# Patient Record
Sex: Female | Born: 1950 | Race: White | Hispanic: No | Marital: Married | Smoking: Never smoker
Health system: Southern US, Community
[De-identification: ages and names within clinical notes are randomized; demographics above are authoritative.]

## PROBLEM LIST (undated history)

## (undated) DIAGNOSIS — K2270 Barrett's esophagus without dysplasia: Secondary | ICD-10-CM

## (undated) DIAGNOSIS — N200 Calculus of kidney: Secondary | ICD-10-CM

## (undated) DIAGNOSIS — F341 Dysthymic disorder: Secondary | ICD-10-CM

## (undated) DIAGNOSIS — K219 Gastro-esophageal reflux disease without esophagitis: Secondary | ICD-10-CM

## (undated) DIAGNOSIS — J8410 Pulmonary fibrosis, unspecified: Secondary | ICD-10-CM

## (undated) DIAGNOSIS — J45909 Unspecified asthma, uncomplicated: Secondary | ICD-10-CM

## (undated) DIAGNOSIS — E7800 Pure hypercholesterolemia, unspecified: Secondary | ICD-10-CM

## (undated) DIAGNOSIS — M810 Age-related osteoporosis without current pathological fracture: Secondary | ICD-10-CM

## (undated) DIAGNOSIS — I10 Essential (primary) hypertension: Secondary | ICD-10-CM

## (undated) DIAGNOSIS — K589 Irritable bowel syndrome without diarrhea: Secondary | ICD-10-CM

## (undated) DIAGNOSIS — R0600 Dyspnea, unspecified: Secondary | ICD-10-CM

## (undated) DIAGNOSIS — R0789 Other chest pain: Secondary | ICD-10-CM

## (undated) DIAGNOSIS — Z9889 Other specified postprocedural states: Secondary | ICD-10-CM

## (undated) DIAGNOSIS — F419 Anxiety disorder, unspecified: Secondary | ICD-10-CM

## (undated) DIAGNOSIS — M3500 Sicca syndrome, unspecified: Secondary | ICD-10-CM

## (undated) DIAGNOSIS — Z8719 Personal history of other diseases of the digestive system: Secondary | ICD-10-CM

## (undated) DIAGNOSIS — M8580 Other specified disorders of bone density and structure, unspecified site: Secondary | ICD-10-CM

## (undated) HISTORY — DX: Anxiety disorder, unspecified: F41.9

## (undated) HISTORY — DX: Other specified postprocedural states: Z98.890

## (undated) HISTORY — PX: CATARACT EXTRACTION: SUR2

## (undated) HISTORY — DX: Pulmonary fibrosis, unspecified: J84.10

## (undated) HISTORY — DX: Dysthymic disorder: F34.1

## (undated) HISTORY — DX: Dyspnea, unspecified: R06.00

## (undated) HISTORY — DX: Gastro-esophageal reflux disease without esophagitis: K21.9

## (undated) HISTORY — PX: FACIAL COSMETIC SURGERY: SHX629

## (undated) HISTORY — DX: Age-related osteoporosis without current pathological fracture: M81.0

## (undated) HISTORY — DX: Personal history of other diseases of the digestive system: Z87.19

## (undated) HISTORY — DX: Essential (primary) hypertension: I10

## (undated) HISTORY — DX: Barrett's esophagus without dysplasia: K22.70

## (undated) HISTORY — DX: Unspecified asthma, uncomplicated: J45.909

## (undated) HISTORY — DX: Sjogren syndrome, unspecified: M35.00

## (undated) HISTORY — DX: Pure hypercholesterolemia, unspecified: E78.00

## (undated) HISTORY — DX: Calculus of kidney: N20.0

## (undated) HISTORY — DX: Other chest pain: R07.89

## (undated) HISTORY — DX: Other specified disorders of bone density and structure, unspecified site: M85.80

## (undated) HISTORY — DX: Irritable bowel syndrome, unspecified: K58.9

---

## 1984-04-20 HISTORY — PX: TUBAL LIGATION: SHX77

## 1993-04-20 HISTORY — PX: OTHER SURGICAL HISTORY: SHX169

## 1999-01-15 ENCOUNTER — Other Ambulatory Visit: Admission: RE | Admit: 1999-01-15 | Discharge: 1999-01-15 | Payer: Self-pay | Admitting: Obstetrics and Gynecology

## 1999-01-15 ENCOUNTER — Encounter (INDEPENDENT_AMBULATORY_CARE_PROVIDER_SITE_OTHER): Payer: Self-pay

## 1999-08-29 ENCOUNTER — Other Ambulatory Visit: Admission: RE | Admit: 1999-08-29 | Discharge: 1999-08-29 | Payer: Self-pay | Admitting: Obstetrics and Gynecology

## 2000-01-16 ENCOUNTER — Other Ambulatory Visit: Admission: RE | Admit: 2000-01-16 | Discharge: 2000-01-16 | Payer: Self-pay | Admitting: Obstetrics and Gynecology

## 2000-01-16 ENCOUNTER — Encounter (INDEPENDENT_AMBULATORY_CARE_PROVIDER_SITE_OTHER): Payer: Self-pay | Admitting: Specialist

## 2000-08-31 ENCOUNTER — Other Ambulatory Visit: Admission: RE | Admit: 2000-08-31 | Discharge: 2000-08-31 | Payer: Self-pay | Admitting: Obstetrics and Gynecology

## 2001-04-29 ENCOUNTER — Encounter: Payer: Self-pay | Admitting: Internal Medicine

## 2001-09-06 ENCOUNTER — Other Ambulatory Visit: Admission: RE | Admit: 2001-09-06 | Discharge: 2001-09-06 | Payer: Self-pay | Admitting: Obstetrics and Gynecology

## 2001-12-15 ENCOUNTER — Observation Stay (HOSPITAL_COMMUNITY): Admission: EM | Admit: 2001-12-15 | Discharge: 2001-12-16 | Payer: Self-pay | Admitting: Emergency Medicine

## 2001-12-15 ENCOUNTER — Encounter: Payer: Self-pay | Admitting: Internal Medicine

## 2003-04-30 ENCOUNTER — Encounter: Payer: Self-pay | Admitting: Internal Medicine

## 2003-04-30 ENCOUNTER — Encounter (INDEPENDENT_AMBULATORY_CARE_PROVIDER_SITE_OTHER): Payer: Self-pay | Admitting: Specialist

## 2003-04-30 ENCOUNTER — Ambulatory Visit (HOSPITAL_COMMUNITY): Admission: RE | Admit: 2003-04-30 | Discharge: 2003-04-30 | Payer: Self-pay | Admitting: Internal Medicine

## 2005-03-17 ENCOUNTER — Ambulatory Visit: Payer: Self-pay | Admitting: Pulmonary Disease

## 2005-08-19 ENCOUNTER — Encounter: Payer: Self-pay | Admitting: Internal Medicine

## 2006-04-08 ENCOUNTER — Ambulatory Visit: Payer: Self-pay | Admitting: Pulmonary Disease

## 2006-11-16 ENCOUNTER — Ambulatory Visit: Payer: Self-pay | Admitting: Pulmonary Disease

## 2006-11-16 LAB — CONVERTED CEMR LAB
ALT: 18 units/L (ref 0–35)
AST: 28 units/L (ref 0–37)
Albumin: 4 g/dL (ref 3.5–5.2)
Alkaline Phosphatase: 38 units/L — ABNORMAL LOW (ref 39–117)
BUN: 14 mg/dL (ref 6–23)
Basophils Absolute: 0 10*3/uL (ref 0.0–0.1)
Basophils Relative: 0.5 % (ref 0.0–1.0)
Bilirubin, Direct: 0.1 mg/dL (ref 0.0–0.3)
CO2: 28 meq/L (ref 19–32)
Calcium: 9.3 mg/dL (ref 8.4–10.5)
Chloride: 104 meq/L (ref 96–112)
Cholesterol: 192 mg/dL (ref 0–200)
Creatinine, Ser: 0.9 mg/dL (ref 0.4–1.2)
Eosinophils Absolute: 0.1 10*3/uL (ref 0.0–0.6)
Eosinophils Relative: 1.4 % (ref 0.0–5.0)
GFR calc Af Amer: 83 mL/min
GFR calc non Af Amer: 69 mL/min
Glucose, Bld: 92 mg/dL (ref 70–99)
HCT: 37.8 % (ref 36.0–46.0)
HDL: 44.9 mg/dL (ref >39.0)
Hemoglobin: 13.2 g/dL (ref 12.0–15.0)
Iron: 104 ug/dL (ref 42–145)
LDL Cholesterol: 131 mg/dL — ABNORMAL HIGH (ref 0–99)
Lymphocytes Relative: 43.8 % (ref 12.0–46.0)
MCHC: 34.9 g/dL (ref 30.0–36.0)
MCV: 90.6 fL (ref 78.0–100.0)
Monocytes Absolute: 0.4 10*3/uL (ref 0.2–0.7)
Monocytes Relative: 9.2 % (ref 3.0–11.0)
Neutro Abs: 1.9 10*3/uL (ref 1.4–7.7)
Neutrophils Relative %: 45.1 % (ref 43.0–77.0)
Platelets: 234 10*3/uL (ref 150–400)
Potassium: 3.9 meq/L (ref 3.5–5.1)
RBC: 4.17 M/uL (ref 3.87–5.11)
RDW: 12.3 % (ref 11.5–14.6)
Saturation Ratios: 24.8 % (ref 20.0–50.0)
Sodium: 140 meq/L (ref 135–145)
TSH: 1.46 microintl units/mL (ref 0.35–5.50)
Total Bilirubin: 0.9 mg/dL (ref 0.3–1.2)
Total CHOL/HDL Ratio: 4.3
Total Protein: 7 g/dL (ref 6.0–8.3)
Transferrin: 299.6 mg/dL (ref >=212.0)
Triglycerides: 83 mg/dL (ref 0–149)
VLDL: 17 mg/dL (ref 0–40)
WBC: 4.3 10*3/uL — ABNORMAL LOW (ref 4.5–10.5)

## 2007-03-14 ENCOUNTER — Telehealth (INDEPENDENT_AMBULATORY_CARE_PROVIDER_SITE_OTHER): Payer: Self-pay | Admitting: *Deleted

## 2007-04-08 DIAGNOSIS — K219 Gastro-esophageal reflux disease without esophagitis: Secondary | ICD-10-CM | POA: Insufficient documentation

## 2007-04-08 DIAGNOSIS — K589 Irritable bowel syndrome without diarrhea: Secondary | ICD-10-CM | POA: Insufficient documentation

## 2007-04-08 DIAGNOSIS — E7800 Pure hypercholesterolemia, unspecified: Secondary | ICD-10-CM | POA: Insufficient documentation

## 2007-04-11 ENCOUNTER — Ambulatory Visit: Payer: Self-pay | Admitting: Pulmonary Disease

## 2007-04-11 DIAGNOSIS — J309 Allergic rhinitis, unspecified: Secondary | ICD-10-CM | POA: Insufficient documentation

## 2007-04-11 DIAGNOSIS — R0602 Shortness of breath: Secondary | ICD-10-CM | POA: Insufficient documentation

## 2007-04-11 DIAGNOSIS — F429 Obsessive-compulsive disorder, unspecified: Secondary | ICD-10-CM | POA: Insufficient documentation

## 2007-04-11 LAB — CONVERTED CEMR LAB: Vit D, 1,25-Dihydroxy: 42 (ref 30–89)

## 2007-04-30 DIAGNOSIS — N200 Calculus of kidney: Secondary | ICD-10-CM | POA: Insufficient documentation

## 2007-04-30 LAB — CONVERTED CEMR LAB
ALT: 15 units/L (ref 0–35)
AST: 22 units/L (ref 0–37)
Albumin: 4 g/dL (ref 3.5–5.2)
Alkaline Phosphatase: 37 units/L — ABNORMAL LOW (ref 39–117)
BUN: 16 mg/dL (ref 6–23)
Basophils Absolute: 0 10*3/uL (ref 0.0–0.1)
Basophils Relative: 0.2 % (ref 0.0–1.0)
Bilirubin, Direct: 0.1 mg/dL (ref 0.0–0.3)
CO2: 31 meq/L (ref 19–32)
Calcium: 9.6 mg/dL (ref 8.4–10.5)
Chloride: 101 meq/L (ref 96–112)
Cholesterol: 176 mg/dL (ref 0–200)
Creatinine, Ser: 0.8 mg/dL (ref 0.4–1.2)
Eosinophils Absolute: 0.1 10*3/uL (ref 0.0–0.6)
Eosinophils Relative: 1.2 % (ref 0.0–5.0)
GFR calc Af Amer: 95 mL/min
GFR calc non Af Amer: 79 mL/min
Glucose, Bld: 106 mg/dL — ABNORMAL HIGH (ref 70–99)
HCT: 37.4 % (ref 36.0–46.0)
HDL: 56.6 mg/dL (ref >39.0)
Hemoglobin: 13 g/dL (ref 12.0–15.0)
LDL Cholesterol: 100 mg/dL — ABNORMAL HIGH (ref 0–99)
Lymphocytes Relative: 46.7 % — ABNORMAL HIGH (ref 12.0–46.0)
MCHC: 34.7 g/dL (ref 30.0–36.0)
MCV: 90.9 fL (ref 78.0–100.0)
Monocytes Absolute: 0.4 10*3/uL (ref 0.2–0.7)
Monocytes Relative: 8.5 % (ref 3.0–11.0)
Neutro Abs: 1.8 10*3/uL (ref 1.4–7.7)
Neutrophils Relative %: 43.4 % (ref 43.0–77.0)
Platelets: 254 10*3/uL (ref 150–400)
Potassium: 4 meq/L (ref 3.5–5.1)
RBC: 4.11 M/uL (ref 3.87–5.11)
RDW: 12.5 % (ref 11.5–14.6)
Sodium: 139 meq/L (ref 135–145)
TSH: 1.25 microintl units/mL (ref 0.35–5.50)
Total Bilirubin: 0.6 mg/dL (ref 0.3–1.2)
Total CHOL/HDL Ratio: 3.1
Total Protein: 7.5 g/dL (ref 6.0–8.3)
Triglycerides: 95 mg/dL (ref 0–149)
VLDL: 19 mg/dL (ref 0–40)
WBC: 4.2 10*3/uL — ABNORMAL LOW (ref 4.5–10.5)

## 2007-09-05 ENCOUNTER — Telehealth (INDEPENDENT_AMBULATORY_CARE_PROVIDER_SITE_OTHER): Payer: Self-pay | Admitting: *Deleted

## 2008-02-13 ENCOUNTER — Telehealth: Payer: Self-pay | Admitting: Internal Medicine

## 2008-02-14 ENCOUNTER — Ambulatory Visit: Payer: Self-pay | Admitting: Internal Medicine

## 2008-02-14 DIAGNOSIS — K5909 Other constipation: Secondary | ICD-10-CM | POA: Insufficient documentation

## 2008-02-14 DIAGNOSIS — K644 Residual hemorrhoidal skin tags: Secondary | ICD-10-CM | POA: Insufficient documentation

## 2008-02-14 DIAGNOSIS — R110 Nausea: Secondary | ICD-10-CM | POA: Insufficient documentation

## 2008-02-14 DIAGNOSIS — R1011 Right upper quadrant pain: Secondary | ICD-10-CM | POA: Insufficient documentation

## 2008-02-14 DIAGNOSIS — K209 Esophagitis, unspecified without bleeding: Secondary | ICD-10-CM | POA: Insufficient documentation

## 2008-02-14 LAB — CONVERTED CEMR LAB
ALT: 16 units/L (ref 0–35)
AST: 26 units/L (ref 0–37)
Albumin: 3.9 g/dL (ref 3.5–5.2)
Alkaline Phosphatase: 45 units/L (ref 39–117)
BUN: 20 mg/dL (ref 6–23)
Basophils Absolute: 0 10*3/uL (ref 0.0–0.1)
Basophils Relative: 0.2 % (ref 0.0–3.0)
CO2: 29 meq/L (ref 19–32)
Calcium: 8.8 mg/dL (ref 8.4–10.5)
Chloride: 106 meq/L (ref 96–112)
Creatinine, Ser: 0.8 mg/dL (ref 0.4–1.2)
Eosinophils Absolute: 0.1 10*3/uL (ref 0.0–0.7)
Eosinophils Relative: 2.5 % (ref 0.0–5.0)
GFR calc Af Amer: 95 mL/min
GFR calc non Af Amer: 79 mL/min
Glucose, Bld: 99 mg/dL (ref 70–99)
HCT: 38.1 % (ref 36.0–46.0)
Hemoglobin: 12.9 g/dL (ref 12.0–15.0)
Lymphocytes Relative: 41.8 % (ref 12.0–46.0)
MCHC: 34 g/dL (ref 30.0–36.0)
MCV: 94 fL (ref 78.0–100.0)
Monocytes Absolute: 0.4 10*3/uL (ref 0.1–1.0)
Monocytes Relative: 10.5 % (ref 3.0–12.0)
Neutro Abs: 1.7 10*3/uL (ref 1.4–7.7)
Neutrophils Relative %: 45 % (ref 43.0–77.0)
Platelets: 258 10*3/uL (ref 150–400)
Potassium: 4 meq/L (ref 3.5–5.1)
RBC: 4.06 M/uL (ref 3.87–5.11)
RDW: 11.9 % (ref 11.5–14.6)
Sodium: 140 meq/L (ref 135–145)
Total Bilirubin: 0.6 mg/dL (ref 0.3–1.2)
Total Protein: 7.2 g/dL (ref 6.0–8.3)
WBC: 3.8 10*3/uL — ABNORMAL LOW (ref 4.5–10.5)

## 2008-02-15 ENCOUNTER — Telehealth (INDEPENDENT_AMBULATORY_CARE_PROVIDER_SITE_OTHER): Payer: Self-pay | Admitting: *Deleted

## 2008-02-15 ENCOUNTER — Ambulatory Visit (HOSPITAL_COMMUNITY): Admission: RE | Admit: 2008-02-15 | Discharge: 2008-02-15 | Payer: Self-pay | Admitting: Internal Medicine

## 2008-02-16 DIAGNOSIS — R109 Unspecified abdominal pain: Secondary | ICD-10-CM | POA: Insufficient documentation

## 2008-02-17 ENCOUNTER — Ambulatory Visit: Payer: Self-pay | Admitting: Internal Medicine

## 2008-02-21 DIAGNOSIS — Z8719 Personal history of other diseases of the digestive system: Secondary | ICD-10-CM | POA: Insufficient documentation

## 2008-02-27 ENCOUNTER — Ambulatory Visit: Payer: Self-pay | Admitting: Internal Medicine

## 2008-02-28 ENCOUNTER — Encounter: Payer: Self-pay | Admitting: Internal Medicine

## 2008-02-28 ENCOUNTER — Ambulatory Visit: Payer: Self-pay | Admitting: Internal Medicine

## 2008-03-01 ENCOUNTER — Encounter: Payer: Self-pay | Admitting: Internal Medicine

## 2008-04-11 ENCOUNTER — Ambulatory Visit: Payer: Self-pay | Admitting: Internal Medicine

## 2008-04-11 LAB — CONVERTED CEMR LAB
Albumin: 4.2 g/dL (ref 3.5–5.2)
BUN: 20 mg/dL (ref 6–23)
Basophils Absolute: 0 10*3/uL (ref 0.0–0.1)
Basophils Relative: 0 % (ref 0.0–3.0)
CO2: 31 meq/L (ref 19–32)
Calcium: 9.4 mg/dL (ref 8.4–10.5)
Chloride: 105 meq/L (ref 96–112)
Creatinine, Ser: 0.7 mg/dL (ref 0.4–1.2)
Eosinophils Absolute: 0 10*3/uL (ref 0.0–0.7)
Eosinophils Relative: 0.8 % (ref 0.0–5.0)
GFR calc Af Amer: 111 mL/min
GFR calc non Af Amer: 91 mL/min
Glucose, Bld: 93 mg/dL (ref 70–99)
HCT: 36.7 % (ref 36.0–46.0)
Hemoglobin: 12.5 g/dL (ref 12.0–15.0)
Lymphocytes Relative: 49.5 % — ABNORMAL HIGH (ref 12.0–46.0)
MCHC: 34.2 g/dL (ref 30.0–36.0)
MCV: 92.5 fL (ref 78.0–100.0)
Monocytes Absolute: 0.4 10*3/uL (ref 0.1–1.0)
Monocytes Relative: 9.6 % (ref 3.0–12.0)
Neutro Abs: 1.7 10*3/uL (ref 1.4–7.7)
Neutrophils Relative %: 40.1 % — ABNORMAL LOW (ref 43.0–77.0)
Phosphorus: 3.7 mg/dL (ref 2.3–4.6)
Platelets: 205 10*3/uL (ref 150–400)
Potassium: 4.2 meq/L (ref 3.5–5.1)
RBC: 3.97 M/uL (ref 3.87–5.11)
RDW: 12.5 % (ref 11.5–14.6)
Sodium: 140 meq/L (ref 135–145)
WBC: 4.3 10*3/uL — ABNORMAL LOW (ref 4.5–10.5)

## 2008-04-16 ENCOUNTER — Ambulatory Visit: Payer: Self-pay | Admitting: Cardiology

## 2008-06-04 ENCOUNTER — Ambulatory Visit: Payer: Self-pay | Admitting: Internal Medicine

## 2008-06-07 ENCOUNTER — Ambulatory Visit: Payer: Self-pay | Admitting: Internal Medicine

## 2008-06-07 ENCOUNTER — Encounter: Payer: Self-pay | Admitting: Internal Medicine

## 2008-06-08 ENCOUNTER — Encounter: Payer: Self-pay | Admitting: Internal Medicine

## 2008-06-08 ENCOUNTER — Telehealth: Payer: Self-pay | Admitting: Internal Medicine

## 2008-08-10 ENCOUNTER — Ambulatory Visit: Payer: Self-pay | Admitting: Cardiology

## 2008-08-10 ENCOUNTER — Inpatient Hospital Stay (HOSPITAL_COMMUNITY): Admission: EM | Admit: 2008-08-10 | Discharge: 2008-08-12 | Payer: Self-pay | Admitting: Emergency Medicine

## 2008-08-13 ENCOUNTER — Telehealth: Payer: Self-pay | Admitting: Cardiology

## 2008-08-13 ENCOUNTER — Telehealth (INDEPENDENT_AMBULATORY_CARE_PROVIDER_SITE_OTHER): Payer: Self-pay | Admitting: *Deleted

## 2008-08-13 ENCOUNTER — Ambulatory Visit: Payer: Self-pay | Admitting: Pulmonary Disease

## 2008-08-14 ENCOUNTER — Ambulatory Visit: Payer: Self-pay

## 2008-08-14 ENCOUNTER — Encounter: Payer: Self-pay | Admitting: Cardiology

## 2008-08-14 DIAGNOSIS — R42 Dizziness and giddiness: Secondary | ICD-10-CM | POA: Insufficient documentation

## 2008-08-20 ENCOUNTER — Telehealth (INDEPENDENT_AMBULATORY_CARE_PROVIDER_SITE_OTHER): Payer: Self-pay | Admitting: *Deleted

## 2008-08-29 ENCOUNTER — Encounter: Payer: Self-pay | Admitting: Pulmonary Disease

## 2008-09-07 ENCOUNTER — Encounter: Payer: Self-pay | Admitting: Cardiology

## 2008-09-14 ENCOUNTER — Telehealth: Payer: Self-pay | Admitting: Internal Medicine

## 2008-09-26 ENCOUNTER — Encounter (INDEPENDENT_AMBULATORY_CARE_PROVIDER_SITE_OTHER): Payer: Self-pay | Admitting: *Deleted

## 2008-10-08 ENCOUNTER — Ambulatory Visit: Payer: Self-pay | Admitting: Internal Medicine

## 2008-10-08 DIAGNOSIS — K2270 Barrett's esophagus without dysplasia: Secondary | ICD-10-CM | POA: Insufficient documentation

## 2008-10-11 ENCOUNTER — Ambulatory Visit (HOSPITAL_COMMUNITY): Admission: RE | Admit: 2008-10-11 | Discharge: 2008-10-11 | Payer: Self-pay | Admitting: Internal Medicine

## 2008-10-16 ENCOUNTER — Encounter: Payer: Self-pay | Admitting: Pulmonary Disease

## 2009-02-04 ENCOUNTER — Telehealth (INDEPENDENT_AMBULATORY_CARE_PROVIDER_SITE_OTHER): Payer: Self-pay | Admitting: *Deleted

## 2009-03-11 ENCOUNTER — Encounter: Payer: Self-pay | Admitting: Pulmonary Disease

## 2009-08-07 ENCOUNTER — Telehealth: Payer: Self-pay | Admitting: Internal Medicine

## 2009-08-07 ENCOUNTER — Ambulatory Visit: Payer: Self-pay | Admitting: Gastroenterology

## 2009-08-07 DIAGNOSIS — K5289 Other specified noninfective gastroenteritis and colitis: Secondary | ICD-10-CM | POA: Insufficient documentation

## 2009-08-07 DIAGNOSIS — K529 Noninfective gastroenteritis and colitis, unspecified: Secondary | ICD-10-CM | POA: Insufficient documentation

## 2009-08-07 DIAGNOSIS — K625 Hemorrhage of anus and rectum: Secondary | ICD-10-CM | POA: Insufficient documentation

## 2009-08-07 DIAGNOSIS — K648 Other hemorrhoids: Secondary | ICD-10-CM | POA: Insufficient documentation

## 2009-08-07 LAB — CONVERTED CEMR LAB
ALT: 24 units/L (ref 0–35)
AST: 30 units/L (ref 0–37)
Albumin: 4.1 g/dL (ref 3.5–5.2)
Alkaline Phosphatase: 47 units/L (ref 39–117)
BUN: 14 mg/dL (ref 6–23)
Basophils Absolute: 0 10*3/uL (ref 0.0–0.1)
Basophils Relative: 0.1 % (ref 0.0–3.0)
CO2: 30 meq/L (ref 19–32)
CRP, High Sensitivity: 2.9 (ref 0.00–5.00)
Calcium: 9.2 mg/dL (ref 8.4–10.5)
Chloride: 104 meq/L (ref 96–112)
Creatinine, Ser: 0.8 mg/dL (ref 0.4–1.2)
Eosinophils Absolute: 0.1 10*3/uL (ref 0.0–0.7)
Eosinophils Relative: 1 % (ref 0.0–5.0)
GFR calc non Af Amer: 77.93 mL/min (ref >60)
Glucose, Bld: 83 mg/dL (ref 70–99)
HCT: 35.9 % — ABNORMAL LOW (ref 36.0–46.0)
Hemoglobin: 12.4 g/dL (ref 12.0–15.0)
Lymphocytes Relative: 44.1 % (ref 12.0–46.0)
Lymphs Abs: 2.4 10*3/uL (ref 0.7–4.0)
MCHC: 34.4 g/dL (ref 30.0–36.0)
MCV: 91 fL (ref 78.0–100.0)
Monocytes Absolute: 0.5 10*3/uL (ref 0.1–1.0)
Monocytes Relative: 9.1 % (ref 3.0–12.0)
Neutro Abs: 2.5 10*3/uL (ref 1.4–7.7)
Neutrophils Relative %: 45.7 % (ref 43.0–77.0)
Platelets: 249 10*3/uL (ref 150.0–400.0)
Potassium: 3.8 meq/L (ref 3.5–5.1)
RBC: 3.94 M/uL (ref 3.87–5.11)
RDW: 13.1 % (ref 11.5–14.6)
Sed Rate: 24 mm/hr — ABNORMAL HIGH (ref 0–22)
Sodium: 142 meq/L (ref 135–145)
Total Bilirubin: 0.4 mg/dL (ref 0.3–1.2)
Total Protein: 7.1 g/dL (ref 6.0–8.3)
WBC: 5.4 10*3/uL (ref 4.5–10.5)

## 2009-09-10 ENCOUNTER — Telehealth (INDEPENDENT_AMBULATORY_CARE_PROVIDER_SITE_OTHER): Payer: Self-pay | Admitting: *Deleted

## 2009-12-19 ENCOUNTER — Ambulatory Visit: Payer: Self-pay | Admitting: Pulmonary Disease

## 2009-12-22 DIAGNOSIS — R0789 Other chest pain: Secondary | ICD-10-CM | POA: Insufficient documentation

## 2009-12-26 LAB — CONVERTED CEMR LAB
ALT: 21 units/L (ref 0–35)
AST: 25 units/L (ref 0–37)
Albumin: 4.2 g/dL (ref 3.5–5.2)
Alkaline Phosphatase: 48 units/L (ref 39–117)
BUN: 16 mg/dL (ref 6–23)
Basophils Absolute: 0 10*3/uL (ref 0.0–0.1)
Basophils Relative: 0.2 % (ref 0.0–3.0)
Bilirubin Urine: NEGATIVE
Bilirubin, Direct: 0.1 mg/dL (ref 0.0–0.3)
CO2: 28 meq/L (ref 19–32)
Calcium: 9.4 mg/dL (ref 8.4–10.5)
Chloride: 104 meq/L (ref 96–112)
Cholesterol: 201 mg/dL — ABNORMAL HIGH (ref 0–200)
Creatinine, Ser: 0.8 mg/dL (ref 0.4–1.2)
Direct LDL: 140.2 mg/dL
Eosinophils Absolute: 0.1 10*3/uL (ref 0.0–0.7)
Eosinophils Relative: 1.6 % (ref 0.0–5.0)
GFR calc non Af Amer: 82.58 mL/min (ref >60)
Glucose, Bld: 88 mg/dL (ref 70–99)
HCT: 37 % (ref 36.0–46.0)
HDL: 49.3 mg/dL (ref >39.00)
Hemoglobin: 12.8 g/dL (ref 12.0–15.0)
Ketones, ur: NEGATIVE mg/dL
Leukocytes, UA: NEGATIVE
Lymphocytes Relative: 48.2 % — ABNORMAL HIGH (ref 12.0–46.0)
Lymphs Abs: 1.8 10*3/uL (ref 0.7–4.0)
MCHC: 34.7 g/dL (ref 30.0–36.0)
MCV: 92.3 fL (ref 78.0–100.0)
Monocytes Absolute: 0.3 10*3/uL (ref 0.1–1.0)
Monocytes Relative: 9.3 % (ref 3.0–12.0)
Neutro Abs: 1.5 10*3/uL (ref 1.4–7.7)
Neutrophils Relative %: 40.7 % — ABNORMAL LOW (ref 43.0–77.0)
Nitrite: NEGATIVE
Platelets: 219 10*3/uL (ref 150.0–400.0)
Potassium: 3.9 meq/L (ref 3.5–5.1)
RBC: 4 M/uL (ref 3.87–5.11)
RDW: 13.5 % (ref 11.5–14.6)
Sodium: 140 meq/L (ref 135–145)
Specific Gravity, Urine: 1.02 (ref 1.000–1.030)
TSH: 1.72 microintl units/mL (ref 0.35–5.50)
Total Bilirubin: 0.7 mg/dL (ref 0.3–1.2)
Total CHOL/HDL Ratio: 4
Total Protein, Urine: NEGATIVE mg/dL
Total Protein: 7.1 g/dL (ref 6.0–8.3)
Triglycerides: 107 mg/dL (ref 0.0–149.0)
Urine Glucose: NEGATIVE mg/dL
Urobilinogen, UA: 0.2 (ref 0.0–1.0)
VLDL: 21.4 mg/dL (ref 0.0–40.0)
WBC: 3.7 10*3/uL — ABNORMAL LOW (ref 4.5–10.5)
pH: 6 (ref 5.0–8.0)

## 2009-12-27 ENCOUNTER — Telehealth (INDEPENDENT_AMBULATORY_CARE_PROVIDER_SITE_OTHER): Payer: Self-pay | Admitting: *Deleted

## 2010-01-07 ENCOUNTER — Telehealth: Payer: Self-pay | Admitting: Internal Medicine

## 2010-02-03 ENCOUNTER — Encounter: Payer: Self-pay | Admitting: Pulmonary Disease

## 2010-02-10 ENCOUNTER — Encounter (INDEPENDENT_AMBULATORY_CARE_PROVIDER_SITE_OTHER): Payer: Self-pay

## 2010-02-12 ENCOUNTER — Ambulatory Visit: Payer: Self-pay | Admitting: Internal Medicine

## 2010-02-19 ENCOUNTER — Ambulatory Visit: Payer: Self-pay | Admitting: Internal Medicine

## 2010-02-20 ENCOUNTER — Encounter: Payer: Self-pay | Admitting: Internal Medicine

## 2010-04-09 ENCOUNTER — Telehealth: Payer: Self-pay | Admitting: Pulmonary Disease

## 2010-04-16 ENCOUNTER — Encounter: Payer: Self-pay | Admitting: Pulmonary Disease

## 2010-05-20 NOTE — Assessment & Plan Note (Signed)
Summary: Cardiology Nuclear Testing  Nuclear Med Background Indications for Stress Test: Evaluation for Ischemia, Post Hospital  Indications Comments: 08/10/08 chest pain/pressure;MI r/o  History: GXT  History Comments: '02 GXT:(-)  Symptoms: Chest Pressure, Chest Pressure with Exertion, Diaphoresis, Dizziness, Fatigue, Fatigue with Exertion, Light-Headedness, Nausea, Palpitations, Rapid HR  Symptoms Comments: anxiety   Nuclear Pre-Procedure Cardiac Risk Factors: Family History - CAD, Lipids Caffeine/Decaff Intake: none NPO After: 8:30 PM Lungs: clear IV 0.9% NS with Angio Cath: 22g     IV Site: (R) hand IV Started by: Irean Hong RN Chest Size (in) 36     Cup Size B     Height (in): 64 Weight (lb): 125 BMI: 21.53  Nuclear Med Study 1 or 2 day study:  1 day     Stress Test Type:  Adenosine Reading MD:  Charlton Haws, MD     Referring MD:  Olga Millers, MD Resting Radionuclide:  Technetium 4m Tetrofosmin     Resting Radionuclide Dose:  11.0 mCi  Stress Radionuclide:  Technetium 77m Tetrofosmin     Stress Radionuclide Dose:  33.0 mCi   Stress Protocol      Max HR:  89 bpm Max Systolic BP: 112 mm HgRate Pressure Product:  9968 Dose of Adenosine:  31.8 mg    Stress Test Technologist:  Rea [employer] CMA-N     Nuclear Technologist:  Domenic Polite CNMT  Rest Procedure  Myocardial perfusion imaging was performed at rest 45 minutes following the intraveneous administration of Myoview Technetium 72m Tetrofosmin.  Stress Procedure  The patient received IV adenosine at 140 mcg/kg/min for 4 minutes. There was a brief episode of 2nd degree AVB with infusion. She did c/o chest tightness with infusion.  Myoview was injected at the 2 minute mark and quantitative spect images were obtained after a 45 minute delay.  QPS Raw Data Images:  Normal; no motion artifact; normal heart/lung ratio. Stress Images:  NI: Uniform and normal uptake of tracer in all myocardial segments. Rest  Images:  Normal homogeneous uptake in all areas of the myocardium. Subtraction (SDS):  Normal Transient Ischemic Dilatation:  .95  (Normal <1.22)  Lung/Heart Ratio:  .25  (Normal <0.45)  Quantitative Gated Spect Images QGS EDV:  45 ml QGS ESV:  17 ml QGS EF:  64 % QGS cine images:  Normal  Findings Normal nuclear study      Overall Impression  Exercise Capacity: Adenosine study with no exercise. BP Response: Normal blood pressure response. Clinical Symptoms: Throat tightness ECG Impression: No significant ST segment change suggestive of ischemia. Overall Impression: Normal stress nuclear study. Overall Impression Comments: Normal

## 2010-05-20 NOTE — Miscellaneous (Signed)
Summary: New RX  Clinical Lists Changes  Medications: Added new medication of AMITIZA 8 MCG  CAPS (LUBIPROSTONE) 1 two times a day/take with food and water - Signed Rx of AMITIZA 8 MCG  CAPS (LUBIPROSTONE) 1 two times a day/take with food and water;  #60 x 1;  Signed;  Entered by: Burman Foster RN;  Authorized by: Hart Carwin MD;  Method used: Electronically to [facility] [address] [place]. [phone]*, [address], [place], [place], [place]  [address], Ph: [phone] or [phone], Fax: [phone]    Prescriptions: AMITIZA 8 MCG  CAPS (LUBIPROSTONE) 1 two times a day/take with food and water  #60 x 1   Entered by:   Burman Foster RN   Authorized by:   Hart Carwin MD   Signed by:   Burman Foster RN on 06/07/2008   Method used:   Electronically to        [facility] [address] [phone]* (retail)       [address]       [place], [place]  [address]       Ph: [phone] or [phone]       Fax: [phone]   RxID:   [id number]

## 2010-05-20 NOTE — Miscellaneous (Signed)
Summary: [facility]  PREVISIT/PREP  Clinical Lists Changes  Medications: Added new medication of DULCOLAX 5 MG  TBEC (BISACODYL) Day before procedure take 2 at 3pm and 2 at 8pm. - Signed Added new medication of METOCLOPRAMIDE HCL 10 MG  TABS (METOCLOPRAMIDE HCL) As per prep instructions. - Signed Added new medication of MIRALAX   POWD (POLYETHYLENE GLYCOL 3350) As per prep  instructions. - Signed Rx of DULCOLAX 5 MG  TBEC (BISACODYL) Day before procedure take 2 at 3pm and 2 at 8pm.;  #4 x 0;  Signed;  Entered by: Jennye Boroughs RN;  Authorized by: Hart Carwin MD;  Method used: Electronically to [facility] [address] [place]. [phone]*, [address], [place], [place], [place]  [address], Ph: [phone] or [phone], Fax: [phone] Rx of METOCLOPRAMIDE HCL 10 MG  TABS (METOCLOPRAMIDE HCL) As per prep instructions.;  #2 x 0;  Signed;  Entered by: Jennye Boroughs RN;  Authorized by: Hart Carwin MD;  Method used: Electronically to [facility] [address] [place]. [phone]*, [address] [place], [place], [place]  [address], Ph: [phone] or [phone], Fax: [phone] Rx of MIRALAX   POWD (POLYETHYLENE GLYCOL 3350) As per prep  instructions.;  #255gm x 0;  Signed;  Entered by: Jennye Boroughs RN;  Authorized by: Hart Carwin MD;  Method used: Electronically to [facility] [address] [place]. [phone]*, [address] [place], [place], [place]  [address], Ph: [phone] or [phone], Fax: [phone]    Prescriptions: MIRALAX   POWD (POLYETHYLENE GLYCOL 3350) As per prep  instructions.  #255gm x 0   Entered by:   Jennye Boroughs RN   Authorized by:   Hart Carwin MD   Signed by:   Jennye Boroughs RN on 06/04/2008   Method used:   Electronically to        [facility]. [address] [phone]* (retail)       [address]       [place], [place]  [address]       Ph: [phone] or [phone]       Fax: [phone]   RxID:   [id number] METOCLOPRAMIDE HCL 10 MG   TABS (METOCLOPRAMIDE HCL) As per prep instructions.  #2 x 0   Entered by:   Jennye Boroughs RN   Authorized by:   Hart Carwin MD   Signed by:   Jennye Boroughs RN on 06/04/2008   Method used:   Electronically to        [facility]. [address] [phone]* (retail)       [address]       [place], [place]  [address]       Ph: [phone] or [phone]       Fax: [phone]   RxID:   [id number] DULCOLAX 5 MG  TBEC (BISACODYL) Day before procedure take 2 at 3pm and 2 at 8pm.  #4 x 0   Entered by:   Jennye Boroughs RN   Authorized by:   Hart Carwin MD   Signed by:   Jennye Boroughs RN on 06/04/2008   Method used:   Electronically to        [facility] [address] [phone]* (retail)       [address]       [place], [place]  [address]  Ph: [phone] or [phone]       Fax: [phone]   RxID:   [id number]

## 2010-05-20 NOTE — Procedures (Signed)
Summary: [facility] FLEX SIG 04/30/2003   Flexible Sigmoidoscopy  Procedure date:  04/30/2003  Comments:      Location: [facility].   Patient Name: Sheri Andrews, Sheri Andrews. MRN: [id number] Procedure Procedures: Flexible Proctosigmoidoscopy CPT: 45330.    with biopsy. CPT: 45331.  Personnel: Endoscopist: Vear Staton L. Juanda Chance, MD.  Exam Location: Exam performed in Endoscopy Suite.  Patient Consent: Procedure, Alternatives, Risks and Benefits discussed, consent obtained,  Indications Symptoms: Hematochezia.  Surveillance of: 2003.  Increased Risk Screening: ischemic colitis 2003.  History  Current Medications: Patient is not currently taking Coumadin.  Pre-Exam Physical: Performed Apr 30, 2003. Cardio-pulmonary exam  WNL. Rectal exam, HEENT exam , Abdominal exam, Extremity exam, Neurological exam, Mental status exam WNL.  Exam Exam: Extent visualized: Splenic Flexure. Extent of exam: 70 cm. Colon retroflexion performed. Images taken. ASA Classification: I. Tolerance: good.  Monitoring: Pulse and BP monitoring, Oximetry used. Supplemental O2 given.  Colon Prep Used Fleets enema for colon prep. Prep: good.  Fluoroscopy: Fluoroscopy was not used.  Sedation Meds: Fentanyl 60 given IV. Versed 7 mg. given IV.  Findings NORMAL EXAM: Descending Colon. Comments: no diverticulosis.  MISCELLANEOUS COLITIS: Sigmoid Colon to Rectum. Erosions absent, granular absent, bleeding absent, Haustral folds diminished. Friability: mild. Activity level mild, Biopsy/Misc Colitis taken. ICD9: Colitis, Unspecified: 558.9. Comments: nonspecific reticulated pattern, mild melanosis coli.   Assessment Abnormal examination, see findings above.  Diagnoses: 558.9: Colitis, Unspecified.   Comments: mild mucosal changes from rectum to 20cm, s/p biopsies Events  Unplanned Intervention: No intervention was required.  Unplanned Events: There were no complications. Plans Medication Plan: Await  pathology. Colace: 100 QD, starting Apr 30, 2003   Comments: increase fluid intake, if Sx's recur, consider CT angio Disposition: After procedure patient sent to recovery.   cc: Dr Tracey Harries  This report was created from the original endoscopy report, which was reviewed and signed by the above listed endoscopist.

## 2010-05-20 NOTE — Assessment & Plan Note (Signed)
Summary: 3 DAYS OF BLOODY DIARRHEA, PAIN, NAUSEA   (DR.BRODIE PT.)   D...   History of Present Illness Visit Type: Follow-up Visit Primary GI MD: Lina Sar MD Primary Provider: Alroy Dust, MD Requesting Provider: n/a Chief Complaint: Bloody diarrhea, lower abd pain with cramping, bloating, and nausea with vomiting  History of Present Illness:   Patient is followed by Dr. Juanda Chance for history of ischemic colitis and Barrett's. Sunday night developed belching, nausea, vomiting, rumbling stomach, diarrhea, and mid abdominal pain. The following day she had 3-4 episodes of hematochezia. Blood loss not as much as when she had ischemic colitis. Takes Prempro. No recent decongestants or other known vasoconstrictors. No recent dehydration. Complains of exhaustion since episode but otherwise her symptoms have resolved.    Patient concerned about "floating" stools. She is concerned about celiac disease.    GI Review of Systems    Reports abdominal pain, bloating, nausea, and  vomiting.     Location of  Abdominal pain: lower abdomen.    Denies acid reflux, belching, chest pain, dysphagia with liquids, dysphagia with solids, heartburn, loss of appetite, vomiting blood, weight loss, and  weight gain.      Reports diarrhea.     Denies anal fissure, black tarry stools, change in bowel habit, constipation, diverticulosis, fecal incontinence, heme positive stool, hemorrhoids, irritable bowel syndrome, jaundice, light color stool, liver problems, rectal bleeding, and  rectal pain.    Current Medications (verified): 1)  Adult Aspirin Ec Low Strength 81 Mg  Tbec (Aspirin) .... Take 1 Tablet By Mouth Once A Day 2)  Simvastatin 20 Mg  Tabs (Simvastatin) .Marland Kitchen.. 1 Tab By Mouth At Bedtime 3)  Calcium 600 1500 Mg Tabs (Calcium Carbonate) .Marland Kitchen.. 1 Tablet By Mouth Once Daily 4)  Cvs Magnesium 250 Mg Tabs (Magnesium) .Marland Kitchen.. 1 Tablet By Mouth Once Daily 5)  Clonazepam Odt 0.25 Mg Tbdp (Clonazepam) .Marland Kitchen.. 1 Tablet By Mouth As  Needed 6)  Protonix 40 Mg Tbec (Pantoprazole Sodium) .... Take 1 Tablet By Mouth Two Times A Day 7)  Zyrtec Allergy 10 Mg Tabs (Cetirizine Hcl) .... One Tablet By Mouth Once Daily 8)  Celexa 40 Mg Tabs (Citalopram Hydrobromide) .... One Tablet By Mouth Once Daily 9)  Anusol-Hc 25 Mg Supp (Hydrocortisone Acetate) .... Insert 1 Suppository Into Rectum Two Times A Day X 7 Days 10)  Prempro 0.625-2.5 Mg Tabs (Conj Estrog-Medroxyprogest Ace) .... Take 1 Tab Daily (.025 Mg)  Allergies (verified): 1)  ! Wellbutrin (Bupropion Hcl) 2)  Erythromycin Ethylsuccinate 3)  Doxycycline Hyclate (Doxycycline Hyclate) 4)  * Nulev (Hyoscyamine Sulfate)  Past History:  Past Medical History: PHYSICAL EXAMINATION (ICD-V70.0) - she is up-to-date w/ GYN= DrHenley... ALLERGIC RHINITIS (ICD-477.9) - prev allergy eval by DrESL... she's been on allergy shots for 26 yrs + venom shots etc... Hx of DYSPNEA (ICD-786.05) - she had a neg cardiac eval by DrBBrodie in 2002. HYPERCHOLESTEROLEMIA (ICD-272.0) - on SIMVASTATIN 20 w/ last FLP 7/08 showing TChol192, TG 83, HDL 45, LDL 131... ACID REFLUX DISEASE (ICD-530.81) IRRITABLE BOWEL SYNDROME (ICD-564.1) - last colonoscopy 1/05 by DrDBrodie showed ?mild colitis, no polyps, no divertics. 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 of medullary sponge kidney... 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 Tubal  Ligation 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  Family History: Reviewed history from 09/05/2008 and no changes required. Father (Dr. Kenn File) died age 45 with heart disease & had prev CABG Mother died age 4 with heart disease & had CABG, DM, renal  failure 4 Sibs- 3 Bro & 1 Sis... all a/w No FH of Colon Cancer: Family History of Coronary Artery Disease:   Social History: Reviewed history from 10/08/2008 and no changes required. Part Time Work Non-smoker Social Etoh Alcohol Use - yes - 6 drinks per week Daily Caffeine Use: Ice tea Patient does not get regular exercise.   Review of Systems       The patient complains of back pain and fatigue.  The patient denies allergy/sinus, anemia, anxiety-new, arthritis/joint pain, blood in urine, breast changes/lumps, change in vision, confusion, cough, coughing up blood, depression-new, fainting, fever, headaches-new, hearing problems, heart murmur, heart rhythm changes, itching, menstrual pain, muscle pains/cramps, night sweats, nosebleeds, pregnancy symptoms, shortness of breath, skin rash, sleeping problems, sore throat, swelling of feet/legs, swollen lymph glands, thirst - excessive , urination - excessive , urination changes/pain, urine leakage, vision changes, and voice change.    Vital Signs:  Patient profile:   60 year old female Height:      64 inches Weight:      133 pounds BMI:     22.91 BSA:     1.65 Pulse rate:   88 / minute Pulse rhythm:   regular BP sitting:   126 / 74  (left arm) Cuff size:   regular  Vitals Entered By: Ok Anis CMA (August 07, 2009 2:18 PM)  Physical Exam  General:  Well developed, well nourished, no acute distress. Head:  Normocephalic and atraumatic. Eyes:  Conjunctiva pink, no icterus.  Mouth:  No oral lesions. Tongue moist.  Neck:  no obvious masses  Lungs:  Clear throughout to auscultation. Heart:  Regular rate and rhythm; no murmurs, rubs,  or bruits. Abdomen:  Abdomen soft, nontender, nondistended. No obvious masses or hepatomegaly.Normal bowel sounds.  Rectal:  There appears to have a superficial posterior midline fissure. On anoscopy there are internal hemorrhoids seen. Stool heme negative.  Msk:  Symmetrical with no gross  deformities. Normal posture. Neurologic:  Alert and  oriented x4;  grossly normal neurologically. Skin:  Intact without significant lesions or rashes. Psych:  Alert and cooperative. Normal mood and affect.   Impression & Recommendations:  Problem # 1:  GASTROENTERITIS (ICD-558.9) Assessment New Recent nausea, vomiting, mid abdominal pain and diarrhea - resolved. Symptoms were followed by hematochezia which I think was perianal in nature (see #2). Will check some basic labs for leukocytosis, anemia, electrolytes abnormalites.   Problem # 2:  RECTAL BLEEDING (ICD-569.3) Assessment: New Possibly a small fissure on exam. Additionally, internal hemorrhoids on anoscopy. I don't feel her recent illness was secondary to ischemic colitis, despite her history of that. Will treat with Anusol HC suppositories. If any recurrent abdominal pain, vomiting, diarrhea, or if bleeding recurs, patient will call our office immediately. Her last colonoscopy was in Feb. 2010 and revealed internal hemorrhoids and inflammation of  the rectum.  Rectal biopsies showed questionable mild nonspecific proctitis.  Problem # 3:  HEMORRHOIDS-INTERNAL (ICD-455.0) Assessment: Comment Only See #2.  Problem # 4:  HYPERCHOLESTEROLEMIA (ICD-272.0) Assessment: Comment Only On statin.  Other Orders: TLB-CBC Platelet - w/Differential (85025-CBCD) TLB-CMP (Comprehensive Metabolic Pnl) (80053-COMP) TLB-Sedimentation Rate (ESR) (85652-ESR) TLB-CRP-High Sensitivity (C-Reactive Protein) (86140-FCRP)  Patient Instructions: 1)  Please go to  the basement to have your labs drawn today before you leave. 2)  Please take Align 1 capsule daily x 14 days. You have been given samples of this. 3)  Please pick up your prescription for anusol suppositories at the pharmacy. We have already sent that prescription electronically. 4)  Follow a low residue diet x 7 days 5)  Call our office IMMEDIATELY should you develop any recurrent abdominal  pain, nausea, vomiting or bleeding. 6)  Copy sent to : Dr Alroy Dust, Dr Lina Sar 7)  The medication list was reviewed and reconciled.  All changed / newly prescribed medications were explained.  A complete medication list was provided to the patient / caregiver. Prescriptions: ANUSOL-HC 25 MG SUPP (HYDROCORTISONE ACETATE) Insert 1 suppository into rectum two times a day x 7 days  #14 x 0   Entered by:   Lamona Curl CMA (AAMA)   Authorized by:   Willette Cluster NP   Signed by:   Lamona Curl CMA (AAMA) on 08/07/2009   Method used:   Electronically to        [facility]. [address] [phone]* (retail)       [address]       [place], [place]  [address]       Ph: [phone] or [phone]       Fax: [phone]   RxID:   [id number]

## 2010-05-20 NOTE — Progress Notes (Signed)
Summary: Nuc pre procedure  Phone Note Outgoing Call Call back at [facility] Phone [phone]   Call placed by: Rea [employer], CMA,  August 13, 2008 5:14 PM Call placed to: Patient Summary of Call: Reviewed information on Myoview Information Sheet (see scanned document for further details). Sheri Andrews spoke with patient.        Nuclear Med Background Indications for Stress Test: Evaluation for Ischemia, Post Hospital  Indications Comments: 08/10/08 chest pain/pressure;MI r/o  History: GXT  History Comments: '02 GXT:(-)  Symptoms: Chest Pressure, Diaphoresis, Dizziness, Nausea  Symptoms Comments: anxiety   Nuclear Pre-Procedure Cardiac Risk Factors: Family History - CAD, Lipids Height (in): 64

## 2010-05-20 NOTE — Progress Notes (Signed)
Summary: TRIAGE-ABD PAIN   Phone Note Call from Patient Call back at [phone]    Call For: DR Abel Hageman Reason for Call: Talk to Nurse Summary of Call: HAS NAUSEA, ABD PAIN ALOT OF BELCHING WHICH IS NOT NORMAL FOR HER. pAIN UNDER LEFT SHOULDER. LOOKED IT UP ONLINE AND THINKS IT MIGHT BE HER GALLBLADDER. CALLING TO GET APPT TO BE SEEN RIGHT AWAY. Initial call taken by: Leanor Kail [facility],  February 13, 2008 9:22 AM  Follow-up for Phone Call        [place] left for patient to callback. Laureen Ochs LPN  February 13, 2008 10:21 AM   Follow-up by: Laureen Ochs LPN,  February 13, 2008 10:30 AM  Additional Follow-up for Phone Call Additional follow up Details #1::         X 2 monthes--Pt. c/o epigastric pressure and pain, RUQ pain, Right shoulder pain, indigestion and increasing nausea. Increasing belching. Takes Achipex daily.  1) See Gunnar Fusi tomorrow at 9:30am 2) If symptoms become worse call back immediately or go to ER.  Additional Follow-up by: Laureen Ochs LPN,  February 13, 2008 10:36 AM

## 2010-05-20 NOTE — Progress Notes (Signed)
Summary: rx refill request  Phone Note Call from Patient   Caller: Patient Call For: nadel Summary of Call: pt has made an appt for a physical- scheduled for 12/19/09. she is requesting to have rx refill for simvastatin- 3 months supply until then. [facility] [address] pt # [id number] Initial call taken by: Tivis Ringer, CNA,  Sep 10, 2009 2:27 PM  Follow-up for Phone Call        Spoke with pt and advised that rx for 90 day supply of simvastatin was sent to pharm.  She will keep planned rov for refills. Follow-up by: Vernie Murders,  Sep 10, 2009 2:34 PM    Prescriptions: SIMVASTATIN 20 MG  TABS (SIMVASTATIN) 1 tab by mouth at bedtime  #90 x 0   Entered by:   Vernie Murders   Authorized by:   Michele Mcalpine MD   Signed by:   Vernie Murders on 09/10/2009   Method used:   Electronically to        [facility] [address] [phone]* (retail)       [address]       [place], [place]  [address]       Ph: [phone] or [phone]       Fax: [phone]   RxID:   [id number]

## 2010-05-20 NOTE — Assessment & Plan Note (Signed)
Summary: X2 MONTHES-EPIGASTRIC, RUQ PAIN-BECOMMING WORSE     Sheri Andrews   History of Present Illness Visit Type: self referral Primary GI MD: Lina Sar MD Primary Provider: Alroy Dust, MD Chief Complaint: Epigastric/RUQ pain, hiatal hernea, nausea, headache, Belching/bloating, constipation History of Present Illness:   Sheri Andrews , a pleasant 61 year old white female, is worked in today for a two month history of RUQ pain and pressure. The pressure has been constant but the pain is intermittent. Symptoms not related to meals. Pain wakes patient up at night. Involved areas include mid to distal sternum and right rib cage to about the area of midclavicular line. The patient has right mid back pain but implies that pain is muscular in nature. Pain not pleuritic. Patient has a chronic cough she attributes to allergies. Over the weekend the patient had right breast pain and became scared that her chest and rib pain could be from her right breast. She hasn't felt any breast masses or had any discharge. She is do for her mammogram next week. No recent trauma. No new exercise routines or repetitive movements. The patient has had several weeks of belching and some nausea without vomiting.  She has a history of GERD managed with Aciphex.  She is up to date on her CRC screening. Sheri Andrews has chronic constipation for which she takes stool softeners. She tried some Miralax recently but it caused dark, sticky stools (not black). She sees Dr. Juanda Chance for her GI care.   GI Review of Systems    Reports abdominal pain, acid reflux, belching, chest pain, and  nausea.     Location of  Abdominal pain: RUQ.    Denies bloating, dysphagia with liquids, dysphagia with solids, heartburn, loss of appetite, vomiting, vomiting blood, weight loss, and  weight gain.      Reports constipation.     Denies anal fissure, black tarry stools, change in bowel habit, diarrhea, diverticulosis, fecal incontinence, heme positive stool,  hemorrhoids, irritable bowel syndrome, jaundice, light color stool, liver problems, rectal bleeding, and  rectal pain.     Prior Medications Reviewed Using: Medication Bottles  Updated Prior Medication List: ADULT ASPIRIN EC LOW STRENGTH 81 MG  TBEC (ASPIRIN) Take 1 tablet by mouth once a day SIMVASTATIN 20 MG  TABS (SIMVASTATIN) 1 tab by mouth at bedtime ACIPHEX 20 MG TBEC (RABEPRAZOLE SODIUM) Take 1 tablet by mouth once a day AMBIEN CR 12.5 MG TBCR (ZOLPIDEM TARTRATE) take one tablet by mouth at bedtime as needed PREMPRO 0.45-1.5 MG  TABS (CONJ ESTROG-MEDROXYPROGEST ACE) Take 1 tablet by mouth once a day CALCIUM 600 1500 MG TABS (CALCIUM CARBONATE) 1 tablet by mouth once daily [facility] MAGNESIUM 250 MG TABS (MAGNESIUM) 1 tablet by mouth once daily CITALOPRAM HYDROBROMIDE 40 MG TABS (CITALOPRAM HYDROBROMIDE) 1 tablet by mouth once daily CLONAZEPAM ODT 0.25 MG TBDP (CLONAZEPAM) 1 tablet by mouth as needed  Current Allergies (reviewed today): ! WELLBUTRIN (BUPROPION HCL) ERYTHROMYCIN ETHYLSUCCINATE DOXYCYCLINE HYCLATE (DOXYCYCLINE HYCLATE) * NULEV (HYOSCYAMINE SULFATE)  Past Medical History:    Reviewed history from 04/11/2007 and no changes required:       Current Problems:               ALLERGIC RHINITIS (ICD-477.9)       Hx of DYSPNEA (ICD-786.05)       HYPERCHOLESTEROLEMIA (ICD-272.0)       ACID REFLUX DISEASE (ICD-530.81)       IRRITABLE BOWEL SYNDROME (ICD-564.1)       Hx of NEPHROLITHIASIS (ICD-592.0)  OSTEOPOROSIS (ICD-733.00)       DYSTHYMIA (ICD-300.4)       ANEMIA, MILD (ICD-285.9)         Past Surgical History:    Tubal Ligation    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   Social History:    Non-smoker    Social Etoh    Alcohol Use - yes - 6 drinks per week    Daily Caffeine Use: Ice tea   Risk Factors:  Alcohol use:  yes   Review of Systems       The patient complains of allergy/sinus, fatigue, and sleeping problems.  The patient denies anemia,  anxiety-new, arthritis/joint pain, back pain, blood in urine, breast changes/lumps, change in vision, confusion, cough, coughing up blood, depression-new, fainting, fever, headaches-new, hearing problems, heart murmur, heart rhythm changes, itching, menstrual pain, muscle pains/cramps, night sweats, nosebleeds, pregnancy symptoms, shortness of breath, skin rash, sore throat, swelling of feet/legs, swollen lymph glands, thirst - excessive , urination - excessive , urination changes/pain, urine leakage, vision changes, and voice change.     Vital Signs:  Patient Profile:   60 Years Old Female Height:     64 inches Weight:      127.25 pounds BMI:     21.92 BSA:     1.62 Pulse rate:   80 / minute Pulse rhythm:   regular BP sitting:   126 / 75  (left arm)  Vitals Entered By: Sheri Andrews CMA (February 14, 2008 9:30 AM)                  Physical Exam  General:     Well developed, well nourished, no acute distress. Head:     Normocephalic and atraumatic. Eyes:     PERRLA, no icterus. Mouth:     No deformity or lesions, dentition normal. Neck:     Supple; no masses or thyromegaly. Breasts:     Limited examination of right breast did not reveal any obvious deformities, masses Lungs:     Clear throughout to auscultation. No rubs. Heart:     Regular rate and rhythm; no murmurs, rubs,  or bruits. Rectal:     Trace of stool in rectal vault which was light brown and heme negative. No masses felt. One external inflamed hemorrhoid seen. Msk:     Symmetrical with no gross deformities. Normal posture. Extremities:     No clubbing, cyanosis, edema or deformities noted. Neurologic:     Alert and  oriented x4;  grossly normal neurologically. Skin:     Intact without significant lesions or rashes. Psych:     Alert and cooperative. Normal mood and affect.    Impression & Recommendations:  Problem # 1:  CHEST WALL PAIN, ACUTE (ICD-786.52) Pain / tenderness of anterior right  ribcage and mid to distal sternum. Pain not pleuritic and is reproducible with palpation. Pain felt to be more musculoskeletal in origin.   Problem # 2:  NAUSEA (ICD-787.02) Associated with excessive belching and started about the same time as chest wall pain.  May be be secondary to anxiety related to #1. Although the right ribcage pain seems by history and on exam to be musculoskeletal in origin, given her nausea will obtain a RUQ U/S and labs. Patient will follow up with Dr. Juanda Chance in 2-3 weeks if workup suggests any GI problems.. In the meantime we will await the patient's test results and notify her of any immediate changes in her treatment plan.If the workup  is negative for GI problems, and return to Dr.Nadel for further assessment and treatment of musculoskeletal pain.  Problem # 3:  ACID REFLUX DISEASE (ICD-530.81) Symptoms controlled with Aciphex. No Barrett's on EGD in 2003. Continue PPI.  Problem # 4:  HEMORRHOIDS-EXTERNAL (ICD-455.3) Assessment: New Mildly inflamed. Reducible. Offered steroid suppository but patient feels it isn't necessary at this time as there is no discomfort.    Problem # 5:  CONSTIPATION, CHRONIC (ICD-564.09) MiraLax p.r.n.   Patient Instructions: 1)  Please go to our lab before leaving today. 2)  Ultrasound scheduled @ [facility] for 02-15-08 @ 8AM, you should arrive @ 7:30AM.   3)  Take Miralax once daily, either in the Am or at bedtime.    ]

## 2010-05-20 NOTE — Progress Notes (Signed)
Summary: ent referral  Phone Note Call from Patient Call back at [phone]   Caller: Patient Call For: nadel Reason for Call: Talk to Nurse, Referral Summary of Call: Patient calling asking for a referral to dr. Stasia Cavalier-.  Patient saw TP last week for dizziness and sinus pressure.  Dizziness is better, but she still has a lot of pressure.  Called to make an appointment w/Dr. Annalee Genta, but since it has been so long since she has seen him their office needs her to be referred by her primary doctor. Initial call taken by: melanie hatley  Follow-up for Phone Call        referral in EMR per SN.  LMOM to inform pt the referral has been sent. Follow-up by: Boone Master CNA,  Aug 20, 2008 1:13 PM

## 2010-05-20 NOTE — Medication Information (Signed)
Summary: [facility] Clarification/[facility]  [facility] Clarification[facility]   Imported By: Sherian Rein 02/10/2010 07:18:41  _____________________________________________________________________  External Attachment:    Type:   Image     Comment:   External Document

## 2010-05-20 NOTE — Progress Notes (Signed)
Summary: Triage-bloody diarrhea, pain, nausea  Phone Note Call from Patient Call back at [phone]   Caller: Patient Call For: Dr. Juanda Chance Reason for Call: Talk to Nurse Summary of Call: extreme diarrhea became blood, vomiting, bloated, abd cramps... pt thinks she has Celiac's Disease... wants to be worked in to be seen today or tomorrow... aware Dr. Juanda Chance is out of the office the rest of the week Initial call taken by: Vallarie Mare,  August 07, 2009 8:32 AM  Follow-up for Phone Call        Since Sunday she has had bloody diarrhea, abd. pain and nausea.  states she has these episodes 1-2X a year, the last one was 1 month ago. Pt. will see Willette Cluster NP today at 2pm.  Follow-up by: Laureen Ochs LPN,  August 07, 2009 9:45 AM

## 2010-05-20 NOTE — Miscellaneous (Signed)
Summary: [facility]  Clinical Lists Changes  Observations: Added new observation of ALLERGY REV: Done (02/12/2010 16:35)

## 2010-05-20 NOTE — Letter (Signed)
Summary: Patient Notice-[facility]  [address] [place], [place] [address]   Phone: [phone]  Fax: [phone]        February 20, 2010 MRN: [id number]    [facility] [address] [place], [place]  [address]    Dear Ms. Razavi,  I am pleased to inform you that the biopsies taken during your recent endoscopic examination did not show any evidence of cancer upon pathologic examination.  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 While not cancer, it is pre-cancerous (can progress to cancer) and needs to be monitored with repeat endoscopic examination and biopsies.  Fortunately, it is quite rare that this develops into cancer, but careful monitoring of the condition along with taking your medication as prescribed is important in reducing the risk of developing cancer.  It is my recommendation that you have a repeat upper gastrointestinal endoscopic examination in 2_ years.  Additional information/recommendations:  __Please call [phone] to schedule a return visit to further      evaluate your condition.  x__Continue with treatment plan as outlined the day of your exam.  Please call us if you have or develop heartburn, reflux symptoms, any swallowing problems, or if you have questions about your condition that have not been fully answered at this time.  Sincerely,  Hart Carwin MD  This letter has been electronically signed by your physician.  Appended Document: Patient Notice-[facility] letter mailed

## 2010-05-20 NOTE — Progress Notes (Signed)
Summary: STRESS TEST TOMORROW/STILL VERY DIZZY  Phone Note Call from Patient Call back at Home Phone [phone]   Caller: Patient Reason for Call: Talk to Nurse Summary of Call: SCHEDULED FOR STRESS TEST TOMORROW.Marland KitchenMarland KitchenSEEN IN ED OVER WEEKEND.  STILL VERY DIZZY WITH LIMITED MOVEMENT.  WHAT SHOULD SHE DO? Initial call taken by: Burnard Leigh,  August 13, 2008 9:23 AM  Follow-up for Phone Call        spoke with pt, she was seen in ed and cont to have dizziness when turning her head or sitting up. Discussed with nuc. med, pt instructed to give medical md a call for poss vertigo. She is ok to come for nuc test tomorrow. Deliah Goody, RN  August 13, 2008 11:22 AM

## 2010-05-20 NOTE — Assessment & Plan Note (Signed)
Summary: appt @ 12:15/eph.   Allergies: 1)  ! Wellbutrin (Bupropion Hcl) 2)  Erythromycin Ethylsuccinate 3)  Doxycycline Hyclate (Doxycycline Hyclate) 4)  * Nulev (Hyoscyamine Sulfate)

## 2010-05-20 NOTE — Assessment & Plan Note (Signed)
Summary: physical/lc   Chief Complaint:  Yearly CPX....  History of Present Illness: 60 y/o WF here for a yearly physical exam and review of medical issues... she has 2 concerns today: the first is a lingering sinus infection... she has severe allergies and recent incr in nasal congestion, drainage (clear, sl beige) and discomfort in the maxillary area... she saw DrESL and was treated w/ a ZPak and NASONEX and is slowly improving... her second concern is for poss Adult ADD- stating that she can't seem to get anything done & can't finish tasks... never had similiar prob and not prev eval by psychologist etc...  Current Problems:  PHYSICAL EXAMINATION (ICD-V70.0) - she is up-to-date w/ GYN= DrHenley... ALLERGIC RHINITIS (ICD-477.9) - prev allergy eval by DrESL... she's been on allergy shots for 26 yrs + venom shots etc... Hx of DYSPNEA (ICD-786.05) - she had a neg cardiac eval by DrBBrodie in 2002. HYPERCHOLESTEROLEMIA (ICD-272.0) - on SIMVASTATIN 20 w/ last FLP 7/08 showing TChol192, TG 83, HDL 45, LDL 131... ACID REFLUX DISEASE (ICD-530.81) IRRITABLE BOWEL SYNDROME (ICD-564.1) - last colonoscopy 1/05 by DrDBrodie showed ?mild colitis, no polyps, no divertics. Hx of NEPHROLITHIASIS (ICD-592.0) - DrOttelin follows regularly several small non-obstructing renal calculi in kidneys & ? of medullary sponge kidney... OSTEOPOROSIS (ICD-733.00) - BMD done 10/08 at [facility] is improved w/ TScores -0.2 to -1.2 DYSTHYMIA (ICD-300.4) - stable on PAXIL ANEMIA, MILD (ICD-285.9)     Current Allergies (reviewed today): ! WELLBUTRIN (BUPROPION HCL) ERYTHROMYCIN ETHYLSUCCINATE * NULEV (HYOSCYAMINE SULFATE) DOXYCYCLINE HYCLATE (DOXYCYCLINE HYCLATE)  Past Medical History:    Current Problems:         ALLERGIC RHINITIS (ICD-477.9)    Hx of DYSPNEA (ICD-786.05)    HYPERCHOLESTEROLEMIA (ICD-272.0)    ACID REFLUX DISEASE (ICD-530.81)    IRRITABLE BOWEL SYNDROME (ICD-564.1)    Hx of NEPHROLITHIASIS  (ICD-592.0)    OSTEOPOROSIS (ICD-733.00)    DYSTHYMIA (ICD-300.4)    ANEMIA, MILD (ICD-285.9)       Family History:    Father (Dr. Kenn File) died age 14 with heart disease & had prev CABG    Mother died age 4 with heart disease & had CABG, DM, renal failure    4 Sibs- 3 Bro & 1 Sis... all a/w  Social History:    Non-smoker    Social Etoh    Review of Systems       The patient complains of nasal congestion, sore throat, cough, constipation, indigestion/heartburn, and depression.  The patient denies fever, chills, sweats, anorexia, fatigue, weakness, malaise, weight loss, sleep disorder, blurring, diplopia, eye irritation, eye discharge, vision loss, eye pain, photophobia, earache, ear discharge, tinnitus, decreased hearing, nosebleeds, hoarseness, chest pain, palpitations, syncope, dyspnea on exertion, orthopnea, PND, peripheral edema, dyspnea at rest, excessive sputum, hemoptysis, wheezing, pleurisy, nausea, vomiting, diarrhea, change in bowel habits, abdominal pain, melena, hematochezia, jaundice, gas/bloating, dysphagia, odynophagia, dysuria, hematuria, urinary frequency, urinary hesitancy, nocturia, incontinence, decreased libido, erectile dysfunction, back pain, joint pain, joint swelling, muscle cramps, muscle weakness, stiffness, arthritis, sciatica, restless legs, leg pain at night, leg pain with exertion, rash, itching, dryness, suspicious lesions, paralysis, paresthesias, seizures, tremors, vertigo, transient blindness, frequent falls, frequent headaches, difficulty walking, anxiety, memory loss, confusion, cold intolerance, heat intolerance, polydipsia, polyphagia, polyuria, unusual weight change, abnormal bruising, bleeding, enlarged lymph nodes, urticaria, allergic rash, hay fever, and recurrent infections.     Vital Signs:  Patient Profile:   60 Years Old Female Weight:      134 pounds O2 Sat:  97 % Temp:     97.4 degrees F oral Pulse rate:   73 / minute BP sitting:    110 / 72  (right arm)  Vitals Entered By: Marijo File CMA (April 11, 2007 9:31 AM) Oxygen therapy Room Air                 Physical Exam  WD, WN, 60 y/o WF in NAD... GENERAL:  Alert & oriented; pleasant & cooperative. HEENT:  [address]/AT, EOM-wnl, PERRLA, Fundi-benign, EACs-clear, TMs-wnl, NOSE-clear, THROAT-clear & wnl. NECK:  Supple w/ full ROM; no JVD; normal carotid impulses w/o bruits; no thyromegaly or nodules palpated; no lymphadenopathy. CHEST:  Clear to P & A; without wheezes/ rales/ or rhonchi. HEART:  Regular Rhythm; without murmurs/ rubs/ or gallops. ABDOMEN:  Soft & nontender; normal bowel sounds; no organomegaly or masses detected. EXT: without deformities or arthritic changes; no varicose veins/ venous insuffic/ or edema. NEURO:  CN's intact; motor testing normal; sensory testing normal; gait normal & balance OK. DERM:  No lesions noted; no rash etc...       Impression & Recommendations:  Problem # 1:  PHYSICAL EXAMINATION (ICD-V70.0) Several concerns were addresses and pt reassured... Orders: T-2 View CXR (71020TC) - NAD 12 Lead EKG (12 Lead EKG) - NSR, NAD Venipuncture (78295) TLB-Lipid Panel (80061-LIPID) - TChol 176, TG 95, HDL 57, LDL 100 TLB-BMP (Basic Metabolic Panel-BMET) (80048-METABOL) - OK TLB-CBC Platelet - w/Differential (85025-CBCD) - OK TLB-Hepatic/Liver Function Pnl (80076-HEPATIC) - OK TLB-TSH (Thyroid Stimulating Hormone) (84443-TSH) - OK T-Vitamin D (25-Hydroxy) (62130-86578) - OK  Problem # 2:  HYPERCHOLESTEROLEMIA (ICD-272.0) Continue the SIMVASTATIN 20... f/u labs today. Her updated medication list for this problem includes:    Simvastatin 20 Mg Tabs (Simvastatin) .Marland Kitchen... 1 tab by mouth at bedtime  Problem # 3:  ACID REFLUX DISEASE (ICD-530.81) Continue PPI therapy... Her updated medication list for this problem includes:    Aciphex 20 Mg Tbec (Rabeprazole sodium) .Marland Kitchen... Take 1 tablet by mouth once a day  Problem # 4:  DYSTHYMIA  (ICD-300.4) She continues on PAXIL.Marland KitchenMarland Kitchen refilled... before new meds considered- refer to DrGutterman et al for consideration of ADD.  Complete Medication List: 1)  Adult Aspirin Ec Low Strength 81 Mg Tbec (Aspirin) .... Take 1 tablet by mouth once a day 2)  Simvastatin 20 Mg Tabs (Simvastatin) .Marland Kitchen.. 1 tab by mouth at bedtime 3)  Aciphex 20 Mg Tbec (Rabeprazole sodium) .... Take 1 tablet by mouth once a day 4)  Boniva 150 Mg Tabs (Ibandronate sodium) .Marland Kitchen.. 1 tab monthly 5)  Paxil Cr 12.5 Mg Tb24 (Paroxetine hcl) .... Take 1 tablet by mouth once a day 6)  Ambien Cr 12.5 Mg Tbcr (Zolpidem tartrate) .... Take one tablet by mouth at bedtime as needed 7)  Prempro 0.45-1.5 Mg Tabs (Conj estrog-medroxyprogest ace) .... Take 1 tablet by mouth once a day  Other Orders: Psychology Referral (Psychology)   Patient Instructions: 1)  Please schedule a follow-up appointment in 1 year. 2)  Continue on your low cholesterol, low fat diet.Marland KitchenMarland Kitchen 3)  Today we did your yearly lab work, call the "phone tree" in a few days for the results.Marland KitchenMarland Kitchen 4)  We will arrange for an appt w/ Dr Caralyn Guile for evaluation for poss Adult ADD and to see about any recommended medication changes.    Prescriptions: PAXIL CR 12.5 MG  TB24 (PAROXETINE HCL) Take 1 tablet by mouth once a day  #90 x 4   Entered and Authorized by:   Lorin Picket  Elayne Snare MD   Signed by:   Michele Mcalpine MD on 04/11/2007   Method used:   Print then Give to Patient   RxID:   [id number] ACIPHEX 20 MG TBEC (RABEPRAZOLE SODIUM) Take 1 tablet by mouth once a day  #90 x 4   Entered and Authorized by:   Michele Mcalpine MD   Signed by:   Michele Mcalpine MD on 04/11/2007   Method used:   Print then Give to Patient   RxID:   [id number] SIMVASTATIN 20 MG  TABS (SIMVASTATIN) 1 tab by mouth at bedtime  #90 x 4   Entered and Authorized by:   Michele Mcalpine MD   Signed by:   Michele Mcalpine MD on 04/11/2007   Method used:   Print then Give to Patient   RxID:    [id number]  ]

## 2010-05-20 NOTE — Progress Notes (Signed)
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  Initial call taken by: Paulene Floor, RN,  February 15, 2008 9:38 AM  Follow-up for Phone Call        Thanks Follow-up by: Hilarie Fredrickson MD,  February 15, 2008 11:10 AM

## 2010-05-20 NOTE — Progress Notes (Signed)
Summary: EGD?  Phone Note Call from Patient Call back at [phone]   Caller: Patient Call For: Dr. Juanda Chance Reason for Call: Talk to Nurse Summary of Call: would like to discuss whether the recall EGD is completely necessary Initial call taken by: Vallarie Mare,  January 07, 2010 3:23 PM  Follow-up for Phone Call        Patient actually wanted to know if she needs to see Dr Juanda Chance before her EGD appointment. I have advised her that if she is not having any problems, that she can have a direct EGD. This has been scheduled. Follow-up by: Lamona Curl CMA Duncan Dull),  January 07, 2010 3:52 PM

## 2010-05-20 NOTE — Progress Notes (Signed)
Summary: returned call  Phone Note Call from Patient   Caller: Patient Call For: nadel Summary of Call: pt states that nurse called her 2 days ago and she is returning call. ph# [phone] Initial call taken by: Tivis Ringer, CNA,  December 27, 2009 10:16 AM  Follow-up for Phone Call        Spoke with pt and notified that her results are on the PT and gave her the number to call.  I advised that her LDL and total chol are up so SN recs that she increases simvastatin from 20 mg to 40 mg.  Pt verbalized understanding and rx was sent to pharm for this. Follow-up by: Vernie Murders,  December 27, 2009 10:29 AM    New/Updated Medications: SIMVASTATIN 40 MG TABS (SIMVASTATIN) 1 by mouth at bedtime Prescriptions: SIMVASTATIN 40 MG TABS (SIMVASTATIN) 1 by mouth at bedtime  #30 x 11   Entered by:   Vernie Murders   Authorized by:   Michele Mcalpine MD   Signed by:   Vernie Murders on 12/27/2009   Method used:   Electronically to        [facility] [address] [phone]* (retail)       [address]       [place], [place]  [address]       Ph: [phone] or [phone]       Fax: [phone]   RxID:   [id number]

## 2010-05-20 NOTE — Progress Notes (Signed)
Summary: req appt today/ sn  LMTCBx2  Phone Note Call from Patient Call back at [phone]   Caller: Patient Call For: nadel Complaint: Cough/Sore throat Summary of Call: pt req to see sn today. coughing for 3 days (non-productive). chest hurts. (says it's from coughing). pt # [id number]. Initial call taken by: Tivis Ringer,  Sep 05, 2007 9:32 AM  Follow-up for Phone Call        lmtcb [facility]  Sep 05, 2007 1:00 PM  AttemptedTCB pt. LMTCB. Cloyde Reams RN  Sep 07, 2007 10:18 AM  Follow-up by: Cloyde Reams RN,  Sep 07, 2007 10:18 AM  Additional Follow-up for Phone Call Additional follow up Details #1::        Spoke with pt, states she has seen Dr. Corinda Gubler since leaving the message and is feeling better.  Told pt to call if she needed anything. Additional Follow-up by: Boone Master CNA,  Sep 07, 2007 3:07 PM

## 2010-05-20 NOTE — Procedures (Signed)
Summary: EGD   EGD  Procedure date:  02/28/2008  Findings:      Location: [facility]    Procedures Next Due Date:    EGD: 02/2010  Patient Name: Sheri Andrews, Sheri Andrews. MRN: [id number] Procedure Procedures: Panendoscopy (EGD) CPT: 43235.    with biopsy(s)/brushing(s). CPT: D1846139.    with passage of Maloney dilator Personnel: Endoscopist: Loucinda Croy L. Juanda Chance, MD.  Exam Location: Exam performed in Outpatient Clinic. Outpatient  Patient Consent: Procedure, Alternatives, Risks and Benefits discussed, consent obtained, from patient. Consent was obtained by the RN.  Indications Symptoms: Chest Pain. Abdominal pain, location: epigastric.  Surveillance of: 2003.  History  Current Medications: Patient is not currently taking Coumadin.  Pre-Exam Physical: Performed Feb 28, 2008  Cardio-pulmonary exam, HEENT exam, Abdominal exam, Extremity exam, Neurological exam, Mental status exam WNL.  Comments: Pt. history reviewed/updated, physical exam performed prior to initiation of sedation?yes Exam Exam Info: Maximum depth of insertion Duodenum, intended Duodenum. Vocal cords visualized. Gastric retroflexion performed. Images taken. ASA Classification: I. Tolerance: good.  Sedation Meds: Patient assessed and found to be appropriate for moderate (conscious) sedation. Fentanyl 50 mcg. given IV. Versed 5 mg. given IV. Exactocane 3 given aerosolized.  Monitoring: BP and pulse monitoring done. Oximetry used. Supplemental O2 given  Findings - STRICTURE / STENOSIS: Stricture in Distal Esophagus.  Constriction: partial. 40 cm from mouth. Lumen diameter is 16 mm. Biopsy of Stricture/Steno  taken. ICD9: Esophageal Stricture: 530.3.  - Dilation: Distal Esophagus. Maloney dilator used, Diameter: 48 mm, Minimal Resistance, No Heme present on extraction. 1  total dilators used. Patient tolerance fair. Outcome: successful.  - Normal: Body to Antrum.  - Normal: Duodenal Bulb to Duodenal  2nd Portion.   Assessment Abnormal examination, see findings above.  Diagnoses: 530.3: Esophageal Stricture.   Comments: mild es. stricture, s/p passage of 104 F Maloney dil Events  Unplanned Intervention: No unplanned interventions were required.  Unplanned Events: There were no complications. Plans Medication(s): Await pathology. PPI: Pantoprazole/Protonix 40 mg BID, starting Feb 28, 2008  Carafate slurry: 10cc BID, starting Feb 28, 2008   Disposition: After procedure patient sent to recovery. After recovery patient sent home.   cc: Alroy Dust, MD  REPORT OF SURGICAL PATHOLOGY   Case #: [id number] Patient Name: Sheri Andrews, Sheri Andrews. Office Chart Number:  N/A   MRN: [id number] Pathologist: Ferd Hibbs. 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  -  THERE IS NO EVIDENCE OF DYSPLASIA OR MALIGNANCY. -  SEE COMMENT.        COMMENT An Alcian Blue stain is performed to determine the presence of intestinal metaplasia (goblet cell metaplasia). The Alcian Blue stain shows intestinal metaplasia (goblet cell metaplasia).  The control stained appropriately. A  PAS fungal stain is negative for the presence of fungal organisms.  (JK:jy) 03/01/08   jy Date Reported:  03/01/2008     Ferd Hibbs. Colonel Bald, MD  March 01, 2008 MRN: [id number]  [facility] [address] [place], [place]  [address]  Dear Ms. Landress,  I am pleased to inform you that the biopsies taken during your recent endoscopic examination did not show any evidence of cancer upon pathologic examination.  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 While not cancer, it is  pre-cancerous (can progress to cancer) and needs to be monitored  with repeat endoscopic examination and biopsies.  Fortunately, it is quite rare that this develops into cancer, but careful monitoring of the condition along with taking your medication as prescribed is important in reducing the risk of developing cancer.  It is my recommendation that you have a repeat upper gastrointestinal endoscopic examination in 2_ years.  Additional information/recommendations:  _x_Please call [phone] to schedule a return visit to further      evaluate your condition.  _x_Continue with treatment plan as outlined the day of your exam.  Please call us if you have or develop heartburn, reflux symptoms, any swallowing problems, or if you have questions about your condition that have not been fully answered at this time.  Sincerely,  Hart Carwin MD  This report was created from the original endoscopy report, which was reviewed and signed by the above listed endoscopist.

## 2010-05-20 NOTE — Procedures (Signed)
Summary: Upper Endoscopy  Patient: Sheri Andrews Note: All result statuses are Final unless otherwise noted.  Tests: (1) Upper Endoscopy (EGD)   EGD Upper Endoscopy       DONE     [facility]     [address]     [place], [place]  [address]           ENDOSCOPY PROCEDURE REPORT           PATIENT:  Sheri Andrews, Sheri Andrews  MR#:  [id number]     BIRTHDATE:  1950-11-24, 58 yrs. old  GENDER:  female           ENDOSCOPIST:  Hedwig Morton. Juanda Chance, MD     Referred by:  Alroy Dust, M.D.           PROCEDURE DATE:  02/19/2010     PROCEDURE:  EGD with biopsy, 43239     ASA CLASS:  Class I     INDICATIONS:  h/o Barrett's Esophagus Barrett's on EGD 03/2008,     chest pains, on Protonix           MEDICATIONS:   Versed 5 mg, Fentanyl 50 mcg     TOPICAL ANESTHETIC:  Exactacain Spray           DESCRIPTION OF PROCEDURE:   After the risks benefits and     alternatives of the procedure were thoroughly explained, informed     consent was obtained.  The [facility] [id number] endoscope was     introduced through the mouth and advanced to the second portion of     the duodenum, without limitations.  The instrument was slowly     withdrawn as the mucosa was fully examined.     <<PROCEDUREIMAGES>>           irregular Z-line. With standard forceps, a biopsy was obtained and     sent to pathology (see image1 and image4).  Otherwise the     examination was normal (see image3 and image2).    Retroflexed     views revealed no abnormalities.    The scope was then withdrawn     from the patient and the procedure completed.           COMPLICATIONS:  None           ENDOSCOPIC IMPRESSION:     1) Irregular Z-line     2) Otherwise normal examination     RECOMMENDATIONS:     1) Await biopsy results     continue Protonix 40 mg daily           REPEAT EXAM:  In 2 year(s) for.           ______________________________     Hedwig Morton. Juanda Chance, MD           CC:           n.     eSIGNED:   Hedwig Morton. Brodie at 02/19/2010  11:42 AM           Ancil Linsey, [id number]  Note: An exclamation mark (!) indicates a result that was not dispersed into the flowsheet. Document Creation Date: 02/19/2010 11:42 AM _______________________________________________________________________  (1) Order result status: Final Collection or observation date-time: 02/19/2010 11:35 Requested date-time:  Receipt date-time:  Reported date-time:  Referring Physician:   Ordering Physician: Lina Sar [phone]) Specimen Source:  Source: Launa Grill Order Number: [id number] Lab site:   Appended Document: Upper Endoscopy     Procedures Next  Due Date:    EGD: 02/2012

## 2010-05-20 NOTE — Assessment & Plan Note (Signed)
Summary: physical ///kp   Primary Care Provider:  Alroy Dust, MD  CC:  32 month ROV & CPX... .  History of Present Illness: 60 y/o Andrews here for a yearly physical exam and review of medical issues...    ~  Dec08:  she has 2 concerns today: the first is a lingering sinus infection... she has severe allergies and recent incr in nasal congestion, drainage (clear, sl beige) and discomfort in the maxillary area... she saw DrESL and was treated w/ a ZPak and NASONEX and is slowly improving... her second concern is for poss Adult ADD- stating that she can't seem to get anything done & can't finish tasks... never had similiar prob and not prev eval by psychologist etc...   ~  December 19, 2009:  last seen by me 12/08- she has had mult specialists tending to her numerous problems- DrDBrodie for GI w/ Barrett's esoph & hx ischemic colitis;  DrHenley for GYN;  DrShoemaker for ENT w/ LER & "globus" sensation;  DrCrenshaw et al for Cards- hx atyp CP, hosp 4/10 w/ neg Myoview ("he released me")... she has been doing relatively well overall but is concerned about "swelling" in her supraclav fossa bilat> exam is neg, no adenopathy, & CXR clear... she is reassured.    Current Problems:   Health Maintenance - she is up-to-date w/ GYN= DrHenley...  ALLERGIC RHINITIS (ICD-477.9) - prev allergy eval by DrESL... she's been on allergy shots for 30 yrs + venom shots etc...  Hx of DYSPNEA (ICD-786.05) & OTHER CHEST PAIN (ICD-786.59) - she had a neg cardiac eval by DrBBrodie in 2002... and was adm w/ atyp CP & dizzy during a work out> EKG & Enz were neg, CTA was neg, & f/u Myoview was neg...  ~  CXR 9/11 was stable- clear, NAD...  HYPERCHOLESTEROLEMIA (ICD-272.0) - on SIMVASTATIN 20mg /d...   ~  FLP 7/08 showed TChol 192, TG 83, HDL 45, LDL 131  ~  FLP 9/11 showed TChol 201, TG 107, HDL 49, LDL 140... rec> incr Simva to 40mg /d...  ACID REFLUX DISEASE (ICD-530.81) & BARRETTS ESOPHAGUS (ICD-530.85) - followed by  DrDBrodie w/ last EGD 11/09 and Ba Swallow/ UGIS 6/10 per her protocol...  IRRITABLE BOWEL SYNDROME (ICD-564.1) - colonoscopy 1/05 by DrDBrodie showed ?mild colitis, no polyps, no divertics... she had f/u colonoscopy 2/10 w/ hems & some rectal inflamm (Rx AnusolHC suppos)...  Hx of NEPHROLITHIASIS (ICD-592.0) - DrOttelin follows regularly several small non-obstructing renal calculi in kidneys & ? of medullary sponge kidney...  OSTEOPOROSIS (ICD-733.00) - she takes Calcium, MVI, Vit D...  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   Preventive Screening-Counseling & Management  Alcohol-Tobacco     Smoking Status: never  Caffeine-Diet-Exercise     Does Patient Exercise: no  Allergies: 1)  ! 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 Father (Dr. Kenn File) died age 49 with heart disease & had prev CABG Mother died age 18 with heart disease & had CABG, DM, renal failure 4 Sibs- 3 Bro & 1 Sis... all a/w No FH of Colon Cancer: Family History of Coronary Artery Disease:   Social History: Reviewed history from 08/07/2009 and no changes required. Part Time Work Non-smoker Social  Etoh Alcohol Use - yes - 6 drinks per week Daily Caffeine Use: Ice tea Patient does not get regular exercise.   Review of Systems       The patient complains of fatigue, malaise, dyspnea on exertion, anxiety, and hay fever.  The patient denies fever, chills, sweats, anorexia, weakness, weight loss, sleep disorder, blurring, diplopia, eye irritation, eye discharge, vision loss, eye pain, photophobia, earache, ear discharge, tinnitus, decreased hearing, nasal congestion, nosebleeds, sore throat, hoarseness, chest pain, palpitations, syncope, orthopnea, PND, peripheral edema, cough, dyspnea at rest, excessive sputum, hemoptysis, wheezing, pleurisy, nausea, vomiting, diarrhea, constipation, change in bowel habits, abdominal pain, melena, hematochezia, jaundice, gas/bloating, indigestion/heartburn, dysphagia, odynophagia, dysuria, hematuria, urinary frequency, urinary hesitancy, nocturia, incontinence, back pain, joint pain, joint swelling, muscle cramps, muscle weakness, stiffness, arthritis, sciatica, restless legs, leg pain at night, leg pain with exertion, rash, itching, dryness, suspicious lesions, paralysis, paresthesias, seizures, tremors, vertigo, transient blindness, frequent falls, frequent headaches, difficulty walking, depression, memory loss, confusion, cold intolerance, heat intolerance, polydipsia, polyphagia, polyuria, unusual weight change, abnormal bruising, bleeding, enlarged lymph nodes, urticaria, allergic rash, and recurrent infections.    Vital Signs:  Patient profile:   60 year old female Height:      64 inches Weight:      133 pounds BMI:     22.91 O2 Sat:      99 % on Room air Temp:     98.1 degrees F oral Pulse rate:   95 / minute BP sitting:   122 / 76  (left arm) Cuff size:   regular  Vitals Entered By: Randell Loop CMA (December 19, 2009 9:21 AM)  O2 Sat at Rest %:  99 O2 Flow:  Room air CC: 32 month ROV & CPX...  Is Patient Diabetic? No Pain Assessment Patient  in pain? no      Comments meds updated today with pt   Physical Exam  Additional Exam:  WD, WN, 60 y/o Andrews in NAD... GENERAL:  Alert & oriented; pleasant & cooperative... HEENT:  /AT, EOM-wnl, PERRLA, Fundi-benign, EACs-clear, TMs-wnl, NOSE-clear, THROAT-clear & wnl. NECK:  Supple w/ full ROM; no JVD; normal carotid impulses w/o bruits; no thyromegaly or nodules palpated; no lymphadenopathy. CHEST:  Clear to P & A; without wheezes/ rales/ or rhonchi. HEART:  Regular Rhythm; without murmurs/ rubs/ or gallops. ABDOMEN:  Soft & nontender; normal bowel sounds; no organomegaly or masses detected. EXT: without deformities or arthritic changes; no varicose veins/ venous insuffic/ or edema. NEURO:  CN's intact; motor testing normal; sensory testing normal; gait normal & balance OK. DERM:  No lesions noted; no rash etc...    CXR  Procedure date:  12/19/2009  Findings:      CHEST - 2 VIEW Comparison: Chest x-ray of 08/10/2008   Findings: The lungs are clear and slightly hyperaerated.  The heart is within normal limits in size.  Mediastinal contours are stable. Mild apical pleuroparenchymal scarring is stable.  No bony abnormality is seen.   IMPRESSION: Stable chest x-ray.  No active lung disease.   Read By:  Juline Patch,  M.D.  MISC. Report  Procedure date:  12/19/2009  Findings:      BMP (METABOL)   Sodium                    140 mEq/L                   135-145   Potassium                 3.9 mEq/L                   3.5-5.1   Chloride                  104 mEq/L                   96-112   Carbon Dioxide            28 mEq/L                    19-32   Glucose                   88 mg/dL                    16-10   BUN                       16 mg/dL                    9-60   Creatinine                0.8 mg/dL                   4.5-4.0   Calcium                   9.4 mg/dL                   9.8-11.9   GFR                       82.58 mL/min                 >60  Hepatic/Liver Function Panel (HEPATIC)   Total Bilirubin           0.7 mg/dL                   1.4-7.8   Direct Bilirubin          0.1 mg/dL                   2.9-5.6   Alkaline Phosphatase      48 U/L                      39-117   AST                       25 U/L                      0-37   ALT                       21 U/L                      0-35   Total  Protein             7.1 g/dL                    1.6-1.0   Albumin                   4.2 g/dL                    9.6-0.4  CBC Platelet w/Diff (CBCD)   White Cell Count     [L]  3.7 K/uL                    4.5-10.5   Red Cell Count            4.00 Mil/uL                 3.87-5.11   Hemoglobin                12.8 g/dL                   54.0-98.1   Hematocrit                37.0 %                      36.0-46.0   MCV                       92.3 fl                     78.0-100.0   Platelet Count            219.0 K/uL                  150.0-400.0   Neutrophil %         [L]  40.7 %                      43.0-77.0   Lymphocyte %         [H]  48.2 %                      12.0-46.0   Monocyte %                9.3 %                       3.0-12.0   Eosinophils%              1.6 %                       0.0-5.0   Basophils %               0.2 %                       0.0-3.0  Comments:      Lipid Panel (LIPID)   Cholesterol          [H]  201 mg/dL                   1-914   Triglycerides             107.0 mg/dL                 7.8-295.0  HDL                       60.45 mg/dL                 >40.98 Cholesterol LDL - Direct                             140.2 mg/dL  TSH (TSH)   FastTSH                   1.72 uIU/mL                 0.35-5.50  UDip w/Micro (URINE)   Color                     LT. YELLOW    Clarity                   CLEAR                       Clear   Specific Gravity          1.020                       1.000 - 1.030   Urine Ph                  6.0                         5.0-8.0   Protein                   NEGATIVE                     Negative   Urine Glucose             NEGATIVE                    Negative   Ketones                   NEGATIVE                    Negative   Urine Bilirubin           NEGATIVE                    Negative   Blood                     TRACE-INTACT                Negative   Urobilinogen              0.2                         0.0 - 1.0   Leukocyte Esterace        NEGATIVE                    Negative   Nitrite                   NEGATIVE                    Negative   Urine RBC  0-2/hpf                     0-2/hpf   Urine Mucus               Presence of                 None   Urine Epith               Rare(0-4/hpf)               Rare(0-4/hpf)   Urine Bacteria            Rare(<10/hpf)               None   Impression & Recommendations:  Problem # 1:  PHYSICAL EXAMINATION (ICD-V70.0)  Orders: T-2 View CXR (71020TC) TLB-BMP (Basic Metabolic Panel-BMET) (80048-METABOL) TLB-Hepatic/Liver Function Pnl (80076-HEPATIC) TLB-CBC Platelet - w/Differential (85025-CBCD) TLB-Lipid Panel (80061-LIPID) TLB-TSH (Thyroid Stimulating Hormone) (84443-TSH) TLB-Udip w/ Micro (81001-URINE)  Problem # 2:  ALLERGIC RHINITIS (ICD-477.9) Followed by DrESL & still on shots... Her updated medication list for this problem includes:    Zyrtec Allergy 10 Mg Tabs (Cetirizine hcl) ..... One tablet by mouth once daily  Problem # 3:  OTHER CHEST PAIN (ICD-786.59) Cardiac w/u in hosp & post hosp Myoview w/ eval DrCrenshaw was neg...  Problem # 4:  HYPERCHOLESTEROLEMIA (ICD-272.0) Labs not at goal on 20mg /d & I rec incr to 40mg /d... we will call her to discuss & phone in the higher dose. Her updated medication list for this problem includes:    Simvastatin 20 Mg Tabs (Simvastatin) .Marland Kitchen... 1 tab by mouth at bedtime  Problem # 5:  BARRETTS ESOPHAGUS (ICD-530.85) Followed by DrDBrodie for GI...  Problem # 6:  IRRITABLE BOWEL SYNDROME (ICD-564.1) She gets colonoscopies per DrDBrodie as  well...  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 on Klonopin & Celexa at present...  Complete Medication List: 1)  Zyrtec Allergy 10 Mg Tabs (Cetirizine hcl) .... One tablet by mouth once daily 2)  Adult Aspirin Ec Low Strength 81 Mg Tbec (Aspirin) .... Take 1 tablet by mouth once a day 3)  Simvastatin 20 Mg Tabs (Simvastatin) .Marland Kitchen.. 1 tab by mouth at bedtime 4)  Fish Oil 300 Mg Caps (Omega-3 fatty acids) .... Take as directed... 5)  Protonix 40 Mg Tbec (Pantoprazole sodium) .... Take 1 tablet by mouth two times a day 6)  Prempro 0.625-2.5 Mg Tabs (Conj estrog-medroxyprogest ace) .... Take 1 tab daily (.025 mg) 7)  Calcium 600 1500 Mg Tabs (Calcium carbonate) .Marland Kitchen.. 1 tablet by mouth once daily 8)  Cvs Magnesium 250 Mg Tabs (Magnesium) .Marland Kitchen.. 1 tablet by mouth once daily 9)  Vitamin D 1000 Unit Tabs (Cholecalciferol) .... Take 1 tablet by mouth once a day 10)  Clonazepam Odt 0.25 Mg Tbdp (Clonazepam) .Marland Kitchen.. 1 tablet by mouth as needed 11)  Celexa 40 Mg Tabs (Citalopram hydrobromide) .... One tablet by mouth once daily  Patient Instructions: 1)  Today we updated your med list- see below.... 2)  We refilled your meds per request... 3)  Today we did your follow up CXR & fasting blood work... please call the "phone tree" in a few days for your lab results.Marland KitchenMarland Kitchen 4)  Call for any problems or if we can be of service in any way... Prescriptions: CELEXA 40 MG TABS (CITALOPRAM HYDROBROMIDE) one tablet by mouth once daily  #30 x 6   Entered and Authorized by:  Michele Mcalpine MD   Signed by:   Michele Mcalpine MD on 12/19/2009   Method used:   Print then Give to Patient   RxID:   [id number] CLONAZEPAM ODT 0.25 MG TBDP (CLONAZEPAM) 1 tablet by mouth as needed  #60 x 6   Entered and Authorized by:   Michele Mcalpine MD   Signed by:   Michele Mcalpine MD on 12/19/2009   Method used:   Print then Give to Patient   RxID:   [id number] PROTONIX 40 MG TBEC (PANTOPRAZOLE  SODIUM) Take 1 tablet by mouth two times a day  #180 x 4   Entered and Authorized by:   Michele Mcalpine MD   Signed by:   Michele Mcalpine MD on 12/19/2009   Method used:   Print then Give to Patient   RxID:   [id number] SIMVASTATIN 20 MG  TABS (SIMVASTATIN) 1 tab by mouth at bedtime  #90 x 4   Entered and Authorized by:   Michele Mcalpine MD   Signed by:   Michele Mcalpine MD on 12/19/2009   Method used:   Print then Give to Patient   RxID:   [id number]    Immunization History:  Influenza Immunization History:    Influenza:  historical (01/31/2009)

## 2010-05-20 NOTE — Letter (Signed)
Summary: EGD Instructions  [facility]  [address] [place], [place] [address]   Phone: [phone]  Fax: [phone]       Sheri Andrews    03-25-1951    MRN: [id number]       Procedure Day Dorna Bloom: Wednesday 02-19-10     Arrival Time: 10:00 a.m.     Procedure Time: 11:00 a.m.     Location of Procedure:                    _x _ [facility] (4th Floor)  PREPARATION FOR ENDOSCOPY   On Wednesday 02-19-10, THE DAY OF THE PROCEDURE:  1.   No solid foods, milk or milk products are allowed after midnight the night before your procedure.  2.   Do not drink anything colored red or purple.  Avoid juices with pulp.  No orange juice.  3.  You may drink clear liquids until  9:00 a.m. , which is 2 hours before your procedure.                                                                                                CLEAR LIQUIDS INCLUDE: Water Jello Ice Popsicles Tea (sugar ok, no milk/cream) Powdered fruit flavored drinks Coffee (sugar ok, no milk/cream) Gatorade Juice: apple, white grape, white cranberry  Lemonade Clear bullion, consomm, broth Carbonated beverages (any kind) Strained chicken noodle soup Hard Candy   MEDICATION INSTRUCTIONS  Unless otherwise instructed, you should take regular prescription medications with a small sip of water as early as possible the morning of your procedure.              OTHER INSTRUCTIONS  You will need a responsible adult at least 61 years of age to accompany you and drive you home.   This person must remain in the waiting room during your procedure.  Wear loose fitting clothing that is easily removed.  Leave jewelry and other valuables at home.  However, you may wish to bring a book to read or an iPod/MP3 player to listen to music as you wait for your procedure to start.  Remove all body piercing jewelry and leave at home.  Total time from sign-in until discharge is approximately 2-3  hours.  You should go home directly after your procedure and rest.  You can resume normal activities the day after your procedure.  The day of your procedure you should not:   Drive   Make legal decisions   Operate machinery   Drink alcohol   Return to work  You will receive specific instructions about eating, activities and medications before you leave.    The above instructions have been reviewed and explained to me by   Doristine Church RN II  February 12, 2010 4:57 PM___    I fully understand and can verbalize these instructions _____________________________ Date _________

## 2010-05-20 NOTE — Progress Notes (Signed)
Summary: REV Rescheduled  Phone Note Call from Patient Call back at [phone]   Caller: Patient Call For: Rutha Melgoza Reason for Call: Talk to Nurse Summary of Call: would like to know if Dr Juanda Chance needs to see pt in the office for a f-up? Initial call taken by: Guadlupe Spanish [facility],  Sep 14, 2008 2:39 PM  Follow-up for Phone Call        Pt. had cx. her OV on 07-19-08, she has now rescheduled it for 10-08-08 at 9am. Pt. instructed to call back as needed.  Follow-up by: Laureen Ochs LPN,  Sep 14, 2008 2:53 PM

## 2010-05-22 NOTE — Progress Notes (Signed)
Summary: medications--returning call  Phone Note Call from Patient Call back at Home Phone [phone]   Caller: Patient Call For: Dr. Kriste Basque Summary of Call: Patient phoned she is faxing a form from [facility] for her and her spouse Loraine Leriche and it needs to be completed and signed by Dr. Kriste Basque. They n eed all prescriptions to be for a 3 month supply. She has already did this for all of her meds except the Simvastatin. The form needs to be faxed back to the number on the form. She can be reached at [phone]. Initial call taken by: Vedia Coffer,  April 09, 2010 4:15 PM  Follow-up for Phone Call        [email or web address] to find out what meds she needs to have filled. no fax has been seen. Randell Loop [facility]  April 11, 2010 2:30 PM    Returning call.  Please call back @ [phone] [facility]  April 16, 2010 9:59 AM  Additional Follow-up for Phone Call Additional follow up Details #1::        called and spoke with pt and she will refax these forms in the am for the meds that she needs to get before the first of the year. Randell Loop [facility]  April 16, 2010 3:39 PM     Additional Follow-up for Phone Call Additional follow up Details #2::    attempted to call pt but no answer will try back later Randell Loop [facility]  April 10, 2010 3:15 PM

## 2010-06-12 ENCOUNTER — Telehealth: Payer: Self-pay | Admitting: Pulmonary Disease

## 2010-06-17 NOTE — Progress Notes (Signed)
Summary: Sheri Andrews at [facility] pt transfered prescription and she needs directio  Phone Note From Pharmacy   Caller: Sheri Andrews AT [facility] Call For: Allex Lapoint  Summary of Call: Patient transfered a prescription for her klonopin 0.25 mg odt and the directions say as needed but they need more information to calculate the amount. She can be reached at  [phone] Initial call taken by: Vedia Coffer,  June 12, 2010 10:59 AM  Follow-up for Phone Call        please advise on directions as well as quantity and  refills of Klonopin.  Per her med list in EMR,  directions only state "take 1 tab by mouth as needed"  Pt last saw SN for CPX on 12/19/2009 Arman Filter LPN  June 12, 2010 12:20 PM   Additional Follow-up for Phone Call Additional follow up Details #1::        called and spoke with Sheri Andrews at [facility]---they did transfer pts rx for the klonopin 0.25  ---they needed directions of the meds----take one tablet as needed  max of 2 per day #60 with 1 refills given.  Sheri Andrews will call back for further refills Randell [address] [facility]  June 12, 2010 12:31 PM

## 2010-07-30 LAB — CBC
HCT: 35.2 % — ABNORMAL LOW (ref 36.0–46.0)
HCT: 36.8 % (ref 36.0–46.0)
Hemoglobin: 12.2 g/dL (ref 12.0–15.0)
Hemoglobin: 12.7 g/dL (ref 12.0–15.0)
MCHC: 34.4 g/dL (ref 30.0–36.0)
MCHC: 34.6 g/dL (ref 30.0–36.0)
MCV: 92.8 fL (ref 78.0–100.0)
MCV: 93.7 fL (ref 78.0–100.0)
Platelets: 193 10*3/uL (ref 150–400)
Platelets: 221 10*3/uL (ref 150–400)
RBC: 3.75 MIL/uL — ABNORMAL LOW (ref 3.87–5.11)
RBC: 3.97 MIL/uL (ref 3.87–5.11)
RDW: 12.9 % (ref 11.5–15.5)
RDW: 13.2 % (ref 11.5–15.5)
WBC: 4.2 10*3/uL (ref 4.0–10.5)
WBC: 4.6 10*3/uL (ref 4.0–10.5)

## 2010-07-30 LAB — COMPREHENSIVE METABOLIC PANEL
ALT: 13 U/L (ref 0–35)
ALT: 17 U/L (ref 0–35)
AST: 18 U/L (ref 0–37)
AST: 25 U/L (ref 0–37)
Albumin: 3.3 g/dL — ABNORMAL LOW (ref 3.5–5.2)
Albumin: 3.9 g/dL (ref 3.5–5.2)
Alkaline Phosphatase: 43 U/L (ref 39–117)
Alkaline Phosphatase: 45 U/L (ref 39–117)
BUN: 11 mg/dL (ref 6–23)
BUN: 11 mg/dL (ref 6–23)
CO2: 24 mEq/L (ref 19–32)
CO2: 28 mEq/L (ref 19–32)
Calcium: 8.7 mg/dL (ref 8.4–10.5)
Calcium: 9.4 mg/dL (ref 8.4–10.5)
Chloride: 105 mEq/L (ref 96–112)
Chloride: 106 mEq/L (ref 96–112)
Creatinine, Ser: 0.8 mg/dL (ref 0.4–1.2)
Creatinine, Ser: 0.91 mg/dL (ref 0.4–1.2)
GFR calc Af Amer: >60 mL/min (ref >60)
GFR calc Af Amer: >60 mL/min (ref >60)
GFR calc non Af Amer: >60 mL/min (ref >60)
GFR calc non Af Amer: >60 mL/min (ref >60)
Glucose, Bld: 95 mg/dL (ref 70–99)
Glucose, Bld: 96 mg/dL (ref 70–99)
Potassium: 3.5 mEq/L (ref 3.5–5.1)
Potassium: 4 mEq/L (ref 3.5–5.1)
Sodium: 137 mEq/L (ref 135–145)
Sodium: 139 mEq/L (ref 135–145)
Total Bilirubin: 0.3 mg/dL (ref 0.3–1.2)
Total Bilirubin: 0.7 mg/dL (ref 0.3–1.2)
Total Protein: 6.1 g/dL (ref 6.0–8.3)
Total Protein: 6.7 g/dL (ref 6.0–8.3)

## 2010-07-30 LAB — URINALYSIS, ROUTINE W REFLEX MICROSCOPIC
Bilirubin Urine: NEGATIVE
Bilirubin Urine: NEGATIVE
Glucose, UA: NEGATIVE mg/dL
Glucose, UA: NEGATIVE mg/dL
Hgb urine dipstick: NEGATIVE
Hgb urine dipstick: NEGATIVE
Ketones, ur: NEGATIVE mg/dL
Ketones, ur: NEGATIVE mg/dL
Nitrite: NEGATIVE
Nitrite: NEGATIVE
Protein, ur: 30 mg/dL — AB
Protein, ur: NEGATIVE mg/dL
Specific Gravity, Urine: 1.007 (ref 1.005–1.030)
Specific Gravity, Urine: 1.016 (ref 1.005–1.030)
Urobilinogen, UA: 0.2 mg/dL (ref 0.0–1.0)
Urobilinogen, UA: 0.2 mg/dL (ref 0.0–1.0)
pH: 6.5 (ref 5.0–8.0)
pH: 8.5 — ABNORMAL HIGH (ref 5.0–8.0)

## 2010-07-30 LAB — LIPID PANEL
Cholesterol: 180 mg/dL (ref 0–200)
HDL: 50 mg/dL (ref >39)
LDL Cholesterol: 110 mg/dL — ABNORMAL HIGH (ref 0–99)
Total CHOL/HDL Ratio: 3.6 RATIO
Triglycerides: 99 mg/dL (ref <150)
VLDL: 20 mg/dL (ref 0–40)

## 2010-07-30 LAB — CARDIAC PANEL(CRET KIN+CKTOT+MB+TROPI)
CK, MB: 0.7 ng/mL (ref 0.3–4.0)
CK, MB: 0.8 ng/mL (ref 0.3–4.0)
CK, MB: 1 ng/mL (ref 0.3–4.0)
Relative Index: INVALID (ref 0.0–2.5)
Relative Index: INVALID (ref 0.0–2.5)
Relative Index: INVALID (ref 0.0–2.5)
Total CK: 79 U/L (ref 7–177)
Total CK: 80 U/L (ref 7–177)
Total CK: 90 U/L (ref 7–177)
Troponin I: 0.01 ng/mL (ref 0.00–0.06)
Troponin I: <0.01 ng/mL (ref 0.00–0.06)
Troponin I: <0.01 ng/mL (ref 0.00–0.06)

## 2010-07-30 LAB — POCT CARDIAC MARKERS
CKMB, poc: <1 ng/mL — ABNORMAL LOW (ref 1.0–8.0)
CKMB, poc: <1 ng/mL — ABNORMAL LOW (ref 1.0–8.0)
Myoglobin, poc: 39.5 ng/mL (ref 12–200)
Myoglobin, poc: 64.2 ng/mL (ref 12–200)
Troponin i, poc: <0.05 ng/mL (ref 0.00–0.09)
Troponin i, poc: <0.05 ng/mL (ref 0.00–0.09)

## 2010-07-30 LAB — POCT PREGNANCY, URINE: Preg Test, Ur: NEGATIVE

## 2010-07-30 LAB — DIFFERENTIAL
Basophils Absolute: 0 10*3/uL (ref 0.0–0.1)
Basophils Absolute: 0 10*3/uL (ref 0.0–0.1)
Basophils Relative: 0 % (ref 0–1)
Basophils Relative: 1 % (ref 0–1)
Eosinophils Absolute: 0 10*3/uL (ref 0.0–0.7)
Eosinophils Absolute: 0.1 10*3/uL (ref 0.0–0.7)
Eosinophils Relative: 1 % (ref 0–5)
Eosinophils Relative: 2 % (ref 0–5)
Lymphocytes Relative: 31 % (ref 12–46)
Lymphocytes Relative: 49 % — ABNORMAL HIGH (ref 12–46)
Lymphs Abs: 1.4 10*3/uL (ref 0.7–4.0)
Lymphs Abs: 2.1 10*3/uL (ref 0.7–4.0)
Monocytes Absolute: 0.3 10*3/uL (ref 0.1–1.0)
Monocytes Absolute: 0.3 10*3/uL (ref 0.1–1.0)
Monocytes Relative: 7 % (ref 3–12)
Monocytes Relative: 7 % (ref 3–12)
Neutro Abs: 1.7 10*3/uL (ref 1.7–7.7)
Neutro Abs: 2.7 10*3/uL (ref 1.7–7.7)
Neutrophils Relative %: 42 % — ABNORMAL LOW (ref 43–77)
Neutrophils Relative %: 60 % (ref 43–77)

## 2010-07-30 LAB — URINE MICROSCOPIC-ADD ON

## 2010-07-30 LAB — CORTISOL: Cortisol, Plasma: 3.9 ug/dL

## 2010-07-30 LAB — TSH: TSH: 2.178 u[IU]/mL (ref 0.350–4.500)

## 2010-09-02 NOTE — Discharge Summary (Signed)
Sheri Andrews, Sheri Andrews                ACCOUNT NO.:  [id number]   MEDICAL RECORD NO.:  [id number]          PATIENT TYPE:  OBV   LOCATION:  4728                         FACILITY:  [facility]   PHYSICIAN:  Pricilla Riffle, MD, FACCDATE OF BIRTH:  05/08/1950   DATE OF ADMISSION:  08/10/2008  DATE OF DISCHARGE:  08/12/2008                               DISCHARGE SUMMARY   ADDENDUM   PRIMARY CARDIOLOGIST:  Madolyn Frieze. Jens Som, MD, [facility]   PRIMARY CARE PHYSICIAN:  Lonzo Cloud. Kriste Basque, MD   DISCHARGE MEDICATIONS:  1. Protonix 40 mg daily.  2. Aspirin 81 mg daily.  3. Zocor 20 mg at bedtime.  4. Celexa 40 mg daily.  5. Calcium carbonate 500 mg twice a day.  6. Magnesium oxide 200 mg daily.  7. Prempro 25 mcg daily.  8. Klonopin 3 times a day p.r.n.  9. CoQ10 daily.   DISCHARGE LABORATORY DATA:  Cortisol 3.9.  Urinalysis, negative for UTI.  TSH 2.178.  Cardiac enzymes negative x3.  Sodium 139, potassium 4.0,  chloride 105, CO2 of 28, glucose 95, BUN 11, creatinine 0.91,  cholesterol 180, triglycerides 99, HDL 50, LDL 110.   DRUG ALLERGIES:  No known drug allergies.   FOLLOWUP PLANS AND APPOINTMENTS:  1. The patient will be seen by Dr. Olga Millers on Sep 07, 2008, at      12:15 a.m. for followup appointment.  2. The patient is scheduled for stress Myoview on August 14, 2008, at      7:30 a.m.  3. The patient has been advised to continue all medications prior to      admission.  4. The patient will follow up with Dr. Alroy Dust for continued      medical management.   TIME SPENT WITH THE PATIENT TO INCLUDE PHYSICIAN TIME:  30 minutes.      Bettey Mare. Lyman Bishop, NP      Pricilla Riffle, MD, [facility]  Electronically Signed    KML/MEDQ  D:  08/12/2008  T:  08/12/2008  Job:  [id number]   cc:   Lonzo Cloud. Kriste Basque, MD

## 2010-09-02 NOTE — H&P (Signed)
NAMESHEVAUN, Andrews                ACCOUNT NO.:  [id number]   MEDICAL RECORD NO.:  [id number]          PATIENT TYPE:  OBV   LOCATION:  4728                         FACILITY:  [facility]   PHYSICIAN:  Madolyn Frieze. Jens Som, MD, FACCDATE OF BIRTH:  03-10-1951   DATE OF ADMISSION:  08/10/2008  DATE OF DISCHARGE:                              HISTORY & PHYSICAL   CARDIOLOGIST:  New, it will be Sheri Andrews.  The patient has  requested Dr. Juanda Chance.  Dr. Juanda Chance is unavailable to accept new patient's  at this time.   PRIMARY CARE:  Lonzo Cloud. Kriste Basque, MD   GASTROINTESTINAL PHYSICIAN:  Hedwig Morton. Juanda Chance, MD   HISTORY OF PRESENT ILLNESS:  Ms. Sheri Andrews is a pleasant 60 year old  Caucasian female with no known history of coronary artery disease, had  an episode of similar chest discomfort back in 2002, had an exercise  treadmill done that was read by Dr. Charlies Constable that was essentially  negative, and the patient went on to be worked up and diagnosed with  GERD and a hiatal hernia.  She has had multiple GI issues since that  time and is followed by Lina Sar.  Ms. Sheri Andrews is a very active 77-  year-old female.  She works out with the trainer twice a week and then  does cardio at least 2-3 days a week.  This morning she was at the gym,  working out; note, she did not take her PPI prior to working out this  morning nor had she eaten anything.  She had worked out for about 45  minutes with the trainer.  She was sitting on the floor.  She stood up  on the mat to stretch.  Upon standing, she became very dizzy.  She  stated the room was not spinning, but she felt like she was moving.  This lasted for approximately an hour.  She became very diaphoretic,  nauseated, and ashen in color.  During this time, EMS was notified.  She  then began to have chest discomfort.  She felt like a very localized  substernal pressure.  She states it is different from the discomfort she  has with her GERD.  Her fingers began to  tingle.  She was transported to  [facility] for further evaluation.  Currently, she does not have any  chest discomfort.  The dizziness has resolved after nitroglycerin,  Zofran, aspirin, and oxygen.  Her family reports that it was a good hour  after she got here, however, before her color returned to normal.   PAST MEDICAL HISTORY:  1. GERD.  2. Hiatal hernia.  3. Barrett esophagus.  4. Ischemic colitis x2 episodes.  5. Hemangioma in the liver.  6. Noncardiac chest pain evaluation in 2002.  The patient underwent an      EGD and had esophageal dilatation done.  7. Kidney stone.  8. Mild anemia.  9. Irritable bowel syndrome.  10.Osteoporosis.  11.Multiple seasonal allergies.  12.Dyslipidemia.  13.Anxiety/depression.   SOCIAL HISTORY:  She lives in [place] with her spouse, who is out of  town at the current  time.  She works part time in [employer].  She has 2  adult children.  Denies ever using any tobacco or illicit substances or  alcohol use.  Very active in regards to exercise as stated above.   FAMILY HISTORY:  Positive for coronary artery disease, bypass, diabetes  in her mom, and coronary artery disease and bypass in her father.  She  has siblings that are alive and well.   REVIEW OF SYSTEMS:  Positive for diaphoresis, dizziness, chest  discomfort as stated above and chronic GERD and abdominal discomfort.  All other systems reviewed and negative.   ALLERGIES:  Include WELLBUTRIN, which causes her tongue to swell;  ERYTHROMYCIN causes severe nausea and vomiting; HYOSCYAMINE causes  nausea, vomiting; and DOXYCYCLINE cause nausea and vomiting.   CURRENT MEDICATIONS:  1. Protonix 40 b.i.d.  2. Aspirin 81.  3. Zocor 20.  4. Prempro.  5. Calcium.  6. Magnesium.  7. Celexa 40 mg daily.  8. Klonopin 0.5 mg p.r.n.  9. CoQ10 supplements.   PHYSICAL EXAMINATION:  VITAL SIGNS:  Temperature 98, heart rate 72,  respirations 15, blood pressure 120/63, sating 100% on 2 L.   GENERAL:  In no acute distress.  The patient does not appear her stated  age.  HEENT:  Unremarkable.  NECK:  Supple without lymphadenopathy.  No bruits and JVD.  CARDIOVASCULAR:  S1 and S2.  Regular rate and rhythm.  LUNGS:  Clear to auscultation bilaterally.  SKIN:  Warm and dry.  Normal in color.  ABDOMEN:  Soft, nontender.  Positive bowel sounds.  LOWER EXTREMITIES:  Without clubbing, cyanosis, or edema.  NEUROLOGIC:  Alert and oriented x3, pleasant affect.  Moving extremities  x4.   Chest x-ray shows no acute findings.  EKG shows normal sinus rhythm  without acute ST or T wave changes.  Point of care, negative x1 set.  AST 25, ALT 17, hematocrit 36.8, potassium 3.5, creatinine 0.8, glucose  96.   IMPRESSION:  Chest discomfort, unclear etiology and patient with  extensive gastrointestinal history.  Dr. Olga Andrews has been into  examine and assess the patient.  Plan is to admit the patient, cycle  enzymes.  We will get a stat CT of the chest, abdomen with contrast to  rule out per dissection protocol.  Repeat 12-lead EKG in the a.m. if  above is negative.  If symptoms improve, plan on discharging home and  schedule outpatient stress Myoview.      Dorian Pod, ACNP      Madolyn Frieze. Jens Som, MD, [facility]  Electronically Signed    MB/MEDQ  D:  08/10/2008  T:  08/11/2008  Job:  [id number]

## 2010-09-02 NOTE — Discharge Summary (Signed)
Sheri Andrews, Sheri Andrews                ACCOUNT NO.:  [id number]   MEDICAL RECORD NO.:  [id number]          PATIENT TYPE:  OBV   LOCATION:  4728                         FACILITY:  [facility]   PHYSICIAN:  Pricilla Riffle, MD, FACCDATE OF BIRTH:  30-Oct-1950   DATE OF ADMISSION:  08/10/2008  DATE OF DISCHARGE:  08/12/2008                               DISCHARGE SUMMARY   PRIMARY CARDIOLOGIST:  Madolyn Frieze. Jens Som, MD, [facility]   PRIMARY CARE PHYSICIAN:  Lonzo Cloud. Kriste Basque, MD   GASTROINTESTINAL PHYSICIAN:  Hedwig Morton. Juanda Chance, MD   PROCEDURES PERFORMED DURING HOSPITALIZATION:  None.   FINAL DISCHARGE DIAGNOSES:  1. Weakness.  2. Hypotension.  3. Gastroesophageal reflux disease.  4. Hiatal hernia.  5. Barrett esophagus.  6. Ischemic colitis.  7. Hemangioma in the liver.  8. Noncardiac chest pain in 2002.  9. Kidney stones.  10.Mild anemia.  11.Irritable bowel syndrome.  12.Osteoporosis.  13.Dyslipidemia.  14.Anxiety and depression.  15.Osteoporosis.   HOSPITAL COURSE:  This is a 60 year old Caucasian female with no known  coronary artery disease.  She did have a stress Myoview back in 2002,  which was found to be negative, who was at a workout with a trainer  while sitting on the floor, she stood up to stretch and became very  dizzy.  She stated the room was not spinning, but she felt like she was  moving and it lasted approximately 1 hour.  She became diaphoretic,  nauseated, and ashen.  EMS was called, right afterwards, the patient  began to have chest discomfort substernally.  The patient was  transferred to [facility] and was given nitroglycerin,  Zofran, and aspirin and oxygen.  The patient recovered approximately 1  hour later very slowly.  As a result of this, the patient was seen and  examined by Dr. Olga Millers and Dorian Pod, nurse practitioner.  The patient was admitted to rule out cardiac etiology of episode.  The  patient does have an extensive gastrointestinal  history and  hemoglobin/hematocrit was also checked to evaluate for anemia.   The patient did have followup per Dr. Dietrich Pates and was continued to  feel dizzy.  She was without complaint of chest pain or neck pain.  Her  cardiac enzymes were cycled and found be negative x3.  The patient did  undergo a CT scan of the chest, abdomen, and pelvis, which was negative  for aortic aneurysm, dissection, pulmonary embolus.  The patient also  was found to be negative for any other acute abnormalities.  It was  noted that she had a small hemangioma in the dome of the liver which was  not new.  The patient was to be discharged on August 11, 2008, but she  continued to complain of mild dizziness and therefore the patient was  given fluid bolus and monitored again overnight.   The following day, the patient had orthostatics completed lying 119/77,  sitting 123/73, standing 113/76 with no change in heart rate.  She had  no further complaints of dizziness the following morning and the patient  was reevaluated  by Dr. Dietrich Pates.  The patient will be discharged home  with followup per Dr. Olga Millers and the patient is already  scheduled for a stress Myoview, which is then dated April 27, at 7:30  a.m. and then a follow up with Dr. Olga Millers on May 21, 12:15.  The  patient verbalizes understanding and will continue to take medications  prior to admission and we will make further recommendations post  hospital course.      Bettey Mare. Lyman Bishop, NP      Pricilla Riffle, MD, [facility]  Electronically Signed    KML/MEDQ  D:  08/12/2008  T:  08/12/2008  Job:  [id number]   cc:   Lonzo Cloud. Kriste Basque, MD

## 2010-09-05 NOTE — H&P (Signed)
Sheri Andrews, Sheri Andrews                          ACCOUNT NO.:  [id number]   MEDICAL RECORD NO.:  [id number]                   PATIENT TYPE:  EMS   LOCATION:  ED                                   [facility]AC[facility]  [facility]   PHYSICIAN:  Hedwig Morton. Juanda Chance, M.D. [facility]            DATE OF BIRTH:  [birth year]   DATE OF ADMISSION:  12/15/2001  DATE OF DISCHARGE:                                HISTORY & PHYSICAL   CHIEF COMPLAINT:  Severe abdominal pain followed by cramping and bloody  stools.   HISTORY OF PRESENT ILLNESS:  The patient is a very nice 60 year old white  female known to Dr. Lina Sar generally in good health.  She is status  post bilateral tubal ligation and has chronic reflux.  She has not had any  other significant medical problems.  She had undergone upper endoscopy and  colonoscopy per Dr. Lina Sar in January of 2003.  Colonoscopy was  entirely normal.  Upper endoscopy showed a subtle stricture which was  Maloney dilated and otherwise was unremarkable.   The patient presents at this time with complaint of onset of horrendous  abdominal pain which awakened her from sleep early on the morning of  Wednesday, December 14, 2001.  This was associated with diaphoresis, severe  abdominal cramping, loose bowel movements which were quickly followed by  bloody stools.  The patient's says she has not felt well since that time and  has had progressive weakness.  She says she probably had 12 or 14 bowel  movements yesterday and has had seven bowel movements thus far today.  She  has very little appetite, actually forced herself to vomit once, but  otherwise has not been particularly nauseated or having vomiting.  She has  had no fever or chills.  She says she continues to have ongoing abdominal  cramping which is not nearly as severe as when it initially started and  continues to pass primarily blood with her bowel movements.  She describes  these as mucousy bright red blood with small clots and  very little stool.  The patient had called the office this morning and was advised to come to  the emergency room for evaluation.  She is seen and evaluated in the ER and  felt to be hemodynamically stable.  Hemoglobin 13.2, wbc 6.0.  She is quite  tender in the left mid quadrant and left lower quadrant.  Stool is heme-  positive.  The patient is admitted to the hospital for 24-hour observation  for IV fluid hydration, pain control.  CT scan of the abdomen and pelvis for  probable segmental colonic ischemia.   CURRENT MEDICATIONS:  1. Prempro low dose one p.o. q.d.  2. Zyrtec one p.o. q.d.  3. Aciphex 20 mg p.o. q.d.  4. Zoloft 50 mg q.d.   ALLERGIES:  No known drug allergies.   PAST MEDICAL HISTORY:  This is benign  other than bilateral tubal ligation  and chronic GERD.   SOCIAL HISTORY:  The patient is married and has two grown children.  She is  a nonsmoker, drinks alcohol socially.   FAMILY HISTORY:  This is pertinent for father deceased with coronary artery  disease and congestive heart failure.  Mother alive and well.  No family  history of colon cancer, polyps, or inflammatory bowel disease.   REVIEW OF SYSTEMS:  CARDIOVASCULAR:  Negative.  PULMONARY:  Negative.  GENITOURINARY:  Negative.  GASTROINTESTINAL: As outlined above.   PHYSICAL EXAMINATION:  GENERAL APPEARANCE:  A well-developed white female in  no acute distress.  VITAL SIGNS:  Blood pressure 107/51, pulse in the 90s, temperature 97.  HEENT:  Atraumatic and normocephalic.  EOMI, PERRLA.  Sclerae are anicteric.  NECK:  Supple without nodes.  LUNGS:  Clear to auscultation and percussion.  CARDIOVASCULAR:  Regular rate and rhythm with S1 and S2, slightly tachy, no  murmurs, rubs, or gallops.  ABDOMEN:  Soft, bowel sounds are present.  She is tender suprapubically and  in the left lower quadrant and left mid quadrant.  No guarding or rebound.  No palpable mass or hepatosplenomegaly.  RECTAL:  No stool in the  rectal vault.  Mucus is heme-positive and scant.  EXTREMITIES:  No clubbing, cyanosis, or edema.  NEUROLOGIC:  Grossly nonfocal.   LABORATORY DATA:  As above.  Remainder pending at the time of dictation.   IMPRESSION:  36. A 60 year old white female with acute onset of severe abdominal pain,     cramping and bloody stools with history  most consistent with ischemic     colitis, possibly estrogen related.  Other possibilities, infectious     colitis.  The patient had no diverticula noted at the time of     colonoscopy.  2. Status post bilateral tubal ligation.  3. Chronic gastroesophageal reflux disease.   PLAN:  The patient is admitted to the service of Dr. Lina Sar for IV  fluid hydration, pain control, bowel rest, CT scan of the abdomen and  pelvis.  Hopefully, she will resolve this episode quickly.  Will probably  need to stop her Prempro and will need to investigate other options for her  postmenopausal symptoms once she recovers.      Amy Esterwood, P.A.-C. [facility]                Dora M. Juanda Chance, M.D. [facility]    AE/MEDQ  D:  12/15/2001  T:  12/15/2001  Job:  [id number]

## 2011-02-09 ENCOUNTER — Ambulatory Visit (INDEPENDENT_AMBULATORY_CARE_PROVIDER_SITE_OTHER)
Admission: RE | Admit: 2011-02-09 | Discharge: 2011-02-09 | Disposition: A | Discharge status: Home / Self Care | Payer: PRIVATE HEALTH INSURANCE | Source: Ambulatory Visit | Attending: Adult Health | Admitting: Adult Health

## 2011-02-09 ENCOUNTER — Telehealth: Payer: Self-pay | Admitting: Pulmonary Disease

## 2011-02-09 ENCOUNTER — Ambulatory Visit (INDEPENDENT_AMBULATORY_CARE_PROVIDER_SITE_OTHER): Payer: PRIVATE HEALTH INSURANCE | Admitting: Adult Health

## 2011-02-09 ENCOUNTER — Encounter: Payer: Self-pay | Admitting: *Deleted

## 2011-02-09 VITALS — BP 110/80 | HR 76 | Temp 98.9°F | Ht 63.25 in | Wt 131.0 lb

## 2011-02-09 DIAGNOSIS — J40 Bronchitis, not specified as acute or chronic: Secondary | ICD-10-CM

## 2011-02-09 MED ORDER — HYDROCODONE-HOMATROPINE 5-1.5 MG/5ML PO SYRP: 5.0000 mL | ORAL_SOLUTION | Freq: Four times a day (QID) | ORAL | Status: AC | PRN

## 2011-02-09 MED ORDER — PREDNISONE 10 MG PO TABS: ORAL_TABLET | ORAL | Status: AC

## 2011-02-09 MED ORDER — LEVOFLOXACIN 500 MG PO TABS: 500.0000 mg | ORAL_TABLET | Freq: Every day | ORAL | Status: AC

## 2011-02-09 NOTE — Patient Instructions (Signed)
Levaquin 500mg  daily for 10 days  Mucinex DM Twice daily  As needed  Cough/congeston  Prednisone taper over next week.  Saline nasal rinses As needed   Hydromet 1-2 tsp every 4-6 hr As needed  Cough,may make you sleepy.  I will call with xray  Please contact office for sooner follow up if symptoms do not improve or worsen or seek emergency care  follow up Dr. Kriste Basque  In 4-6 week with physical.

## 2011-02-09 NOTE — Progress Notes (Signed)
Subjective:    Patient ID: Sheri Andrews, female    DOB: 1951-01-25, 60 y.o.   MRN: [id number]  HPI 60  y/o WF  ~ Dec08: she has 2 concerns today: the first is a lingering sinus infection... she has severe allergies and recent incr in nasal congestion, drainage (clear, sl beige) and discomfort in the maxillary area... she saw DrESL and was treated w/ a ZPak and NASONEX and is slowly improving... her second concern is for poss Adult ADD- stating that she can't seem to get anything done & can't finish tasks... never had similiar prob and not prev eval by psychologist etc...   ~ December 19, 2009: last seen by me 12/08- she has had mult specialists tending to her numerous problems- DrDBrodie for GI w/ Barrett's esoph & hx ischemic colitis; DrHenley for GYN; DrShoemaker for ENT w/ LER & "globus" sensation; DrCrenshaw et al for Cards- hx atyp CP, hosp 4/10 w/ neg Myoview ("he released me")... she has been doing relatively well overall but is concerned about "swelling" in her supraclav fossa bilat> exam is neg, no adenopathy, & CXR clear... she is reassured.   02/09/2011 Acute OV  Pt complains of c/o dry cough,  ? tongue swelling, DOE, "no energy" 3 weeks. . Was treated Dr. Corinda Gubler for sinus infection and bronchitis  with zpak last week, round of prednisone x 2 weeks ago. Did not improve. Cough has gotten worse with hoarseness. Lots of drainage. Mouth is very sensitive and rough, tongue feels swollen. Ribs are sore. Wears out easily. No fever . OTC not helping. Recently traveled from [place] 3 weeks ago. No hemotysis.   PMH: Health Maintenance - she is up-to-date w/ GYN= DrHenley...   ALLERGIC RHINITIS (ICD-477.9) - prev allergy eval by DrESL... she's been on allergy shots for 30 yrs + venom shots etc...   Hx of DYSPNEA (ICD-786.05) & OTHER CHEST PAIN (ICD-786.59) - she had a neg cardiac eval by DrBBrodie in 2002... and was adm w/ atyp CP & dizzy during a work out> EKG & Enz were neg, CTA was neg, &  f/u Myoview was neg...  ~ CXR 9/11 was stable- clear, NAD...   HYPERCHOLESTEROLEMIA (ICD-272.0) - on SIMVASTATIN 20mg /d...  ~ FLP 7/08 showed TChol 192, TG 83, HDL 45, LDL 131  ~ FLP 9/11 showed TChol 201, TG 107, HDL 49, LDL 140... rec> incr Simva to 40mg /d...  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   IRRITABLE BOWEL SYNDROME (ICD-564.1) - colonoscopy 1/05 by DrDBrodie showed ?mild colitis, no polyps, no divertics... she had f/u colonoscopy 2/10 w/ hems & some rectal inflamm (Rx AnusolHC suppos)...   Hx of NEPHROLITHIASIS (ICD-592.0) - DrOttelin follows regularly several small non-obstructing renal calculi in kidneys & ? of medullary sponge kidney...   OSTEOPOROSIS (ICD-733.00) - she takes Calcium, MVI, Vit D...  ~ BMD 10/08 at [facility] is improved w/ TScores -0.2 to -1.2  ~ BMD 11/10 showed TScores -1.1 in Spine, and -1.4 in left FemNeck   DYSTHYMIA (ICD-300.4) - she describes panic attack & Rx by DrReadling w/ KLONOPIN ODT 0.25mg  Bid Prn & CELEXA 40mg /d...    Review of Systems Constitutional:   No  weight loss, night sweats,  Fevers, chills, fatigue, or  lassitude.  HEENT:   No headaches,  Difficulty swallowing,  Tooth/dental problems, or  Sore throat,  No sneezing, itching, ear ache, nasal congestion, post nasal drip,   CV:  No chest pain,  Orthopnea, PND, swelling in lower extremities, anasarca, dizziness, palpitations, syncope.   GI  No heartburn, indigestion, abdominal pain, nausea, vomiting, diarrhea, change in bowel habits, loss of appetite, bloody stools.   Resp: No shortness of breath with exertion or at rest.  No excess mucus, no productive cough,  No non-productive cough,  No coughing up of blood.  No change in color of mucus.  No wheezing.  No chest wall deformity  Skin: no rash or lesions.  GU: no dysuria, change in color of urine, no  urgency or frequency.  No flank pain, no hematuria   MS:  No joint pain or swelling.  No decreased range of motion.  No back pain.  Psych:  No change in mood or affect. No depression or anxiety.  No memory loss.         Objective:   Physical Exam GEN: A/Ox3; pleasant , NAD, well nourished   HEENT:  [place]/AT,  EACs-clear, TMs-wnl, NOSE-clear, THROAT-clear, no lesions, no postnasal drip or exudate noted.   NECK:  Supple w/ fair ROM; no JVD; normal carotid impulses w/o bruits; no thyromegaly or nodules palpated; no lymphadenopathy.  RESP  Clear  P & A; w/o, wheezes/ rales/ or rhonchi.no accessory muscle use, no dullness to percussion  CARD:  RRR, no m/r/g  , no peripheral edema, pulses intact, no cyanosis or clubbing.  GI:   Soft & nt; nml bowel sounds; no organomegaly or masses detected.  Musco: Warm bil, no deformities or joint swelling noted.   Neuro: alert, no focal deficits noted.    Skin: Warm, no lesions or rashes         Assessment & Plan:

## 2011-02-09 NOTE — Progress Notes (Signed)
Addended by: Julaine Hua on: 02/09/2011 12:53 PM   Modules accepted: Orders

## 2011-02-09 NOTE — Telephone Encounter (Signed)
I spoke with pt and she states she has had a sinus infection and bronchitis x 2 weeks. Pt states she has been seeing Dr. Corinda Gubler and he has been giving her prednisone and a couple of diff abx but is not getting nay better. Pt c/o labored breathing, tongue swelling, throat is raw. Pt requesting to be worked in to be seen today. Pt last OV 12/30/10 no pending F/U. Please advise Dr. Kriste Basque, thanks  Carver Fila, CMA

## 2011-02-09 NOTE — Telephone Encounter (Signed)
Pt is coming in at 12:00 today to see TP bc pt did not want ENT referral

## 2011-02-09 NOTE — Assessment & Plan Note (Signed)
Slow to resolve bronchitis with associated sinusitis  cxr pending.   Plan:  Levaquin 500mg  daily for 10 days  Mucinex DM Twice daily  As needed  Cough/[place]  Prednisone taper over next week.  Saline nasal rinses As needed   Hydromet 1-2 tsp every 4-6 hr As needed  Cough,may make you sleepy.  I will call with xray  Please contact office for sooner follow up if symptoms do not improve or worsen or seek emergency care  follow up Dr. Kriste Basque  In 4-6 week with physical.

## 2011-02-09 NOTE — Telephone Encounter (Signed)
Per SN---we are booked solid--no openings----can get appt today to see TP or we can refer her to ENT.  thanks

## 2011-02-18 ENCOUNTER — Telehealth: Payer: Self-pay | Admitting: Pulmonary Disease

## 2011-02-18 DIAGNOSIS — J309 Allergic rhinitis, unspecified: Secondary | ICD-10-CM

## 2011-02-18 MED ORDER — MOXIFLOXACIN HCL 400 MG PO TABS: ORAL_TABLET | ORAL | Status: DC

## 2011-02-18 NOTE — Telephone Encounter (Signed)
I spoke with pt and she saw TP last week and is not feeling better. Pt c/o hacking cough, drainage down the back of the throat, ear pressure, face is sore. Pt states she finished up her abx that Tammy gave her. Pt was offered an ENT referral last week but came in for OV to see TP instead. Pt is wanting to know if Dr. Kriste Basque thinks she may need another round of an abx or refer her to ENT. Please advise Dr. Kriste Basque, thanks  Allergies  Allergen Reactions  . Bupropion Hcl     REACTION: WELLBUTRIN caused ANGIOEDEMA  . Doxycycline Hyclate     REACTION: upset  stomach  . Erythromycin Ethylsuccinate     REACTION: upset stomach     Carver Fila, CMA

## 2011-02-18 NOTE — Telephone Encounter (Signed)
I spoke with pt and is aware of SN recs. Pt states she saw Dr. Annalee Genta many years ago and would like to go back to him. Pt states she still would like the abx for avelox called in as well. I advised pt will send rx in for her. Pt verbalized understanding of SN further recs and had no questions. Rx has been sent and order placed for ENT referral to Dr. Annalee Genta

## 2011-02-18 NOTE — Telephone Encounter (Signed)
Per SN---sounds like she needs to see ENT but if she prefers  Ok to call in avelox 400mg   #7  1 TABLET DAILY UNTIL GONE.  Use mucinex 600mg   2 po bid with plenty of fluids and nasal saline spray every 1-2 hours while awake.  thanks

## 2011-02-23 ENCOUNTER — Other Ambulatory Visit: Payer: Self-pay | Admitting: Pulmonary Disease

## 2011-02-26 ENCOUNTER — Telehealth: Payer: Self-pay | Admitting: Adult Health

## 2011-02-26 MED ORDER — BUDESONIDE-FORMOTEROL FUMARATE 160-4.5 MCG/ACT IN AERO: 2.0000 | INHALATION_SPRAY | Freq: Two times a day (BID) | RESPIRATORY_TRACT | Status: DC

## 2011-02-26 NOTE — Telephone Encounter (Signed)
Per SN---ok for pt to have symbicort 160  #1  2 puffs bid---rinse after each use.  This has been sent to the pts pharmacy and i called and spoke with the pt to make her aware.  She stated that she will see if the pharmacy will show her how to use this med, if not she will call me back before she uses this med to make a time to come by the office so i can show her the inhaler technique.

## 2011-02-26 NOTE — Telephone Encounter (Signed)
ENT told the pt that she would need an inhaler---pt stated that her chest is still tight---on 2nd round of avelox.  ENT did not want her to go on another round of pred since-she has already had 2 rounds of pred.  SN please advise if ok to send in inhaler for this pt.  Thanks  Allergies  Allergen Reactions  . Bupropion Hcl     REACTION: WELLBUTRIN caused ANGIOEDEMA  . Doxycycline Hyclate     REACTION: upset  stomach  . Erythromycin Ethylsuccinate     REACTION: upset stomach

## 2011-08-14 ENCOUNTER — Telehealth: Payer: Self-pay | Admitting: Pulmonary Disease

## 2011-08-14 NOTE — Telephone Encounter (Signed)
Spoke with pt and notified ok for Tdap- appt scheduled for 08-17-11 am

## 2011-08-14 NOTE — Telephone Encounter (Signed)
Per SN---ok for the tdap.  thanks

## 2011-08-14 NOTE — Telephone Encounter (Signed)
Pt returned call. Sheri Andrews °

## 2011-08-14 NOTE — Telephone Encounter (Signed)
Spoke with pt. She would like to come in for a Tdap.  States it has been over 10 yrs since her last one.  Dr. Kriste Basque, pls advise if this is ok.  THank you.

## 2011-08-14 NOTE — Telephone Encounter (Signed)
lmomtcb  

## 2011-08-17 ENCOUNTER — Ambulatory Visit (INDEPENDENT_AMBULATORY_CARE_PROVIDER_SITE_OTHER): Payer: PRIVATE HEALTH INSURANCE

## 2011-08-17 DIAGNOSIS — Z23 Encounter for immunization: Secondary | ICD-10-CM

## 2011-09-07 ENCOUNTER — Other Ambulatory Visit: Payer: Self-pay | Admitting: Pulmonary Disease

## 2011-09-10 ENCOUNTER — Encounter: Payer: Self-pay | Admitting: Pulmonary Disease

## 2011-10-12 ENCOUNTER — Encounter: Payer: PRIVATE HEALTH INSURANCE | Admitting: Pulmonary Disease

## 2011-11-30 ENCOUNTER — Other Ambulatory Visit: Payer: Self-pay | Admitting: Pulmonary Disease

## 2012-01-22 ENCOUNTER — Telehealth: Payer: Self-pay | Admitting: Internal Medicine

## 2012-01-26 ENCOUNTER — Encounter: Payer: Self-pay | Admitting: Internal Medicine

## 2012-02-01 ENCOUNTER — Encounter: Payer: Self-pay | Admitting: Pulmonary Disease

## 2012-02-01 ENCOUNTER — Other Ambulatory Visit (INDEPENDENT_AMBULATORY_CARE_PROVIDER_SITE_OTHER): Payer: Commercial Managed Care - PPO

## 2012-02-01 ENCOUNTER — Encounter: Payer: Self-pay | Admitting: *Deleted

## 2012-02-01 ENCOUNTER — Ambulatory Visit (INDEPENDENT_AMBULATORY_CARE_PROVIDER_SITE_OTHER): Payer: Commercial Managed Care - PPO | Admitting: Pulmonary Disease

## 2012-02-01 VITALS — BP 116/82 | HR 100 | Temp 98.2°F | Ht 63.0 in | Wt 123.3 lb

## 2012-02-01 DIAGNOSIS — K2270 Barrett's esophagus without dysplasia: Secondary | ICD-10-CM

## 2012-02-01 DIAGNOSIS — K589 Irritable bowel syndrome without diarrhea: Secondary | ICD-10-CM

## 2012-02-01 DIAGNOSIS — Z0000 Encounter for general adult medical examination without abnormal findings: Secondary | ICD-10-CM

## 2012-02-01 DIAGNOSIS — J309 Allergic rhinitis, unspecified: Secondary | ICD-10-CM

## 2012-02-01 DIAGNOSIS — K219 Gastro-esophageal reflux disease without esophagitis: Secondary | ICD-10-CM

## 2012-02-01 DIAGNOSIS — N200 Calculus of kidney: Secondary | ICD-10-CM

## 2012-02-01 DIAGNOSIS — E7800 Pure hypercholesterolemia, unspecified: Secondary | ICD-10-CM

## 2012-02-01 DIAGNOSIS — M8580 Other specified disorders of bone density and structure, unspecified site: Secondary | ICD-10-CM

## 2012-02-01 DIAGNOSIS — Z23 Encounter for immunization: Secondary | ICD-10-CM

## 2012-02-01 LAB — HEPATIC FUNCTION PANEL
ALT: 12 U/L (ref 0–35)
AST: 22 U/L (ref 0–37)
Albumin: 3.9 g/dL (ref 3.5–5.2)
Alkaline Phosphatase: 38 U/L — ABNORMAL LOW (ref 39–117)
Bilirubin, Direct: 0.1 mg/dL (ref 0.0–0.3)
Total Bilirubin: 0.6 mg/dL (ref 0.3–1.2)
Total Protein: 7.4 g/dL (ref 6.0–8.3)

## 2012-02-01 LAB — BASIC METABOLIC PANEL
BUN: 17 mg/dL (ref 6–23)
CO2: 27 mEq/L (ref 19–32)
Calcium: 9.5 mg/dL (ref 8.4–10.5)
Chloride: 105 mEq/L (ref 96–112)
Creatinine, Ser: 0.9 mg/dL (ref 0.4–1.2)
GFR: 70.16 mL/min (ref >60.00)
Glucose, Bld: 98 mg/dL (ref 70–99)
Potassium: 4.7 mEq/L (ref 3.5–5.1)
Sodium: 142 mEq/L (ref 135–145)

## 2012-02-01 LAB — CBC WITH DIFFERENTIAL/PLATELET
Basophils Absolute: 0 10*3/uL (ref 0.0–0.1)
Basophils Relative: 0.3 % (ref 0.0–3.0)
Eosinophils Absolute: 0 10*3/uL (ref 0.0–0.7)
Eosinophils Relative: 1.1 % (ref 0.0–5.0)
HCT: 39.2 % (ref 36.0–46.0)
Hemoglobin: 13.1 g/dL (ref 12.0–15.0)
Lymphocytes Relative: 43.1 % (ref 12.0–46.0)
Lymphs Abs: 1.7 10*3/uL (ref 0.7–4.0)
MCHC: 33.5 g/dL (ref 30.0–36.0)
MCV: 91.7 fl (ref 78.0–100.0)
Monocytes Absolute: 0.3 10*3/uL (ref 0.1–1.0)
Monocytes Relative: 8.4 % (ref 3.0–12.0)
Neutro Abs: 1.9 10*3/uL (ref 1.4–7.7)
Neutrophils Relative %: 47.1 % (ref 43.0–77.0)
Platelets: 253 10*3/uL (ref 150.0–400.0)
RBC: 4.27 Mil/uL (ref 3.87–5.11)
RDW: 13.2 % (ref 11.5–14.6)
WBC: 3.9 10*3/uL — ABNORMAL LOW (ref 4.5–10.5)

## 2012-02-01 LAB — LIPID PANEL
Cholesterol: 207 mg/dL — ABNORMAL HIGH (ref 0–200)
HDL: 54.3 mg/dL (ref >39.00)
Total CHOL/HDL Ratio: 4
Triglycerides: 112 mg/dL (ref 0.0–149.0)
VLDL: 22.4 mg/dL (ref 0.0–40.0)

## 2012-02-01 LAB — TSH: TSH: 1.72 u[IU]/mL (ref 0.35–5.50)

## 2012-02-01 LAB — LDL CHOLESTEROL, DIRECT: Direct LDL: 149.1 mg/dL

## 2012-02-01 MED ORDER — SIMVASTATIN 40 MG PO TABS: 40.0000 mg | ORAL_TABLET | Freq: Every day | ORAL | Status: DC

## 2012-02-01 MED ORDER — PANTOPRAZOLE SODIUM 40 MG PO TBEC: 40.0000 mg | DELAYED_RELEASE_TABLET | Freq: Two times a day (BID) | ORAL | Status: DC

## 2012-02-01 MED ORDER — CLONAZEPAM 0.25 MG PO TBDP: 0.2500 mg | ORAL_TABLET | ORAL | Status: DC | PRN

## 2012-02-01 NOTE — Patient Instructions (Addendum)
Today we updated your med list in our EPIC system...    Continue your current medications the same...  Today we did your follow up FASTING blood work...    We will call you w/ the results when avail...  We also gave you the 2013 Flu vaccine today...  Call for any problems...    Congrats on your new grandson!!!  Let's plan a follow up exam in 1 year, sooner if needed for problems.Marland KitchenMarland Kitchen

## 2012-02-01 NOTE — Progress Notes (Addendum)
Subjective:     Patient ID: Sheri Andrews, female   DOB: 03-Jun-1950, 61 y.o.   MRN: [id number]  HPI 61 y/o WF here for a yearly physical exam and review of medical issues...   ~  December 19, 2009:  last seen by me 12/08- she has had mult specialists tending to her numerous problems- DrDBrodie for GI w/ Barrett's esoph & hx ischemic colitis;  DrHenley for GYN;  DrShoemaker for ENT w/ LER & "globus" sensation;  DrCrenshaw et al for Cards- hx atyp CP, hosp 4/10 w/ neg Myoview ("he released me")... she has been doing relatively well overall but is concerned about "swelling" in her supraclav fossa bilat> exam is neg, no adenopathy, & CXR clear... she is reassured.  ~  February 01, 2012:  74yr ROV & CPX> Savina saw TP 10/12 for Bronchitis/ Sinusitis & had ENT f/u DrShoemaker w/ 3 rounds of Ab's & consideration of surg but finally better & symptoms resolved...    AR> prev allergy eval by DrESL & she is still on shots from DrVanWinkle now; using OTC antihist etc; ENT per DrShoemaker as noted...    HxDyspnea> prev neg Cards eval by BB in 2002, then again in 2010 w/ atypCP during a work out- studies all neg...    Chol> on Simva40; FLP showed TChol 207, TG 112, HDL 54, LDL 149; looks like Simva not effective, rec switch to Cres20    GERD, Barrett's> on Protonix40Bid; followed by DrDBrodie w/ EGD 11/11 showing Barrett's segment & f/u planned 36yrs...    IBS> last colonoscopy was 2/10 showing some rectal inflamm & Hems treated w/ AnusolHC...    Nephrolithiasis> hx several nonobstructing stones in kidney & ?medullary sponge kidney...    Osteop> on calcium, MVI, VitD; TScore was -1.4 in left [facility] in 2010 & she is due for a follow up...    Dysthymia> on Klonopin0.25Bid prn; off prev Celexa, hx panic attacks... We reviewed prob list, meds, xrays and labs> see below for updates >> OK 2013 Flu vaccine today... EKG 10/13 showed NSR, rate99, wnl, NAD... LABS 10/13:  FLP- not at goals on Simva40;  Chems- wnl;  CBC-  wnl;  TSH=1.72;  VitD=46...  ADDENDUM 05/04/12> pt called for refill Simva40; now admits NOT taking Simva 10/13 at time of labs; wouldn't fill the Cres20 due to cost, NEVER filled the Atorva40 either, just went back on the Simva40 regularly & f/u FLP was improved; she wants to stay on the Simva40 7 this was called into [facility] per request...         Problem List:    Health Maintenance - she is up-to-date w/ GYN= DrHenley but c/o decr drive/ energy/ etc; on Prempro from GYN & asked to discuss Estratest w/ them...  ALLERGIC RHINITIS (ICD-477.9) - prev allergy eval by DrESL... she's been on allergy shots for 30 yrs + venom shots etc...  Hx of DYSPNEA (ICD-786.05) & OTHER CHEST PAIN (ICD-786.59) - she had a neg cardiac eval by DrBBrodie in 2002... and was adm w/ atyp CP & dizzy during a work out> EKG & Enz were neg, CTA was neg, & f/u Myoview was neg... ~  CXR 9/11 was stable- clear, NAD.Marland Kitchen. ~  CXR 10/12 showed norm heart size, biapical pleural scarring, mild degen changes in Tspine... ~  EKG 10/13 showed NSR, rate99, wnl, NAD...  HYPERCHOLESTEROLEMIA (ICD-272.0) - on SIMVASTATIN 40mg /d...  ~  FLP 7/08 showed TChol 192, TG 83, HDL 45, LDL 131 ~  FLP 9/11 on  Simva20 showed TChol 201, TG 107, HDL 49, LDL 140... rec> incr Simva to 40mg /d... ~  FLP 10/13 on Simva40 showed TChol 207, TG 112, HDL 54, LDL 149... rec switch to CRESTOR20  ACID REFLUX DISEASE (ICD-530.81) & BARRETTS ESOPHAGUS (ICD-530.85) - followed by DrDBrodie w/ last EGD 11/09 and Ba Swallow/ UGIS 6/10 per her protocol... ~  EGD 11/11 by DrDBrodie showed irreg Z-line & Bx c/w Barrett's mucosa; rec to incr PROTONIX 40mg Bid...  IRRITABLE BOWEL SYNDROME (ICD-564.1) - colonoscopy 1/05 by DrDBrodie showed ?mild colitis, no polyps, no divertics... she had f/u colonoscopy 2/10 w/ hems & some rectal inflamm (Rx AnusolHC suppos)...  Hx of NEPHROLITHIASIS (ICD-592.0) - DrOttelin follows regularly several small non-obstructing renal calculi in  kidneys & ? of medullary sponge kidney...  OSTEOPOROSIS (ICD-733.00) - she takes Calcium, MVI, Vit D... 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   Past Medical History  Diagnosis Date  . Allergic rhinitis   . Dyspnea   . Other chest pain   . Hypercholesterolemia   . Acid reflux disease   . Barrett esophagus   . Irritable bowel syndrome   . Nephrolithiasis   . Dysthymia     Past Surgical History  Procedure Date  . Tubal ligation   . 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     Outpatient Encounter Prescriptions as of 02/01/2012  Medication Sig Dispense Refill  . calcium carbonate (OS-CAL) 600 MG TABS Take 600 mg by mouth 2 (two) times daily with a meal.        . cholecalciferol (VITAMIN D) 1000 UNITS tablet Take 1,000 Units by mouth daily.        . clonazePAM (KLONOPIN) 0.25 MG disintegrating tablet Take 0.25 mg by mouth as needed.        Marland Kitchen estrogen, conjugated,-medroxyprogesterone (PREMPRO) 0.45-1.5 MG per tablet Take 1 tablet by mouth daily.      . fexofenadine-pseudoephedrine (ALLEGRA-D 24) 180-240 MG per 24 hr tablet Take 1 tablet by mouth daily.      . Magnesium 100 MG CAPS Take 1 capsule by mouth daily.      . Omega-3 Fatty Acids (FISH OIL) 300 MG CAPS Take 1 capsule by mouth.        . pantoprazole (PROTONIX) 40 MG tablet TAKE 1 TABLET BY MOUTH TWICE A DAY  180 tablet  3  . Probiotic Product (PROBIOTIC COLON SUPPORT PO) Take 1 capsule by mouth daily.      . simvastatin (ZOCOR) 40 MG tablet TAKE 1 TABLET BY MOUTH ATBEDTIME  30 tablet  0  . DISCONTD: aspirin 81 MG tablet Take 81 mg by mouth daily.        Marland Kitchen DISCONTD: budesonide-formoterol (SYMBICORT) 160-4.5 MCG/ACT inhaler Inhale 2 puffs into the lungs 2 (two) times daily.  1  Inhaler  11  . DISCONTD: cetirizine (ZYRTEC) 10 MG tablet Take 10 mg by mouth daily.        Marland Kitchen DISCONTD: moxifloxacin (AVELOX) 400 MG tablet Take 1 tablet once a day until gone  7 tablet  0    Allergies  Allergen Reactions  . Bupropion Hcl     REACTION: WELLBUTRIN caused ANGIOEDEMA  . Doxycycline Hyclate  REACTION: upset  stomach  . Erythromycin Ethylsuccinate     REACTION: upset stomach    Current Medications, Allergies, Past Medical History, Past Surgical History, Family History, and Social History were reviewed in [employer] record.   Review of Systems        The patient complains of fatigue, malaise, dyspnea on exertion, anxiety, and hay fever.  The patient denies fever, chills, sweats, anorexia, weakness, weight loss, sleep disorder, blurring, diplopia, eye irritation, eye discharge, vision loss, eye pain, photophobia, earache, ear discharge, tinnitus, decreased hearing, nasal congestion, nosebleeds, sore throat, hoarseness, chest pain, palpitations, syncope, orthopnea, PND, peripheral edema, cough, dyspnea at rest, excessive sputum, hemoptysis, wheezing, pleurisy, nausea, vomiting, diarrhea, constipation, change in bowel habits, abdominal pain, melena, hematochezia, jaundice, gas/bloating, indigestion/heartburn, dysphagia, odynophagia, dysuria, hematuria, urinary frequency, urinary hesitancy, nocturia, incontinence, back pain, joint pain, joint swelling, muscle cramps, muscle weakness, stiffness, arthritis, sciatica, restless legs, leg pain at night, leg pain with exertion, rash, itching, dryness, suspicious lesions, paralysis, paresthesias, seizures, tremors, vertigo, transient blindness, frequent falls, frequent headaches, difficulty walking, depression, memory loss, confusion, cold intolerance, heat intolerance, polydipsia, polyphagia, polyuria, unusual weight change, abnormal bruising, bleeding, enlarged lymph nodes, urticaria, allergic rash, and recurrent  infections.     Objective:   Physical Exam      WD, WN, 61 y/o WF in NAD... GENERAL:  Alert & oriented; pleasant & cooperative... HEENT:  [place]/AT, EOM-wnl, PERRLA, Fundi-benign, EACs-clear, TMs-wnl, NOSE-clear, THROAT-clear & wnl. NECK:  Supple w/ full ROM; no JVD; normal carotid impulses w/o bruits; no thyromegaly or nodules palpated; no lymphadenopathy. CHEST:  Clear to P & A; without wheezes/ rales/ or rhonchi. HEART:  Regular Rhythm; without murmurs/ rubs/ or gallops. ABDOMEN:  Soft & nontender; normal bowel sounds; no organomegaly or masses detected. EXT: without deformities or arthritic changes; no varicose veins/ venous insuffic/ or edema. NEURO:  CN's intact; motor testing normal; sensory testing normal; gait normal & balance OK. DERM:  No lesions noted; no rash etc...  RADIOLOGY DATA:  Reviewed in the EPIC EMR & discussed w/ the patient...  LABORATORY DATA:  Reviewed in the EPIC EMR & discussed w/ the patient...   Assessment:     AR, Sinusitis>  Followed by DrVanWinkle & DrShoemaker; continues on allergy shots and OTC meds prn...  HxDyspnea>  Now resolved; prev panic attacks on Klonopin; no longer using Symbicort...  Hyperchol>  FLP today still not at goals on Simva40; rec change to Crestor20 & f/u FLP in 3 months...  GERD, Barrett's esoph>  Followed by DrDBrodie on ProtonixBid...  IBS>  Stable w/ last colon check in 2010...  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  Hx Osteopenia>  Mild osteopenia on nsupplements & due for f/u BMD...  Dysthymia>  On Klonopin & off prev Celexa doing well by her report....     Plan:     Patient's Medications  New Prescriptions   ATORVASTATIN (LIPITOR) 40 MG TABLET    Take 1 tablet (40 mg total) by mouth daily.  Previous Medications   CALCIUM CARBONATE (OS-CAL) 600 MG TABS    Take 600 mg by mouth 2 (two) times daily with a meal.     CHOLECALCIFEROL (VITAMIN D) 1000 UNITS  TABLET    Take 1,000 Units by mouth daily.     ESTROGEN, CONJUGATED,-MEDROXYPROGESTERONE (PREMPRO) 0.45-1.5 MG PER TABLET    Take 1 tablet by mouth daily.   FEXOFENADINE-PSEUDOEPHEDRINE (ALLEGRA-D 24) 180-240 MG PER 24 HR TABLET  Take 1 tablet by mouth daily.   MAGNESIUM 100 MG CAPS    Take 1 capsule by mouth daily.   OMEGA-3 FATTY ACIDS (FISH OIL) 300 MG CAPS    Take 1 capsule by mouth.     PROBIOTIC PRODUCT (PROBIOTIC COLON SUPPORT PO)    Take 1 capsule by mouth daily.  Modified Medications   Modified Medication Previous Medication   CLONAZEPAM (KLONOPIN) 0.25 MG DISINTEGRATING TABLET clonazePAM (KLONOPIN) 0.25 MG disintegrating tablet      Take 1 tablet (0.25 mg total) by mouth as needed.    Take 0.25 mg by mouth as needed.     PANTOPRAZOLE (PROTONIX) 40 MG TABLET pantoprazole (PROTONIX) 40 MG tablet      Take 1 tablet (40 mg total) by mouth 2 (two) times daily.    TAKE 1 TABLET BY MOUTH TWICE A DAY  Discontinued Medications   ASPIRIN 81 MG TABLET    Take 81 mg by mouth daily.     BUDESONIDE-FORMOTEROL (SYMBICORT) 160-4.5 MCG/ACT INHALER    Inhale 2 puffs into the lungs 2 (two) times daily.   CETIRIZINE (ZYRTEC) 10 MG TABLET    Take 10 mg by mouth daily.     MOXIFLOXACIN (AVELOX) 400 MG TABLET    Take 1 tablet once a day until gone   SIMVASTATIN (ZOCOR) 40 MG TABLET    TAKE 1 TABLET BY MOUTH ATBEDTIME   SIMVASTATIN (ZOCOR) 40 MG TABLET    Take 1 tablet (40 mg total) by mouth at bedtime.

## 2012-02-02 LAB — VITAMIN D 25 HYDROXY (VIT D DEFICIENCY, FRACTURES): Vit D, 25-Hydroxy: 46 ng/mL (ref 30–89)

## 2012-02-04 NOTE — Telephone Encounter (Signed)
Pt scheduled  

## 2012-02-09 ENCOUNTER — Telehealth: Payer: Self-pay | Admitting: Pulmonary Disease

## 2012-02-09 MED ORDER — ROSUVASTATIN CALCIUM 20 MG PO TABS: 20.0000 mg | ORAL_TABLET | Freq: Every day | ORAL | Status: DC

## 2012-02-09 NOTE — Telephone Encounter (Signed)
Called and spoke with pt and she is aware of lab results per SN and pt agreed to try the crestor 20 mg daily.  Pt is aware that she will need to call us back in 3 months to have her labs rechecked on the crestor.  Pt voiced her understanding of these results.

## 2012-02-09 NOTE — Telephone Encounter (Signed)
Notes Recorded by Michele Mcalpine, MD on 02/04/2012 at 8:39 AM Please notify patient>  FLP on Simva40 is still way off w/ LDL=149> confirm dosing, & if compliant then we will need to switch to CRESTOR20/d... Chems/ CBC/ Thyroid/ VitD> all WNL, continue same... REC> f/u FLP on Cres20 in ~94months...  LMTCB

## 2012-02-16 ENCOUNTER — Telehealth: Payer: Self-pay | Admitting: Pulmonary Disease

## 2012-02-16 MED ORDER — ATORVASTATIN CALCIUM 40 MG PO TABS: 40.0000 mg | ORAL_TABLET | Freq: Every day | ORAL | Status: DC

## 2012-02-16 NOTE — Telephone Encounter (Signed)
Pt returned our call.  Call her @ [phone]. Sheri Andrews

## 2012-02-16 NOTE — Telephone Encounter (Signed)
Pt called back and she is aware of SN recs.  Printed out rx for the lipitor(generic) and pt stated that she will finish the simvastatin of what she has left and will be out of town for the next 1 1/2 weeks but will come by her and pick up the rx and the discount card as well. Pt to call back for any questions or concerns.  Nothing further is needed.

## 2012-02-16 NOTE — Telephone Encounter (Signed)
lmomtcb  

## 2012-02-16 NOTE — Telephone Encounter (Signed)
I spoke with pt and she stated she went to get her crestor RX and it was $150 since she has high deductible plan. She is wanting to know if her simvastatin can be increased instead. Was on sim 40 mg daily Please advise Dr. Kriste Basque thanks

## 2012-02-16 NOTE — Telephone Encounter (Signed)
Per SN---simvastatin 40 is the top dose----but atorvastatin 40 mg  Is stronger and we have a copay card to save her some money for this rx---we can send this rx in for her or she can come by and pick this up---she could also try [facility] to see how much this rx would be for her.  lmomtcb for the pt to discuss.

## 2012-02-28 ENCOUNTER — Encounter: Payer: Self-pay | Admitting: Pulmonary Disease

## 2012-03-08 ENCOUNTER — Ambulatory Visit: Payer: Commercial Managed Care - PPO | Admitting: *Deleted

## 2012-03-08 VITALS — Ht 64.0 in | Wt 121.8 lb

## 2012-03-08 DIAGNOSIS — K2270 Barrett's esophagus without dysplasia: Secondary | ICD-10-CM

## 2012-03-11 ENCOUNTER — Encounter: Payer: Self-pay | Admitting: Internal Medicine

## 2012-03-29 ENCOUNTER — Encounter: Payer: Self-pay | Admitting: Internal Medicine

## 2012-03-29 ENCOUNTER — Ambulatory Visit (AMBULATORY_SURGERY_CENTER): Payer: Commercial Managed Care - PPO | Admitting: Internal Medicine

## 2012-03-29 VITALS — BP 130/76 | HR 65 | Temp 96.4°F | Resp 14 | Ht 64.0 in | Wt 121.0 lb

## 2012-03-29 DIAGNOSIS — K2270 Barrett's esophagus without dysplasia: Secondary | ICD-10-CM

## 2012-03-29 DIAGNOSIS — D131 Benign neoplasm of stomach: Secondary | ICD-10-CM

## 2012-03-29 DIAGNOSIS — K3184 Gastroparesis: Secondary | ICD-10-CM

## 2012-03-29 MED ORDER — SODIUM CHLORIDE 0.9 % IV SOLN: 500.0000 mL | INTRAVENOUS | Status: DC

## 2012-03-29 NOTE — Op Note (Signed)
[facility] [address] [place], [address]   ENDOSCOPY PROCEDURE REPORT  PATIENT: Sheri Andrews, Sheri Andrews  MR#: [id number] BIRTHDATE: Feb 26, 1951 , 61  yrs. old GENDER: Female ENDOSCOPIST: Hart Carwin, MD REFERRED BY:  recall EGD PROCEDURE DATE:  03/29/2012 PROCEDURE:  EGD w/ biopsy ASA CLASS:     Class I INDICATIONS:  history of Barrett's esophagus.   goblet cell metaplasia 2009 and 2011. MEDICATIONS: MAC sedation, administered by CRNA and propofol (Diprivan) 200mg  IV TOPICAL ANESTHETIC: Cetacaine Spray  DESCRIPTION OF PROCEDURE: After the risks benefits and alternatives of the procedure were thoroughly explained, informed consent was obtained.  The LB GIF-H180 [id number] endoscope was introduced through the mouth and advanced to the second portion of the duodenum. Without limitations.  The instrument was slowly withdrawn as the mucosa was fully examined.        ESOPHAGUS: There was evidence of Barrett's esophagus at the gastroesophageal junction.  A biopsy was performed using cold forceps.  Sample sent for histology.  STOMACH: Food residue was found.Moderate amount of retained solid food in gastric body, normal gastric outlet  Retroflexed views revealed no abnormalities.     The scope was then withdrawn from the patient and the procedure completed.  COMPLICATIONS: There were no complications. ENDOSCOPIC IMPRESSION: 1.   There was evidence of Barrett's esophagus; biopsy taken  2.   Food residue , moderate amount of retained food  RECOMMENDATIONS:  gastroparesis diet await Path results antreflux measures continue PPI  REPEAT EXAM: pending biopsy results  eSigned:  Hart Carwin, MD 03/29/2012 11:59 AM   CC:  PATIENT NAME:  Sheri Andrews, Sheri Andrews MR#: [id number]

## 2012-03-29 NOTE — Patient Instructions (Addendum)
Handouts were given to your care partner on gastroparesis diet, anti-reflux measures and barrett's esophagus.  Per Dr. Juanda Chance continue taking you anti-reflux medication.  You may also resume your current medications today.  Please call if any questions or concerns.    YOU HAD AN ENDOSCOPIC PROCEDURE TODAY AT THE [facility]: Refer to the procedure report that was given to you for any specific questions about what was found during the examination.  If the procedure report does not answer your questions, please call your gastroenterologist to clarify.  If you requested that your care partner not be given the details of your procedure findings, then the procedure report has been included in a sealed envelope for you to review at your convenience later.  YOU SHOULD EXPECT: Some feelings of bloating in the abdomen. Passage of more gas than usual.  Walking can help get rid of the air that was put into your GI tract during the procedure and reduce the bloating. If you had a lower endoscopy (such as a colonoscopy or flexible sigmoidoscopy) you may notice spotting of blood in your stool or on the toilet paper. If you underwent a bowel prep for your procedure, then you may not have a normal bowel movement for a few days.  DIET: Your first meal following the procedure should be a light meal and then it is ok to progress to your normal diet.  A half-sandwich or bowl of soup is an example of a good first meal.  Heavy or fried foods are harder to digest and may make you feel nauseous or bloated.  Likewise meals heavy in dairy and vegetables can cause extra gas to form and this can also increase the bloating.  Drink plenty of fluids but you should avoid alcoholic beverages for 24 hours.  ACTIVITY: Your care partner should take you home directly after the procedure.  You should plan to take it easy, moving slowly for the rest of the day.  You can resume normal activity the day after the procedure however you  should NOT DRIVE or use heavy machinery for 24 hours (because of the sedation medicines used during the test).    SYMPTOMS TO REPORT IMMEDIATELY: A gastroenterologist can be reached at any hour.  During normal business hours, 8:30 AM to 5:00 PM Monday through Friday, call [phone].  After hours and on weekends, please call the GI answering service at [phone] who will take a message and have the physician on call contact you.     Following upper endoscopy (EGD)  Vomiting of blood or coffee ground material  New chest pain or pain under the shoulder blades  Painful or persistently difficult swallowing  New shortness of breath  Fever of 100F or higher  Black, tarry-looking stools  FOLLOW UP: If any biopsies were taken you will be contacted by phone or by letter within the next 1-3 weeks.  Call your gastroenterologist if you have not heard about the biopsies in 3 weeks.  Our staff will call the home number listed on your records the next business day following your procedure to check on you and address any questions or concerns that you may have at that time regarding the information given to you following your procedure. This is a courtesy call and so if there is no answer at the home number and we have not heard from you through the emergency physician on call, we will assume that you have returned to your regular daily activities without  incident.  SIGNATURES/CONFIDENTIALITY: You and/or your care partner have signed paperwork which will be entered into your electronic medical record.  These signatures attest to the fact that that the information above on your After Visit Summary has been reviewed and is understood.  Full responsibility of the confidentiality of this discharge information lies with you and/or your care-partner.

## 2012-03-29 NOTE — Progress Notes (Signed)
Called to room to assist during endoscopic procedure.  Patient ID and intended procedure confirmed with present staff. Received instructions for my participation in the procedure from the performing physician.  

## 2012-03-29 NOTE — Progress Notes (Signed)
NO COMPLAINTS NOTED IN THE RECOVERY ROOM. MAW  Patient did not experience any of the following events: a burn prior to discharge; a fall within the facility; wrong site/side/patient/procedure/implant event; or a hospital transfer or hospital admission upon discharge from the facility. [phone]) Patient did not have preoperative order for IV antibiotic SSI prophylaxis. [phone])

## 2012-03-30 ENCOUNTER — Telehealth: Payer: Self-pay

## 2012-03-30 NOTE — Telephone Encounter (Signed)
  Follow up Call-  Call back number 03/29/2012  Post procedure Call Back phone  # [phone]  Permission to leave phone message Yes     Patient questions:  Do you have a fever, pain , or abdominal swelling? no Pain Score  0 *  Have you tolerated food without any problems? yes  Have you been able to return to your normal activities? yes  Do you have any questions about your discharge instructions: Diet   no Medications  no Follow up visit  no  Do you have questions or concerns about your Care? no  Actions: * If pain score is 4 or above: No action needed, pain <4.

## 2012-04-04 ENCOUNTER — Encounter: Payer: Self-pay | Admitting: Internal Medicine

## 2012-04-25 ENCOUNTER — Telehealth: Payer: Self-pay | Admitting: Pulmonary Disease

## 2012-04-25 DIAGNOSIS — E7800 Pure hypercholesterolemia, unspecified: Secondary | ICD-10-CM

## 2012-04-25 MED ORDER — PANTOPRAZOLE SODIUM 40 MG PO TBEC: 40.0000 mg | DELAYED_RELEASE_TABLET | Freq: Two times a day (BID) | ORAL | Status: DC

## 2012-04-25 NOTE — Telephone Encounter (Signed)
Pr last labs recs were as follows:Chems/ CBC/ Thyroid/ VitD> all WNL, continue same... REC> f/u FLP on Cres20 in ~67months...  Pt wants to come in for labs this week. Order placed, refill on protonix sent as well. Pt is aware.Carron Curie, CMA

## 2012-05-02 ENCOUNTER — Other Ambulatory Visit (INDEPENDENT_AMBULATORY_CARE_PROVIDER_SITE_OTHER): Payer: Commercial Managed Care - PPO

## 2012-05-02 DIAGNOSIS — E7800 Pure hypercholesterolemia, unspecified: Secondary | ICD-10-CM

## 2012-05-02 LAB — LIPID PANEL
Cholesterol: 164 mg/dL (ref 0–200)
HDL: 58.9 mg/dL (ref >39.00)
LDL Cholesterol: 97 mg/dL (ref 0–99)
Total CHOL/HDL Ratio: 3
Triglycerides: 40 mg/dL (ref 0.0–149.0)
VLDL: 8 mg/dL (ref 0.0–40.0)

## 2012-05-04 ENCOUNTER — Telehealth: Payer: Self-pay | Admitting: Pulmonary Disease

## 2012-05-04 MED ORDER — ATORVASTATIN CALCIUM 40 MG PO TABS: 40.0000 mg | ORAL_TABLET | Freq: Every day | ORAL | Status: DC

## 2012-05-04 NOTE — Telephone Encounter (Signed)
Refill sent. Jennifer Castillo, CMA  

## 2012-05-04 NOTE — Telephone Encounter (Signed)
Per SN----when was the simvastatin last filled and where?  Ok to refill the simvastatin 40 mg 1 daily  #90 to [facility] with 3 refills.  thanks

## 2012-05-04 NOTE — Telephone Encounter (Signed)
i called and spoke with pt about her lab results per SN.  She stated that she has been taking the simvastatin 40 mg daily since her last blood work in [employer].  She is requesting that we send in rx for this to [facility] for 90 day supply.  pts med list states that she is taking the atorvastatin 40 daily.  SN please advise .

## 2012-05-05 ENCOUNTER — Other Ambulatory Visit: Payer: Self-pay | Admitting: Pulmonary Disease

## 2012-06-14 ENCOUNTER — Other Ambulatory Visit: Payer: Self-pay | Admitting: Pulmonary Disease

## 2012-08-04 ENCOUNTER — Telehealth: Payer: Self-pay | Admitting: Pulmonary Disease

## 2012-08-04 NOTE — Telephone Encounter (Signed)
Error

## 2012-08-12 ENCOUNTER — Telehealth: Payer: Self-pay | Admitting: Pulmonary Disease

## 2012-08-12 DIAGNOSIS — E7800 Pure hypercholesterolemia, unspecified: Secondary | ICD-10-CM

## 2012-08-12 MED ORDER — TERBINAFINE HCL 250 MG PO TABS: 250.0000 mg | ORAL_TABLET | Freq: Every day | ORAL | Status: DC

## 2012-08-12 NOTE — Telephone Encounter (Signed)
lmomtcb for pt 

## 2012-08-12 NOTE — Telephone Encounter (Signed)
Last OV 02/01/12. I spoke with the pt and she is requesting an rx for lamisil. She states her husband is on this for a fungus on his toes and she took her polish off on her toes and she has the same fungus on several of her toes as well. Please advise. Carron Curie, CMA Allergies  Allergen Reactions  . Bupropion Hcl     REACTION: WELLBUTRIN caused ANGIOEDEMA  . Doxycycline Hyclate     REACTION: upset  stomach  . Erythromycin Ethylsuccinate     REACTION: upset stomach  ]

## 2012-08-12 NOTE — Telephone Encounter (Signed)
Per SN--  lamisil 250 mg  #30  No refills  1 daily But toenail usually requires at least a 3+ month tx.    Needs to have hepat in 1 month before this can be refilled.    lmomtcb

## 2012-08-12 NOTE — Telephone Encounter (Signed)
Pt called back and she is aware of SN recs.  She will start on the lamisil and this has been sent to her pharmacy.  She is aware to come to our lab in 1 month and check labs and we will let her know of these results and let her know about any further refills of the lamisil.  Nothing further is needed.

## 2012-08-23 ENCOUNTER — Other Ambulatory Visit: Payer: Self-pay | Admitting: Pulmonary Disease

## 2012-08-29 ENCOUNTER — Telehealth: Payer: Self-pay | Admitting: Pulmonary Disease

## 2012-08-29 MED ORDER — CLONAZEPAM 0.25 MG PO TBDP: 0.2500 mg | ORAL_TABLET | Freq: Three times a day (TID) | ORAL | Status: DC | PRN

## 2012-08-29 NOTE — Telephone Encounter (Signed)
Per SN---   We can call in #90 only.  Cannot give a 90 day supply for this.

## 2012-08-29 NOTE — Telephone Encounter (Signed)
Called and spoke with pt and she is aware of rx called to [facility] pharmacy for refills. Nothing further is needed.

## 2012-08-29 NOTE — Telephone Encounter (Signed)
Spoke with pt requesting a 90 day supply for her Klonopin 0.25mg  ><take 1 tablet daily prn   Allergies  Allergen Reactions  . Bupropion Hcl     REACTION: WELLBUTRIN caused ANGIOEDEMA  . Doxycycline Hyclate     REACTION: upset  stomach  . Erythromycin Ethylsuccinate     REACTION: upset stomach   Dr Kriste Basque please advise Thank you

## 2012-09-05 ENCOUNTER — Other Ambulatory Visit: Payer: Self-pay | Admitting: Endodontics

## 2012-09-09 ENCOUNTER — Other Ambulatory Visit (INDEPENDENT_AMBULATORY_CARE_PROVIDER_SITE_OTHER): Payer: Commercial Managed Care - PPO

## 2012-09-09 DIAGNOSIS — E7800 Pure hypercholesterolemia, unspecified: Secondary | ICD-10-CM

## 2012-09-09 LAB — HEPATIC FUNCTION PANEL
ALT: 15 U/L (ref 0–35)
AST: 22 U/L (ref 0–37)
Albumin: 4.2 g/dL (ref 3.5–5.2)
Alkaline Phosphatase: 36 U/L — ABNORMAL LOW (ref 39–117)
Bilirubin, Direct: 0.1 mg/dL (ref 0.0–0.3)
Total Bilirubin: 0.4 mg/dL (ref 0.3–1.2)
Total Protein: 7.4 g/dL (ref 6.0–8.3)

## 2012-09-14 ENCOUNTER — Telehealth: Payer: Self-pay | Admitting: Pulmonary Disease

## 2012-09-14 ENCOUNTER — Other Ambulatory Visit: Payer: Self-pay | Admitting: Pulmonary Disease

## 2012-09-14 MED ORDER — TERBINAFINE HCL 250 MG PO TABS: 250.0000 mg | ORAL_TABLET | Freq: Every day | ORAL | Status: DC

## 2012-09-14 NOTE — Telephone Encounter (Signed)
Called and spoke with pt about her lab results per SN.  Pt is aware of results and is aware that the lamisil will be sent in this time with 1 refill and then the pt will need to be seen.  Pt voiced her understanding of this. Nothing further is needed.

## 2012-11-21 ENCOUNTER — Other Ambulatory Visit: Payer: Self-pay | Admitting: Pulmonary Disease

## 2012-11-24 ENCOUNTER — Telehealth: Payer: Self-pay | Admitting: Pulmonary Disease

## 2012-11-24 MED ORDER — SIMVASTATIN 40 MG PO TABS: ORAL_TABLET | ORAL | Status: DC

## 2012-11-24 NOTE — Telephone Encounter (Signed)
Called, spoke with pt - Rx for simvastatin and lamasil was denied stating pt needs OV.  Pt was last seen by SN on 02/01/12 and was asked to f/u in 1 yr.  I spoke with Leigh.  Lamasil was denied because pt needs to have labs monitored on this.  Pt states she will be leaving to go out of town tomorrow and will need simvastatin rx.  She has a few tabs left of lamisil.  Dr. Kriste Basque, pls advise if pt needs to have labs done prior to having lamasil refilled.  Thank you.  [facility]

## 2012-11-24 NOTE — Telephone Encounter (Signed)
Pt aware. Pt going to call her Dermatologist for further tx. Nothing further needed.

## 2012-11-24 NOTE — Telephone Encounter (Signed)
LMOM x 2 

## 2012-11-24 NOTE — Telephone Encounter (Signed)
Per SN---  lamisil is usually for 3 or 6 month.  Depending on the problem, then you stop this medication.  If not better, then she will need to see dermatology. Do not refill lamisil at this time.  Ok to refill the simvastatin and this has been sent to the pharmacy.    lmomtcb for the pt.

## 2013-06-23 ENCOUNTER — Telehealth: Payer: Self-pay | Admitting: Pulmonary Disease

## 2013-06-23 NOTE — Telephone Encounter (Signed)
lmomtcb x1 

## 2013-06-23 NOTE — Telephone Encounter (Signed)
Per SN----  This office---Dr. Asa Lente [place]---Dr. Maudie Mercury Ask her to check it out on [place]. thanks

## 2013-06-23 NOTE — Telephone Encounter (Signed)
Pt is in the process of changing her PCP. Wants SN's recommendation on who to be referred to.  SN - please advise.

## 2013-06-28 NOTE — Telephone Encounter (Signed)
LMTCBx2. Jennifer Castillo, CMA  

## 2013-06-29 NOTE — Telephone Encounter (Signed)
Returning call.

## 2013-06-30 ENCOUNTER — Telehealth: Payer: Self-pay | Admitting: Pulmonary Disease

## 2013-06-30 MED ORDER — PANTOPRAZOLE SODIUM 40 MG PO TBEC: 40.0000 mg | DELAYED_RELEASE_TABLET | Freq: Two times a day (BID) | ORAL | Status: DC

## 2013-06-30 MED ORDER — CLONAZEPAM 0.25 MG PO TBDP: 0.2500 mg | ORAL_TABLET | Freq: Three times a day (TID) | ORAL | Status: DC | PRN

## 2013-06-30 MED ORDER — SIMVASTATIN 40 MG PO TABS: ORAL_TABLET | ORAL | Status: DC

## 2013-06-30 NOTE — Telephone Encounter (Signed)
Called and made pt aware. Nothing further needed 

## 2013-06-30 NOTE — Telephone Encounter (Signed)
lmtcb x1 

## 2013-06-30 NOTE — Telephone Encounter (Signed)
Called spoke w/ pt. She needed her protonix, simvastatin, clonazepam called into [facility]. i have done so. Nothing further needed

## 2013-07-11 ENCOUNTER — Other Ambulatory Visit: Payer: Self-pay | Admitting: Pulmonary Disease

## 2013-09-12 ENCOUNTER — Telehealth: Payer: Self-pay | Admitting: Pulmonary Disease

## 2013-09-12 NOTE — Telephone Encounter (Signed)
TD will call this pt.

## 2013-09-19 ENCOUNTER — Encounter: Payer: Self-pay | Admitting: *Deleted

## 2013-09-21 NOTE — Telephone Encounter (Signed)
Please advise if this has been taken care of? thanks 

## 2013-09-22 NOTE — Telephone Encounter (Signed)
Leigh, do you know if TD has called this patient?  Thanks!

## 2013-09-25 NOTE — Telephone Encounter (Signed)
Discussed with TD Pt had called back - she is scheduled to see Vertell Novak MD on 9.3.15 Will sign off.

## 2013-09-25 NOTE — Telephone Encounter (Signed)
Will print off phone note and discuss with TD.

## 2013-11-02 ENCOUNTER — Other Ambulatory Visit: Payer: Self-pay | Admitting: *Deleted

## 2013-11-02 MED ORDER — CLONAZEPAM 0.25 MG PO TBDP: 0.2500 mg | ORAL_TABLET | Freq: Three times a day (TID) | ORAL | Status: DC | PRN

## 2013-11-10 ENCOUNTER — Telehealth: Payer: Self-pay | Admitting: Pulmonary Disease

## 2013-11-10 MED ORDER — SIMVASTATIN 40 MG PO TABS: ORAL_TABLET | ORAL | Status: DC

## 2013-11-10 NOTE — Telephone Encounter (Signed)
Pt last had OV w/ SN 02/01/12 Pt has pending appt with new PCP 12/21/13 Dr. Doug Sou. Please advise SN thanks

## 2013-11-10 NOTE — Telephone Encounter (Signed)
Per SN---  Ok for the refill.  thanks

## 2013-11-10 NOTE — Telephone Encounter (Signed)
Refill sent, pt is aware. Sammi Stolarz, CMA  

## 2013-12-21 ENCOUNTER — Ambulatory Visit (INDEPENDENT_AMBULATORY_CARE_PROVIDER_SITE_OTHER): Payer: Commercial Managed Care - PPO | Admitting: Internal Medicine

## 2013-12-21 ENCOUNTER — Encounter: Payer: Self-pay | Admitting: Internal Medicine

## 2013-12-21 ENCOUNTER — Other Ambulatory Visit (INDEPENDENT_AMBULATORY_CARE_PROVIDER_SITE_OTHER): Payer: Commercial Managed Care - PPO

## 2013-12-21 VITALS — BP 116/84 | HR 86 | Temp 98.1°F | Resp 18 | Ht 64.0 in | Wt 122.1 lb

## 2013-12-21 DIAGNOSIS — K589 Irritable bowel syndrome without diarrhea: Secondary | ICD-10-CM

## 2013-12-21 DIAGNOSIS — K5909 Other constipation: Secondary | ICD-10-CM

## 2013-12-21 DIAGNOSIS — E7800 Pure hypercholesterolemia, unspecified: Secondary | ICD-10-CM

## 2013-12-21 DIAGNOSIS — J309 Allergic rhinitis, unspecified: Secondary | ICD-10-CM

## 2013-12-21 DIAGNOSIS — E785 Hyperlipidemia, unspecified: Secondary | ICD-10-CM

## 2013-12-21 DIAGNOSIS — M8580 Other specified disorders of bone density and structure, unspecified site: Secondary | ICD-10-CM

## 2013-12-21 DIAGNOSIS — M899 Disorder of bone, unspecified: Secondary | ICD-10-CM

## 2013-12-21 DIAGNOSIS — F4010 Social phobia, unspecified: Secondary | ICD-10-CM

## 2013-12-21 DIAGNOSIS — M949 Disorder of cartilage, unspecified: Secondary | ICD-10-CM

## 2013-12-21 DIAGNOSIS — K219 Gastro-esophageal reflux disease without esophagitis: Secondary | ICD-10-CM

## 2013-12-21 LAB — CBC
HCT: 41.1 % (ref 36.0–46.0)
Hemoglobin: 13.9 g/dL (ref 12.0–15.0)
MCHC: 33.9 g/dL (ref 30.0–36.0)
MCV: 91.1 fl (ref 78.0–100.0)
Platelets: 256 10*3/uL (ref 150.0–400.0)
RBC: 4.51 Mil/uL (ref 3.87–5.11)
RDW: 12.6 % (ref 11.5–15.5)
WBC: 4 10*3/uL (ref 4.0–10.5)

## 2013-12-21 LAB — BASIC METABOLIC PANEL
BUN: 21 mg/dL (ref 6–23)
CO2: 26 mEq/L (ref 19–32)
Calcium: 9.2 mg/dL (ref 8.4–10.5)
Chloride: 104 mEq/L (ref 96–112)
Creatinine, Ser: 0.9 mg/dL (ref 0.4–1.2)
GFR: 67.93 mL/min (ref >60.00)
Glucose, Bld: 96 mg/dL (ref 70–99)
Potassium: 4.1 mEq/L (ref 3.5–5.1)
Sodium: 138 mEq/L (ref 135–145)

## 2013-12-21 LAB — LIPID PANEL
Cholesterol: 156 mg/dL (ref 0–200)
HDL: 45.3 mg/dL (ref >39.00)
LDL Cholesterol: 97 mg/dL (ref 0–99)
NonHDL: 110.7
Total CHOL/HDL Ratio: 3
Triglycerides: 70 mg/dL (ref 0.0–149.0)
VLDL: 14 mg/dL (ref 0.0–40.0)

## 2013-12-21 MED ORDER — CLONAZEPAM 0.25 MG PO TBDP: 0.2500 mg | ORAL_TABLET | Freq: Every day | ORAL | Status: DC | PRN

## 2013-12-21 MED ORDER — SIMVASTATIN 40 MG PO TABS: ORAL_TABLET | ORAL | Status: DC

## 2013-12-21 MED ORDER — TERBINAFINE HCL 250 MG PO TABS: 250.0000 mg | ORAL_TABLET | Freq: Every day | ORAL | Status: DC

## 2013-12-21 MED ORDER — PANTOPRAZOLE SODIUM 40 MG PO TBEC: 40.0000 mg | DELAYED_RELEASE_TABLET | Freq: Two times a day (BID) | ORAL | Status: DC

## 2013-12-21 NOTE — Assessment & Plan Note (Signed)
Spoke with patient about adequate fluid intake and making benefiber everyday to see if this helps improve her regularity.

## 2013-12-21 NOTE — Assessment & Plan Note (Signed)
As patient tells me that she only uses 4-5 of her clonazepam monthly will refill for #30 with 1 refill which should last until next visit. If this increases in the future would prefer anxiolytic antidepressant rather than strictly clonazepam.

## 2013-12-21 NOTE — Progress Notes (Signed)
Pre visit review using our clinic review tool, if applicable. No additional management support is needed unless otherwise documented below in the visit note. 

## 2013-12-21 NOTE — Assessment & Plan Note (Signed)
Per patient she had bone density scan about 4 years ago and will try to obtain those records. Will plan for repeat next year.

## 2013-12-21 NOTE — Patient Instructions (Addendum)
We have taken an EKG today which looked normal, we will send you to see the heart doctor. We will also check your blood work today and call you with the results. Work on exercising for 30 minutes about 3 times per week.   Work on using the [employer] everyday to help you to be more regular. Be sure to increase the amount of water you are drinking with the fiber to 4-6 glasses of water a day.   We have sent in the medicine for your toenail fungus and want you to take one pill daily for 3 months.   Preventing Toenail Fungus from Recurring   Sanitize your shoes with Mycomist spray or a similar shoe sanitizer spray.  Follow the instructions on the bottle and dry them outside in the sun or with a hairdryer.  We also recommend repeating the sanitization once weekly in shoes you wear most often.   Throw away any shoes you have worn a significant amount without socks-fungus thrives in a warm moist environment and you want to avoid re-infection after your laser procedure   Bleach your socks with regular or color safe bleach   Change your socks regularly to keep your feet clean and dry (especially if you have sweaty feet)-if sweaty feet are a problem, let your doctor know-there is a great lotion that helps with this problem.   Clean your toenail clippers with alcohol before you use them if you do your own toenails and make sure to replace [employer] boards and orange sticks regularly   If you get regular pedicures, bring your own instruments or go to a spa that sterilizes their instruments in an autoclave.  High-Fiber Diet Fiber is found in fruits, vegetables, and grains. A high-fiber diet encourages the addition of more whole grains, legumes, fruits, and vegetables in your diet. The recommended amount of fiber for adult males is 38 g per day. For adult females, it is 25 g per day. Pregnant and lactating women should get 28 g of fiber per day. If you have a digestive or bowel problem, ask your caregiver  for advice before adding high-fiber foods to your diet. Eat a variety of high-fiber foods instead of only a select few type of foods.  PURPOSE  To increase stool bulk.  To make bowel movements more regular to prevent constipation.  To lower cholesterol.  To prevent overeating. WHEN IS THIS DIET USED?  It may be used if you have constipation and hemorrhoids.  It may be used if you have uncomplicated diverticulosis (intestine condition) and irritable bowel syndrome.  It may be used if you need help with weight management.  It may be used if you want to add it to your diet as a protective measure against atherosclerosis, diabetes, and cancer. SOURCES OF FIBER  Whole-grain breads and cereals.  Fruits, such as apples, oranges, bananas, berries, prunes, and pears.  Vegetables, such as green peas, carrots, sweet potatoes, beets, broccoli, cabbage, spinach, and artichokes.  Legumes, such split peas, soy, lentils.  Almonds. FIBER CONTENT IN FOODS Starches and Grains / Dietary Fiber (g)  Cheerios, 1 cup / 3 g  Corn Flakes cereal, 1 cup / 0.7 g  Rice crispy treat cereal, 1 cup / 0.3 g  Instant oatmeal (cooked),  cup / 2 g  Frosted wheat cereal, 1 cup / 5.1 g  Brown, long-grain rice (cooked), 1 cup / 3.5 g  White, long-grain rice (cooked), 1 cup / 0.6 g  Enriched macaroni (cooked), 1 cup /  2.5 g Legumes / Dietary Fiber (g)  Baked beans (canned, plain, or vegetarian),  cup / 5.2 g  Kidney beans (canned),  cup / 6.8 g  Pinto beans (cooked),  cup / 5.5 g Breads and Crackers / Dietary Fiber (g)  Plain or honey graham crackers, 2 squares / 0.7 g  Saltine crackers, 3 squares / 0.3 g  Plain, salted pretzels, 10 pieces / 1.8 g  Whole-wheat bread, 1 slice / 1.9 g  White bread, 1 slice / 0.7 g  Raisin bread, 1 slice / 1.2 g  Plain bagel, 3 oz / 2 g  Flour tortilla, 1 oz / 0.9 g  Corn tortilla, 1 small / 1.5 g  Hamburger or hotdog bun, 1 small / 0.9 g Fruits  / Dietary Fiber (g)  Apple with skin, 1 medium / 4.4 g  Sweetened applesauce,  cup / 1.5 g  Banana,  medium / 1.5 g  Grapes, 10 grapes / 0.4 g  Orange, 1 small / 2.3 g  Raisin, 1.5 oz / 1.6 g  Melon, 1 cup / 1.4 g Vegetables / Dietary Fiber (g)  Green beans (canned),  cup / 1.3 g  Carrots (cooked),  cup / 2.3 g  Broccoli (cooked),  cup / 2.8 g  Peas (cooked),  cup / 4.4 g  Mashed potatoes,  cup / 1.6 g  Lettuce, 1 cup / 0.5 g  Corn (canned),  cup / 1.6 g  Tomato,  cup / 1.1 g Document Released: 04/06/2005 Document Revised: 10/06/2011 Document Reviewed: 07/09/2011 ExitCare Patient Information 2015 [place], [place]. This information is not intended to replace advice given to you by your health care provider. Make sure you discuss any questions you have with your health care provider.

## 2013-12-21 NOTE — Assessment & Plan Note (Signed)
Patient having some mild congestion from her allergies at this time. No need for antibiotics and encouraged her to continue taking her allergy medication at home.

## 2013-12-21 NOTE — Assessment & Plan Note (Signed)
Protonix refilled and encouraged the patient to follow up with GI to see if further surveillance needed for her hx barrett's esophagus.

## 2013-12-21 NOTE — Assessment & Plan Note (Signed)
Encouraged high fiber diet with adequate water intake to help with her bowel motility and if she is having regular bowel movements she will hopefully have less discomfort with bowel movements.

## 2013-12-21 NOTE — Assessment & Plan Note (Signed)
Patient currently taking zocor 40 mg daily and will recheck lipid panel.

## 2013-12-21 NOTE — Progress Notes (Signed)
   Subjective:    Patient ID: DRUANNE BOSQUES, female    DOB: [birth year], 63 y.o.   MRN: [id number]  HPI The patient is a new patient today to this office. She is a 63 YO female with PMH of barrett's esophagus, hyperlipidemia, IBS. She has been having some congestion the last week or so which is clearing up. She denies fever, chills, SOB, sputum. She has long history of allergies and does allergy injections. She states that she normally has bowel movements about every 2-3 days and that they give her discomfort afterwards. She is taking benefiber sometimes but wants to know if she can take it daily. She is working on drinking more water to help with bowel motility. She does have hemorrhoids which bother her infrequently. Overall she is doing well and has no complaints. She does have bouts of social anxiety and uses clonazepam when she needs to go to dentist, stressful social situation etc and uses about 4-5 pills per month. She has had colonoscopy in the past with EGD for her Barrett's and thinks that it is time for her to go back. She goes to OB/Gyn yearly and gets PAP smears yearly and has never had any problems with them. She is not a smoker and has never been. She would like to go see a cardiologist as she has strong family history of heart disease and her sister had a heart attack in July of this year. She denies any palpitations, chest pain, SOB, decreased exercise tolerance. She has had stress test many years ago which was normal.   Review of Systems  Constitutional: Negative for fever, chills, activity change and fatigue.  HENT: Positive for congestion. Negative for sore throat.   Respiratory: Positive for cough. Negative for chest tightness, shortness of breath and wheezing.   Cardiovascular: Negative for chest pain, palpitations and leg swelling.  Gastrointestinal: Negative for abdominal pain, diarrhea and constipation.  Endocrine: Negative for cold intolerance and heat intolerance.    Musculoskeletal: Negative for arthralgias and myalgias.  Skin: Negative for wound.  Allergic/Immunologic: Positive for environmental allergies. Negative for food allergies.  Neurological: Negative for dizziness, syncope, weakness and light-headedness.      Objective:   Physical Exam  Constitutional: She is oriented to person, place, and time. She appears well-developed and well-nourished.  HENT:  Head: Normocephalic and atraumatic.  Eyes: EOM are normal.  Neck: No JVD present.  Cardiovascular: Normal rate and regular rhythm.   No murmur heard. Pulmonary/Chest: Effort normal and breath sounds normal. No respiratory distress. She has no wheezes. She has no rales. She exhibits no tenderness.  Abdominal: Soft. Bowel sounds are normal. She exhibits no distension. There is no tenderness.  Musculoskeletal: She exhibits no tenderness.  Neurological: She is alert and oriented to person, place, and time.  Skin: Skin is warm and dry.    EKG interpretation: Axis normal, intervals normal, rhythm normal, rate normal, no ST or t wave changes    Assessment & Plan:   Patient wishes to wait on getting the flu shot as she is feeling congested today. EKG performed at the clinic which did not have abnormalities. Referral to cardiology placed, labs sent.

## 2013-12-22 ENCOUNTER — Telehealth: Payer: Self-pay | Admitting: Pulmonary Disease

## 2013-12-22 NOTE — Telephone Encounter (Signed)
Patient returned your call regarding her lab results. CB# [phone]

## 2013-12-28 ENCOUNTER — Ambulatory Visit: Payer: Commercial Managed Care - PPO | Admitting: Internal Medicine

## 2014-01-08 ENCOUNTER — Institutional Professional Consult (permissible substitution): Payer: Commercial Managed Care - PPO | Admitting: Cardiovascular Disease

## 2014-01-11 ENCOUNTER — Encounter: Payer: Self-pay | Admitting: Internal Medicine

## 2014-03-09 ENCOUNTER — Ambulatory Visit (INDEPENDENT_AMBULATORY_CARE_PROVIDER_SITE_OTHER): Payer: Commercial Managed Care - PPO | Admitting: Cardiovascular Disease

## 2014-03-09 ENCOUNTER — Encounter: Payer: Self-pay | Admitting: Cardiovascular Disease

## 2014-03-09 VITALS — BP 126/82 | HR 91 | Ht 64.0 in | Wt 125.8 lb

## 2014-03-09 DIAGNOSIS — E7800 Pure hypercholesterolemia, unspecified: Secondary | ICD-10-CM

## 2014-03-09 DIAGNOSIS — Z8249 Family history of ischemic heart disease and other diseases of the circulatory system: Secondary | ICD-10-CM

## 2014-03-09 DIAGNOSIS — R079 Chest pain, unspecified: Secondary | ICD-10-CM

## 2014-03-09 NOTE — Progress Notes (Signed)
Patient ID: Sheri Andrews, female   DOB: [birth year], 63 y.o.   MRN: [id number]    63 yo referred by Dr Doug Sou for family history of CAD and elevated cholesterol on statin  12/21/13 labs reviewed and LDL 97  HDL 45 with normal LFTsl  Reviewed a chest CT she had in 2010 And I noted a small area of calcium in the mid LAD.  Seen by Dr Stanford Breed in 2010 with chest pain Had a normal stress myovue study  Has occasional atypical pain in chest  Her sister just had CABG And sees Dr Debara Pickett      ROS: Denies fever, malais, weight loss, blurry vision, decreased visual acuity, cough, sputum, SOB, hemoptysis, pleuritic pain, palpitaitons, heartburn, abdominal pain, melena, lower extremity edema, claudication, or rash.  All other systems reviewed and negative   General: Affect appropriate Healthy:  appears stated age 61: normal Neck supple with no adenopathy JVP normal no bruits no thyromegaly Lungs clear with no wheezing and good diaphragmatic motion Heart:  S1/S2 no murmur,rub, gallop or click PMI normal Abdomen: benighn, BS positve, no tenderness, no AAA no bruit.  No HSM or HJR Distal pulses intact with no bruits No edema Neuro non-focal Skin warm and dry No muscular weakness  Medications Current Outpatient Prescriptions  Medication Sig Dispense Refill  . aspirin EC 81 MG tablet Take 81 mg by mouth daily.    . calcium carbonate (OS-CAL) 600 MG TABS Take 600 mg by mouth 2 (two) times daily with a meal.      . cholecalciferol (VITAMIN D) 1000 UNITS tablet Take 1,000 Units by mouth daily.      . clonazePAM (KLONOPIN) 0.25 MG disintegrating tablet Take 1 tablet (0.25 mg total) by mouth daily as needed. 30 tablet 1  . estrogen, conjugated,-medroxyprogesterone (PREMPRO) 0.45-1.5 MG per tablet Take 1 tablet by mouth daily.    . fexofenadine-pseudoephedrine (ALLEGRA-D 24) 180-240 MG per 24 hr tablet Take 1 tablet by mouth daily.    . Magnesium 100 MG CAPS Take 1 capsule by mouth daily.    .  pantoprazole (PROTONIX) 40 MG tablet Take 1 tablet (40 mg total) by mouth 2 (two) times daily. 180 tablet 3  . Probiotic Product (PROBIOTIC COLON SUPPORT PO) Take 1 capsule by mouth daily.    . simvastatin (ZOCOR) 40 MG tablet TAKE 1 TABLET BY MOUTH ATBEDTIME 90 tablet 3  . terbinafine (LAMISIL) 250 MG tablet Take 1 tablet (250 mg total) by mouth daily. 30 tablet 2   No current facility-administered medications for this visit.    Allergies Bupropion hcl; Doxycycline hyclate; and Erythromycin ethylsuccinate  Family History: Family History  Problem Relation Age of Onset  . Heart disease Father   . Heart disease Mother   . Coronary artery disease    . Colon cancer Neg Hx   . Stomach cancer Neg Hx     Social History: History   Social History  . Marital Status: Married    Spouse Name: N/A    Number of Children: N/A  . Years of Education: N/A   Occupational History  . Not on file.   Social History Main Topics  . Smoking status: Never Smoker   . Smokeless tobacco: Never Used  . Alcohol Use: 1.0 oz/week    2 drink(s) per week     Comment: social  . Drug Use: No  . Sexual Activity: Not on file   Other Topics Concern  . Not on file   Social History  Narrative  . No narrative on file    Past Surgical History  Procedure Laterality Date  . Tubal ligation  1986  . 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  1995    right    Past Medical History  Diagnosis Date  . Allergic rhinitis   . Dyspnea   . Other chest pain   . Hypercholesterolemia   . Acid reflux disease   . Barrett esophagus   . Irritable bowel syndrome   . Nephrolithiasis   . Dysthymia     Electrocardiogram: 01/17/14  SR normal ECG   Assessment and Plan

## 2014-03-09 NOTE — Assessment & Plan Note (Signed)
Cholesterol is at goal.  Continue current dose of statin and diet Rx.  No myalgias or side effects.  F/U  LFT's in 6 months. Lab Results  Component Value Date   LDLCALC 97 12/21/2013   If calcium score high would intensify statin Rx

## 2014-03-09 NOTE — Assessment & Plan Note (Signed)
Discussed coronary calcium score as screening test.  She is willing to pay $150 out of pocket for t.  ETT given family history and atypical pain to r/o hemodynamically significant disesae  ASA

## 2014-03-09 NOTE — Patient Instructions (Addendum)
Your physician has requested that you have an exercise tolerance test. For further information please visit [email or web address]. Please also follow instruction sheet, as given. CALCIUM SCORE  OUT OF  POCKET  $150.00  Your physician recommends that you continue on your current medications as directed. Please refer to the Current Medication list given to you today.

## 2014-03-14 ENCOUNTER — Inpatient Hospital Stay: Admission: RE | Admit: 2014-03-14 | Payer: Commercial Managed Care - PPO | Source: Ambulatory Visit

## 2014-03-20 ENCOUNTER — Ambulatory Visit (INDEPENDENT_AMBULATORY_CARE_PROVIDER_SITE_OTHER)
Admission: RE | Admit: 2014-03-20 | Discharge: 2014-03-20 | Disposition: A | Discharge status: Home / Self Care | Payer: Self-pay | Source: Ambulatory Visit | Attending: Cardiovascular Disease | Admitting: Cardiovascular Disease

## 2014-03-20 DIAGNOSIS — R079 Chest pain, unspecified: Secondary | ICD-10-CM

## 2014-03-27 ENCOUNTER — Telehealth: Payer: Self-pay | Admitting: Cardiovascular Disease

## 2014-03-27 NOTE — Telephone Encounter (Signed)
LMTCB

## 2014-03-27 NOTE — Telephone Encounter (Signed)
New Message         Pt called and canceled exc tol test and wants to speak with a nurse in regards to if the test is really necessary. Please call back and advise.

## 2014-03-29 NOTE — Telephone Encounter (Signed)
LMTCB ./CY 

## 2014-03-29 NOTE — Telephone Encounter (Signed)
SEE OTHER PHONE NOTE./CY 

## 2014-03-29 NOTE — Telephone Encounter (Signed)
Follow up ° °Patient returning nurses call °

## 2014-03-29 NOTE — Telephone Encounter (Signed)
DISCUSSED  WITH PT  THAT  STRESS TEST   CAN GIVE  OTHER  INFO AS  WELL  LIKE  NORMAL B/P RESPONSE WITH  EXERCISE  AND   RHYTHM  ISSUES  PT  WILL  PROCEED WITH GXT   AS  SCHEDULED .Adonis Housekeeper

## 2014-04-05 ENCOUNTER — Encounter (HOSPITAL_COMMUNITY): Payer: Commercial Managed Care - PPO

## 2014-04-24 ENCOUNTER — Telehealth (HOSPITAL_COMMUNITY): Payer: Self-pay

## 2014-04-24 NOTE — Telephone Encounter (Signed)
Encounter complete. 

## 2014-04-25 ENCOUNTER — Telehealth (HOSPITAL_COMMUNITY): Payer: Self-pay

## 2014-04-25 NOTE — Telephone Encounter (Signed)
Encounter complete. 

## 2014-04-26 ENCOUNTER — Ambulatory Visit (HOSPITAL_COMMUNITY)
Admission: RE | Admit: 2014-04-26 | Discharge: 2014-04-26 | Disposition: A | Discharge status: Home / Self Care | Payer: Commercial Managed Care - PPO | Source: Ambulatory Visit | Attending: Cardiovascular Disease | Admitting: Cardiovascular Disease

## 2014-04-26 DIAGNOSIS — R079 Chest pain, unspecified: Secondary | ICD-10-CM

## 2014-04-26 NOTE — Procedures (Signed)
Exercise Treadmill Test   Test  Exercise Tolerance Test Ordering MD: Jenkins Rouge, MD  Interpreting MD:   Unique Test No:1 Treadmill:  1  Indication for ETT: chest pain - rule out ischemia  Contraindication to ETT: No   Stress Modality: exercise - treadmill  Cardiac Imaging Performed: non   Protocol: standard Bruce - maximal  Max BP:  170/73  Max MPHR (bpm):  156 85% MPR (bpm):  133  MPHR obtained (bpm):  181 % MPHR obtained:  116  Reached 85% MPHR (min:sec):  1:55 Total Exercise Time (min-sec): 10:41  Workload in METS:  12.80 Borg Scale:   Reason ETT Terminated:  fatigue    ST Segment Analysis At Rest: normal ST segments - no evidence of significant ST depression With Exercise: upsloping inferior and lateral ST depression  Other Information Arrhythmia:  No Angina during ETT:  absent (0) Quality of ETT:  diagnostic  ETT Interpretation:  normal - no evidence of ischemia by ST analysis  Comments: <1 mm upsloping inferior and lateral ST depression Excellent exercise tolerance Normal BP response to exercise No evidence for ischemia  Pixie Casino, MD, [facility] Attending Cardiologist [place]

## 2014-07-25 ENCOUNTER — Ambulatory Visit: Payer: Commercial Managed Care - PPO | Admitting: Internal Medicine

## 2014-10-19 ENCOUNTER — Telehealth: Payer: Self-pay | Admitting: Internal Medicine

## 2014-10-19 NOTE — Telephone Encounter (Signed)
Called patient to see if a recent mammogram has been done, I left a voicemail for patient to call back.

## 2014-10-22 ENCOUNTER — Emergency Department (HOSPITAL_COMMUNITY)
Admission: EM | Admit: 2014-10-22 | Discharge: 2014-10-22 | Disposition: A | Discharge status: Home / Self Care | Payer: Commercial Managed Care - PPO | Attending: Emergency Medicine | Admitting: Emergency Medicine

## 2014-10-22 ENCOUNTER — Encounter (HOSPITAL_COMMUNITY): Payer: Self-pay | Admitting: Emergency Medicine

## 2014-10-22 DIAGNOSIS — R238 Other skin changes: Secondary | ICD-10-CM | POA: Insufficient documentation

## 2014-10-22 DIAGNOSIS — Z7982 Long term (current) use of aspirin: Secondary | ICD-10-CM | POA: Insufficient documentation

## 2014-10-22 DIAGNOSIS — R21 Rash and other nonspecific skin eruption: Secondary | ICD-10-CM | POA: Diagnosis present

## 2014-10-22 DIAGNOSIS — Z87442 Personal history of urinary calculi: Secondary | ICD-10-CM | POA: Insufficient documentation

## 2014-10-22 DIAGNOSIS — Z8709 Personal history of other diseases of the respiratory system: Secondary | ICD-10-CM | POA: Insufficient documentation

## 2014-10-22 DIAGNOSIS — K219 Gastro-esophageal reflux disease without esophagitis: Secondary | ICD-10-CM | POA: Insufficient documentation

## 2014-10-22 DIAGNOSIS — E780 Pure hypercholesterolemia: Secondary | ICD-10-CM | POA: Insufficient documentation

## 2014-10-22 DIAGNOSIS — L259 Unspecified contact dermatitis, unspecified cause: Secondary | ICD-10-CM | POA: Diagnosis not present

## 2014-10-22 DIAGNOSIS — Z79899 Other long term (current) drug therapy: Secondary | ICD-10-CM | POA: Insufficient documentation

## 2014-10-22 MED ORDER — DEXAMETHASONE SODIUM PHOSPHATE 10 MG/ML IJ SOLN
10.0000 mg | Freq: Once | INTRAMUSCULAR | Status: AC
  Administered 2014-10-22: 10 mg via INTRAMUSCULAR
  Filled 2014-10-22: qty 1

## 2014-10-22 MED ORDER — PREDNISONE 10 MG PO TABS: ORAL_TABLET | ORAL | Status: DC

## 2014-10-22 NOTE — ED Notes (Signed)
Pt reports that she noted rash and itching 1 week ago. Pt was cleaning a yard with "[employer]". Itching burning rash over whole body. Pt complete one week of prednisone without relief of symptoms

## 2014-10-22 NOTE — ED Provider Notes (Signed)
CSN: [id number]     Arrival date & time 10/22/14  1224 History  This chart was scribed for non-physician practitioner, Jeannett Senior, PA-C working with Serita Grit, MD by Rayna Sexton, ED scribe. This patient was seen in room [place] and the patient's care was started at 12:55 PM.    No chief complaint on file.  The history is provided by the patient. No language interpreter was used.   HPI Comments: Sheri Andrews is a 64 y.o. female who presents to the Emergency Department complaining of a worsening, moderate, generalized rash across her head, extremities and torso with onset 6 days ago. She notes helping a relative pull vines out of garden beds 8 days ago with her rash beginning 2 days later and worsening since onset. Pt notes taking Allegra, Tecnu, 1 week of prednisone and OTC remedies with no relief of her symptoms. She denies any other associated symptoms.   Past Medical History  Diagnosis Date  . Allergic rhinitis   . Dyspnea   . Other chest pain   . Hypercholesterolemia   . Acid reflux disease   . Barrett esophagus   . Irritable bowel syndrome   . Nephrolithiasis   . Dysthymia    Past Surgical History  Procedure Laterality Date  . Tubal ligation  1986  . 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 Heart disease Father   . Heart disease Mother   . Coronary artery disease    . Colon cancer Neg Hx   . Stomach cancer Neg Hx    History  Substance Use Topics  . Smoking status: Never Smoker   . Smokeless tobacco: Never Used  . Alcohol Use: 1.0 oz/week    2 drink(s) per week     Comment: social   OB History    No data available     Review of Systems  Constitutional: Negative for fever and chills.  Skin: Positive for rash. Negative for color change and pallor.  All other systems reviewed and are negative.     Allergies  Bupropion hcl; Doxycycline hyclate; and Erythromycin  ethylsuccinate  Home Medications   Prior to Admission medications   Medication Sig Start Date End Date Taking? Authorizing Provider  aspirin EC 81 MG tablet Take 81 mg by mouth daily.    Historical Provider, MD  calcium carbonate (OS-CAL) 600 MG TABS Take 600 mg by mouth 2 (two) times daily with a meal.      Historical Provider, MD  cholecalciferol (VITAMIN D) 1000 UNITS tablet Take 1,000 Units by mouth daily.      Historical Provider, MD  clonazePAM (KLONOPIN) 0.25 MG disintegrating tablet Take 1 tablet (0.25 mg total) by mouth daily as needed. 12/21/13   Olga Millers, MD  EPIPEN 2-PAK 0.3 MG/0.3ML SOAJ injection  02/25/14   Historical Provider, MD  estrogen, conjugated,-medroxyprogesterone (PREMPRO) 0.45-1.5 MG per tablet Take 1 tablet by mouth daily.    Historical Provider, MD  fexofenadine-pseudoephedrine (ALLEGRA-D 24) 180-240 MG per 24 hr tablet Take 1 tablet by mouth daily.    Historical Provider, MD  pantoprazole (PROTONIX) 40 MG tablet Take 1 tablet (40 mg total) by mouth 2 (two) times daily. 12/21/13   Olga Millers, MD  Probiotic Product (PROBIOTIC COLON SUPPORT PO) Take 1 capsule by mouth daily.    Historical Provider, MD  simvastatin (ZOCOR) 40 MG tablet TAKE 1 TABLET BY  MOUTH ATBEDTIME 12/21/13   Olga Millers, MD  terbinafine (LAMISIL) 250 MG tablet Take 1 tablet (250 mg total) by mouth daily. 12/21/13   Olga Millers, MD   There were no vitals taken for this visit. Physical Exam  Constitutional: She is oriented to person, place, and time. She appears well-developed and well-nourished. No distress.  HENT:  Head: Normocephalic and atraumatic.  Mouth/Throat: Oropharynx is clear and moist.  No eye or oral mucosal involvement  Eyes: Conjunctivae and EOM are normal. Pupils are equal, round, and reactive to light.  Neck: Normal range of motion. Neck supple. No tracheal deviation present.  Cardiovascular: Normal rate.   Pulmonary/Chest: Effort normal and breath sounds  normal. No respiratory distress.  Abdominal: Soft.  Musculoskeletal: Normal range of motion.  Neurological: She is alert and oriented to person, place, and time.  Skin: Skin is warm and dry. Rash noted. She is not diaphoretic. No pallor.  Erythematous, maculopapular confluent rash with central blisters with clear filled fluid, rashes over her bilateral arms, worse over anterior forearms and anterior upper arms, thighs, right fore head. Rash is itchy. No cellulitic changes.  Psychiatric: She has a normal mood and affect. Her behavior is normal.  Nursing note and vitals reviewed.   ED Course  Procedures  DIAGNOSTIC STUDIES: Oxygen Saturation is 95% on RA, adequate by my interpretation.    COORDINATION OF CARE: 1:01 PM Discussed treatment plan with pt at bedside and pt agreed to plan.'  Labs Review Labs Reviewed - No data to display  Imaging Review No results found.   EKG Interpretation None      MDM   Final diagnoses:  Contact dermatitis  Acute skin eruption of discoloration, elevations, blisters     patient with severe case of contact dermatitis, she states from "[place] fine." Extensive rash to bilateral arms, legs, now over the face. There is blistering. She just finished a week taper of prednisone. She is taking intermittently Benadryl or Allegra. Will give a 10 mg IM shot of Decadron the emergency department. I discussed with Dr. Doy Mince, recommended a longer course of prednisone. Continue Benadryl and Allegra, start Pepcid. Wash with soap and water, wash all the sheets and clothing which she states she has already done. Follow-up as needed.  Filed Vitals:   10/22/14 1230 10/22/14 1342  BP: 142/77   Pulse: 111 99  Temp: 98 F (36.7 C)   TempSrc: Oral   Resp: 20   SpO2: 95% 96%   I personally performed the services described in this documentation, which was scribed in my presence. The recorded information has been reviewed and is accurate.    Jeannett Senior, PA-C 10/22/14 1354  Serita Grit, MD 10/24/14 [phone]

## 2014-10-22 NOTE — Discharge Instructions (Signed)
Restart prednisone, take as prescribed until all gone. You can wash your rash areas with gentle soap and water. Take Benadryl and Pepcid daily for itching and reaction. Follow-up with primary care doctor for recheck. Return if worsening. Seen information for poison oak and ivy below, treatment is very similar.   Poison [facility] is an inflammation of the skin (contact dermatitis). It is caused by contact with the allergens on the leaves of the oak (toxicodendron) plants. Depending on your sensitivity, the rash may consist simply of redness and itching, or it may also progress to blisters which may break open (rupture). These must be well cared for to prevent secondary germ (bacterial) infection as these infections can lead to scarring. The eyes may also get puffy. The puffiness is worst in the morning and gets better as the day progresses. Healing is best accomplished by keeping any open areas dry, clean, covered with a bandage, and covered with an antibacterial ointment if needed. Without secondary infection, this dermatitis usually heals without scarring within 2 to 3 weeks without treatment. HOME CARE INSTRUCTIONS When you have been exposed to poison oak, it is very important to thoroughly wash with soap and water as soon as the exposure has been discovered. You have about one half hour to remove the plant resin before it will cause the rash. This cleaning will quickly destroy the oil or antigen on the skin (the antigen is what causes the rash). Wash aggressively under the fingernails as any plant resin still there will continue to spread the rash. Do not rub skin vigorously when washing affected area. Poison oak cannot spread if no oil from the plant remains on your body. Rash that has progressed to weeping sores (lesions) will not spread the rash unless you have not washed thoroughly. It is also important to clean any clothes you have been wearing as they may carry active allergens which will spread  the rash, even several days later. Avoidance of the plant in the future is the best measure. Poison oak plants can be recognized by the number of leaves. Generally, poison oak has three leaves with flowering branches on a single stem. Diphenhydramine may be purchased over the counter and used as needed for itching. Do not drive with this medication if it makes you drowsy. Ask your caregiver about medication for children. SEEK IMMEDIATE MEDICAL CARE IF:   Open areas of the rash develop.  You notice redness extending beyond the area of the rash.  There is a pus like discharge.  There is increased pain.  Other signs of infection develop (such as fever). Document Released: 10/11/2002 Document Revised: 06/29/2011 Document Reviewed: 02/20/2009 [facility] Patient Information 2015 [place], [place]. This information is not intended to replace advice given to you by your health care provider. Make sure you discuss any questions you have with your health care provider.  Poison [employer] ivy is a inflammation of the skin (contact dermatitis) caused by touching the allergens on the leaves of the ivy plant following previous exposure to the plant. The rash usually appears 48 hours after exposure. The rash is usually bumps (papules) or blisters (vesicles) in a linear pattern. Depending on your own sensitivity, the rash may simply cause redness and itching, or it may also progress to blisters which may break open. These must be well cared for to prevent secondary bacterial (germ) infection, followed by scarring. Keep any open areas dry, clean, dressed, and covered with an antibacterial ointment if needed. The eyes may  also get puffy. The puffiness is worst in the morning and gets better as the day progresses. This dermatitis usually heals without scarring, within 2 to 3 weeks without treatment. HOME CARE INSTRUCTIONS  Thoroughly wash with soap and water as soon as you have been exposed to poison ivy. You have  about one half hour to remove the plant resin before it will cause the rash. This washing will destroy the oil or antigen on the skin that is causing, or will cause, the rash. Be sure to wash under your fingernails as any plant resin there will continue to spread the rash. Do not rub skin vigorously when washing affected area. Poison ivy cannot spread if no oil from the plant remains on your body. A rash that has progressed to weeping sores will not spread the rash unless you have not washed thoroughly. It is also important to wash any clothes you have been wearing as these may carry active allergens. The rash will return if you wear the unwashed clothing, even several days later. Avoidance of the plant in the future is the best measure. Poison ivy plant can be recognized by the number of leaves. Generally, poison ivy has three leaves with flowering branches on a single stem. Diphenhydramine may be purchased over the counter and used as needed for itching. Do not drive with this medication if it makes you drowsy.Ask your caregiver about medication for children. SEEK MEDICAL CARE IF:  Open sores develop.  Redness spreads beyond area of rash.  You notice purulent (pus-like) discharge.  You have increased pain.  Other signs of infection develop (such as fever). Document Released: 04/03/2000 Document Revised: 06/29/2011 Document Reviewed: 09/14/2008 [facility] Patient Information 2015 [place], [place]. This information is not intended to replace advice given to you by your health care provider. Make sure you discuss any questions you have with your health care provider.  Contact Dermatitis Contact dermatitis is a reaction to certain substances that touch the skin. Contact dermatitis can be either irritant contact dermatitis or allergic contact dermatitis. Irritant contact dermatitis does not require previous exposure to the substance for a reaction to occur.Allergic contact dermatitis only occurs if you  have been exposed to the substance before. Upon a repeat exposure, your body reacts to the substance.  CAUSES  Many substances can cause contact dermatitis. Irritant dermatitis is most commonly caused by repeated exposure to mildly irritating substances, such as:  Makeup.  Soaps.  Detergents.  Bleaches.  Acids.  Metal salts, such as nickel. Allergic contact dermatitis is most commonly caused by exposure to:  Poisonous plants.  Chemicals (deodorants, shampoos).  Jewelry.  Latex.  Neomycin in triple antibiotic cream.  Preservatives in products, including clothing. SYMPTOMS  The area of skin that is exposed may develop:  Dryness or flaking.  Redness.  Cracks.  Itching.  Pain or a burning sensation.  Blisters. With allergic contact dermatitis, there may also be swelling in areas such as the eyelids, mouth, or genitals.  DIAGNOSIS  Your caregiver can usually tell what the problem is by doing a physical exam. In cases where the cause is uncertain and an allergic contact dermatitis is suspected, a patch skin test may be performed to help determine the cause of your dermatitis. TREATMENT Treatment includes protecting the skin from further contact with the irritating substance by avoiding that substance if possible. Barrier creams, powders, and gloves may be helpful. Your caregiver may also recommend:  Steroid creams or ointments applied 2 times daily. For best results,  soak the rash area in cool water for 20 minutes. Then apply the medicine. Cover the area with a plastic wrap. You can store the steroid cream in the refrigerator for a "chilly" effect on your rash. That may decrease itching. Oral steroid medicines may be needed in more severe cases.  Antibiotics or antibacterial ointments if a skin infection is present.  Antihistamine lotion or an antihistamine taken by mouth to ease itching.  Lubricants to keep moisture in your skin.  Burow's solution to reduce redness  and soreness or to dry a weeping rash. Mix one packet or tablet of solution in 2 cups cool water. Dip a clean washcloth in the mixture, wring it out a bit, and put it on the affected area. Leave the cloth in place for 30 minutes. Do this as often as possible throughout the day.  Taking several cornstarch or baking soda baths daily if the area is too large to cover with a washcloth. Harsh chemicals, such as alkalis or acids, can cause skin damage that is like a burn. You should flush your skin for 15 to 20 minutes with cold water after such an exposure. You should also seek immediate medical care after exposure. Bandages (dressings), antibiotics, and pain medicine may be needed for severely irritated skin.  HOME CARE INSTRUCTIONS  Avoid the substance that caused your reaction.  Keep the area of skin that is affected away from hot water, soap, sunlight, chemicals, acidic substances, or anything else that would irritate your skin.  Do not scratch the rash. Scratching may cause the rash to become infected.  You may take cool baths to help stop the itching.  Only take over-the-counter or prescription medicines as directed by your caregiver.  See your caregiver for follow-up care as directed to make sure your skin is healing properly. SEEK MEDICAL CARE IF:   Your condition is not better after 3 days of treatment.  You seem to be getting worse.  You see signs of infection such as swelling, tenderness, redness, soreness, or warmth in the affected area.  You have any problems related to your medicines. Document Released: 04/03/2000 Document Revised: 06/29/2011 Document Reviewed: 09/09/2010 [facility] Patient Information 2015 [place], [place]. This information is not intended to replace advice given to you by your health care provider. Make sure you discuss any questions you have with your health care provider.

## 2014-10-29 ENCOUNTER — Telehealth: Payer: Self-pay | Admitting: Internal Medicine

## 2014-10-29 ENCOUNTER — Telehealth: Payer: Self-pay

## 2014-10-29 NOTE — Telephone Encounter (Signed)
LMOVM per Dr. Wayne Sever response.

## 2014-10-29 NOTE — Telephone Encounter (Signed)
Patient states she is currently on medication for [place] c[place]per rash.  She would like to know if she needs to reschedule cpe for Thursday until medication is out of system.

## 2014-10-29 NOTE — Telephone Encounter (Signed)
Probably not, but if it would make her feel better that's fine.

## 2014-11-01 ENCOUNTER — Ambulatory Visit (INDEPENDENT_AMBULATORY_CARE_PROVIDER_SITE_OTHER): Payer: Commercial Managed Care - PPO | Admitting: Internal Medicine

## 2014-11-01 ENCOUNTER — Encounter: Payer: Self-pay | Admitting: Internal Medicine

## 2014-11-01 VITALS — BP 106/78 | HR 87 | Temp 98.2°F | Resp 14 | Ht 64.0 in | Wt 125.8 lb

## 2014-11-01 DIAGNOSIS — E7800 Pure hypercholesterolemia, unspecified: Secondary | ICD-10-CM

## 2014-11-01 DIAGNOSIS — F4010 Social phobia, unspecified: Secondary | ICD-10-CM | POA: Diagnosis not present

## 2014-11-01 DIAGNOSIS — E785 Hyperlipidemia, unspecified: Secondary | ICD-10-CM | POA: Diagnosis not present

## 2014-11-01 DIAGNOSIS — Z0000 Encounter for general adult medical examination without abnormal findings: Secondary | ICD-10-CM

## 2014-11-01 MED ORDER — CLONAZEPAM 0.25 MG PO TBDP: 0.2500 mg | ORAL_TABLET | Freq: Every day | ORAL | Status: DC | PRN

## 2014-11-01 MED ORDER — CITALOPRAM HYDROBROMIDE 20 MG PO TABS: 20.0000 mg | ORAL_TABLET | Freq: Every day | ORAL | Status: DC

## 2014-11-01 NOTE — Patient Instructions (Addendum)
We will try the celexa (also called citalopram) for the anxiety and the mood changes. Take 1 pill a day and it may be 2-4 weeks until you notice the full benefits to it. The other thing that can be helpful is to work on taking time just for you to relax and rejuvenate. Please feel free to call us if you have any problems with the medicine or it is not  Helping.   Come back for the labs one morning before you eat and think about getting the pneumonia shot that same day (you can call to get a nurse visit for the pneumonia shot).   Stress and Stress Management Stress is a normal reaction to life events. It is what you feel when life demands more than you are used to or more than you can handle. Some stress can be useful. For example, the stress reaction can help you catch the last bus of the day, study for a test, or meet a deadline at work. But stress that occurs too often or for too long can cause problems. It can affect your emotional health and interfere with relationships and normal daily activities. Too much stress can weaken your immune system and increase your risk for physical illness. If you already have a medical problem, stress can make it worse. CAUSES  All sorts of life events may cause stress. An event that causes stress for one person may not be stressful for another person. Major life events commonly cause stress. These may be positive or negative. Examples include losing your job, moving into a new home, getting married, having a baby, or losing a loved one. Less obvious life events may also cause stress, especially if they occur day after day or in combination. Examples include working long hours, driving in traffic, caring for children, being in debt, or being in a difficult relationship. SIGNS AND SYMPTOMS Stress may cause emotional symptoms including, the following:  Anxiety. This is feeling worried, afraid, on edge, overwhelmed, or out of control.  Anger. This is feeling irritated or  impatient.  Depression. This is feeling sad, down, helpless, or guilty.  Difficulty focusing, remembering, or making decisions. Stress may cause physical symptoms, including the following:   Aches and pains. These may affect your head, neck, back, stomach, or other areas of your body.  Tight muscles or clenched jaw.  Low energy or trouble sleeping. Stress may cause unhealthy behaviors, including the following:   Eating to feel better (overeating) or skipping meals.  Sleeping too little, too much, or both.  Working too much or putting off tasks (procrastination).  Smoking, drinking alcohol, or using drugs to feel better. DIAGNOSIS  Stress is diagnosed through an assessment by your health care provider. Your health care provider will ask questions about your symptoms and any stressful life events.Your health care provider will also ask about your medical history and may order blood tests or other tests. Certain medical conditions and medicine can cause physical symptoms similar to stress. Mental illness can cause emotional symptoms and unhealthy behaviors similar to stress. Your health care provider may refer you to a mental health professional for further evaluation.  TREATMENT  Stress management is the recommended treatment for stress.The goals of stress management are reducing stressful life events and coping with stress in healthy ways.  Techniques for reducing stressful life events include the following:  Stress identification. Self-monitor for stress and identify what causes stress for you. These skills may help you to avoid some stressful  events.  Time management. Set your priorities, keep a calendar of events, and learn to say "no." These tools can help you avoid making too many commitments. Techniques for coping with stress include the following:  Rethinking the problem. Try to think realistically about stressful events rather than ignoring them or overreacting. Try to find  the positives in a stressful situation rather than focusing on the negatives.  Exercise. Physical exercise can release both physical and emotional tension. The key is to find a form of exercise you enjoy and do it regularly.  Relaxation techniques. These relax the body and mind. Examples include yoga, meditation, tai chi, biofeedback, deep breathing, progressive muscle relaxation, listening to music, being out in nature, journaling, and other hobbies. Again, the key is to find one or more that you enjoy and can do regularly.  Healthy lifestyle. Eat a balanced diet, get plenty of sleep, and do not smoke. Avoid using alcohol or drugs to relax.  Strong support network. Spend time with family, friends, or other people you enjoy being around.Express your feelings and talk things over with someone you trust. Counseling or talktherapy with a mental health professional may be helpful if you are having difficulty managing stress on your own. Medicine is typically not recommended for the treatment of stress.Talk to your health care provider if you think you need medicine for symptoms of stress. HOME CARE INSTRUCTIONS  Keep all follow-up visits as directed by your health care provider.  Take all medicines as directed by your health care provider. SEEK MEDICAL CARE IF:  Your symptoms get worse or you start having new symptoms.  You feel overwhelmed by your problems and can no longer manage them on your own. SEEK IMMEDIATE MEDICAL CARE IF:  You feel like hurting yourself or someone else. Document Released: 09/30/2000 Document Revised: 08/21/2013 Document Reviewed: 11/29/2012 [facility] Patient Information 2015 [place], [place]. This information is not intended to replace advice given to you by your health care provider. Make sure you discuss any questions you have with your health care provider.

## 2014-11-01 NOTE — Assessment & Plan Note (Signed)
Checking lipid panel. Adjust therapy as needed. Currently no side effects.

## 2014-11-01 NOTE — Progress Notes (Signed)
   Subjective:    Patient ID: Sheri Andrews, female    DOB: [birth year], 64 y.o.   MRN: [id number]  HPI The patient is a 64 YO female who is coming in to discuss her increasing anxiety and stress. Her husband has been going through major health crises in the last 6 months including 2 major cardiac operations. This has left her the caretaker and with increasing stress and worry about his health. It has taken most of her alone time and fun activities. He is still to have another surgery and she is using clonopin rarely but feels that she needs something everyday to use. Anxiety most of the time and increasing worry. Distracting her from doing things and going places.   Review of Systems  Constitutional: Negative for fever, chills, activity change and fatigue.  Respiratory: Negative for chest tightness, shortness of breath and wheezing.   Cardiovascular: Negative for chest pain, palpitations and leg swelling.  Gastrointestinal: Negative for abdominal pain, diarrhea and constipation.  Musculoskeletal: Negative for myalgias.  Skin: Negative for wound.  Neurological: Negative for dizziness, syncope, weakness and light-headedness.  Psychiatric/Behavioral: Positive for dysphoric mood and decreased concentration. Negative for suicidal ideas, hallucinations, behavioral problems, confusion, sleep disturbance and self-injury. The patient is nervous/anxious.       Objective:   Physical Exam  Constitutional: She is oriented to person, place, and time. She appears well-developed and well-nourished.  HENT:  Head: Normocephalic and atraumatic.  Cardiovascular: Normal rate and regular rhythm.   Pulmonary/Chest: Effort normal and breath sounds normal. No respiratory distress. She has no wheezes.  Abdominal: Soft. She exhibits no distension. There is no tenderness.  Musculoskeletal: She exhibits no tenderness.  Neurological: She is alert and oriented to person, place, and time.  Skin: Skin is warm and dry.    Filed Vitals:   11/01/14 1051  BP: 106/78  Pulse: 87  Temp: 98.2 F (36.8 C)  TempSrc: Oral  Resp: 14  Height: 5\' 4"  (1.626 m)  Weight: 125 lb 12.8 oz (57.063 kg)  SpO2: 98%      Assessment & Plan:

## 2014-11-01 NOTE — Progress Notes (Signed)
Pre visit review using our clinic review tool, if applicable. No additional management support is needed unless otherwise documented below in the visit note. 

## 2014-11-01 NOTE — Assessment & Plan Note (Signed)
Has worsened to generalized anxiety with her husband's recent illnesses. She will try celexa daily and use clonazepam prn for increased anxiety or panic attacks. She will call back with how she is doing in 1-2 months for adjustment of dose if needed.

## 2014-11-09 ENCOUNTER — Other Ambulatory Visit: Payer: Self-pay | Admitting: Geriatric Medicine

## 2014-11-09 ENCOUNTER — Telehealth: Payer: Self-pay | Admitting: Internal Medicine

## 2014-11-09 NOTE — Telephone Encounter (Signed)
Please advise on the instructions and please prescribe the cream. Thanks.

## 2014-11-09 NOTE — Telephone Encounter (Signed)
Patient was in last week and she has poison ivy and was prescribed prednisone. It has not cleared up yet and she was wanting you to call in more prednisone and some cortizone cream for her.  She is out of town and the pharmacy is: [facility]. [place]. [phone] Best number for patient is [phone]

## 2014-11-09 NOTE — Telephone Encounter (Signed)
Ok to refill 

## 2014-11-09 NOTE — Telephone Encounter (Signed)
Is it ok for me to send in the refills? Please advise, thanks.

## 2014-12-28 ENCOUNTER — Other Ambulatory Visit: Payer: Self-pay | Admitting: *Deleted

## 2014-12-28 MED ORDER — SIMVASTATIN 40 MG PO TABS: ORAL_TABLET | ORAL | Status: DC

## 2014-12-28 NOTE — Telephone Encounter (Signed)
Receive call from [facility] pt is requesting refill on her cholesterol med simvastatin 40 mg take 1 at bedtime. Pt had CPX in July ok verbal. Updated epic...Johny Chess

## 2015-01-23 ENCOUNTER — Encounter: Payer: Self-pay | Admitting: Gastroenterology

## 2015-02-06 ENCOUNTER — Other Ambulatory Visit (INDEPENDENT_AMBULATORY_CARE_PROVIDER_SITE_OTHER): Payer: Commercial Managed Care - PPO

## 2015-02-06 DIAGNOSIS — Z0000 Encounter for general adult medical examination without abnormal findings: Secondary | ICD-10-CM

## 2015-02-06 DIAGNOSIS — E785 Hyperlipidemia, unspecified: Secondary | ICD-10-CM

## 2015-02-06 LAB — CBC
HCT: 38.2 % (ref 36.0–46.0)
Hemoglobin: 12.8 g/dL (ref 12.0–15.0)
MCHC: 33.6 g/dL (ref 30.0–36.0)
MCV: 91.3 fl (ref 78.0–100.0)
Platelets: 201 10*3/uL (ref 150.0–400.0)
RBC: 4.18 Mil/uL (ref 3.87–5.11)
RDW: 13.2 % (ref 11.5–15.5)
WBC: 3.7 10*3/uL — ABNORMAL LOW (ref 4.0–10.5)

## 2015-02-06 LAB — COMPREHENSIVE METABOLIC PANEL
ALT: 10 U/L (ref 0–35)
AST: 20 U/L (ref 0–37)
Albumin: 4.2 g/dL (ref 3.5–5.2)
Alkaline Phosphatase: 31 U/L — ABNORMAL LOW (ref 39–117)
BUN: 16 mg/dL (ref 6–23)
CO2: 28 mEq/L (ref 19–32)
Calcium: 9.5 mg/dL (ref 8.4–10.5)
Chloride: 105 mEq/L (ref 96–112)
Creatinine, Ser: 0.9 mg/dL (ref 0.40–1.20)
GFR: 66.82 mL/min (ref >60.00)
Glucose, Bld: 102 mg/dL — ABNORMAL HIGH (ref 70–99)
Potassium: 3.8 mEq/L (ref 3.5–5.1)
Sodium: 140 mEq/L (ref 135–145)
Total Bilirubin: 0.4 mg/dL (ref 0.2–1.2)
Total Protein: 7.1 g/dL (ref 6.0–8.3)

## 2015-02-06 LAB — LIPID PANEL
Cholesterol: 148 mg/dL (ref 0–200)
HDL: 45.6 mg/dL (ref >39.00)
LDL Cholesterol: 84 mg/dL (ref 0–99)
NonHDL: 102.45
Total CHOL/HDL Ratio: 3
Triglycerides: 93 mg/dL (ref 0.0–149.0)
VLDL: 18.6 mg/dL (ref 0.0–40.0)

## 2015-02-06 LAB — TSH: TSH: 1.71 u[IU]/mL (ref 0.35–4.50)

## 2015-02-25 ENCOUNTER — Other Ambulatory Visit: Payer: Self-pay | Admitting: Internal Medicine

## 2015-03-19 ENCOUNTER — Telehealth: Payer: Self-pay | Admitting: Internal Medicine

## 2015-03-19 NOTE — Telephone Encounter (Signed)
Would recommend visit to discuss and review her medications.

## 2015-03-19 NOTE — Telephone Encounter (Signed)
Spoke with patient and she says her hair is falling out and she is wondering if her thyroid could have changed since she had it checked in October. She says her sugar has been running high and she wants to know if she should be concerned. Patient will discuss ovarian cancer test with gyno. Please advise, thanks.

## 2015-03-19 NOTE — Telephone Encounter (Signed)
appt made

## 2015-03-19 NOTE — Telephone Encounter (Signed)
Pt would like to talk with you regarding her lab work that was done back in October. She would also like to have some more tests done. Her hair has been falling out a lot over the past month and she would like her thyroid tested again She also would like a CA125 blood test for ovarian cancer Her sugar seems high and would like to talk with you about that also. Can you please call her at [phone]

## 2015-03-20 ENCOUNTER — Encounter: Payer: Self-pay | Admitting: Gastroenterology

## 2015-03-20 ENCOUNTER — Ambulatory Visit (INDEPENDENT_AMBULATORY_CARE_PROVIDER_SITE_OTHER): Payer: Commercial Managed Care - PPO | Admitting: Gastroenterology

## 2015-03-20 VITALS — BP 116/84 | HR 92 | Ht 63.0 in | Wt 129.0 lb

## 2015-03-20 DIAGNOSIS — R0989 Other specified symptoms and signs involving the circulatory and respiratory systems: Secondary | ICD-10-CM

## 2015-03-20 DIAGNOSIS — K2270 Barrett's esophagus without dysplasia: Secondary | ICD-10-CM

## 2015-03-20 DIAGNOSIS — F458 Other somatoform disorders: Secondary | ICD-10-CM

## 2015-03-20 DIAGNOSIS — R198 Other specified symptoms and signs involving the digestive system and abdomen: Secondary | ICD-10-CM

## 2015-03-20 MED ORDER — PANTOPRAZOLE SODIUM 40 MG PO TBEC: 40.0000 mg | DELAYED_RELEASE_TABLET | Freq: Two times a day (BID) | ORAL | Status: DC

## 2015-03-20 NOTE — Progress Notes (Signed)
     03/20/2015 Sheri Andrews [id number] 31-Oct-1950   History of Present Illness:  This is a 64 year old female who is previously known to Dr. Olevia Perches and followed for her reflux and Barrett's esophagus. Her last EGD was in December 2013 at which time she was seen to have Barrett's esophagus and a moderate amount of retained food in her stomach. Biopsies showed benign squamous mucosa with no evidence of Barrett's, however, she does have at least 2 EGDs in the past prior to that they confirmed Barrett's esophagus on biopsies. She is on pantoprazole 40 mg twice daily. She presents for office today to discuss repeat EGD. She complains of feeling like there is something in her throat, globus sensation, for the past few months. She denies any dysphasia and says that food and liquids do not actually gets stuck. She also has increased belching. She has been under a lot of stress with her husband undergoing 3 major surgeries this year as well as other family issues so she thinks that stress may be aggravating her symptoms.   Current Medications, Allergies, Past Medical History, Past Surgical History, Family History and Social History were reviewed in [facility] record.   Physical Exam: BP 116/84 mmHg  Pulse 92  Ht 5\' 3"  (1.6 m)  Wt 129 lb (58.514 kg)  BMI 22.86 kg/m2 General: Well developed white female in no acute distress Head: Normocephalic and atraumatic Eyes:  Sclerae anicteric, conjunctiva pink  Ears: Normal auditory acuity Lungs: Clear throughout to auscultation Heart: Regular rate and rhythm Abdomen: Soft, non-distended.  Normal bowel sounds.  Non-tender. Musculoskeletal: Symmetrical with no gross deformities  Extremities: No edema  Neurological: Alert oriented x 4, grossly non-focal Psychological:  Alert and cooperative. Normal mood and affect  Assessment and Recommendations: -Barrett's esophagus:  Not seen on biopsies in 2013, but was confirmed on at least 2  EGD's prior to that.  Also having globus sensation and increased belching, which can be due to reflux.  Will schedule for EGD with Dr. Silverio Decamp.  The risks, benefits, and alternatives to EGD were discussed with the patient and she consents to proceed.  Continue pantoprazole 40 mg BID for now; will refill.

## 2015-03-20 NOTE — Patient Instructions (Signed)
You have been scheduled for an endoscopy. Please follow written instructions given to you at your visit today. If you use inhalers (even only as needed), please bring them with you on the day of your procedure. Your physician has requested that you go to www.startemmi.com and enter the access code given to you at your visit today. This web site gives a general overview about your procedure. However, you should still follow specific instructions given to you by our office regarding your preparation for the procedure.  We will refill your pantoprazole today

## 2015-03-21 NOTE — Progress Notes (Signed)
Reviewed and agree with documentation and assessment and plan. If no evidence of barrett's or reflux esophagitis on repeat EGD, can consider decreasing the dose of PPI K. Denzil Magnuson , MD

## 2015-03-28 ENCOUNTER — Ambulatory Visit (INDEPENDENT_AMBULATORY_CARE_PROVIDER_SITE_OTHER): Payer: PPO | Admitting: Internal Medicine

## 2015-03-28 ENCOUNTER — Encounter: Payer: Self-pay | Admitting: Internal Medicine

## 2015-03-28 ENCOUNTER — Other Ambulatory Visit (INDEPENDENT_AMBULATORY_CARE_PROVIDER_SITE_OTHER): Payer: PPO

## 2015-03-28 VITALS — BP 140/90 | HR 83 | Temp 97.4°F | Resp 16 | Ht 63.5 in | Wt 126.0 lb

## 2015-03-28 DIAGNOSIS — R5383 Other fatigue: Secondary | ICD-10-CM

## 2015-03-28 DIAGNOSIS — Z23 Encounter for immunization: Secondary | ICD-10-CM

## 2015-03-28 DIAGNOSIS — F4010 Social phobia, unspecified: Secondary | ICD-10-CM | POA: Diagnosis not present

## 2015-03-28 LAB — T4, FREE: Free T4: 0.61 ng/dL (ref 0.60–1.60)

## 2015-03-28 LAB — TSH: TSH: 1.96 u[IU]/mL (ref 0.35–4.50)

## 2015-03-28 LAB — HEMOGLOBIN A1C: Hgb A1c MFr Bld: 5.5 % (ref 4.6–6.5)

## 2015-03-28 MED ORDER — ESCITALOPRAM OXALATE 10 MG PO TABS
10.0000 mg | ORAL_TABLET | Freq: Every day | ORAL | Status: DC
Start: 2015-03-28 — End: 2015-06-15

## 2015-03-28 NOTE — Progress Notes (Signed)
Pre visit review using our clinic review tool, if applicable. No additional management support is needed unless otherwise documented below in the visit note. 

## 2015-03-28 NOTE — Patient Instructions (Signed)
With the celexa you can take 1 pill every other day for 2 weeks then stop and you can start the new medicine the next day.   The new medicine is called lexapro and is one pill daily. You should start noticing the benefit in 3-4 weeks, call us and let us know if it is working or if the dose needs to be changed.

## 2015-03-29 NOTE — Progress Notes (Signed)
   Subjective:    Patient ID: Sheri Andrews, female    DOB: 1950-10-06, 64 y.o.   MRN: [id number]  HPI The patient is a 64 YO female coming in for medication side effect. She is having some hair loss and is concerned that celexa could have caused it. She started that about 2 months ago and has not noticed much benefit from that. She denies other new problems or side effects. She would also like her blood sugar checked today as diabetes runs in her family.   Review of Systems  Constitutional: Negative for fever, chills, activity change and fatigue.  HENT:       Hair falling out  Respiratory: Negative for chest tightness, shortness of breath and wheezing.   Cardiovascular: Negative for chest pain, palpitations and leg swelling.  Gastrointestinal: Negative for abdominal pain, diarrhea and constipation.  Musculoskeletal: Negative for myalgias.  Skin: Negative for wound.  Neurological: Negative for dizziness, syncope, weakness and light-headedness.  Psychiatric/Behavioral: Positive for dysphoric mood and decreased concentration. Negative for suicidal ideas, hallucinations, behavioral problems, confusion, sleep disturbance and self-injury. The patient is nervous/anxious.       Objective:   Physical Exam  Constitutional: She is oriented to person, place, and time. She appears well-developed and well-nourished.  HENT:  Head: Normocephalic and atraumatic.  Cardiovascular: Normal rate and regular rhythm.   Pulmonary/Chest: Effort normal and breath sounds normal. No respiratory distress. She has no wheezes.  Abdominal: Soft. She exhibits no distension. There is no tenderness.  Musculoskeletal: She exhibits no tenderness.  Neurological: She is alert and oriented to person, place, and time.  Skin: Skin is warm and dry.   Filed Vitals:   03/28/15 1019  BP: 140/90  Pulse: 83  Temp: 97.4 F (36.3 C)  TempSrc: Oral  Resp: 16  Height: 5' 3.5" (1.613 m)  Weight: 126 lb (57.153 kg)  SpO2: 97%        Assessment & Plan:  Flu and prevnar 13 given at visit.

## 2015-03-29 NOTE — Assessment & Plan Note (Signed)
Has not done well with celexa (although on lowest dose) and due to perception of hair loss will change. She is given tapering instructions today and rx for lexapro to try instead for this.

## 2015-04-01 ENCOUNTER — Encounter: Payer: Self-pay | Admitting: Gastroenterology

## 2015-04-01 ENCOUNTER — Ambulatory Visit (AMBULATORY_SURGERY_CENTER): Payer: PPO | Admitting: Gastroenterology

## 2015-04-01 VITALS — BP 120/74 | HR 70 | Temp 97.2°F | Resp 20 | Ht 63.0 in | Wt 129.0 lb

## 2015-04-01 DIAGNOSIS — K2270 Barrett's esophagus without dysplasia: Secondary | ICD-10-CM

## 2015-04-01 MED ORDER — SODIUM CHLORIDE 0.9 % IV SOLN: 500.0000 mL | INTRAVENOUS | Status: DC

## 2015-04-01 MED ORDER — LINACLOTIDE 145 MCG PO CAPS: 145.0000 ug | ORAL_CAPSULE | Freq: Every day | ORAL | Status: DC

## 2015-04-01 NOTE — Progress Notes (Signed)
Called to room to assist during endoscopic procedure.  Patient ID and intended procedure confirmed with present staff. Received instructions for my participation in the procedure from the performing physician.  

## 2015-04-01 NOTE — Progress Notes (Signed)
Report to PACU, RN, vss, BBS= Clear.  

## 2015-04-01 NOTE — Op Note (Addendum)
[place]  [address]. [place], [address]   ENDOSCOPY PROCEDURE REPORT  PATIENT: Sheri Andrews, Sheri Andrews  MR#: [id number] BIRTHDATE: May 09, 1950 , 30  yrs. old GENDER: female ENDOSCOPIST: Harl Bowie, MD REFERRED BY:  Luane School, M.D. PROCEDURE DATE:  04/01/2015 PROCEDURE:  EGD w/ biopsy for H.pylori ASA CLASS:     Class II INDICATIONS:  screening for Barrett's and heartburn. MEDICATIONS: Propofol 130 IV TOPICAL ANESTHETIC: none  DESCRIPTION OF PROCEDURE: After the risks benefits and alternatives of the procedure were thoroughly explained, informed consent was obtained.  The [id number] endoscope was introduced through the mouth and advanced to the second portion of the duodenum , Without limitations.  The instrument was slowly withdrawn as the mucosa was fully examined.    Distal esophageal stricture, able to pass the scope without any difficulty, wide caliber.  Sliding hiatl hernia.  Diffuse gastropathy with atrophic gastric folds, random gastric biopsies were obtained.  Duodenum appeared normal.  Retroflexed views revealed as previously described.     The scope was then withdrawn from the patient and the procedure completed.  COMPLICATIONS: There were no immediate complications.  ENDOSCOPIC IMPRESSION: Distal esophageal stricture, able to pass the scope without any difficulty, wide caliber.  Sliding hiatl hernia.  Diffuse gastropathy with atrophic gastric folds, random gastric biopsies were obtained.  Duodenum appeared normal  RECOMMENDATIONS: 1.  Await biopsy results 2.  Anti-reflux regimen to be follow 3.  Avoid NSAIDS 4. Continue PPI twice daily 5. Start Linzess 110mcg daily 6.Follow up in office visit  eSigned:  Harl Bowie, MD 04/01/2015 4:11 PM Revised: 04/01/2015 4:11 PM

## 2015-04-01 NOTE — Patient Instructions (Signed)
Discharge instructions given. Biopsies taken. Avoid NSAIDS. Resume previous medications. YOU HAD AN ENDOSCOPIC PROCEDURE TODAY AT THE [facility]:   Refer to the procedure report that was given to you for any specific questions about what was found during the examination.  If the procedure report does not answer your questions, please call your gastroenterologist to clarify.  If you requested that your care partner not be given the details of your procedure findings, then the procedure report has been included in a sealed envelope for you to review at your convenience later.  YOU SHOULD EXPECT: Some feelings of bloating in the abdomen. Passage of more gas than usual.  Walking can help get rid of the air that was put into your GI tract during the procedure and reduce the bloating. If you had a lower endoscopy (such as a colonoscopy or flexible sigmoidoscopy) you may notice spotting of blood in your stool or on the toilet paper. If you underwent a bowel prep for your procedure, you may not have a normal bowel movement for a few days.  Please Note:  You might notice some irritation and congestion in your nose or some drainage.  This is from the oxygen used during your procedure.  There is no need for concern and it should clear up in a day or so.  SYMPTOMS TO REPORT IMMEDIATELY:    Following upper endoscopy (EGD)  Vomiting of blood or coffee ground material  New chest pain or pain under the shoulder blades  Painful or persistently difficult swallowing  New shortness of breath  Fever of 100F or higher  Black, tarry-looking stools  For urgent or emergent issues, a gastroenterologist can be reached at any hour by calling [phone].   DIET: Your first meal following the procedure should be a small meal and then it is ok to progress to your normal diet. Heavy or fried foods are harder to digest and may make you feel nauseous or bloated.  Likewise, meals heavy in dairy and  vegetables can increase bloating.  Drink plenty of fluids but you should avoid alcoholic beverages for 24 hours.  ACTIVITY:  You should plan to take it easy for the rest of today and you should NOT DRIVE or use heavy machinery until tomorrow (because of the sedation medicines used during the test).    FOLLOW UP: Our staff will call the number listed on your records the next business day following your procedure to check on you and address any questions or concerns that you may have regarding the information given to you following your procedure. If we do not reach you, we will leave a message.  However, if you are feeling well and you are not experiencing any problems, there is no need to return our call.  We will assume that you have returned to your regular daily activities without incident.  If any biopsies were taken you will be contacted by phone or by letter within the next 1-3 weeks.  Please call us at [phone] if you have not heard about the biopsies in 3 weeks.    SIGNATURES/CONFIDENTIALITY: You and/or your care partner have signed paperwork which will be entered into your electronic medical record.  These signatures attest to the fact that that the information above on your After Visit Summary has been reviewed and is understood.  Full responsibility of the confidentiality of this discharge information lies with you and/or your care-partner.  

## 2015-04-02 ENCOUNTER — Telehealth: Payer: Self-pay

## 2015-04-02 NOTE — Telephone Encounter (Signed)
  Follow up Call-  Call back number 04/01/2015  Post procedure Call Back phone  # [phone]  Permission to leave phone message Yes     Patient was called for follow up from procedure on 04-01-15. No answer at number given, and no message machine came on to leave a message.

## 2015-04-09 ENCOUNTER — Encounter: Payer: Self-pay | Admitting: Gastroenterology

## 2015-04-23 DIAGNOSIS — J301 Allergic rhinitis due to pollen: Secondary | ICD-10-CM | POA: Diagnosis not present

## 2015-04-23 DIAGNOSIS — J3089 Other allergic rhinitis: Secondary | ICD-10-CM | POA: Diagnosis not present

## 2015-05-20 DIAGNOSIS — J3089 Other allergic rhinitis: Secondary | ICD-10-CM | POA: Diagnosis not present

## 2015-05-20 DIAGNOSIS — J301 Allergic rhinitis due to pollen: Secondary | ICD-10-CM | POA: Diagnosis not present

## 2015-06-15 ENCOUNTER — Other Ambulatory Visit: Payer: Self-pay | Admitting: Internal Medicine

## 2015-06-20 ENCOUNTER — Other Ambulatory Visit: Payer: Self-pay | Admitting: Internal Medicine

## 2015-06-21 DIAGNOSIS — J301 Allergic rhinitis due to pollen: Secondary | ICD-10-CM | POA: Diagnosis not present

## 2015-06-21 DIAGNOSIS — J3089 Other allergic rhinitis: Secondary | ICD-10-CM | POA: Diagnosis not present

## 2015-07-18 DIAGNOSIS — J301 Allergic rhinitis due to pollen: Secondary | ICD-10-CM | POA: Diagnosis not present

## 2015-07-18 DIAGNOSIS — J3089 Other allergic rhinitis: Secondary | ICD-10-CM | POA: Diagnosis not present

## 2015-08-01 ENCOUNTER — Other Ambulatory Visit: Payer: Self-pay | Admitting: Internal Medicine

## 2015-08-16 DIAGNOSIS — J3089 Other allergic rhinitis: Secondary | ICD-10-CM | POA: Diagnosis not present

## 2015-08-16 DIAGNOSIS — J301 Allergic rhinitis due to pollen: Secondary | ICD-10-CM | POA: Diagnosis not present

## 2015-08-29 ENCOUNTER — Other Ambulatory Visit: Payer: Self-pay | Admitting: Internal Medicine

## 2015-09-19 DIAGNOSIS — J301 Allergic rhinitis due to pollen: Secondary | ICD-10-CM | POA: Diagnosis not present

## 2015-09-19 DIAGNOSIS — J3089 Other allergic rhinitis: Secondary | ICD-10-CM | POA: Diagnosis not present

## 2015-10-30 DIAGNOSIS — J301 Allergic rhinitis due to pollen: Secondary | ICD-10-CM | POA: Diagnosis not present

## 2015-10-30 DIAGNOSIS — J3089 Other allergic rhinitis: Secondary | ICD-10-CM | POA: Diagnosis not present

## 2015-11-11 DIAGNOSIS — J3089 Other allergic rhinitis: Secondary | ICD-10-CM | POA: Diagnosis not present

## 2015-11-11 DIAGNOSIS — H1045 Other chronic allergic conjunctivitis: Secondary | ICD-10-CM | POA: Diagnosis not present

## 2015-11-11 DIAGNOSIS — J301 Allergic rhinitis due to pollen: Secondary | ICD-10-CM | POA: Diagnosis not present

## 2015-12-02 DIAGNOSIS — J3089 Other allergic rhinitis: Secondary | ICD-10-CM | POA: Diagnosis not present

## 2015-12-02 DIAGNOSIS — J301 Allergic rhinitis due to pollen: Secondary | ICD-10-CM | POA: Diagnosis not present

## 2015-12-24 ENCOUNTER — Other Ambulatory Visit: Payer: Self-pay | Admitting: Internal Medicine

## 2015-12-24 NOTE — Telephone Encounter (Signed)
Faxed to pharmacy

## 2015-12-25 ENCOUNTER — Other Ambulatory Visit: Payer: Self-pay | Admitting: Internal Medicine

## 2015-12-26 DIAGNOSIS — J301 Allergic rhinitis due to pollen: Secondary | ICD-10-CM | POA: Diagnosis not present

## 2015-12-26 DIAGNOSIS — J3089 Other allergic rhinitis: Secondary | ICD-10-CM | POA: Diagnosis not present

## 2015-12-30 DIAGNOSIS — J3089 Other allergic rhinitis: Secondary | ICD-10-CM | POA: Diagnosis not present

## 2015-12-30 DIAGNOSIS — J301 Allergic rhinitis due to pollen: Secondary | ICD-10-CM | POA: Diagnosis not present

## 2016-01-28 DIAGNOSIS — M19011 Primary osteoarthritis, right shoulder: Secondary | ICD-10-CM | POA: Diagnosis not present

## 2016-01-28 DIAGNOSIS — J301 Allergic rhinitis due to pollen: Secondary | ICD-10-CM | POA: Diagnosis not present

## 2016-01-28 DIAGNOSIS — J3089 Other allergic rhinitis: Secondary | ICD-10-CM | POA: Diagnosis not present

## 2016-01-28 DIAGNOSIS — M542 Cervicalgia: Secondary | ICD-10-CM | POA: Diagnosis not present

## 2016-01-30 DIAGNOSIS — J3089 Other allergic rhinitis: Secondary | ICD-10-CM | POA: Diagnosis not present

## 2016-01-30 DIAGNOSIS — M25611 Stiffness of right shoulder, not elsewhere classified: Secondary | ICD-10-CM | POA: Diagnosis not present

## 2016-01-30 DIAGNOSIS — J301 Allergic rhinitis due to pollen: Secondary | ICD-10-CM | POA: Diagnosis not present

## 2016-01-30 DIAGNOSIS — M25511 Pain in right shoulder: Secondary | ICD-10-CM | POA: Diagnosis not present

## 2016-02-03 DIAGNOSIS — M25611 Stiffness of right shoulder, not elsewhere classified: Secondary | ICD-10-CM | POA: Diagnosis not present

## 2016-02-03 DIAGNOSIS — J301 Allergic rhinitis due to pollen: Secondary | ICD-10-CM | POA: Diagnosis not present

## 2016-02-03 DIAGNOSIS — M25511 Pain in right shoulder: Secondary | ICD-10-CM | POA: Diagnosis not present

## 2016-02-03 DIAGNOSIS — J3089 Other allergic rhinitis: Secondary | ICD-10-CM | POA: Diagnosis not present

## 2016-02-05 DIAGNOSIS — M25511 Pain in right shoulder: Secondary | ICD-10-CM | POA: Diagnosis not present

## 2016-02-05 DIAGNOSIS — J3089 Other allergic rhinitis: Secondary | ICD-10-CM | POA: Diagnosis not present

## 2016-02-05 DIAGNOSIS — M25611 Stiffness of right shoulder, not elsewhere classified: Secondary | ICD-10-CM | POA: Diagnosis not present

## 2016-02-05 DIAGNOSIS — J301 Allergic rhinitis due to pollen: Secondary | ICD-10-CM | POA: Diagnosis not present

## 2016-02-12 DIAGNOSIS — M25611 Stiffness of right shoulder, not elsewhere classified: Secondary | ICD-10-CM | POA: Diagnosis not present

## 2016-02-12 DIAGNOSIS — M25511 Pain in right shoulder: Secondary | ICD-10-CM | POA: Diagnosis not present

## 2016-02-13 ENCOUNTER — Other Ambulatory Visit: Payer: Self-pay | Admitting: Orthopedic Surgery

## 2016-02-13 DIAGNOSIS — M25511 Pain in right shoulder: Secondary | ICD-10-CM

## 2016-02-13 DIAGNOSIS — J3089 Other allergic rhinitis: Secondary | ICD-10-CM | POA: Diagnosis not present

## 2016-02-13 DIAGNOSIS — J301 Allergic rhinitis due to pollen: Secondary | ICD-10-CM | POA: Diagnosis not present

## 2016-02-20 DIAGNOSIS — J301 Allergic rhinitis due to pollen: Secondary | ICD-10-CM | POA: Diagnosis not present

## 2016-02-20 DIAGNOSIS — J3089 Other allergic rhinitis: Secondary | ICD-10-CM | POA: Diagnosis not present

## 2016-02-23 ENCOUNTER — Other Ambulatory Visit: Payer: Self-pay | Admitting: Internal Medicine

## 2016-02-24 ENCOUNTER — Other Ambulatory Visit: Payer: Self-pay | Admitting: Orthopedic Surgery

## 2016-02-24 DIAGNOSIS — J301 Allergic rhinitis due to pollen: Secondary | ICD-10-CM | POA: Diagnosis not present

## 2016-02-24 DIAGNOSIS — J3089 Other allergic rhinitis: Secondary | ICD-10-CM | POA: Diagnosis not present

## 2016-02-24 DIAGNOSIS — M79621 Pain in right upper arm: Secondary | ICD-10-CM

## 2016-02-24 DIAGNOSIS — M898X2 Other specified disorders of bone, upper arm: Secondary | ICD-10-CM

## 2016-02-25 ENCOUNTER — Ambulatory Visit
Admission: RE | Admit: 2016-02-25 | Discharge: 2016-02-25 | Disposition: A | Discharge status: Home / Self Care | Payer: PPO | Source: Ambulatory Visit | Attending: Orthopedic Surgery | Admitting: Orthopedic Surgery

## 2016-02-25 DIAGNOSIS — M25512 Pain in left shoulder: Secondary | ICD-10-CM | POA: Diagnosis not present

## 2016-02-25 DIAGNOSIS — M25511 Pain in right shoulder: Secondary | ICD-10-CM

## 2016-03-05 ENCOUNTER — Ambulatory Visit
Admission: RE | Admit: 2016-03-05 | Discharge: 2016-03-05 | Disposition: A | Discharge status: Home / Self Care | Payer: PPO | Source: Ambulatory Visit | Attending: Orthopedic Surgery | Admitting: Orthopedic Surgery

## 2016-03-05 DIAGNOSIS — J301 Allergic rhinitis due to pollen: Secondary | ICD-10-CM | POA: Diagnosis not present

## 2016-03-05 DIAGNOSIS — M898X2 Other specified disorders of bone, upper arm: Secondary | ICD-10-CM

## 2016-03-05 DIAGNOSIS — J3089 Other allergic rhinitis: Secondary | ICD-10-CM | POA: Diagnosis not present

## 2016-03-05 DIAGNOSIS — M79621 Pain in right upper arm: Secondary | ICD-10-CM

## 2016-03-09 DIAGNOSIS — M25511 Pain in right shoulder: Secondary | ICD-10-CM | POA: Diagnosis not present

## 2016-03-24 DIAGNOSIS — J301 Allergic rhinitis due to pollen: Secondary | ICD-10-CM | POA: Diagnosis not present

## 2016-03-24 DIAGNOSIS — J3089 Other allergic rhinitis: Secondary | ICD-10-CM | POA: Diagnosis not present

## 2016-03-27 DIAGNOSIS — M25611 Stiffness of right shoulder, not elsewhere classified: Secondary | ICD-10-CM | POA: Diagnosis not present

## 2016-03-27 DIAGNOSIS — M25511 Pain in right shoulder: Secondary | ICD-10-CM | POA: Diagnosis not present

## 2016-03-28 ENCOUNTER — Other Ambulatory Visit: Payer: Self-pay | Admitting: Gastroenterology

## 2016-03-28 ENCOUNTER — Other Ambulatory Visit: Payer: Self-pay | Admitting: Internal Medicine

## 2016-03-30 DIAGNOSIS — H01022 Squamous blepharitis right lower eyelid: Secondary | ICD-10-CM | POA: Diagnosis not present

## 2016-03-30 DIAGNOSIS — H01024 Squamous blepharitis left upper eyelid: Secondary | ICD-10-CM | POA: Diagnosis not present

## 2016-03-30 DIAGNOSIS — H10413 Chronic giant papillary conjunctivitis, bilateral: Secondary | ICD-10-CM | POA: Diagnosis not present

## 2016-03-30 DIAGNOSIS — H2513 Age-related nuclear cataract, bilateral: Secondary | ICD-10-CM | POA: Diagnosis not present

## 2016-03-30 DIAGNOSIS — H01021 Squamous blepharitis right upper eyelid: Secondary | ICD-10-CM | POA: Diagnosis not present

## 2016-03-30 DIAGNOSIS — H01025 Squamous blepharitis left lower eyelid: Secondary | ICD-10-CM | POA: Diagnosis not present

## 2016-03-30 DIAGNOSIS — H35373 Puckering of macula, bilateral: Secondary | ICD-10-CM | POA: Diagnosis not present

## 2016-03-31 ENCOUNTER — Telehealth: Payer: Self-pay | Admitting: Internal Medicine

## 2016-03-31 ENCOUNTER — Other Ambulatory Visit: Payer: Self-pay | Admitting: Geriatric Medicine

## 2016-03-31 MED ORDER — PANTOPRAZOLE SODIUM 40 MG PO TBEC: 40.0000 mg | DELAYED_RELEASE_TABLET | Freq: Two times a day (BID) | ORAL | 3 refills | Status: DC

## 2016-03-31 MED ORDER — SIMVASTATIN 40 MG PO TABS: 40.0000 mg | ORAL_TABLET | Freq: Every day | ORAL | 3 refills | Status: DC

## 2016-03-31 NOTE — Telephone Encounter (Signed)
Sent to pharmacy 

## 2016-03-31 NOTE — Telephone Encounter (Signed)
Patient is needing a refill on protonix and simvastatin sent to pharmacy on file

## 2016-04-01 ENCOUNTER — Ambulatory Visit: Payer: PPO | Admitting: Internal Medicine

## 2016-04-03 DIAGNOSIS — M25611 Stiffness of right shoulder, not elsewhere classified: Secondary | ICD-10-CM | POA: Diagnosis not present

## 2016-04-03 DIAGNOSIS — M25511 Pain in right shoulder: Secondary | ICD-10-CM | POA: Diagnosis not present

## 2016-04-08 DIAGNOSIS — M25611 Stiffness of right shoulder, not elsewhere classified: Secondary | ICD-10-CM | POA: Diagnosis not present

## 2016-04-08 DIAGNOSIS — M25511 Pain in right shoulder: Secondary | ICD-10-CM | POA: Diagnosis not present

## 2016-04-18 DIAGNOSIS — J019 Acute sinusitis, unspecified: Secondary | ICD-10-CM | POA: Diagnosis not present

## 2016-04-30 DIAGNOSIS — M25611 Stiffness of right shoulder, not elsewhere classified: Secondary | ICD-10-CM | POA: Diagnosis not present

## 2016-04-30 DIAGNOSIS — M25511 Pain in right shoulder: Secondary | ICD-10-CM | POA: Diagnosis not present

## 2016-05-13 DIAGNOSIS — M25511 Pain in right shoulder: Secondary | ICD-10-CM | POA: Diagnosis not present

## 2016-05-13 DIAGNOSIS — M25611 Stiffness of right shoulder, not elsewhere classified: Secondary | ICD-10-CM | POA: Diagnosis not present

## 2016-05-14 ENCOUNTER — Other Ambulatory Visit (INDEPENDENT_AMBULATORY_CARE_PROVIDER_SITE_OTHER): Payer: PPO

## 2016-05-14 ENCOUNTER — Encounter: Payer: Self-pay | Admitting: Internal Medicine

## 2016-05-14 ENCOUNTER — Ambulatory Visit (INDEPENDENT_AMBULATORY_CARE_PROVIDER_SITE_OTHER): Payer: PPO | Admitting: Internal Medicine

## 2016-05-14 VITALS — BP 120/60 | HR 88 | Temp 98.1°F | Resp 12 | Ht 63.0 in | Wt 136.0 lb

## 2016-05-14 DIAGNOSIS — Z0000 Encounter for general adult medical examination without abnormal findings: Secondary | ICD-10-CM

## 2016-05-14 DIAGNOSIS — Z1159 Encounter for screening for other viral diseases: Secondary | ICD-10-CM

## 2016-05-14 DIAGNOSIS — J301 Allergic rhinitis due to pollen: Secondary | ICD-10-CM | POA: Diagnosis not present

## 2016-05-14 DIAGNOSIS — F4010 Social phobia, unspecified: Secondary | ICD-10-CM | POA: Diagnosis not present

## 2016-05-14 DIAGNOSIS — E7800 Pure hypercholesterolemia, unspecified: Secondary | ICD-10-CM

## 2016-05-14 DIAGNOSIS — J3089 Other allergic rhinitis: Secondary | ICD-10-CM | POA: Diagnosis not present

## 2016-05-14 DIAGNOSIS — K22719 Barrett's esophagus with dysplasia, unspecified: Secondary | ICD-10-CM

## 2016-05-14 DIAGNOSIS — Z23 Encounter for immunization: Secondary | ICD-10-CM | POA: Diagnosis not present

## 2016-05-14 LAB — CBC
HCT: 38.3 % (ref 36.0–46.0)
Hemoglobin: 13.4 g/dL (ref 12.0–15.0)
MCHC: 35 g/dL (ref 30.0–36.0)
MCV: 90.8 fl (ref 78.0–100.0)
Platelets: 219 10*3/uL (ref 150.0–400.0)
RBC: 4.22 Mil/uL (ref 3.87–5.11)
RDW: 12.9 % (ref 11.5–15.5)
WBC: 3.9 10*3/uL — ABNORMAL LOW (ref 4.0–10.5)

## 2016-05-14 LAB — COMPREHENSIVE METABOLIC PANEL
ALT: 13 U/L (ref 0–35)
AST: 20 U/L (ref 0–37)
Albumin: 4.4 g/dL (ref 3.5–5.2)
Alkaline Phosphatase: 34 U/L — ABNORMAL LOW (ref 39–117)
BUN: 16 mg/dL (ref 6–23)
CO2: 28 mEq/L (ref 19–32)
Calcium: 9.4 mg/dL (ref 8.4–10.5)
Chloride: 107 mEq/L (ref 96–112)
Creatinine, Ser: 0.91 mg/dL (ref 0.40–1.20)
GFR: 65.71 mL/min (ref >60.00)
Glucose, Bld: 101 mg/dL — ABNORMAL HIGH (ref 70–99)
Potassium: 4.3 mEq/L (ref 3.5–5.1)
Sodium: 140 mEq/L (ref 135–145)
Total Bilirubin: 0.6 mg/dL (ref 0.2–1.2)
Total Protein: 7.5 g/dL (ref 6.0–8.3)

## 2016-05-14 LAB — HEPATITIS C ANTIBODY: HCV Ab: NEGATIVE

## 2016-05-14 LAB — TSH: TSH: 1.83 u[IU]/mL (ref 0.35–4.50)

## 2016-05-14 LAB — LIPID PANEL
Cholesterol: 173 mg/dL (ref 0–200)
HDL: 48.1 mg/dL (ref >39.00)
LDL Cholesterol: 105 mg/dL — ABNORMAL HIGH (ref 0–99)
NonHDL: 125.21
Total CHOL/HDL Ratio: 4
Triglycerides: 103 mg/dL (ref 0.0–149.0)
VLDL: 20.6 mg/dL (ref 0.0–40.0)

## 2016-05-14 LAB — HEMOGLOBIN A1C: Hgb A1c MFr Bld: 5.5 % (ref 4.6–6.5)

## 2016-05-14 MED ORDER — CITALOPRAM HYDROBROMIDE 20 MG PO TABS: ORAL_TABLET | ORAL | 3 refills | Status: DC

## 2016-05-14 MED ORDER — CLONAZEPAM 0.25 MG PO TBDP: ORAL_TABLET | ORAL | 0 refills | Status: DC

## 2016-05-14 NOTE — Progress Notes (Signed)
Pre visit review using our clinic review tool, if applicable. No additional management support is needed unless otherwise documented below in the visit note. 

## 2016-05-14 NOTE — Progress Notes (Signed)
   Subjective:    Patient ID: Sheri Andrews, female    DOB: [birth year], 66 y.o.   MRN: [id number]  HPI Here for medicare wellness and physical, no new complaints. Please see A/P for status and treatment of chronic medical problems.   Diet: heart healthy Physical activity: sedentary Depression/mood screen: negative Hearing: intact to whispered voice, mild loss Visual acuity: grossly normal, performs annual eye exam  ADLs: capable Fall risk: none Home safety: good Cognitive evaluation: intact to orientation, naming, recall and repetition EOL planning: adv directives discussed  I have personally reviewed and have noted 1. The patient's medical and social history - reviewed today no changes 2. Their use of alcohol, tobacco or illicit drugs 3. Their current medications and supplements 4. The patient's functional ability including ADL's, fall risks, home safety risks and hearing or visual impairment. 5. Diet and physical activities 6. Evidence for depression or mood disorders 7. Care team reviewed and updated (available in snapshot)  Review of Systems  Constitutional: Negative.   HENT: Negative.   Eyes: Negative.   Respiratory: Negative for cough, chest tightness and shortness of breath.   Cardiovascular: Negative for chest pain, palpitations and leg swelling.  Gastrointestinal: Negative for abdominal distention, abdominal pain, constipation, diarrhea, nausea and vomiting.  Musculoskeletal: Negative.   Skin: Negative.   Neurological: Negative.   Psychiatric/Behavioral: Negative.       Objective:   Physical Exam  Constitutional: She is oriented to person, place, and time. She appears well-developed and well-nourished.  HENT:  Head: Normocephalic and atraumatic.  Eyes: EOM are normal.  Neck: Normal range of motion.  Cardiovascular: Normal rate and regular rhythm.   Pulmonary/Chest: Effort normal and breath sounds normal. No respiratory distress. She has no wheezes. She has no  rales.  Abdominal: Soft. Bowel sounds are normal. She exhibits no distension. There is no tenderness. There is no rebound.  Musculoskeletal: She exhibits no edema.  Neurological: She is alert and oriented to person, place, and time. Coordination normal.  Skin: Skin is warm and dry.  Psychiatric: She has a normal mood and affect.   Vitals:   05/14/16 1010  BP: 120/60  Pulse: 88  Resp: 12  Temp: 98.1 F (36.7 C)  TempSrc: Oral  SpO2: 97%  Weight: 136 lb (61.7 kg)  Height: 5\' 3"  (1.6 m)      Assessment & Plan:  Pneumonia 23 given at visit.

## 2016-05-14 NOTE — Patient Instructions (Signed)
We are checking the labs today and will send the results on mychart.   We have given you the pneumonia shot today.   We can talk about the new shingles shot next time.  Get back into some exercise (even walking) to help stay healthy.   Health Maintenance, Female Introduction Adopting a healthy lifestyle and getting preventive care can go a long way to promote health and wellness. Talk with your health care provider about what schedule of regular examinations is right for you. This is a good chance for you to check in with your provider about disease prevention and staying healthy. In between checkups, there are plenty of things you can do on your own. Experts have done a lot of research about which lifestyle changes and preventive measures are most likely to keep you healthy. Ask your health care provider for more information. Weight and diet Eat a healthy diet  Be sure to include plenty of vegetables, fruits, low-fat dairy products, and lean protein.  Do not eat a lot of foods high in solid fats, added sugars, or salt.  Get regular exercise. This is one of the most important things you can do for your health.  Most adults should exercise for at least 150 minutes each week. The exercise should increase your heart rate and make you sweat (moderate-intensity exercise).  Most adults should also do strengthening exercises at least twice a week. This is in addition to the moderate-intensity exercise. Maintain a healthy weight  Body mass index (BMI) is a measurement that can be used to identify possible weight problems. It estimates body fat based on height and weight. Your health care provider can help determine your BMI and help you achieve or maintain a healthy weight.  For females 74 years of age and older:  A BMI below 18.5 is considered underweight.  A BMI of 18.5 to 24.9 is normal.  A BMI of 25 to 29.9 is considered overweight.  A BMI of 30 and above is considered obese. Watch  levels of cholesterol and blood lipids  You should start having your blood tested for lipids and cholesterol at 67 years of age, then have this test every 5 years.  You may need to have your cholesterol levels checked more often if:  Your lipid or cholesterol levels are high.  You are older than 67 years of age.  You are at high risk for heart disease. Cancer screening Lung Cancer  Lung cancer screening is recommended for adults 66-97 years old who are at high risk for lung cancer because of a history of smoking.  A yearly low-dose CT scan of the lungs is recommended for people who:  Currently smoke.  Have quit within the past 15 years.  Have at least a 30-pack-year history of smoking. A pack year is smoking an average of one pack of cigarettes a day for 1 year.  Yearly screening should continue until it has been 15 years since you quit.  Yearly screening should stop if you develop a health problem that would prevent you from having lung cancer treatment. Breast Cancer  Practice breast self-awareness. This means understanding how your breasts normally appear and feel.  It also means doing regular breast self-exams. Let your health care provider know about any changes, no matter how small.  If you are in your 20s or 30s, you should have a clinical breast exam (CBE) by a health care provider every 1-3 years as part of a regular health exam.  If you are 40 or older, have a CBE every year. Also consider having a breast X-ray (mammogram) every year.  If you have a family history of breast cancer, talk to your health care provider about genetic screening.  If you are at high risk for breast cancer, talk to your health care provider about having an MRI and a mammogram every year.  Breast cancer gene (BRCA) assessment is recommended for women who have family members with BRCA-related cancers. BRCA-related cancers include:  Breast.  Ovarian.  Tubal.  Peritoneal  cancers.  Results of the assessment will determine the need for genetic counseling and BRCA1 and BRCA2 testing. Cervical Cancer  Your health care provider may recommend that you be screened regularly for cancer of the pelvic organs (ovaries, uterus, and vagina). This screening involves a pelvic examination, including checking for microscopic changes to the surface of your cervix (Pap test). You may be encouraged to have this screening done every 3 years, beginning at age 45.  For women ages 18-65, health care providers may recommend pelvic exams and Pap testing every 3 years, or they may recommend the Pap and pelvic exam, combined with testing for human papilloma virus (HPV), every 5 years. Some types of HPV increase your risk of cervical cancer. Testing for HPV may also be done on women of any age with unclear Pap test results.  Other health care providers may not recommend any screening for nonpregnant women who are considered low risk for pelvic cancer and who do not have symptoms. Ask your health care provider if a screening pelvic exam is right for you.  If you have had past treatment for cervical cancer or a condition that could lead to cancer, you need Pap tests and screening for cancer for at least 20 years after your treatment. If Pap tests have been discontinued, your risk factors (such as having a new sexual partner) need to be reassessed to determine if screening should resume. Some women have medical problems that increase the chance of getting cervical cancer. In these cases, your health care provider may recommend more frequent screening and Pap tests. Colorectal Cancer  This type of cancer can be detected and often prevented.  Routine colorectal cancer screening usually begins at 67 years of age and continues through 67 years of age.  Your health care provider may recommend screening at an earlier age if you have risk factors for colon cancer.  Your health care provider may also  recommend using home test kits to check for hidden blood in the stool.  A small camera at the end of a tube can be used to examine your colon directly (sigmoidoscopy or colonoscopy). This is done to check for the earliest forms of colorectal cancer.  Routine screening usually begins at age 34.  Direct examination of the colon should be repeated every 5-10 years through 67 years of age. However, you may need to be screened more often if early forms of precancerous polyps or small growths are found. Skin Cancer  Check your skin from head to toe regularly.  Tell your health care provider about any new moles or changes in moles, especially if there is a change in a mole's shape or color.  Also tell your health care provider if you have a mole that is larger than the size of a pencil eraser.  Always use sunscreen. Apply sunscreen liberally and repeatedly throughout the day.  Protect yourself by wearing long sleeves, pants, a wide-brimmed hat, and sunglasses whenever  you are outside. Heart disease, diabetes, and high blood pressure  High blood pressure causes heart disease and increases the risk of stroke. High blood pressure is more likely to develop in:  People who have blood pressure in the high end of the normal range (130-139/85-89 mm Hg).  People who are overweight or obese.  People who are African American.  If you are 47-59 years of age, have your blood pressure checked every 3-5 years. If you are 84 years of age or older, have your blood pressure checked every year. You should have your blood pressure measured twice-once when you are at a hospital or clinic, and once when you are not at a hospital or clinic. Record the average of the two measurements. To check your blood pressure when you are not at a hospital or clinic, you can use:  An automated blood pressure machine at a pharmacy.  A home blood pressure monitor.  If you are between 47 years and 5 years old, ask your health  care provider if you should take aspirin to prevent strokes.  Have regular diabetes screenings. This involves taking a blood sample to check your fasting blood sugar level.  If you are at a normal weight and have a low risk for diabetes, have this test once every three years after 67 years of age.  If you are overweight and have a high risk for diabetes, consider being tested at a younger age or more often. Preventing infection Hepatitis B  If you have a higher risk for hepatitis B, you should be screened for this virus. You are considered at high risk for hepatitis B if:  You were born in a country where hepatitis B is common. Ask your health care provider which countries are considered high risk.  Your parents were born in a high-risk country, and you have not been immunized against hepatitis B (hepatitis B vaccine).  You have HIV or AIDS.  You use needles to inject street drugs.  You live with someone who has hepatitis B.  You have had sex with someone who has hepatitis B.  You get hemodialysis treatment.  You take certain medicines for conditions, including cancer, organ transplantation, and autoimmune conditions. Hepatitis C  Blood testing is recommended for:  Everyone born from [birth year] through 1966.  Anyone with known risk factors for hepatitis C. Sexually transmitted infections (STIs)  You should be screened for sexually transmitted infections (STIs) including gonorrhea and chlamydia if:  You are sexually active and are younger than 67 years of age.  You are older than 67 years of age and your health care provider tells you that you are at risk for this type of infection.  Your sexual activity has changed since you were last screened and you are at an increased risk for chlamydia or gonorrhea. Ask your health care provider if you are at risk.  If you do not have HIV, but are at risk, it may be recommended that you take a prescription medicine daily to prevent HIV  infection. This is called pre-exposure prophylaxis (PrEP). You are considered at risk if:  You are sexually active and do not regularly use condoms or know the HIV status of your partner(s).  You take drugs by injection.  You are sexually active with a partner who has HIV. Talk with your health care provider about whether you are at high risk of being infected with HIV. If you choose to begin PrEP, you should first be tested for HIV.  You should then be tested every 3 months for as long as you are taking PrEP. Pregnancy  If you are premenopausal and you may become pregnant, ask your health care provider about preconception counseling.  If you may become pregnant, take 400 to 800 micrograms (mcg) of folic acid every day.  If you want to prevent pregnancy, talk to your health care provider about birth control (contraception). Osteoporosis and menopause  Osteoporosis is a disease in which the bones lose minerals and strength with aging. This can result in serious bone fractures. Your risk for osteoporosis can be identified using a bone density scan.  If you are 32 years of age or older, or if you are at risk for osteoporosis and fractures, ask your health care provider if you should be screened.  Ask your health care provider whether you should take a calcium or vitamin D supplement to lower your risk for osteoporosis.  Menopause may have certain physical symptoms and risks.  Hormone replacement therapy may reduce some of these symptoms and risks. Talk to your health care provider about whether hormone replacement therapy is right for you. Follow these instructions at home:  Schedule regular health, dental, and eye exams.  Stay current with your immunizations.  Do not use any tobacco products including cigarettes, chewing tobacco, or electronic cigarettes.  If you are pregnant, do not drink alcohol.  If you are breastfeeding, limit how much and how often you drink alcohol.  Limit  alcohol intake to no more than 1 drink per day for nonpregnant women. One drink equals 12 ounces of beer, 5 ounces of wine, or 1 ounces of hard liquor.  Do not use street drugs.  Do not share needles.  Ask your health care provider for help if you need support or information about quitting drugs.  Tell your health care provider if you often feel depressed.  Tell your health care provider if you have ever been abused or do not feel safe at home. This information is not intended to replace advice given to you by your health care provider. Make sure you discuss any questions you have with your health care provider. Document Released: 10/20/2010 Document Revised: 09/12/2015 Document Reviewed: 01/08/2015  2017 Elsevier

## 2016-05-15 ENCOUNTER — Encounter: Payer: Self-pay | Admitting: Internal Medicine

## 2016-05-15 DIAGNOSIS — Z0000 Encounter for general adult medical examination without abnormal findings: Secondary | ICD-10-CM | POA: Insufficient documentation

## 2016-05-15 NOTE — Assessment & Plan Note (Signed)
Given pneumonia 23 to complete pneumonia series. Flu shot and tetanus shot up to date. Colonoscopy due in 2020, mammogram up to date. Declines shingles shot today. Counseled on sun safety and mole surveillance as well as the dangers of distracted driving. Given 10 year screening recommendations.

## 2016-05-15 NOTE — Assessment & Plan Note (Signed)
Refilled celexa and rare clonazepam usage. Discussed the risk of dependence on clonazepam and she understands and uses as needed only.

## 2016-05-15 NOTE — Assessment & Plan Note (Signed)
Checking lipid panel, taking simvastatin 40 mg daily. Adjust as needed.

## 2016-05-15 NOTE — Assessment & Plan Note (Signed)
Taking protonix daily for symptoms.

## 2016-05-18 ENCOUNTER — Telehealth: Payer: Self-pay | Admitting: *Deleted

## 2016-05-18 DIAGNOSIS — J301 Allergic rhinitis due to pollen: Secondary | ICD-10-CM | POA: Diagnosis not present

## 2016-05-18 DIAGNOSIS — J3089 Other allergic rhinitis: Secondary | ICD-10-CM | POA: Diagnosis not present

## 2016-05-18 MED ORDER — ESCITALOPRAM OXALATE 10 MG PO TABS: ORAL_TABLET | ORAL | 1 refills | Status: DC

## 2016-05-18 NOTE — Telephone Encounter (Signed)
Why did she not let us know at the visit when she reviewed her medications and asked for refills? Sent in and removed citalopram.

## 2016-05-18 NOTE — Telephone Encounter (Signed)
Pt left msg on triage stating that she no longer takes the citalopram she takes lexapro. Saw MD for cpx and rx for citalopram was sent to her pharmacy. Requesting rx for lexapro to be sent...Johny Chess

## 2016-05-18 NOTE — Telephone Encounter (Signed)
Notified pt w/MD response.../lmb 

## 2016-06-10 DIAGNOSIS — Z1151 Encounter for screening for human papillomavirus (HPV): Secondary | ICD-10-CM | POA: Diagnosis not present

## 2016-06-10 DIAGNOSIS — Z01419 Encounter for gynecological examination (general) (routine) without abnormal findings: Secondary | ICD-10-CM | POA: Diagnosis not present

## 2016-06-10 DIAGNOSIS — Z79890 Hormone replacement therapy (postmenopausal): Secondary | ICD-10-CM | POA: Diagnosis not present

## 2016-06-10 DIAGNOSIS — J301 Allergic rhinitis due to pollen: Secondary | ICD-10-CM | POA: Diagnosis not present

## 2016-06-10 DIAGNOSIS — J3089 Other allergic rhinitis: Secondary | ICD-10-CM | POA: Diagnosis not present

## 2016-06-10 DIAGNOSIS — Z124 Encounter for screening for malignant neoplasm of cervix: Secondary | ICD-10-CM | POA: Diagnosis not present

## 2016-06-12 DIAGNOSIS — Z1231 Encounter for screening mammogram for malignant neoplasm of breast: Secondary | ICD-10-CM | POA: Diagnosis not present

## 2016-06-15 DIAGNOSIS — J3089 Other allergic rhinitis: Secondary | ICD-10-CM | POA: Diagnosis not present

## 2016-06-15 DIAGNOSIS — J301 Allergic rhinitis due to pollen: Secondary | ICD-10-CM | POA: Diagnosis not present

## 2016-07-10 DIAGNOSIS — J3089 Other allergic rhinitis: Secondary | ICD-10-CM | POA: Diagnosis not present

## 2016-07-10 DIAGNOSIS — J301 Allergic rhinitis due to pollen: Secondary | ICD-10-CM | POA: Diagnosis not present

## 2016-07-21 DIAGNOSIS — J019 Acute sinusitis, unspecified: Secondary | ICD-10-CM | POA: Diagnosis not present

## 2016-08-14 DIAGNOSIS — J3089 Other allergic rhinitis: Secondary | ICD-10-CM | POA: Diagnosis not present

## 2016-08-14 DIAGNOSIS — J301 Allergic rhinitis due to pollen: Secondary | ICD-10-CM | POA: Diagnosis not present

## 2016-09-18 DIAGNOSIS — J301 Allergic rhinitis due to pollen: Secondary | ICD-10-CM | POA: Diagnosis not present

## 2016-09-18 DIAGNOSIS — J3089 Other allergic rhinitis: Secondary | ICD-10-CM | POA: Diagnosis not present

## 2016-09-23 DIAGNOSIS — J3089 Other allergic rhinitis: Secondary | ICD-10-CM | POA: Diagnosis not present

## 2016-09-23 DIAGNOSIS — J301 Allergic rhinitis due to pollen: Secondary | ICD-10-CM | POA: Diagnosis not present

## 2016-09-24 DIAGNOSIS — H2511 Age-related nuclear cataract, right eye: Secondary | ICD-10-CM | POA: Diagnosis not present

## 2016-09-24 DIAGNOSIS — H40032 Anatomical narrow angle, left eye: Secondary | ICD-10-CM | POA: Diagnosis not present

## 2016-09-24 DIAGNOSIS — H2512 Age-related nuclear cataract, left eye: Secondary | ICD-10-CM | POA: Diagnosis not present

## 2016-09-24 DIAGNOSIS — H35371 Puckering of macula, right eye: Secondary | ICD-10-CM | POA: Diagnosis not present

## 2016-11-14 ENCOUNTER — Other Ambulatory Visit: Payer: Self-pay | Admitting: Internal Medicine

## 2016-12-30 DIAGNOSIS — J301 Allergic rhinitis due to pollen: Secondary | ICD-10-CM | POA: Diagnosis not present

## 2016-12-30 DIAGNOSIS — J3089 Other allergic rhinitis: Secondary | ICD-10-CM | POA: Diagnosis not present

## 2017-01-01 DIAGNOSIS — J301 Allergic rhinitis due to pollen: Secondary | ICD-10-CM | POA: Diagnosis not present

## 2017-01-01 DIAGNOSIS — J3089 Other allergic rhinitis: Secondary | ICD-10-CM | POA: Diagnosis not present

## 2017-01-05 DIAGNOSIS — J3089 Other allergic rhinitis: Secondary | ICD-10-CM | POA: Diagnosis not present

## 2017-01-05 DIAGNOSIS — J301 Allergic rhinitis due to pollen: Secondary | ICD-10-CM | POA: Diagnosis not present

## 2017-01-11 DIAGNOSIS — M25511 Pain in right shoulder: Secondary | ICD-10-CM | POA: Diagnosis not present

## 2017-01-11 DIAGNOSIS — M542 Cervicalgia: Secondary | ICD-10-CM | POA: Diagnosis not present

## 2017-01-11 DIAGNOSIS — J3089 Other allergic rhinitis: Secondary | ICD-10-CM | POA: Diagnosis not present

## 2017-01-11 DIAGNOSIS — J301 Allergic rhinitis due to pollen: Secondary | ICD-10-CM | POA: Diagnosis not present

## 2017-01-14 DIAGNOSIS — J301 Allergic rhinitis due to pollen: Secondary | ICD-10-CM | POA: Diagnosis not present

## 2017-01-14 DIAGNOSIS — J3089 Other allergic rhinitis: Secondary | ICD-10-CM | POA: Diagnosis not present

## 2017-01-21 DIAGNOSIS — J301 Allergic rhinitis due to pollen: Secondary | ICD-10-CM | POA: Diagnosis not present

## 2017-01-21 DIAGNOSIS — J3089 Other allergic rhinitis: Secondary | ICD-10-CM | POA: Diagnosis not present

## 2017-01-25 DIAGNOSIS — J3089 Other allergic rhinitis: Secondary | ICD-10-CM | POA: Diagnosis not present

## 2017-01-25 DIAGNOSIS — J301 Allergic rhinitis due to pollen: Secondary | ICD-10-CM | POA: Diagnosis not present

## 2017-01-25 DIAGNOSIS — Z23 Encounter for immunization: Secondary | ICD-10-CM | POA: Diagnosis not present

## 2017-02-02 ENCOUNTER — Other Ambulatory Visit: Payer: Self-pay | Admitting: Family

## 2017-02-02 DIAGNOSIS — J3089 Other allergic rhinitis: Secondary | ICD-10-CM | POA: Diagnosis not present

## 2017-02-02 DIAGNOSIS — J301 Allergic rhinitis due to pollen: Secondary | ICD-10-CM | POA: Diagnosis not present

## 2017-02-10 ENCOUNTER — Other Ambulatory Visit: Payer: Self-pay | Admitting: Internal Medicine

## 2017-02-10 DIAGNOSIS — J301 Allergic rhinitis due to pollen: Secondary | ICD-10-CM | POA: Diagnosis not present

## 2017-02-10 DIAGNOSIS — J3089 Other allergic rhinitis: Secondary | ICD-10-CM | POA: Diagnosis not present

## 2017-02-23 DIAGNOSIS — J301 Allergic rhinitis due to pollen: Secondary | ICD-10-CM | POA: Diagnosis not present

## 2017-02-23 DIAGNOSIS — J3089 Other allergic rhinitis: Secondary | ICD-10-CM | POA: Diagnosis not present

## 2017-03-02 DIAGNOSIS — J301 Allergic rhinitis due to pollen: Secondary | ICD-10-CM | POA: Diagnosis not present

## 2017-03-02 DIAGNOSIS — J3089 Other allergic rhinitis: Secondary | ICD-10-CM | POA: Diagnosis not present

## 2017-03-05 DIAGNOSIS — J3089 Other allergic rhinitis: Secondary | ICD-10-CM | POA: Diagnosis not present

## 2017-03-05 DIAGNOSIS — J301 Allergic rhinitis due to pollen: Secondary | ICD-10-CM | POA: Diagnosis not present

## 2017-03-08 DIAGNOSIS — J3089 Other allergic rhinitis: Secondary | ICD-10-CM | POA: Diagnosis not present

## 2017-03-08 DIAGNOSIS — J301 Allergic rhinitis due to pollen: Secondary | ICD-10-CM | POA: Diagnosis not present

## 2017-03-23 DIAGNOSIS — J3089 Other allergic rhinitis: Secondary | ICD-10-CM | POA: Diagnosis not present

## 2017-03-23 DIAGNOSIS — J301 Allergic rhinitis due to pollen: Secondary | ICD-10-CM | POA: Diagnosis not present

## 2017-03-24 DIAGNOSIS — Z6823 Body mass index (BMI) 23.0-23.9, adult: Secondary | ICD-10-CM | POA: Diagnosis not present

## 2017-03-24 DIAGNOSIS — R768 Other specified abnormal immunological findings in serum: Secondary | ICD-10-CM | POA: Diagnosis not present

## 2017-03-24 DIAGNOSIS — M150 Primary generalized (osteo)arthritis: Secondary | ICD-10-CM | POA: Diagnosis not present

## 2017-03-24 DIAGNOSIS — M3500 Sicca syndrome, unspecified: Secondary | ICD-10-CM | POA: Diagnosis not present

## 2017-03-26 DIAGNOSIS — J3089 Other allergic rhinitis: Secondary | ICD-10-CM | POA: Diagnosis not present

## 2017-03-26 DIAGNOSIS — J301 Allergic rhinitis due to pollen: Secondary | ICD-10-CM | POA: Diagnosis not present

## 2017-03-31 DIAGNOSIS — J3089 Other allergic rhinitis: Secondary | ICD-10-CM | POA: Diagnosis not present

## 2017-03-31 DIAGNOSIS — J301 Allergic rhinitis due to pollen: Secondary | ICD-10-CM | POA: Diagnosis not present

## 2017-04-25 ENCOUNTER — Other Ambulatory Visit: Payer: Self-pay | Admitting: Internal Medicine

## 2017-04-26 DIAGNOSIS — H10413 Chronic giant papillary conjunctivitis, bilateral: Secondary | ICD-10-CM | POA: Diagnosis not present

## 2017-04-26 DIAGNOSIS — H04123 Dry eye syndrome of bilateral lacrimal glands: Secondary | ICD-10-CM | POA: Diagnosis not present

## 2017-04-26 DIAGNOSIS — H2513 Age-related nuclear cataract, bilateral: Secondary | ICD-10-CM | POA: Diagnosis not present

## 2017-04-26 DIAGNOSIS — H35373 Puckering of macula, bilateral: Secondary | ICD-10-CM | POA: Diagnosis not present

## 2017-04-29 DIAGNOSIS — J3089 Other allergic rhinitis: Secondary | ICD-10-CM | POA: Diagnosis not present

## 2017-04-29 DIAGNOSIS — J301 Allergic rhinitis due to pollen: Secondary | ICD-10-CM | POA: Diagnosis not present

## 2017-05-10 DIAGNOSIS — J301 Allergic rhinitis due to pollen: Secondary | ICD-10-CM | POA: Diagnosis not present

## 2017-05-10 DIAGNOSIS — J3089 Other allergic rhinitis: Secondary | ICD-10-CM | POA: Diagnosis not present

## 2017-05-11 DIAGNOSIS — H35371 Puckering of macula, right eye: Secondary | ICD-10-CM | POA: Diagnosis not present

## 2017-05-11 DIAGNOSIS — H40032 Anatomical narrow angle, left eye: Secondary | ICD-10-CM | POA: Diagnosis not present

## 2017-05-11 DIAGNOSIS — H40031 Anatomical narrow angle, right eye: Secondary | ICD-10-CM | POA: Diagnosis not present

## 2017-05-11 DIAGNOSIS — H35372 Puckering of macula, left eye: Secondary | ICD-10-CM | POA: Diagnosis not present

## 2017-05-12 ENCOUNTER — Other Ambulatory Visit: Payer: Self-pay | Admitting: Internal Medicine

## 2017-05-17 DIAGNOSIS — J3089 Other allergic rhinitis: Secondary | ICD-10-CM | POA: Diagnosis not present

## 2017-05-17 DIAGNOSIS — J301 Allergic rhinitis due to pollen: Secondary | ICD-10-CM | POA: Diagnosis not present

## 2017-06-23 DIAGNOSIS — Z6823 Body mass index (BMI) 23.0-23.9, adult: Secondary | ICD-10-CM | POA: Diagnosis not present

## 2017-06-23 DIAGNOSIS — Z1389 Encounter for screening for other disorder: Secondary | ICD-10-CM | POA: Diagnosis not present

## 2017-06-23 DIAGNOSIS — Z79890 Hormone replacement therapy (postmenopausal): Secondary | ICD-10-CM | POA: Diagnosis not present

## 2017-06-23 DIAGNOSIS — Z124 Encounter for screening for malignant neoplasm of cervix: Secondary | ICD-10-CM | POA: Diagnosis not present

## 2017-06-23 DIAGNOSIS — Z130 Encounter for screening for diseases of the blood and blood-forming organs and certain disorders involving the immune mechanism: Secondary | ICD-10-CM | POA: Diagnosis not present

## 2017-06-23 DIAGNOSIS — Z01419 Encounter for gynecological examination (general) (routine) without abnormal findings: Secondary | ICD-10-CM | POA: Diagnosis not present

## 2017-06-23 DIAGNOSIS — R8290 Unspecified abnormal findings in urine: Secondary | ICD-10-CM | POA: Diagnosis not present

## 2017-06-24 DIAGNOSIS — M8589 Other specified disorders of bone density and structure, multiple sites: Secondary | ICD-10-CM | POA: Diagnosis not present

## 2017-06-24 DIAGNOSIS — Z1231 Encounter for screening mammogram for malignant neoplasm of breast: Secondary | ICD-10-CM | POA: Diagnosis not present

## 2017-06-24 LAB — HM MAMMOGRAPHY

## 2017-06-24 LAB — HM DEXA SCAN: HM Dexa Scan: -1.8

## 2017-06-28 ENCOUNTER — Ambulatory Visit (INDEPENDENT_AMBULATORY_CARE_PROVIDER_SITE_OTHER): Payer: PPO | Admitting: Internal Medicine

## 2017-06-28 ENCOUNTER — Encounter: Payer: Self-pay | Admitting: Internal Medicine

## 2017-06-28 DIAGNOSIS — I10 Essential (primary) hypertension: Secondary | ICD-10-CM

## 2017-06-28 DIAGNOSIS — F429 Obsessive-compulsive disorder, unspecified: Secondary | ICD-10-CM

## 2017-06-28 DIAGNOSIS — Z8249 Family history of ischemic heart disease and other diseases of the circulatory system: Secondary | ICD-10-CM | POA: Diagnosis not present

## 2017-06-28 MED ORDER — CITALOPRAM HYDROBROMIDE 20 MG PO TABS: 20.0000 mg | ORAL_TABLET | Freq: Every day | ORAL | 3 refills | Status: DC

## 2017-06-28 MED ORDER — AMLODIPINE BESYLATE 10 MG PO TABS: 10.0000 mg | ORAL_TABLET | Freq: Every day | ORAL | 3 refills | Status: DC

## 2017-06-28 NOTE — Progress Notes (Signed)
   Subjective:    Patient ID: Sheri Andrews, female    DOB: October 29, 1950, 67 y.o.   MRN: [id number]  HPI The patient is a 67 YO female coming in for high blood pressure readings. She has had some high levels at the ob and then during a sinus infection. She has a very strong history of high blood pressure and all siblings are on blood pressure medication. There is also a very strong history of heart disease and a lot of siblings younger than her have had heart episodes. She denies chest pains or palpitations or pressure.  She is also struggling with OCD. She is taking lexapro for depression but no longer feels depressed. She is having OCD problems and has struggled with this more in the last several years. This is causing her to have problems completing tasks. She has problems focusing. She does waste a significant portion of her day with the OCD, she has never sought care for this or treatment. The lexapro does not help.   Review of Systems  Constitutional: Negative.   HENT: Negative.   Eyes: Negative.   Respiratory: Negative for cough, chest tightness and shortness of breath.   Cardiovascular: Negative for chest pain, palpitations and leg swelling.  Gastrointestinal: Negative for abdominal distention, abdominal pain, constipation, diarrhea, nausea and vomiting.  Musculoskeletal: Negative.   Skin: Negative.   Neurological: Negative.   Psychiatric/Behavioral: Positive for decreased concentration and dysphoric mood. Negative for self-injury, sleep disturbance and suicidal ideas. The patient is not nervous/anxious.       Objective:   Physical Exam  Constitutional: She is oriented to person, place, and time. She appears well-developed and well-nourished.  HENT:  Head: Normocephalic and atraumatic.  Eyes: EOM are normal.  Neck: Normal range of motion.  Cardiovascular: Normal rate and regular rhythm.  Pulmonary/Chest: Effort normal and breath sounds normal. No respiratory distress. She has no  wheezes. She has no rales.  Abdominal: Soft. Bowel sounds are normal. She exhibits no distension. There is no tenderness. There is no rebound.  Musculoskeletal: She exhibits no edema.  Neurological: She is alert and oriented to person, place, and time. Coordination normal.  Skin: Skin is warm and dry.  Psychiatric: She has a normal mood and affect.   Vitals:   06/28/17 1043  BP: 130/90  Pulse: 97  Temp: 98 F (36.7 C)  TempSrc: Oral  SpO2: 97%  Weight: 135 lb (61.2 kg)  Height: 5\' 3"  (1.6 m)      Assessment & Plan:

## 2017-06-28 NOTE — Patient Instructions (Signed)
We will send in amlodipine to take 1 pill daily for blood pressure.   For the lexapro (escitalopram) take 1/2 pill daily for 5 days then stop.   We have sent in celexa to help with the OCD which should help better. You can take 1 pill daily and then we will check in about 1-2 months to see if this is working.   You can also think about doing some therapy for the ocd as some counselors specialize in this and can help you come up with coping skills.

## 2017-06-29 DIAGNOSIS — I10 Essential (primary) hypertension: Secondary | ICD-10-CM | POA: Insufficient documentation

## 2017-06-29 NOTE — Assessment & Plan Note (Addendum)
Will wean lexapro and start celexa 20 mg daily. See back in 1-2 months and adjust as needed. Advised that she should seek counseling for coping skills with the OCD that may be beneficial as well. Mixed OCD and hoarding.

## 2017-06-29 NOTE — Assessment & Plan Note (Signed)
Weight is up some and she has not been exercising. She does want to work on diet and exercise but is willing to add amlodipine 10 mg daily today given family history of CAD which is very strong.

## 2017-06-29 NOTE — Assessment & Plan Note (Signed)
Repeat stress testing now as it has been 3 years and new hypertension.

## 2017-07-02 ENCOUNTER — Telehealth: Payer: Self-pay | Admitting: Internal Medicine

## 2017-07-02 DIAGNOSIS — J3089 Other allergic rhinitis: Secondary | ICD-10-CM | POA: Diagnosis not present

## 2017-07-02 DIAGNOSIS — J301 Allergic rhinitis due to pollen: Secondary | ICD-10-CM | POA: Diagnosis not present

## 2017-07-02 DIAGNOSIS — H1045 Other chronic allergic conjunctivitis: Secondary | ICD-10-CM | POA: Diagnosis not present

## 2017-07-02 NOTE — Telephone Encounter (Signed)
MD has left for today. Will hold until she return on Monday for response.Marland KitchenJohny Chess

## 2017-07-02 NOTE — Telephone Encounter (Signed)
Copied from [place] [id number]. Topic: Quick Communication - See Telephone Encounter >> Jul 02, 2017 11:28 AM Burnis Medin, NT wrote: CRM for notification. See Telephone encounter for: Patient called and said she was prescribed citalopram (CELEXA) 20 MG tablet. Patient said she was on this medication before and it made her hair fall out. Patient wanted to know if something else  be called in. Pt uses [facility], [address] [phone] (Phone) [phone] (Fax)    07/02/17.

## 2017-07-05 MED ORDER — FLUOXETINE HCL 20 MG PO TABS: 20.0000 mg | ORAL_TABLET | Freq: Every day | ORAL | 3 refills | Status: DC

## 2017-07-05 NOTE — Telephone Encounter (Signed)
We would recommend a psychiatrist to treat her ocd as this is one of the best ocd medications and most others in the same class can have the same risk of side effects (I.e. paxil or prozac).

## 2017-07-05 NOTE — Addendum Note (Signed)
Addended by: Pricilla Holm A on: 07/05/2017 09:40 AM   Modules accepted: Orders

## 2017-07-05 NOTE — Telephone Encounter (Signed)
Notified pt w/MD response.../lmb 

## 2017-07-05 NOTE — Telephone Encounter (Signed)
Notified pt w/MD response. Pt states she does not have an psychiatrist so will MD be willing to rx something.Marland KitchenJohny Andrews

## 2017-07-05 NOTE — Telephone Encounter (Signed)
Have sent in prozac to take daily for 1 month then should call back or return to evaluate progress.

## 2017-07-13 DIAGNOSIS — J301 Allergic rhinitis due to pollen: Secondary | ICD-10-CM | POA: Diagnosis not present

## 2017-07-13 DIAGNOSIS — J3089 Other allergic rhinitis: Secondary | ICD-10-CM | POA: Diagnosis not present

## 2017-07-14 ENCOUNTER — Ambulatory Visit (INDEPENDENT_AMBULATORY_CARE_PROVIDER_SITE_OTHER): Payer: PPO

## 2017-07-14 DIAGNOSIS — Z8249 Family history of ischemic heart disease and other diseases of the circulatory system: Secondary | ICD-10-CM | POA: Diagnosis not present

## 2017-07-14 LAB — EXERCISE TOLERANCE TEST
Estimated workload: 7 METS
Exercise duration (min): 6 min
Exercise duration (sec): 0 s
MPHR: 153 {beats}/min
Peak HR: 157 {beats}/min
Percent HR: 102 %
RPE: 13
Rest HR: 88 {beats}/min

## 2017-08-09 ENCOUNTER — Other Ambulatory Visit: Payer: Self-pay | Admitting: Internal Medicine

## 2017-08-12 ENCOUNTER — Ambulatory Visit (INDEPENDENT_AMBULATORY_CARE_PROVIDER_SITE_OTHER): Payer: PPO | Admitting: Internal Medicine

## 2017-08-12 ENCOUNTER — Other Ambulatory Visit (INDEPENDENT_AMBULATORY_CARE_PROVIDER_SITE_OTHER): Payer: PPO

## 2017-08-12 ENCOUNTER — Encounter: Payer: Self-pay | Admitting: Internal Medicine

## 2017-08-12 VITALS — BP 110/80 | HR 92 | Temp 98.1°F | Ht 63.0 in | Wt 136.0 lb

## 2017-08-12 DIAGNOSIS — Z0000 Encounter for general adult medical examination without abnormal findings: Secondary | ICD-10-CM | POA: Diagnosis not present

## 2017-08-12 DIAGNOSIS — K22719 Barrett's esophagus with dysplasia, unspecified: Secondary | ICD-10-CM | POA: Diagnosis not present

## 2017-08-12 DIAGNOSIS — Z8249 Family history of ischemic heart disease and other diseases of the circulatory system: Secondary | ICD-10-CM | POA: Diagnosis not present

## 2017-08-12 DIAGNOSIS — E7800 Pure hypercholesterolemia, unspecified: Secondary | ICD-10-CM

## 2017-08-12 DIAGNOSIS — I10 Essential (primary) hypertension: Secondary | ICD-10-CM

## 2017-08-12 DIAGNOSIS — F429 Obsessive-compulsive disorder, unspecified: Secondary | ICD-10-CM

## 2017-08-12 LAB — COMPREHENSIVE METABOLIC PANEL
ALT: 12 U/L (ref 0–35)
AST: 19 U/L (ref 0–37)
Albumin: 4.2 g/dL (ref 3.5–5.2)
Alkaline Phosphatase: 39 U/L (ref 39–117)
BUN: 15 mg/dL (ref 6–23)
CO2: 28 mEq/L (ref 19–32)
Calcium: 9.1 mg/dL (ref 8.4–10.5)
Chloride: 104 mEq/L (ref 96–112)
Creatinine, Ser: 0.88 mg/dL (ref 0.40–1.20)
GFR: 68.05 mL/min (ref >60.00)
Glucose, Bld: 93 mg/dL (ref 70–99)
Potassium: 4.1 mEq/L (ref 3.5–5.1)
Sodium: 140 mEq/L (ref 135–145)
Total Bilirubin: 0.3 mg/dL (ref 0.2–1.2)
Total Protein: 7.3 g/dL (ref 6.0–8.3)

## 2017-08-12 LAB — LIPID PANEL
Cholesterol: 148 mg/dL (ref 0–200)
HDL: 45.4 mg/dL (ref >39.00)
LDL Cholesterol: 79 mg/dL (ref 0–99)
NonHDL: 102.92
Total CHOL/HDL Ratio: 3
Triglycerides: 120 mg/dL (ref 0.0–149.0)
VLDL: 24 mg/dL (ref 0.0–40.0)

## 2017-08-12 LAB — CBC
HCT: 38.5 % (ref 36.0–46.0)
Hemoglobin: 13.1 g/dL (ref 12.0–15.0)
MCHC: 33.9 g/dL (ref 30.0–36.0)
MCV: 90.7 fl (ref 78.0–100.0)
Platelets: 223 10*3/uL (ref 150.0–400.0)
RBC: 4.24 Mil/uL (ref 3.87–5.11)
RDW: 13.5 % (ref 11.5–15.5)
WBC: 4 10*3/uL (ref 4.0–10.5)

## 2017-08-12 LAB — TSH: TSH: 1.96 u[IU]/mL (ref 0.35–4.50)

## 2017-08-12 NOTE — Assessment & Plan Note (Signed)
Last endoscopy 2016 Dec and recommended when getting colonoscopy feb 2020 to get endoscopy as well. Still on PPI daily.

## 2017-08-12 NOTE — Progress Notes (Signed)
   Subjective:    Patient ID: Sheri Andrews, female    DOB: 05-Apr-1951, 67 y.o.   MRN: [id number]  HPI Here for medicare wellness and physical, no new complaints. Please see A/P for status and treatment of chronic medical problems.   Diet: heart healthy Physical activity: sedentary Depression/mood screen: negative, currently OCD active Hearing: intact to whispered voice Visual acuity: grossly normal, performs annual eye exam  ADLs: capable Fall risk: none Home safety: good Cognitive evaluation: intact to orientation, naming, recall and repetition EOL planning: adv directives discussed  I have personally reviewed and have noted 1. The patient's medical and social history - reviewed today no changes 2. Their use of alcohol, tobacco or illicit drugs 3. Their current medications and supplements 4. The patient's functional ability including ADL's, fall risks, home safety risks and hearing or visual impairment. 5. Diet and physical activities 6. Evidence for depression or mood disorders 7. Care team reviewed and updated (available in snapshot)  Review of Systems  Constitutional: Negative.   HENT: Negative.   Eyes: Negative.   Respiratory: Negative for cough, chest tightness and shortness of breath.   Cardiovascular: Negative for chest pain, palpitations and leg swelling.  Gastrointestinal: Negative for abdominal distention, abdominal pain, constipation, diarrhea, nausea and vomiting.  Musculoskeletal: Negative.   Skin: Negative.   Neurological: Negative.   Psychiatric/Behavioral: Negative.        Ocd      Objective:   Physical Exam  Constitutional: She is oriented to person, place, and time. She appears well-developed and well-nourished.  HENT:  Head: Normocephalic and atraumatic.  Eyes: EOM are normal.  Neck: Normal range of motion.  Cardiovascular: Normal rate and regular rhythm.  Carotids normal  Pulmonary/Chest: Effort normal and breath sounds normal. No respiratory  distress. She has no wheezes. She has no rales.  Abdominal: Soft. Bowel sounds are normal. She exhibits no distension. There is no tenderness. There is no rebound.  Musculoskeletal: She exhibits no edema.  Neurological: She is alert and oriented to person, place, and time. Coordination normal.  Skin: Skin is warm and dry.  Psychiatric: She has a normal mood and affect.   Vitals:   08/12/17 1038  BP: 110/80  Pulse: 92  Temp: 98.1 F (36.7 C)  TempSrc: Oral  SpO2: 97%  Weight: 136 lb (61.7 kg)  Height: 5\' 3"  (1.6 m)      Assessment & Plan:

## 2017-08-12 NOTE — Assessment & Plan Note (Signed)
Taking simvastatin 40 mg daily and checking lipid panel and adjust as needed.

## 2017-08-12 NOTE — Assessment & Plan Note (Signed)
Increase prozac to 30 mg daily. If no improvement can increase to 40 mg daily.

## 2017-08-12 NOTE — Assessment & Plan Note (Signed)
BP at goal and recent stress test negative.

## 2017-08-12 NOTE — Assessment & Plan Note (Signed)
Flu shot yearly. Pneumonia done. Shingrix 1st done, second in 1-3 months. Colonoscopy due feb 2020. Mammogram up to date getting records and dexa up to date getting records. Counseled about sun safety and mole surveillance. Counseled about the dangers of distracted driving. Given 10 year screening recommendations.

## 2017-08-12 NOTE — Assessment & Plan Note (Signed)
BP at goal on amlodipine 10 mg daily. Checking CMP and adjust as needed.

## 2017-08-12 NOTE — Patient Instructions (Signed)
You can increase the prozac to 1.5 pills per day for 3-4 weeks. If you notice good benefit just let us know and we will refill for that.   If you do not notice much benefit we will increase to 2 pills per day and can refill for a 40 mg pill.   Health Maintenance, Female Adopting a healthy lifestyle and getting preventive care can go a long way to promote health and wellness. Talk with your health care provider about what schedule of regular examinations is right for you. This is a good chance for you to check in with your provider about disease prevention and staying healthy. In between checkups, there are plenty of things you can do on your own. Experts have done a lot of research about which lifestyle changes and preventive measures are most likely to keep you healthy. Ask your health care provider for more information. Weight and diet Eat a healthy diet  Be sure to include plenty of vegetables, fruits, low-fat dairy products, and lean protein.  Do not eat a lot of foods high in solid fats, added sugars, or salt.  Get regular exercise. This is one of the most important things you can do for your health. ? Most adults should exercise for at least 150 minutes each week. The exercise should increase your heart rate and make you sweat (moderate-intensity exercise). ? Most adults should also do strengthening exercises at least twice a week. This is in addition to the moderate-intensity exercise.  Maintain a healthy weight  Body mass index (BMI) is a measurement that can be used to identify possible weight problems. It estimates body fat based on height and weight. Your health care provider can help determine your BMI and help you achieve or maintain a healthy weight.  For females 94 years of age and older: ? A BMI below 18.5 is considered underweight. ? A BMI of 18.5 to 24.9 is normal. ? A BMI of 25 to 29.9 is considered overweight. ? A BMI of 30 and above is considered obese.  Watch levels  of cholesterol and blood lipids  You should start having your blood tested for lipids and cholesterol at 68 years of age, then have this test every 5 years.  You may need to have your cholesterol levels checked more often if: ? Your lipid or cholesterol levels are high. ? You are older than 68 years of age. ? You are at high risk for heart disease.  Cancer screening Lung Cancer  Lung cancer screening is recommended for adults 71-18 years old who are at high risk for lung cancer because of a history of smoking.  A yearly low-dose CT scan of the lungs is recommended for people who: ? Currently smoke. ? Have quit within the past 15 years. ? Have at least a 30-pack-year history of smoking. A pack year is smoking an average of one pack of cigarettes a day for 1 year.  Yearly screening should continue until it has been 15 years since you quit.  Yearly screening should stop if you develop a health problem that would prevent you from having lung cancer treatment.  Breast Cancer  Practice breast self-awareness. This means understanding how your breasts normally appear and feel.  It also means doing regular breast self-exams. Let your health care provider know about any changes, no matter how small.  If you are in your 20s or 30s, you should have a clinical breast exam (CBE) by a health care provider every 1-3  years as part of a regular health exam.  If you are 36 or older, have a CBE every year. Also consider having a breast X-ray (mammogram) every year.  If you have a family history of breast cancer, talk to your health care provider about genetic screening.  If you are at high risk for breast cancer, talk to your health care provider about having an MRI and a mammogram every year.  Breast cancer gene (BRCA) assessment is recommended for women who have family members with BRCA-related cancers. BRCA-related cancers include: ? Breast. ? Ovarian. ? Tubal. ? Peritoneal  cancers.  Results of the assessment will determine the need for genetic counseling and BRCA1 and BRCA2 testing.  Cervical Cancer Your health care provider may recommend that you be screened regularly for cancer of the pelvic organs (ovaries, uterus, and vagina). This screening involves a pelvic examination, including checking for microscopic changes to the surface of your cervix (Pap test). You may be encouraged to have this screening done every 3 years, beginning at age 63.  For women ages 31-65, health care providers may recommend pelvic exams and Pap testing every 3 years, or they may recommend the Pap and pelvic exam, combined with testing for human papilloma virus (HPV), every 5 years. Some types of HPV increase your risk of cervical cancer. Testing for HPV may also be done on women of any age with unclear Pap test results.  Other health care providers may not recommend any screening for nonpregnant women who are considered low risk for pelvic cancer and who do not have symptoms. Ask your health care provider if a screening pelvic exam is right for you.  If you have had past treatment for cervical cancer or a condition that could lead to cancer, you need Pap tests and screening for cancer for at least 20 years after your treatment. If Pap tests have been discontinued, your risk factors (such as having a new sexual partner) need to be reassessed to determine if screening should resume. Some women have medical problems that increase the chance of getting cervical cancer. In these cases, your health care provider may recommend more frequent screening and Pap tests.  Colorectal Cancer  This type of cancer can be detected and often prevented.  Routine colorectal cancer screening usually begins at 68 years of age and continues through 68 years of age.  Your health care provider may recommend screening at an earlier age if you have risk factors for colon cancer.  Your health care provider may also  recommend using home test kits to check for hidden blood in the stool.  A small camera at the end of a tube can be used to examine your colon directly (sigmoidoscopy or colonoscopy). This is done to check for the earliest forms of colorectal cancer.  Routine screening usually begins at age 41.  Direct examination of the colon should be repeated every 5-10 years through 68 years of age. However, you may need to be screened more often if early forms of precancerous polyps or small growths are found.  Skin Cancer  Check your skin from head to toe regularly.  Tell your health care provider about any new moles or changes in moles, especially if there is a change in a mole's shape or color.  Also tell your health care provider if you have a mole that is larger than the size of a pencil eraser.  Always use sunscreen. Apply sunscreen liberally and repeatedly throughout the day.  Protect yourself  by wearing long sleeves, pants, a wide-brimmed hat, and sunglasses whenever you are outside.  Heart disease, diabetes, and high blood pressure  High blood pressure causes heart disease and increases the risk of stroke. High blood pressure is more likely to develop in: ? People who have blood pressure in the high end of the normal range (130-139/85-89 mm Hg). ? People who are overweight or obese. ? People who are African American.  If you are 86-62 years of age, have your blood pressure checked every 3-5 years. If you are 46 years of age or older, have your blood pressure checked every year. You should have your blood pressure measured twice-once when you are at a hospital or clinic, and once when you are not at a hospital or clinic. Record the average of the two measurements. To check your blood pressure when you are not at a hospital or clinic, you can use: ? An automated blood pressure machine at a pharmacy. ? A home blood pressure monitor.  If you are between 50 years and 79 years old, ask your  health care provider if you should take aspirin to prevent strokes.  Have regular diabetes screenings. This involves taking a blood sample to check your fasting blood sugar level. ? If you are at a normal weight and have a low risk for diabetes, have this test once every three years after 68 years of age. ? If you are overweight and have a high risk for diabetes, consider being tested at a younger age or more often. Preventing infection Hepatitis B  If you have a higher risk for hepatitis B, you should be screened for this virus. You are considered at high risk for hepatitis B if: ? You were born in a country where hepatitis B is common. Ask your health care provider which countries are considered high risk. ? Your parents were born in a high-risk country, and you have not been immunized against hepatitis B (hepatitis B vaccine). ? You have HIV or AIDS. ? You use needles to inject street drugs. ? You live with someone who has hepatitis B. ? You have had sex with someone who has hepatitis B. ? You get hemodialysis treatment. ? You take certain medicines for conditions, including cancer, organ transplantation, and autoimmune conditions.  Hepatitis C  Blood testing is recommended for: ? Everyone born from [birth year] through 1966. ? Anyone with known risk factors for hepatitis C.  Sexually transmitted infections (STIs)  You should be screened for sexually transmitted infections (STIs) including gonorrhea and chlamydia if: ? You are sexually active and are younger than 68 years of age. ? You are older than 68 years of age and your health care provider tells you that you are at risk for this type of infection. ? Your sexual activity has changed since you were last screened and you are at an increased risk for chlamydia or gonorrhea. Ask your health care provider if you are at risk.  If you do not have HIV, but are at risk, it may be recommended that you take a prescription medicine daily to  prevent HIV infection. This is called pre-exposure prophylaxis (PrEP). You are considered at risk if: ? You are sexually active and do not regularly use condoms or know the HIV status of your partner(s). ? You take drugs by injection. ? You are sexually active with a partner who has HIV.  Talk with your health care provider about whether you are at high risk of being infected  with HIV. If you choose to begin PrEP, you should first be tested for HIV. You should then be tested every 3 months for as long as you are taking PrEP. Pregnancy  If you are premenopausal and you may become pregnant, ask your health care provider about preconception counseling.  If you may become pregnant, take 400 to 800 micrograms (mcg) of folic acid every day.  If you want to prevent pregnancy, talk to your health care provider about birth control (contraception). Osteoporosis and menopause  Osteoporosis is a disease in which the bones lose minerals and strength with aging. This can result in serious bone fractures. Your risk for osteoporosis can be identified using a bone density scan.  If you are 44 years of age or older, or if you are at risk for osteoporosis and fractures, ask your health care provider if you should be screened.  Ask your health care provider whether you should take a calcium or vitamin D supplement to lower your risk for osteoporosis.  Menopause may have certain physical symptoms and risks.  Hormone replacement therapy may reduce some of these symptoms and risks. Talk to your health care provider about whether hormone replacement therapy is right for you. Follow these instructions at home:  Schedule regular health, dental, and eye exams.  Stay current with your immunizations.  Do not use any tobacco products including cigarettes, chewing tobacco, or electronic cigarettes.  If you are pregnant, do not drink alcohol.  If you are breastfeeding, limit how much and how often you drink  alcohol.  Limit alcohol intake to no more than 1 drink per day for nonpregnant women. One drink equals 12 ounces of beer, 5 ounces of wine, or 1 ounces of hard liquor.  Do not use street drugs.  Do not share needles.  Ask your health care provider for help if you need support or information about quitting drugs.  Tell your health care provider if you often feel depressed.  Tell your health care provider if you have ever been abused or do not feel safe at home. This information is not intended to replace advice given to you by your health care provider. Make sure you discuss any questions you have with your health care provider. Document Released: 10/20/2010 Document Revised: 09/12/2015 Document Reviewed: 01/08/2015 Elsevier Interactive Patient Education  [employer].

## 2017-08-13 DIAGNOSIS — J3089 Other allergic rhinitis: Secondary | ICD-10-CM | POA: Diagnosis not present

## 2017-08-13 DIAGNOSIS — J301 Allergic rhinitis due to pollen: Secondary | ICD-10-CM | POA: Diagnosis not present

## 2017-08-17 DIAGNOSIS — H35371 Puckering of macula, right eye: Secondary | ICD-10-CM | POA: Diagnosis not present

## 2017-08-17 DIAGNOSIS — H2512 Age-related nuclear cataract, left eye: Secondary | ICD-10-CM | POA: Diagnosis not present

## 2017-08-17 DIAGNOSIS — H35372 Puckering of macula, left eye: Secondary | ICD-10-CM | POA: Diagnosis not present

## 2017-08-17 DIAGNOSIS — H2511 Age-related nuclear cataract, right eye: Secondary | ICD-10-CM | POA: Diagnosis not present

## 2017-09-09 DIAGNOSIS — J301 Allergic rhinitis due to pollen: Secondary | ICD-10-CM | POA: Diagnosis not present

## 2017-09-09 DIAGNOSIS — J3089 Other allergic rhinitis: Secondary | ICD-10-CM | POA: Diagnosis not present

## 2017-09-16 ENCOUNTER — Encounter: Payer: Self-pay | Admitting: Internal Medicine

## 2017-09-16 NOTE — Progress Notes (Signed)
Abstracted and sent to scan  

## 2017-09-17 ENCOUNTER — Telehealth: Payer: Self-pay | Admitting: Internal Medicine

## 2017-09-17 NOTE — Telephone Encounter (Signed)
Copied from [place] [id number]. Topic: Quick Communication - Rx Refill/Question >> Sep 17, 2017 11:55 AM Synthia Innocent wrote: Medication: FLUoxetine (PROZAC) 20 MG tablet, new dosage of 2 tablets once daily  Has the patient contacted their pharmacy? No. New dosage (Agent: If no, request that the patient contact the pharmacy for the refill.) (Agent: If yes, when and what did the pharmacy advise?)  Preferred Pharmacy (with phone number or street name): [facility] on [address]: Please be advised that RX refills may take up to 3 business days. We ask that you follow-up with your pharmacy.

## 2017-09-20 MED ORDER — FLUOXETINE HCL 20 MG PO TABS: 40.0000 mg | ORAL_TABLET | Freq: Every day | ORAL | 3 refills | Status: DC

## 2017-09-20 NOTE — Telephone Encounter (Signed)
Reviewed chart at last OV MD ok to increase to 40 mg. Sent updated script to pof.Marland KitchenJohny Chess

## 2017-09-20 NOTE — Telephone Encounter (Signed)
Pt requesting new prescription for new dosage of Fluoxetine (Prozac) 20mg  tab to take 2 tablets once a day.    LOV: 08/12/17 Dr. Telford Nab on [address]

## 2017-09-28 DIAGNOSIS — J3089 Other allergic rhinitis: Secondary | ICD-10-CM | POA: Diagnosis not present

## 2017-09-28 DIAGNOSIS — J301 Allergic rhinitis due to pollen: Secondary | ICD-10-CM | POA: Diagnosis not present

## 2017-10-07 ENCOUNTER — Other Ambulatory Visit: Payer: Self-pay

## 2017-10-07 NOTE — Telephone Encounter (Signed)
Called pharmacy and they have the refill but insurance wont pay till the 25th

## 2017-10-07 NOTE — Telephone Encounter (Signed)
Received a VM from patient stating that she needs a refill on her prozac. States that he dosage was increased so she ran out early. Please call patient.

## 2017-10-07 NOTE — Telephone Encounter (Signed)
We already sent in higher dosage on 09/20/17, did pharmacy not receive or did she contact pharmacy?

## 2017-10-08 ENCOUNTER — Telehealth: Payer: Self-pay | Admitting: Internal Medicine

## 2017-10-08 NOTE — Telephone Encounter (Signed)
Informed patient. She said that she will call back to schedule an appointment when they get back in town or go to an urgent care where she is if needed.

## 2017-10-08 NOTE — Telephone Encounter (Signed)
Noted  

## 2017-10-08 NOTE — Telephone Encounter (Signed)
Patient will need an appointment to discuss.

## 2017-10-08 NOTE — Telephone Encounter (Signed)
Copied from [place] [phone]. Topic: Quick Communication - See Telephone Encounter >> Oct 08, 2017  9:57 AM Nils Flack wrote: CRM for notification. See Telephone encounter for: 10/08/17. Pt would like to have a rx called in for toenail fungus.  She can not make appt.  She is out of town for 6 weeks.  If approved, please send to [facility] [place] [phone]

## 2017-10-08 NOTE — Telephone Encounter (Signed)
Should not be done without visit to discuss. This is not an emergency.

## 2017-10-15 DIAGNOSIS — J301 Allergic rhinitis due to pollen: Secondary | ICD-10-CM | POA: Diagnosis not present

## 2017-10-15 DIAGNOSIS — J3089 Other allergic rhinitis: Secondary | ICD-10-CM | POA: Diagnosis not present

## 2017-11-03 ENCOUNTER — Other Ambulatory Visit: Payer: Self-pay | Admitting: Internal Medicine

## 2017-11-15 DIAGNOSIS — J301 Allergic rhinitis due to pollen: Secondary | ICD-10-CM | POA: Diagnosis not present

## 2017-11-15 DIAGNOSIS — J3089 Other allergic rhinitis: Secondary | ICD-10-CM | POA: Diagnosis not present

## 2017-11-30 DIAGNOSIS — J3089 Other allergic rhinitis: Secondary | ICD-10-CM | POA: Diagnosis not present

## 2017-11-30 DIAGNOSIS — J301 Allergic rhinitis due to pollen: Secondary | ICD-10-CM | POA: Diagnosis not present

## 2017-12-01 ENCOUNTER — Other Ambulatory Visit: Payer: Self-pay | Admitting: Internal Medicine

## 2017-12-22 DIAGNOSIS — J301 Allergic rhinitis due to pollen: Secondary | ICD-10-CM | POA: Diagnosis not present

## 2017-12-22 DIAGNOSIS — J3089 Other allergic rhinitis: Secondary | ICD-10-CM | POA: Diagnosis not present

## 2017-12-23 DIAGNOSIS — D509 Iron deficiency anemia, unspecified: Secondary | ICD-10-CM | POA: Diagnosis not present

## 2018-01-19 DIAGNOSIS — J209 Acute bronchitis, unspecified: Secondary | ICD-10-CM | POA: Diagnosis not present

## 2018-01-19 DIAGNOSIS — J301 Allergic rhinitis due to pollen: Secondary | ICD-10-CM | POA: Diagnosis not present

## 2018-01-19 DIAGNOSIS — R05 Cough: Secondary | ICD-10-CM | POA: Diagnosis not present

## 2018-01-19 DIAGNOSIS — J019 Acute sinusitis, unspecified: Secondary | ICD-10-CM | POA: Diagnosis not present

## 2018-01-24 ENCOUNTER — Ambulatory Visit: Payer: PPO | Admitting: Internal Medicine

## 2018-01-24 ENCOUNTER — Encounter

## 2018-01-26 DIAGNOSIS — J3089 Other allergic rhinitis: Secondary | ICD-10-CM | POA: Diagnosis not present

## 2018-01-26 DIAGNOSIS — J301 Allergic rhinitis due to pollen: Secondary | ICD-10-CM | POA: Diagnosis not present

## 2018-02-15 ENCOUNTER — Other Ambulatory Visit: Payer: Self-pay | Admitting: Internal Medicine

## 2018-02-25 ENCOUNTER — Other Ambulatory Visit: Payer: Self-pay | Admitting: Internal Medicine

## 2018-03-07 DIAGNOSIS — H2512 Age-related nuclear cataract, left eye: Secondary | ICD-10-CM | POA: Diagnosis not present

## 2018-03-07 DIAGNOSIS — H2511 Age-related nuclear cataract, right eye: Secondary | ICD-10-CM | POA: Diagnosis not present

## 2018-03-07 DIAGNOSIS — H35371 Puckering of macula, right eye: Secondary | ICD-10-CM | POA: Diagnosis not present

## 2018-03-07 DIAGNOSIS — H35372 Puckering of macula, left eye: Secondary | ICD-10-CM | POA: Diagnosis not present

## 2018-03-11 DIAGNOSIS — J3089 Other allergic rhinitis: Secondary | ICD-10-CM | POA: Diagnosis not present

## 2018-03-11 DIAGNOSIS — J301 Allergic rhinitis due to pollen: Secondary | ICD-10-CM | POA: Diagnosis not present

## 2018-04-25 ENCOUNTER — Ambulatory Visit
Admission: RE | Admit: 2018-04-25 | Discharge: 2018-04-25 | Disposition: A | Discharge status: Home / Self Care | Payer: PPO | Source: Ambulatory Visit | Attending: Allergy | Admitting: Allergy

## 2018-04-25 ENCOUNTER — Other Ambulatory Visit: Payer: Self-pay | Admitting: Allergy

## 2018-04-25 DIAGNOSIS — J019 Acute sinusitis, unspecified: Secondary | ICD-10-CM | POA: Diagnosis not present

## 2018-04-25 DIAGNOSIS — R05 Cough: Secondary | ICD-10-CM | POA: Diagnosis not present

## 2018-04-25 DIAGNOSIS — J209 Acute bronchitis, unspecified: Secondary | ICD-10-CM | POA: Diagnosis not present

## 2018-04-25 DIAGNOSIS — J301 Allergic rhinitis due to pollen: Secondary | ICD-10-CM | POA: Diagnosis not present

## 2018-05-02 DIAGNOSIS — J301 Allergic rhinitis due to pollen: Secondary | ICD-10-CM | POA: Diagnosis not present

## 2018-05-02 DIAGNOSIS — H10413 Chronic giant papillary conjunctivitis, bilateral: Secondary | ICD-10-CM | POA: Diagnosis not present

## 2018-05-02 DIAGNOSIS — H16223 Keratoconjunctivitis sicca, not specified as Sjogren's, bilateral: Secondary | ICD-10-CM | POA: Diagnosis not present

## 2018-05-02 DIAGNOSIS — J3089 Other allergic rhinitis: Secondary | ICD-10-CM | POA: Diagnosis not present

## 2018-05-02 DIAGNOSIS — H35373 Puckering of macula, bilateral: Secondary | ICD-10-CM | POA: Diagnosis not present

## 2018-05-02 DIAGNOSIS — H2513 Age-related nuclear cataract, bilateral: Secondary | ICD-10-CM | POA: Diagnosis not present

## 2018-05-05 DIAGNOSIS — J301 Allergic rhinitis due to pollen: Secondary | ICD-10-CM | POA: Diagnosis not present

## 2018-05-05 DIAGNOSIS — J3089 Other allergic rhinitis: Secondary | ICD-10-CM | POA: Diagnosis not present

## 2018-05-14 ENCOUNTER — Other Ambulatory Visit: Payer: Self-pay | Admitting: Internal Medicine

## 2018-05-24 ENCOUNTER — Telehealth: Payer: Self-pay

## 2018-05-24 NOTE — Telephone Encounter (Signed)
I haven't seen any form, what surgery is she scheduled to have?

## 2018-05-24 NOTE — Telephone Encounter (Signed)
Patient just wanted to make an office visit to have her lungs listened to from recently being diagnosed with asthma. She wanted a peace of mind before getting elective surgery done on her face. Scheduled with Dr. Quay Burow tomorrow

## 2018-05-24 NOTE — Telephone Encounter (Signed)
Copied from [place] [phone]. Topic: Appointment Scheduling - Scheduling Inquiry for Clinic >> May 24, 2018  2:27 PM Virl Axe D wrote: Reason for CRM: Pt called to schedule surgical clearance appt with PCP Dr. [name] for today or 05/25/18 because pt is going out of town on 05/26/18. Dr. [name] is unavailable until 05/27/18. Pt would like to know if she can be worked in or seen by another provider. Surgery is scheduled for 06/06/18 and will be out of town until then. Please advise pt. CB#[phone]

## 2018-05-25 ENCOUNTER — Ambulatory Visit (INDEPENDENT_AMBULATORY_CARE_PROVIDER_SITE_OTHER): Payer: PPO | Admitting: Internal Medicine

## 2018-05-25 ENCOUNTER — Encounter: Payer: Self-pay | Admitting: Internal Medicine

## 2018-05-25 DIAGNOSIS — J301 Allergic rhinitis due to pollen: Secondary | ICD-10-CM | POA: Diagnosis not present

## 2018-05-25 DIAGNOSIS — J4530 Mild persistent asthma, uncomplicated: Secondary | ICD-10-CM

## 2018-05-25 DIAGNOSIS — K22719 Barrett's esophagus with dysplasia, unspecified: Secondary | ICD-10-CM | POA: Diagnosis not present

## 2018-05-25 DIAGNOSIS — J3089 Other allergic rhinitis: Secondary | ICD-10-CM | POA: Diagnosis not present

## 2018-05-25 DIAGNOSIS — J45909 Unspecified asthma, uncomplicated: Secondary | ICD-10-CM | POA: Insufficient documentation

## 2018-05-25 MED ORDER — MONTELUKAST SODIUM 10 MG PO TABS: 10.0000 mg | ORAL_TABLET | Freq: Every day | ORAL | Status: DC

## 2018-05-25 MED ORDER — FAMOTIDINE 40 MG PO TABS: 40.0000 mg | ORAL_TABLET | Freq: Every day | ORAL | 5 refills | Status: DC

## 2018-05-25 NOTE — Progress Notes (Signed)
Subjective:    Patient ID: Sheri Andrews, female    DOB: 09-16-1950, 68 y.o.   MRN: [id number]  HPI The patient is here for an acute visit.  She went traveling last fall in [place] and got sick on the boat.  She had a bad cough, but no fever.  She has been sick since then intermittently and was diagnosed with asthma during this time which is new.  She follows with an allergist because has allergies.  She currently gets no HA injection once a month.  Her allergies overall are controlled.  She is having intermittent infections and has been on different antibiotics and prednisone since the fall.  She is going to [place] and when she returns she will be having surgery on 2/17 and was concerned about her asthma.  She states that her breathing does not feel quite normal, but has not really felt normal since the fall.  She has occasional cough.  She denies any wheezing.  She does have a tightness in her epigastric region, but wonders if that is her hiatal hernia.  She feels like she breathes heavy when she exerts herself, which is not normal for her.  She is exercising irregularly since the fall.  She does have heartburn and takes pantoprazole twice a day.  2 weeks ago she did note some GERD symptoms, but typically has silent heartburn.  She does have a history of Barrett's esophagus.      Medications and allergies reviewed with patient and updated if appropriate.  Patient Active Problem List   Diagnosis Date Noted  . Essential hypertension 06/29/2017  . Routine general medical examination at a health care facility 05/15/2016  . Family history of early CAD 03/09/2014  . Osteopenia 02/01/2012  . BARRETTS ESOPHAGUS 10/08/2008  . NEPHROLITHIASIS 04/30/2007  . OCD (obsessive compulsive disorder) 04/11/2007  . HYPERCHOLESTEROLEMIA 04/08/2007  . IRRITABLE BOWEL SYNDROME 04/08/2007    Current Outpatient Medications on File Prior to Visit  Medication Sig Dispense Refill  . amLODipine (NORVASC)  10 MG tablet Take 1 tablet (10 mg total) by mouth daily. 90 tablet 3  . BIOTIN MAXIMUM PO Take by mouth 2 (two) times daily.    . budesonide-formoterol (SYMBICORT) 160-4.5 MCG/ACT inhaler Inhale 2 puffs into the lungs 2 (two) times daily.    . calcium carbonate (OS-CAL) 600 MG TABS Take 600 mg by mouth 2 (two) times daily with a meal.      . clonazePAM (KLONOPIN) 0.25 MG disintegrating tablet DISSOLVE ONE TABLET BY MOUTH EVERY DAY AS NEEDED 90 tablet 0  . EPIPEN 2-PAK 0.3 MG/0.3ML SOAJ injection   1  . estrogen, conjugated,-medroxyprogesterone (PREMPRO) 0.45-1.5 MG per tablet Take 1 tablet by mouth daily.    Marland Kitchen FLUoxetine (PROZAC) 20 MG tablet Take 2 tablets (40 mg total) by mouth daily. Follow-up appt is due must see provider for future refills 60 tablet 0  . Olopatadine HCl (PAZEO) 0.7 % SOLN Apply to eye daily. One drop in each eye daily    . pantoprazole (PROTONIX) 40 MG tablet TAKE 1 TABLET(40 MG) BY MOUTH TWICE DAILY 180 tablet 2  . Probiotic Product (PROBIOTIC COLON SUPPORT PO) Take 1 capsule by mouth daily.    . simvastatin (ZOCOR) 40 MG tablet TAKE 1 TABLET(40 MG) BY MOUTH DAILY AT 6 PM 90 tablet 1   No current facility-administered medications on file prior to visit.     Past Medical History:  Diagnosis Date  . Acid reflux disease   .  Allergic rhinitis   . Anxiety   . Barrett esophagus   . Dyspnea   . Dysthymia   . Hypercholesterolemia   . Irritable bowel syndrome   . Nephrolithiasis   . Other chest pain   . Status post dilation of esophageal narrowing     Past Surgical History:  Procedure Laterality Date  . 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  . TUBAL LIGATION  1986    Social History   Socioeconomic History  . Marital status: Married    Spouse name: Not on file  . Number of children: 2  . Years of education: Not on file  . Highest education level: Not on file  Occupational History  . Occupation: retired  Scientific laboratory technician  . Financial resource  strain: Not on file  . Food insecurity:    Worry: Not on file    Inability: Not on file  . Transportation needs:    Medical: Not on file    Non-medical: Not on file  Tobacco Use  . Smoking status: Never Smoker  . Smokeless tobacco: Never Used  Substance and Sexual Activity  . Alcohol use: Yes    Alcohol/week: 3.0 standard drinks    Types: 3 Standard drinks or equivalent per week    Comment: social  . Drug use: No  . Sexual activity: Not on file  Lifestyle  . Physical activity:    Days per week: Not on file    Minutes per session: Not on file  . Stress: Not on file  Relationships  . Social connections:    Talks on phone: Not on file    Gets together: Not on file    Attends religious service: Not on file    Active member of club or organization: Not on file    Attends meetings of clubs or organizations: Not on file    Relationship status: Not on file  Other Topics Concern  . Not on file  Social History Narrative  . Not on file    Family History  Problem Relation Age of Onset  . Heart disease Father   . Heart disease Mother   . Diabetes Mother   . Diabetes Sister   . Diabetes Brother   . Cancer Maternal Uncle        type unknown  . Throat cancer Paternal Uncle   . Heart attack Sister   . Colon cancer Neg Hx   . Stomach cancer Neg Hx     Review of Systems  Constitutional: Negative for chills and fever.  HENT: Negative for congestion, ear pain, sinus pain and sore throat.   Respiratory: Positive for cough, shortness of breath (breathing heavy with exertion) and wheezing. Negative for chest tightness.        Objective:   Vitals:   05/25/18 1018  BP: 116/72  Pulse: 98  Resp: 16  Temp: 98.2 F (36.8 C)  SpO2: 97%   BP Readings from Last 3 Encounters:  05/25/18 116/72  08/12/17 110/80  06/28/17 130/90   Wt Readings from Last 3 Encounters:  05/25/18 134 lb (60.8 kg)  08/12/17 136 lb (61.7 kg)  06/28/17 135 lb (61.2 kg)   Body mass index is 23.74  kg/m.   Physical Exam    GENERAL APPEARANCE: Appears stated age, well appearing, NAD EYES: conjunctiva clear, no icterus HEENT: bilateral tympanic membranes and ear canals normal, oropharynx with no erythema, no thyromegaly, trachea midline, no cervical or supraclavicular lymphadenopathy LUNGS: Clear to  auscultation without wheeze or crackles, unlabored breathing, good air entry bilaterally CARDIOVASCULAR: Normal S1,S2 without murmurs, no edema SKIN: Warm, dry       Assessment & Plan:    See Problem List for Assessment and Plan of chronic medical problems.

## 2018-05-25 NOTE — Patient Instructions (Addendum)
Start pepcid 40 mg daily for possible reflux.    Continue your current medicatons

## 2018-05-25 NOTE — Assessment & Plan Note (Signed)
Last endoscopy 2016-will be due this month Taking pantoprazole twice daily Has had silent GERD in the past 2 weeks ago she did have some subtle GERD symptoms-?  Truly controlled.  Discussed this may be flaring up her breathing issues/asthma Start Pepcid 40 mg daily to see if that helps Will be seeing GI next Monday Can discuss further with them

## 2018-05-25 NOTE — Assessment & Plan Note (Signed)
Has been diagnosed in the past 6 months with asthma Currently following with her allergist Taking Symbicort twice daily, rescue inhaler as needed She is not needing the rescue inhaler in a while She feels her breathing is not normal, has occasional cough and some shortness of breath with exertion No wheezing Lungs are clear No evidence of asthma exacerbation Allergies well controlled Discussed that her GERD could be causing some of her symptoms, especially since that has been silent in the past.  Likely due for an EGD soon-colonoscopy also due and she will discuss with GI We will start Pepcid 40 mg daily Follow-up with change in symptoms

## 2018-06-03 ENCOUNTER — Other Ambulatory Visit: Payer: Self-pay | Admitting: Internal Medicine

## 2018-06-03 ENCOUNTER — Ambulatory Visit (INDEPENDENT_AMBULATORY_CARE_PROVIDER_SITE_OTHER): Payer: PPO | Admitting: Internal Medicine

## 2018-06-03 ENCOUNTER — Encounter: Payer: Self-pay | Admitting: Internal Medicine

## 2018-06-03 ENCOUNTER — Telehealth: Payer: Self-pay | Admitting: Internal Medicine

## 2018-06-03 ENCOUNTER — Ambulatory Visit (INDEPENDENT_AMBULATORY_CARE_PROVIDER_SITE_OTHER)
Admission: RE | Admit: 2018-06-03 | Discharge: 2018-06-03 | Disposition: A | Discharge status: Home / Self Care | Payer: PPO | Source: Ambulatory Visit | Attending: Internal Medicine | Admitting: Internal Medicine

## 2018-06-03 DIAGNOSIS — R05 Cough: Secondary | ICD-10-CM

## 2018-06-03 DIAGNOSIS — R059 Cough, unspecified: Secondary | ICD-10-CM

## 2018-06-03 DIAGNOSIS — R0602 Shortness of breath: Secondary | ICD-10-CM | POA: Diagnosis not present

## 2018-06-03 LAB — POCT EXHALED NITRIC OXIDE: FeNO level (ppb): 33

## 2018-06-03 MED ORDER — BENZONATATE 200 MG PO CAPS: 200.0000 mg | ORAL_CAPSULE | Freq: Three times a day (TID) | ORAL | 0 refills | Status: DC | PRN

## 2018-06-03 NOTE — Telephone Encounter (Signed)
Copied from [place] [id number]. Topic: Quick Communication - See Telephone Encounter >> Jun 03, 2018  3:31 PM Blase Mess A wrote: CRM for notification. See Telephone encounter for: 06/03/18.  Patient is calling to receive her x ray results please advise [phone]

## 2018-06-03 NOTE — Progress Notes (Signed)
   Subjective:   Patient ID: Sheri Andrews, female    DOB: 04-11-1951, 68 y.o.   MRN: [id number]  HPI The patient is a 68 y.o. female coming in for cold symptoms. Started several months ago. In the fall she was in [place] and fell ill with a bad cough. Sought treatment when she returned from allergy specialist and they gave her prednisone and antibiotics. She took this and felt better. Some time later the cough and problems were returning so she went back. She was given then a stronger antibiotic and more prednisone. This again helped for some time. She is currently having some SOB and cough. She is having a lot of nose drainage. She is taking sinuglair and symbicort which is helping some maybe. She is not sure. Overall she has had the symptoms for months now. Denies fevers or chills. Overall it is stable.   Review of Systems  Constitutional: Positive for activity change and fatigue. Negative for appetite change, chills, fever and unexpected weight change.  HENT: Positive for congestion, postnasal drip, rhinorrhea and sinus pressure. Negative for ear discharge, ear pain, sinus pain, sneezing, sore throat, tinnitus, trouble swallowing and voice change.   Eyes: Negative.   Respiratory: Positive for cough and shortness of breath. Negative for chest tightness and wheezing.   Cardiovascular: Negative.   Gastrointestinal: Negative.   Musculoskeletal: Negative.   Neurological: Negative.     Objective:  Physical Exam Constitutional:      Appearance: She is well-developed.  HENT:     Head: Normocephalic and atraumatic.     Comments: Oropharynx with redness and clear drainage, nose with swollen turbinates, TMs normal bilaterally.  Neck:     Musculoskeletal: Normal range of motion.     Thyroid: No thyromegaly.  Cardiovascular:     Rate and Rhythm: Normal rate and regular rhythm.  Pulmonary:     Effort: Pulmonary effort is normal. No respiratory distress.     Breath sounds: Normal breath sounds.  No wheezing or rales.  Abdominal:     Palpations: Abdomen is soft.  Musculoskeletal:        General: No tenderness.  Lymphadenopathy:     Cervical: No cervical adenopathy.  Skin:    General: Skin is warm and dry.  Neurological:     Mental Status: She is alert and oriented to person, place, and time.     Vitals:   06/03/18 1019  BP: 100/72  Pulse: (!) 101  Temp: 98.6 F (37 C)  TempSrc: Oral  SpO2: 97%  Weight: 133 lb (60.3 kg)  Height: 5\' 3"  (1.6 m)   FENO: 33  Assessment & Plan:

## 2018-06-03 NOTE — Patient Instructions (Signed)
We will do the chest x-ray and call you back with the plan.

## 2018-06-03 NOTE — Assessment & Plan Note (Addendum)
Feno done in the office which was moderate at 33, she is already on symbicort so not clear benefit to prednisone today. Will check chest x-ray and adjust as needed. Asked to start taking otc allergy medicine as well as this can contribute to cough.

## 2018-06-09 ENCOUNTER — Encounter: Payer: Self-pay | Admitting: Gastroenterology

## 2018-06-15 DIAGNOSIS — J301 Allergic rhinitis due to pollen: Secondary | ICD-10-CM | POA: Diagnosis not present

## 2018-06-15 DIAGNOSIS — J3089 Other allergic rhinitis: Secondary | ICD-10-CM | POA: Diagnosis not present

## 2018-06-18 ENCOUNTER — Other Ambulatory Visit: Payer: Self-pay | Admitting: Internal Medicine

## 2018-06-22 DIAGNOSIS — J343 Hypertrophy of nasal turbinates: Secondary | ICD-10-CM | POA: Insufficient documentation

## 2018-06-22 DIAGNOSIS — J324 Chronic pansinusitis: Secondary | ICD-10-CM | POA: Diagnosis not present

## 2018-06-22 DIAGNOSIS — J302 Other seasonal allergic rhinitis: Secondary | ICD-10-CM | POA: Diagnosis not present

## 2018-06-29 DIAGNOSIS — J3089 Other allergic rhinitis: Secondary | ICD-10-CM | POA: Diagnosis not present

## 2018-06-29 DIAGNOSIS — J301 Allergic rhinitis due to pollen: Secondary | ICD-10-CM | POA: Diagnosis not present

## 2018-07-28 ENCOUNTER — Telehealth: Payer: Self-pay | Admitting: Internal Medicine

## 2018-07-28 NOTE — Telephone Encounter (Signed)
The only way I know of for this is through clinical studies and is not available commercially.

## 2018-07-28 NOTE — Telephone Encounter (Signed)
LVM with MD response  

## 2018-07-28 NOTE — Telephone Encounter (Signed)
Copied from [place] [id number]. Topic: General - Inquiry >> Jul 28, 2018 12:39 PM Margot Ables wrote: Reason for CRM: pt asking if Dr. [name] knows if there is testing available for "COVID19 Antibodies". She said that she came back from [place] and [place] in the fall with cough, fever, sob, etc. She states she has been treated by Dr. [name], Dr. Quay Burow, and allergy specialists over the past several months. She said that she gets winded now with exertion (noted in last OV) and was diagnosed with mild asthma which she uses her inhaler for and it helps. She is wondering if there is anything testing wise that may indicate she had COVID19 and if her blood can be used/tested to help others.

## 2018-07-30 ENCOUNTER — Other Ambulatory Visit: Payer: Self-pay | Admitting: Internal Medicine

## 2018-08-04 ENCOUNTER — Telehealth: Payer: Self-pay | Admitting: Internal Medicine

## 2018-08-04 NOTE — Telephone Encounter (Signed)
rec'd voice message on COVID 19 Question line; requested to have antibody testing for COVID 19.   Returned call to pt.  Reported she traveled to [place] last Fall, and became ill.  Stated she "had all the symptoms of the Coronavirus".  Reported she had fever in the beginning of her illness, at end of September.  Reported she had cough, shortness of breath, chest discomfort, body aches, and weakness. Stated she spent 3 weeks in [place], and became ill the last week she was there.  When she came home, she saw her Allergist, PCP, and ENT during the period from late Sept. to late January.  Was on different rounds of antibiotics, and on steroids.  Has developed Asthma, and uses an Inhaler to manage it.  Reported she continues to be very tired, but other symptoms have resolved.  Is requesting to have testing done, to determine if she has developed antibodies to the Coronavirus.  Advised that, at this time, antibody testing is not being done through [facility], but will send this note to PCP, to make her aware of pt's request.  Verb. Understanding.  Agrees with plan.

## 2018-08-04 NOTE — Telephone Encounter (Signed)
We still do not have the COVID-19 antibody testing yet, right?

## 2018-08-04 NOTE — Telephone Encounter (Signed)
Antibody testing is not approved yet and is only available for research purposes.

## 2018-08-05 NOTE — Telephone Encounter (Signed)
Patient informed of MD response and stated understanding  

## 2018-08-12 ENCOUNTER — Other Ambulatory Visit: Payer: Self-pay | Admitting: Internal Medicine

## 2018-09-05 ENCOUNTER — Other Ambulatory Visit: Payer: Self-pay | Admitting: Internal Medicine

## 2018-09-05 ENCOUNTER — Ambulatory Visit (INDEPENDENT_AMBULATORY_CARE_PROVIDER_SITE_OTHER): Payer: PPO | Admitting: Internal Medicine

## 2018-09-05 ENCOUNTER — Encounter: Payer: Self-pay | Admitting: Internal Medicine

## 2018-09-05 DIAGNOSIS — I10 Essential (primary) hypertension: Secondary | ICD-10-CM

## 2018-09-05 DIAGNOSIS — F429 Obsessive-compulsive disorder, unspecified: Secondary | ICD-10-CM

## 2018-09-05 DIAGNOSIS — J4530 Mild persistent asthma, uncomplicated: Secondary | ICD-10-CM | POA: Diagnosis not present

## 2018-09-05 DIAGNOSIS — E7800 Pure hypercholesterolemia, unspecified: Secondary | ICD-10-CM | POA: Diagnosis not present

## 2018-09-05 MED ORDER — AMLODIPINE BESYLATE 10 MG PO TABS: ORAL_TABLET | ORAL | 3 refills | Status: DC

## 2018-09-05 MED ORDER — SIMVASTATIN 40 MG PO TABS: ORAL_TABLET | ORAL | 3 refills | Status: DC

## 2018-09-05 MED ORDER — FLUOXETINE HCL 20 MG PO TABS: 40.0000 mg | ORAL_TABLET | Freq: Every day | ORAL | 3 refills | Status: DC

## 2018-09-05 MED ORDER — PANTOPRAZOLE SODIUM 40 MG PO TBEC: DELAYED_RELEASE_TABLET | ORAL | 3 refills | Status: DC

## 2018-09-05 NOTE — Progress Notes (Signed)
Virtual Visit via Video Note  I connected with Sheri Andrews on 09/05/18 at  4:00 PM EDT by a video enabled telemedicine application and verified that I am speaking with the correct person using two identifiers.  The patient and the provider were at separate locations throughout the entire encounter.   I discussed the limitations of evaluation and management by telemedicine and the availability of in person appointments. The patient expressed understanding and agreed to proceed.  History of Present Illness: The patient is a 68 y.o. female with visit for follow up GERD (taking protonix still, denies breakthrough symptoms, does get symptoms with missing doses, denies blood in stool or dark stool) and OCD (taking prozac 40 mg daily, for some reason last refill was for 20 mg daily, she is still taking 40 mg daily, denies SI/HI, symptoms are well controlled, coping well with the pandemic currently), and blood pressure (needs refill of amlodipine, denies side effects, BP at goal at home, denies chest pains or headaches).   Observations/Objective: Appearance: normal, breathing appears normal, casual grooming, abdomen does not appear distended, throat normal, memory normal, mental status is A and O times 3  Assessment and Plan: See problem oriented charting  Follow Up Instructions: refill meds, labs, check covid-19 antibody given prolonged illness after trip to [place]  I discussed the assessment and treatment plan with the patient. The patient was provided an opportunity to ask questions and all were answered. The patient agreed with the plan and demonstrated an understanding of the instructions.   The patient was advised to call back or seek an in-person evaluation if the symptoms worsen or if the condition fails to improve as anticipated.  [name], MD

## 2018-09-06 ENCOUNTER — Other Ambulatory Visit (INDEPENDENT_AMBULATORY_CARE_PROVIDER_SITE_OTHER): Payer: PPO

## 2018-09-06 DIAGNOSIS — I10 Essential (primary) hypertension: Secondary | ICD-10-CM

## 2018-09-06 LAB — CBC
HCT: 38.3 % (ref 36.0–46.0)
Hemoglobin: 13.3 g/dL (ref 12.0–15.0)
MCHC: 34.6 g/dL (ref 30.0–36.0)
MCV: 92.8 fl (ref 78.0–100.0)
Platelets: 255 10*3/uL (ref 150.0–400.0)
RBC: 4.13 Mil/uL (ref 3.87–5.11)
RDW: 13 % (ref 11.5–15.5)
WBC: 5.6 10*3/uL (ref 4.0–10.5)

## 2018-09-06 LAB — LIPID PANEL
Cholesterol: 173 mg/dL (ref 0–200)
HDL: 40.3 mg/dL (ref >39.00)
NonHDL: 132.61
Total CHOL/HDL Ratio: 4
Triglycerides: 211 mg/dL — ABNORMAL HIGH (ref 0.0–149.0)
VLDL: 42.2 mg/dL — ABNORMAL HIGH (ref 0.0–40.0)

## 2018-09-06 LAB — COMPREHENSIVE METABOLIC PANEL
ALT: 12 U/L (ref 0–35)
AST: 18 U/L (ref 0–37)
Albumin: 4.4 g/dL (ref 3.5–5.2)
Alkaline Phosphatase: 35 U/L — ABNORMAL LOW (ref 39–117)
BUN: 16 mg/dL (ref 6–23)
CO2: 27 mEq/L (ref 19–32)
Calcium: 9.3 mg/dL (ref 8.4–10.5)
Chloride: 102 mEq/L (ref 96–112)
Creatinine, Ser: 0.85 mg/dL (ref 0.40–1.20)
GFR: 66.42 mL/min (ref >60.00)
Glucose, Bld: 95 mg/dL (ref 70–99)
Potassium: 3.3 mEq/L — ABNORMAL LOW (ref 3.5–5.1)
Sodium: 137 mEq/L (ref 135–145)
Total Bilirubin: 0.3 mg/dL (ref 0.2–1.2)
Total Protein: 7.2 g/dL (ref 6.0–8.3)

## 2018-09-06 LAB — LDL CHOLESTEROL, DIRECT: Direct LDL: 102 mg/dL

## 2018-09-06 NOTE — Assessment & Plan Note (Signed)
Refilled prozac for 40 mg daily and she is doing well overall.

## 2018-09-06 NOTE — Assessment & Plan Note (Signed)
Checking lipid panel and adjust simvastatin as needed.  

## 2018-09-06 NOTE — Assessment & Plan Note (Signed)
She would like to have covid-19 antibody testing to see if perhaps the prolonged illness which has caused her to have asthma at this time was covid-19. She did have travel to [place] prior to illness.

## 2018-09-06 NOTE — Assessment & Plan Note (Signed)
BP at goal on amlodipine. Checking CMP and adjust as needed.  

## 2018-09-13 ENCOUNTER — Telehealth: Payer: Self-pay | Admitting: Internal Medicine

## 2018-09-13 NOTE — Telephone Encounter (Signed)
Patient called following up on her antibody test. Unable to see results.  Please advise.

## 2018-09-13 NOTE — Telephone Encounter (Signed)
Sheri Andrews/lab not sure why blood was not collected for covid antibody test---still showing active,future---Sheri Andrews has called patient, she is out of town this week, but will return to lab next week to have drawn again

## 2018-09-21 ENCOUNTER — Other Ambulatory Visit: Payer: PPO

## 2018-09-21 DIAGNOSIS — J4530 Mild persistent asthma, uncomplicated: Secondary | ICD-10-CM | POA: Diagnosis not present

## 2018-09-21 DIAGNOSIS — J3089 Other allergic rhinitis: Secondary | ICD-10-CM | POA: Diagnosis not present

## 2018-09-21 DIAGNOSIS — J301 Allergic rhinitis due to pollen: Secondary | ICD-10-CM | POA: Diagnosis not present

## 2018-09-21 LAB — SAR COV2 SEROLOGY (COVID19)AB(IGG),IA: SARS CoV2 AB IGG: NEGATIVE

## 2018-09-26 DIAGNOSIS — J301 Allergic rhinitis due to pollen: Secondary | ICD-10-CM | POA: Diagnosis not present

## 2018-09-26 DIAGNOSIS — J3089 Other allergic rhinitis: Secondary | ICD-10-CM | POA: Diagnosis not present

## 2018-09-29 DIAGNOSIS — J301 Allergic rhinitis due to pollen: Secondary | ICD-10-CM | POA: Diagnosis not present

## 2018-09-29 DIAGNOSIS — J3089 Other allergic rhinitis: Secondary | ICD-10-CM | POA: Diagnosis not present

## 2018-10-03 DIAGNOSIS — J3089 Other allergic rhinitis: Secondary | ICD-10-CM | POA: Diagnosis not present

## 2018-10-03 DIAGNOSIS — J301 Allergic rhinitis due to pollen: Secondary | ICD-10-CM | POA: Diagnosis not present

## 2018-10-06 ENCOUNTER — Other Ambulatory Visit: Payer: Self-pay | Admitting: Internal Medicine

## 2018-10-11 ENCOUNTER — Telehealth: Payer: Self-pay | Admitting: Internal Medicine

## 2018-10-11 DIAGNOSIS — J3089 Other allergic rhinitis: Secondary | ICD-10-CM | POA: Diagnosis not present

## 2018-10-11 DIAGNOSIS — J301 Allergic rhinitis due to pollen: Secondary | ICD-10-CM | POA: Diagnosis not present

## 2018-10-11 NOTE — Telephone Encounter (Signed)
Spoke with Sheri Andrews regarding AWV. Patient stated that she will give office a call back sometime in Fall 2020 to schedule her appointment. SF

## 2018-10-13 DIAGNOSIS — J301 Allergic rhinitis due to pollen: Secondary | ICD-10-CM | POA: Diagnosis not present

## 2018-10-13 DIAGNOSIS — J3089 Other allergic rhinitis: Secondary | ICD-10-CM | POA: Diagnosis not present

## 2018-10-24 ENCOUNTER — Telehealth: Payer: Self-pay | Admitting: Gastroenterology

## 2018-10-24 NOTE — Telephone Encounter (Signed)
Patient called in wanting to speak to the nurse. She stated that after she uses the bathroom (bowel movement) her stomachs aches for hours. She stated that the bowel movements aren't hard or real soft.

## 2018-10-24 NOTE — Telephone Encounter (Signed)
Patient is out of town. Calls with complaints of abdominal pain (mostly to the right) feeling gassy and bloated after a bowel movement. Felt constipated last week. States she typically does not have a bowel movement more than every other to every 3rd day. Stool is not hard. Feels emptied afterwards, then the pain starts. She sees mucous with the bowel movement. Stools float. Denies blood with stool. She has tried Ibuprofen and Pepto Bismol without relief.No fever. Scheduled an appointment 11/08/18 with APP. Please advise.

## 2018-10-25 NOTE — Telephone Encounter (Signed)
Patient questions the Miralax but agrees to use it daily as advised. She will purchase the IBgard and take TID PRN. Keep her appointment. Call if she acutely worsens.

## 2018-10-25 NOTE — Telephone Encounter (Signed)
Ok please advise patient to start taking Miralax 1 capful daily. IB gard 1 capsule TID prn. Follow up with APP as scheduled. Thanks

## 2018-10-31 ENCOUNTER — Other Ambulatory Visit: Payer: Self-pay | Admitting: Internal Medicine

## 2018-11-04 ENCOUNTER — Telehealth: Payer: Self-pay | Admitting: Internal Medicine

## 2018-11-04 DIAGNOSIS — J301 Allergic rhinitis due to pollen: Secondary | ICD-10-CM | POA: Diagnosis not present

## 2018-11-04 NOTE — Telephone Encounter (Signed)
Pt called and stated that she would like an RX of Ciclopirox topical solution. Pt states that daughter uses this for toe fungus. Please advise   [place] [address] [facility] [place] [phone] (Phone) [phone] (Fax)

## 2018-11-07 DIAGNOSIS — J3089 Other allergic rhinitis: Secondary | ICD-10-CM | POA: Diagnosis not present

## 2018-11-07 DIAGNOSIS — J301 Allergic rhinitis due to pollen: Secondary | ICD-10-CM | POA: Diagnosis not present

## 2018-11-07 NOTE — Telephone Encounter (Signed)
Pt will need to make an appt for evaluation before med can be prescribe. MD is out of the office this week. Pls schedule appt  w/another provider .Marland KitchenJohny Andrews

## 2018-11-07 NOTE — Telephone Encounter (Signed)
Patient would like to wait for Dr. Charlynne Cousins appt.  Pt states she is out of town on vacation this week as well.

## 2018-11-08 ENCOUNTER — Ambulatory Visit: Payer: PPO | Admitting: Physician Assistant

## 2018-11-14 NOTE — Telephone Encounter (Signed)
noted 

## 2018-11-16 DIAGNOSIS — J301 Allergic rhinitis due to pollen: Secondary | ICD-10-CM | POA: Diagnosis not present

## 2018-11-16 DIAGNOSIS — J3089 Other allergic rhinitis: Secondary | ICD-10-CM | POA: Diagnosis not present

## 2018-11-18 DIAGNOSIS — J3089 Other allergic rhinitis: Secondary | ICD-10-CM | POA: Diagnosis not present

## 2018-11-18 DIAGNOSIS — J301 Allergic rhinitis due to pollen: Secondary | ICD-10-CM | POA: Diagnosis not present

## 2018-11-21 ENCOUNTER — Other Ambulatory Visit: Payer: Self-pay

## 2018-11-25 ENCOUNTER — Encounter: Payer: Self-pay | Admitting: Internal Medicine

## 2018-11-25 ENCOUNTER — Ambulatory Visit (INDEPENDENT_AMBULATORY_CARE_PROVIDER_SITE_OTHER): Payer: PPO | Admitting: Internal Medicine

## 2018-11-25 ENCOUNTER — Telehealth: Payer: Self-pay

## 2018-11-25 DIAGNOSIS — B351 Tinea unguium: Secondary | ICD-10-CM

## 2018-11-25 MED ORDER — CICLOPIROX 8 % EX SOLN: Freq: Every day | CUTANEOUS | 6 refills | Status: DC

## 2018-11-25 NOTE — Telephone Encounter (Signed)
Copied from [place] [id number]. Topic: General - Inquiry >> Nov 25, 2018  9:52 AM Richardo Priest, NT wrote: Reason for CRM: Patient called in stating that lab work done in May needs to be faxed to [facility] [phone]. Please advise. Patient is also wanting a toe varnish to be ordered for her toe fungus instead of a pill. Please advise. Call back is [phone]. If script called in it will be in [place] in [place]

## 2018-11-25 NOTE — Telephone Encounter (Signed)
Can you make patient an virtual to discuss. Thank you

## 2018-11-25 NOTE — Telephone Encounter (Signed)
I can fax the labs, but do you want some sort of visit to discuss the toe fungus?

## 2018-11-25 NOTE — Telephone Encounter (Signed)
Should have visit, can be virtual

## 2018-11-25 NOTE — Telephone Encounter (Signed)
Scheduled

## 2018-11-25 NOTE — Assessment & Plan Note (Signed)
Rx ciclopirox to use for 48 weeks to help with resolution.

## 2018-11-25 NOTE — Progress Notes (Signed)
Virtual Visit via Video Note  I connected with Sheri Andrews on 11/25/18 at  1:40 PM EDT by a video enabled telemedicine application and verified that I am speaking with the correct person using two identifiers.  The patient and the provider were at separate locations throughout the entire encounter.   I discussed the limitations of evaluation and management by telemedicine and the availability of in person appointments. The patient expressed understanding and agreed to proceed.  History of Present Illness: The patient is a 68 y.o. female with visit for toenail fungus. Started months ago. Has no pain associated with this. She has seen her daughter use a topical agent which has caused some benefit. Denies fevers or chills. Overall it is stable. Has tried nothing  Left big toe and 4th toe, right foot big toe and 4th toe  Observations/Objective: Appearance: normal, breathing appears normal, casual grooming, abdomen does not appear distended, throat normal, memory normal, mental status is A and O times 3  Assessment and Plan: See problem oriented charting  Follow Up Instructions: rx ciclopirox treatment 48 weeks  I discussed the assessment and treatment plan with the patient. The patient was provided an opportunity to ask questions and all were answered. The patient agreed with the plan and demonstrated an understanding of the instructions.   The patient was advised to call back or seek an in-person evaluation if the symptoms worsen or if the condition fails to improve as anticipated.  [name], MD

## 2018-12-09 DIAGNOSIS — J3089 Other allergic rhinitis: Secondary | ICD-10-CM | POA: Diagnosis not present

## 2018-12-09 DIAGNOSIS — J301 Allergic rhinitis due to pollen: Secondary | ICD-10-CM | POA: Diagnosis not present

## 2018-12-12 DIAGNOSIS — Z1231 Encounter for screening mammogram for malignant neoplasm of breast: Secondary | ICD-10-CM | POA: Diagnosis not present

## 2018-12-12 DIAGNOSIS — J301 Allergic rhinitis due to pollen: Secondary | ICD-10-CM | POA: Diagnosis not present

## 2018-12-12 DIAGNOSIS — J3089 Other allergic rhinitis: Secondary | ICD-10-CM | POA: Diagnosis not present

## 2018-12-12 DIAGNOSIS — R05 Cough: Secondary | ICD-10-CM | POA: Diagnosis not present

## 2018-12-12 DIAGNOSIS — H1045 Other chronic allergic conjunctivitis: Secondary | ICD-10-CM | POA: Diagnosis not present

## 2018-12-14 DIAGNOSIS — Z1389 Encounter for screening for other disorder: Secondary | ICD-10-CM | POA: Diagnosis not present

## 2018-12-14 DIAGNOSIS — Z79890 Hormone replacement therapy (postmenopausal): Secondary | ICD-10-CM | POA: Diagnosis not present

## 2018-12-14 DIAGNOSIS — Z01419 Encounter for gynecological examination (general) (routine) without abnormal findings: Secondary | ICD-10-CM | POA: Diagnosis not present

## 2018-12-14 DIAGNOSIS — Z130 Encounter for screening for diseases of the blood and blood-forming organs and certain disorders involving the immune mechanism: Secondary | ICD-10-CM | POA: Diagnosis not present

## 2018-12-14 DIAGNOSIS — E669 Obesity, unspecified: Secondary | ICD-10-CM | POA: Diagnosis not present

## 2018-12-14 DIAGNOSIS — Z124 Encounter for screening for malignant neoplasm of cervix: Secondary | ICD-10-CM | POA: Diagnosis not present

## 2019-01-04 DIAGNOSIS — J301 Allergic rhinitis due to pollen: Secondary | ICD-10-CM | POA: Diagnosis not present

## 2019-01-04 DIAGNOSIS — J3089 Other allergic rhinitis: Secondary | ICD-10-CM | POA: Diagnosis not present

## 2019-01-06 DIAGNOSIS — J3089 Other allergic rhinitis: Secondary | ICD-10-CM | POA: Diagnosis not present

## 2019-01-06 DIAGNOSIS — J301 Allergic rhinitis due to pollen: Secondary | ICD-10-CM | POA: Diagnosis not present

## 2019-01-10 DIAGNOSIS — J301 Allergic rhinitis due to pollen: Secondary | ICD-10-CM | POA: Diagnosis not present

## 2019-01-10 DIAGNOSIS — J3089 Other allergic rhinitis: Secondary | ICD-10-CM | POA: Diagnosis not present

## 2019-01-20 DIAGNOSIS — J3089 Other allergic rhinitis: Secondary | ICD-10-CM | POA: Diagnosis not present

## 2019-01-20 DIAGNOSIS — J301 Allergic rhinitis due to pollen: Secondary | ICD-10-CM | POA: Diagnosis not present

## 2019-01-23 DIAGNOSIS — Z23 Encounter for immunization: Secondary | ICD-10-CM | POA: Diagnosis not present

## 2019-01-23 DIAGNOSIS — J3089 Other allergic rhinitis: Secondary | ICD-10-CM | POA: Diagnosis not present

## 2019-01-23 DIAGNOSIS — J301 Allergic rhinitis due to pollen: Secondary | ICD-10-CM | POA: Diagnosis not present

## 2019-01-26 DIAGNOSIS — J301 Allergic rhinitis due to pollen: Secondary | ICD-10-CM | POA: Diagnosis not present

## 2019-01-26 DIAGNOSIS — J3089 Other allergic rhinitis: Secondary | ICD-10-CM | POA: Diagnosis not present

## 2019-02-07 DIAGNOSIS — J301 Allergic rhinitis due to pollen: Secondary | ICD-10-CM | POA: Diagnosis not present

## 2019-02-07 DIAGNOSIS — J3089 Other allergic rhinitis: Secondary | ICD-10-CM | POA: Diagnosis not present

## 2019-02-21 ENCOUNTER — Other Ambulatory Visit: Payer: Self-pay

## 2019-02-21 ENCOUNTER — Encounter: Payer: Self-pay | Admitting: Gastroenterology

## 2019-02-21 ENCOUNTER — Ambulatory Visit: Payer: PPO | Admitting: Gastroenterology

## 2019-02-21 ENCOUNTER — Other Ambulatory Visit (INDEPENDENT_AMBULATORY_CARE_PROVIDER_SITE_OTHER): Payer: PPO

## 2019-02-21 VITALS — BP 104/72 | HR 100 | Temp 97.8°F | Ht 62.5 in | Wt 135.0 lb

## 2019-02-21 DIAGNOSIS — Z1211 Encounter for screening for malignant neoplasm of colon: Secondary | ICD-10-CM

## 2019-02-21 DIAGNOSIS — J3089 Other allergic rhinitis: Secondary | ICD-10-CM | POA: Diagnosis not present

## 2019-02-21 DIAGNOSIS — K921 Melena: Secondary | ICD-10-CM

## 2019-02-21 DIAGNOSIS — Z1159 Encounter for screening for other viral diseases: Secondary | ICD-10-CM

## 2019-02-21 DIAGNOSIS — K219 Gastro-esophageal reflux disease without esophagitis: Secondary | ICD-10-CM | POA: Diagnosis not present

## 2019-02-21 DIAGNOSIS — J301 Allergic rhinitis due to pollen: Secondary | ICD-10-CM | POA: Diagnosis not present

## 2019-02-21 LAB — IBC + FERRITIN
Ferritin: 72.1 ng/mL (ref 10.0–291.0)
Iron: 112 ug/dL (ref 42–145)
Saturation Ratios: 30.5 % (ref 20.0–50.0)
Transferrin: 262 mg/dL (ref 212.0–360.0)

## 2019-02-21 LAB — CBC WITH DIFFERENTIAL/PLATELET
Basophils Absolute: 0 10*3/uL (ref 0.0–0.1)
Basophils Relative: 0.4 % (ref 0.0–3.0)
Eosinophils Absolute: 0.1 10*3/uL (ref 0.0–0.7)
Eosinophils Relative: 1.8 % (ref 0.0–5.0)
HCT: 39.9 % (ref 36.0–46.0)
Hemoglobin: 13.5 g/dL (ref 12.0–15.0)
Lymphocytes Relative: 48.6 % — ABNORMAL HIGH (ref 12.0–46.0)
Lymphs Abs: 2.3 10*3/uL (ref 0.7–4.0)
MCHC: 33.9 g/dL (ref 30.0–36.0)
MCV: 91.4 fl (ref 78.0–100.0)
Monocytes Absolute: 0.5 10*3/uL (ref 0.1–1.0)
Monocytes Relative: 10.3 % (ref 3.0–12.0)
Neutro Abs: 1.8 10*3/uL (ref 1.4–7.7)
Neutrophils Relative %: 38.9 % — ABNORMAL LOW (ref 43.0–77.0)
Platelets: 246 10*3/uL (ref 150.0–400.0)
RBC: 4.36 Mil/uL (ref 3.87–5.11)
RDW: 13.6 % (ref 11.5–15.5)
WBC: 4.6 10*3/uL (ref 4.0–10.5)

## 2019-02-21 MED ORDER — SUPREP BOWEL PREP KIT 17.5-3.13-1.6 GM/177ML PO SOLN: 1.0000 | Freq: Once | ORAL | 0 refills | Status: AC

## 2019-02-21 NOTE — Patient Instructions (Addendum)
Due to recent COVID-19 restrictions implemented by [employer] and state authorities and in an effort to keep both patients and staff as safe as possible, [place] requires COVID-19 testing prior to any scheduled endoscopic procedure. The testing center is located at [address] [place], [place] [address] in the [facility]  suite.  Your appointment has been scheduled for 03/06/2019 at 10am.   Please bring your insurance cards to this appointment. You will require your COVID screen 2 business days prior to your endoscopic procedure.  You are not required to quarantine after your screening.  You will only receive a phone call with the results if it is POSITIVE.  If you do not receive a call the day before your procedure you should begin your prep, if ordered, and you should report to the endo center for your procedure at your designated appointment arrival time ( one hour prior to the procedure time). There is no cost to you for the screening on the day of the swab.  [facility] will file with your insurance company for the testing.    You may receive an automated phone call prior to your procedure or have a message in your MyChart that you have an appointment for a BP/15 at the [facility], please disregard this message.  Your testing will be at the [place] [place] location.   If you are leaving [facility] travel [place] on [address], turn left onto [address], turn night onto [address] at the 1st stop light turn right, pass the [employer] on your right and proceed to [address] (white building).   Go to the basement for labs today   You have been scheduled for an endoscopy and colonoscopy. Please follow the written instructions given to you at your visit today. Please pick up your prep supplies at the pharmacy within  the next 1-3 days. If you use inhalers (even only as needed), please bring them with you on the day of your procedure.   If you are age 80 or older, your body mass index should be between 23-30. Your Body mass index is 24.3 kg/m. If this is out of the aforementioned range listed, please consider follow up with your Primary Care Provider.  If you are age 27 or younger, your body mass index should be between 19-25. Your Body mass index is 24.3 kg/m. If this is out of the aformentioned range listed, please consider follow up with your Primary Care Provider.    I appreciate the  opportunity to care for you  Thank You   Harl Bowie , MD

## 2019-02-21 NOTE — Progress Notes (Signed)
Sheri Andrews    [id number]    Apr 07, 1951  Primary Care Physician:Crawford, Real Cons, MD  Referring Physician: [name], MD [place],  [place] [address]   Chief complaint:  Melena, IBS HPI: 68 yr very pleasant Caucasian female here for follow-up visit. She has been having on and off black stool for past few months.  She came home sick from [place] and Sep 2019 with upper respiratory symptoms, headaches and low grade fever.  She was taking on and off aleve at the time but nothing recently.  She thinks she may have had Covid at that time. She is having irregular bowel movements once every 2 to 3 days, complains of right lower quadrant abdominal pain and she has lost 3 pounds in the past few months.  Denies any bright red blood per rectum.  No nausea or vomiting.  No dysphagia. No family history of GI malignancy or IBD.  EGD April 01, 2015: Distal esophageal stricture, wide open.  No evidence of Barrett's esophagus.  Sliding hiatal hernia.  Gastropathy  Colonoscopy June 07, 2008: Erythema in rectum and rectosigmoid otherwise normal exam.  Left colon and rectum biopsies negative for any specific abnormality.  Outpatient Encounter Medications as of 02/21/2019  Medication Sig  . amLODipine (NORVASC) 10 MG tablet TAKE 1 TABLET(10 MG) BY MOUTH DAILY  . BIOTIN MAXIMUM PO Take by mouth 2 (two) times daily.  . budesonide-formoterol (SYMBICORT) 160-4.5 MCG/ACT inhaler Inhale 2 puffs into the lungs 2 (two) times daily.  . Calcium-Vitamin D-Vitamin K (CALCIUM + D) [phone] MG-UNT-MCG CHEW Chew by mouth.  . ciclopirox (PENLAC) 8 % solution Apply topically at bedtime. Apply over nail and surrounding skin. Apply daily over previous coat. After seven (7) days, may remove with alcohol and continue cycle.  . clonazePAM (KLONOPIN) 0.25 MG disintegrating tablet DISSOLVE ONE TABLET BY MOUTH EVERY DAY AS NEEDED  . EPIPEN 2-PAK 0.3 MG/0.3ML SOAJ injection    . estrogen, conjugated,-medroxyprogesterone (PREMPRO) 0.45-1.5 MG per tablet Take 1 tablet by mouth daily.  . famotidine (PEPCID) 40 MG tablet Take 1 tablet (40 mg total) by mouth daily.  Marland Kitchen FLUoxetine (PROZAC) 20 MG tablet Take 2 tablets (40 mg total) by mouth daily.  . montelukast (SINGULAIR) 10 MG tablet Take 1 tablet (10 mg total) by mouth at bedtime.  . Olopatadine HCl (PAZEO) 0.7 % SOLN Apply to eye daily. One drop in each eye daily  . pantoprazole (PROTONIX) 40 MG tablet TAKE 1 TABLET(40 MG) BY MOUTH TWICE DAILY  . Peppermint Oil (IBGARD PO) Take by mouth.  . polyethylene glycol (MIRALAX / GLYCOLAX) 17 g packet Take 17 g by mouth daily.  . Probiotic Product (PROBIOTIC COLON SUPPORT PO) Take 1 capsule by mouth daily.  . simvastatin (ZOCOR) 40 MG tablet TAKE 1 TABLET(40 MG) BY MOUTH DAILY AT 6 PM  . [DISCONTINUED] calcium carbonate (OS-CAL) 600 MG TABS Take 600 mg by mouth 2 (two) times daily with a meal.     No facility-administered encounter medications on file as of 02/21/2019.     Allergies as of 02/21/2019 - Review Complete 02/21/2019  Allergen Reaction Noted  . Bupropion hcl    . Doxycycline hyclate  02/25/2005  . Erythromycin ethylsuccinate  02/25/2005    Past Medical History:  Diagnosis Date  . Acid reflux disease   . Allergic rhinitis   . Anxiety   . Barrett esophagus   . Dyspnea   .  Dysthymia   . Hypercholesterolemia   . Irritable bowel syndrome   . Nephrolithiasis   . Other chest pain   . Status post dilation of esophageal narrowing     Past Surgical History:  Procedure Laterality Date  . FACIAL COSMETIC SURGERY    . 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  . TUBAL LIGATION  1986    Family History  Problem Relation Age of Onset  . Heart disease Father   . Heart disease Mother   . Diabetes Mother   . Diabetes Sister   . Diabetes Brother   . Cancer Maternal Uncle        type unknown  . Throat cancer Paternal Uncle   . Heart attack  Sister   . Colon cancer Neg Hx   . Stomach cancer Neg Hx   . Rectal cancer Neg Hx   . Pancreatic cancer Neg Hx     Social History   Socioeconomic History  . Marital status: Married    Spouse name: Not on file  . Number of children: 2  . Years of education: Not on file  . Highest education level: Not on file  Occupational History  . Occupation: retired  Scientific laboratory technician  . Financial resource strain: Not on file  . Food insecurity    Worry: Not on file    Inability: Not on file  . Transportation needs    Medical: Not on file    Non-medical: Not on file  Tobacco Use  . Smoking status: Never Smoker  . Smokeless tobacco: Never Used  Substance and Sexual Activity  . Alcohol use: Yes    Alcohol/week: 3.0 standard drinks    Types: 3 Standard drinks or equivalent per week    Comment: social  . Drug use: No  . Sexual activity: Yes    Partners: Male  Lifestyle  . Physical activity    Days per week: Not on file    Minutes per session: Not on file  . Stress: Not on file  Relationships  . Social Herbalist on phone: Not on file    Gets together: Not on file    Attends religious service: Not on file    Active member of club or organization: Not on file    Attends meetings of clubs or organizations: Not on file    Relationship status: Not on file  . Intimate partner violence    Fear of current or ex partner: Not on file    Emotionally abused: Not on file    Physically abused: Not on file    Forced sexual activity: Not on file  Other Topics Concern  . Not on file  Social History Narrative  . Not on file      Review of systems: Review of Systems  Constitutional: Negative for fever and chills.  Positive for lack of energy HENT: Positive for sinus problem Eyes: Negative for blurred vision.  Respiratory: Negative for cough, shortness of breath and wheezing.   Cardiovascular: Negative for chest pain and palpitations.  Gastrointestinal: as per HPI Genitourinary:  Negative for dysuria, urgency, frequency and hematuria.  Musculoskeletal: Negative for myalgias, back pain and joint pain.  Skin: Negative for itching and rash.  Neurological: Negative for dizziness, tremors, focal weakness, seizures and loss of consciousness.  Endo/Heme/Allergies: Positive for seasonal allergies.  Psychiatric/Behavioral: Negative for depression, suicidal ideas and hallucinations.  Positive for insomnia All other systems reviewed and are negative.  Physical Exam: Vitals:   02/21/19 1005  Temp: 97.8 F (36.6 C)   Body mass index is 24.3 kg/m. Gen:      No acute distress HEENT:  EOMI, sclera anicteric Neck:     No masses; no thyromegaly Lungs:    Clear to auscultation bilaterally; normal respiratory effort CV:         Regular rate and rhythm; no murmurs Abd:      + bowel sounds; soft, non-tender; no palpable masses, no distension Ext:    No edema; adequate peripheral perfusion Skin:      Warm and dry; no rash Neuro: alert and oriented x 3 Psych: normal mood and affect  Data Reviewed:  Reviewed labs, radiology imaging, old records and pertinent past GI work up   Assessment and Plan/Recommendations:  68 year old female with history of chronic GERD and IBS here for follow-up visit with complaints of melena, abdominal discomfort and change in bowel habits  Check CBC and iron panel She is due for colorectal cancer screening, will schedule EGD along with colonoscopy for further evaluation of melena  Avoid NSAIDs  GERD: Continue daily Protonix Discussed antireflux measures and lifestyle modifications  The risks and benefits as well as alternatives of endoscopic procedure(s) have been discussed and reviewed. All questions answered. The patient agrees to proceed.   Damaris Hippo , MD    CC: [name], *

## 2019-02-22 ENCOUNTER — Encounter: Payer: Self-pay | Admitting: Gastroenterology

## 2019-02-27 DIAGNOSIS — J3089 Other allergic rhinitis: Secondary | ICD-10-CM | POA: Diagnosis not present

## 2019-02-27 DIAGNOSIS — J301 Allergic rhinitis due to pollen: Secondary | ICD-10-CM | POA: Diagnosis not present

## 2019-02-28 ENCOUNTER — Encounter: Payer: Self-pay | Admitting: Gastroenterology

## 2019-03-06 ENCOUNTER — Other Ambulatory Visit: Payer: Self-pay | Admitting: Gastroenterology

## 2019-03-06 DIAGNOSIS — Z03818 Encounter for observation for suspected exposure to other biological agents ruled out: Secondary | ICD-10-CM | POA: Diagnosis not present

## 2019-03-07 LAB — SARS CORONAVIRUS 2 (TAT 6-24 HRS): SARS Coronavirus 2: NEGATIVE

## 2019-03-08 ENCOUNTER — Other Ambulatory Visit: Payer: Self-pay

## 2019-03-08 ENCOUNTER — Ambulatory Visit (AMBULATORY_SURGERY_CENTER): Payer: PPO | Admitting: Gastroenterology

## 2019-03-08 ENCOUNTER — Encounter: Payer: Self-pay | Admitting: Gastroenterology

## 2019-03-08 VITALS — BP 108/69 | HR 68 | Temp 97.6°F | Resp 18 | Ht 62.0 in | Wt 135.0 lb

## 2019-03-08 DIAGNOSIS — K3189 Other diseases of stomach and duodenum: Secondary | ICD-10-CM

## 2019-03-08 DIAGNOSIS — Z1211 Encounter for screening for malignant neoplasm of colon: Secondary | ICD-10-CM | POA: Diagnosis not present

## 2019-03-08 DIAGNOSIS — K2270 Barrett's esophagus without dysplasia: Secondary | ICD-10-CM | POA: Diagnosis not present

## 2019-03-08 DIAGNOSIS — K219 Gastro-esophageal reflux disease without esophagitis: Secondary | ICD-10-CM | POA: Diagnosis not present

## 2019-03-08 DIAGNOSIS — I10 Essential (primary) hypertension: Secondary | ICD-10-CM | POA: Diagnosis not present

## 2019-03-08 MED ORDER — SODIUM CHLORIDE 0.9 % IV SOLN: 500.0000 mL | INTRAVENOUS | Status: DC

## 2019-03-08 MED ORDER — HYDROCORTISONE (PERIANAL) 2.5 % EX CREA: 1.0000 "application " | TOPICAL_CREAM | Freq: Two times a day (BID) | CUTANEOUS | 1 refills | Status: DC

## 2019-03-08 NOTE — Patient Instructions (Signed)
Please read handouts provided. Continue present medications. Apply Anusol-HC cream 2 times daily as needed.      YOU HAD AN ENDOSCOPIC PROCEDURE TODAY AT [facility]:   Refer to the procedure report that was given to you for any specific questions about what was found during the examination.  If the procedure report does not answer your questions, please call your gastroenterologist to clarify.  If you requested that your care partner not be given the details of your procedure findings, then the procedure report has been included in a sealed envelope for you to review at your convenience later.  YOU SHOULD EXPECT: Some feelings of bloating in the abdomen. Passage of more gas than usual.  Walking can help get rid of the air that was put into your GI tract during the procedure and reduce the bloating. If you had a lower endoscopy (such as a colonoscopy or flexible sigmoidoscopy) you may notice spotting of blood in your stool or on the toilet paper. If you underwent a bowel prep for your procedure, you may not have a normal bowel movement for a few days.  Please Note:  You might notice some irritation and congestion in your nose or some drainage.  This is from the oxygen used during your procedure.  There is no need for concern and it should clear up in a day or so.  SYMPTOMS TO REPORT IMMEDIATELY:   Following lower endoscopy (colonoscopy or flexible sigmoidoscopy):  Excessive amounts of blood in the stool  Significant tenderness or worsening of abdominal pains  Swelling of the abdomen that is new, acute  Fever of 100F or higher   Following upper endoscopy (EGD)  Vomiting of blood or coffee ground material  New chest pain or pain under the shoulder blades  Painful or persistently difficult swallowing  New shortness of breath  Fever of 100F or higher  Black, tarry-looking stools  For urgent or emergent issues, a gastroenterologist can be reached at any hour by calling  [phone].   DIET:  We do recommend a small meal at first, but then you may proceed to your regular diet.  Drink plenty of fluids but you should avoid alcoholic beverages for 24 hours.  ACTIVITY:  You should plan to take it easy for the rest of today and you should NOT DRIVE or use heavy machinery until tomorrow (because of the sedation medicines used during the test).    FOLLOW UP: Our staff will call the number listed on your records 48-72 hours following your procedure to check on you and address any questions or concerns that you may have regarding the information given to you following your procedure. If we do not reach you, we will leave a message.  We will attempt to reach you two times.  During this call, we will ask if you have developed any symptoms of COVID 19. If you develop any symptoms (ie: fever, flu-like symptoms, shortness of breath, cough etc.) before then, please call [phone].  If you test positive for Covid 19 in the 2 weeks post procedure, please call and report this information to [place].    If any biopsies were taken you will be contacted by phone or by letter within the next 1-3 weeks.  Please call us at [phone] if you have not heard about the biopsies in 3 weeks.    SIGNATURES/CONFIDENTIALITY: You and/or your care partner have signed paperwork which will be entered into your electronic medical record.  These  signatures attest to the fact that that the information above on your After Visit Summary has been reviewed and is understood.  Full responsibility of the confidentiality of this discharge information lies with you and/or your care-partner. 

## 2019-03-08 NOTE — Progress Notes (Signed)
Report to PACU, RN, vss, BBS= Clear.  

## 2019-03-08 NOTE — Progress Notes (Signed)
Temp LC v/s CW 

## 2019-03-08 NOTE — Op Note (Signed)
[facility] Patient Name: Sheri Andrews Procedure Date: 03/08/2019 1:28 PM MRN: [id number] Endoscopist: Mauri Pole , MD Age: 68 Referring MD:  Date of Birth: [birth year] Gender: Female Account #: [id number] Procedure:                Colonoscopy Indications:              Screening for colorectal malignant neoplasm Medicines:                Monitored Anesthesia Care Procedure:                Pre-Anesthesia Assessment:                           - Prior to the procedure, a History and Physical                            was performed, and patient medications and                            allergies were reviewed. The patient's tolerance of                            previous anesthesia was also reviewed. The risks                            and benefits of the procedure and the sedation                            options and risks were discussed with the patient.                            All questions were answered, and informed consent                            was obtained. Prior Anticoagulants: The patient has                            taken no previous anticoagulant or antiplatelet                            agents. ASA Grade Assessment: II - A patient with                            mild systemic disease. After reviewing the risks                            and benefits, the patient was deemed in                            satisfactory condition to undergo the procedure.                           After obtaining informed consent, the colonoscope  was passed under direct vision. Throughout the                            procedure, the patient's blood pressure, pulse, and                            oxygen saturations were monitored continuously. The                            Colonoscope was introduced through the anus and                            advanced to the the cecum, identified by                            appendiceal orifice  and ileocecal valve. The                            colonoscopy was performed without difficulty. The                            patient tolerated the procedure well. The quality                            of the bowel preparation was excellent. The                            ileocecal valve, appendiceal orifice, and rectum                            were photographed. Scope In: 1:48:56 PM Scope Out: 2:02:38 PM Scope Withdrawal Time: 0 hours 10 minutes 7 seconds  Total Procedure Duration: 0 hours 13 minutes 42 seconds  Findings:                 The perianal and digital rectal examinations were                            normal.                           External and internal hemorrhoids were found during                            retroflexion. The hemorrhoids were medium-sized.                           The exam was otherwise without abnormality. Complications:            No immediate complications. Estimated Blood Loss:     Estimated blood loss: none. Impression:               - External and internal hemorrhoids.                           - The examination was otherwise normal.                           -  No specimens collected. Recommendation:           - Patient has a contact number available for                            emergencies. The signs and symptoms of potential                            delayed complications were discussed with the                            patient. Return to normal activities tomorrow.                            Written discharge instructions were provided to the                            patient.                           - Resume previous diet.                           - Continue present medications.                           - Repeat colonoscopy in 10 years for screening                            purposes. Mauri Pole, MD 03/08/2019 2:30:54 PM This report has been signed electronically.

## 2019-03-08 NOTE — Op Note (Signed)
[facility] Patient Name: Sheri Andrews Procedure Date: 03/08/2019 1:28 PM MRN: [id number] Endoscopist: Mauri Pole , MD Age: 68 Referring MD:  Date of Birth: [birth year] Gender: Female Account #: [id number] Procedure:                Upper GI endoscopy Indications:              Suspected upper gastrointestinal bleeding Medicines:                Monitored Anesthesia Care Procedure:                Pre-Anesthesia Assessment:                           - Prior to the procedure, a History and Physical                            was performed, and patient medications and                            allergies were reviewed. The patient's tolerance of                            previous anesthesia was also reviewed. The risks                            and benefits of the procedure and the sedation                            options and risks were discussed with the patient.                            All questions were answered, and informed consent                            was obtained. Prior Anticoagulants: The patient has                            taken no previous anticoagulant or antiplatelet                            agents. ASA Grade Assessment: II - A patient with                            mild systemic disease. After reviewing the risks                            and benefits, the patient was deemed in                            satisfactory condition to undergo the procedure.                           After obtaining informed consent, the endoscope was  passed under direct vision. Throughout the                            procedure, the patient's blood pressure, pulse, and                            oxygen saturations were monitored continuously. The                            Endoscope was introduced through the mouth, and                            advanced to the second part of duodenum. The upper                            GI  endoscopy was accomplished without difficulty.                            The patient tolerated the procedure well. Scope In: Scope Out: Findings:                 The esophagus was normal.                           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                           The examined duodenum was normal. Complications:            No immediate complications. Estimated Blood Loss:     Estimated blood loss was minimal. Impression:               - Normal esophagus.                           - Gastric antral vascular ectasia without bleeding.                           - Normal examined duodenum.                           - No specimens collected. Recommendation:           - Patient has a contact number available for                            emergencies. The signs and symptoms of potential                            delayed complications were discussed with the                            patient. Return to normal activities tomorrow.  Written discharge instructions were provided to the                            patient.                           - Resume previous diet.                           - Continue present medications.                           - Repeat upper endoscopy at the next available                            appointment at [facility] for treatment of GAVE with                            APC. Mauri Pole, MD 03/08/2019 2:28:54 PM This report has been signed electronically.

## 2019-03-10 ENCOUNTER — Telehealth: Payer: Self-pay | Admitting: *Deleted

## 2019-03-10 NOTE — Telephone Encounter (Signed)
  Follow up Call-  Call back number 03/08/2019  Post procedure Call Back phone  # [phone]  Permission to leave phone message Yes  Some recent data might be hidden     Patient questions:  Do you have a fever, pain , or abdominal swelling? No. Pain Score  0 *  Have you tolerated food without any problems? Yes.    Have you been able to return to your normal activities? Yes.    Do you have any questions about your discharge instructions: Diet   No. Medications  No. Follow up visit  No.  Do you have questions or concerns about your Care? No.  Actions: * If pain score is 4 or above: No action needed, pain <4.  1. Have you developed a fever since your procedure? no  2.   Have you had an respiratory symptoms (SOB or cough) since your procedure? no  3.   Have you tested positive for COVID 19 since your procedure no  4.   Have you had any family members/close contacts diagnosed with the COVID 19 since your procedure?  no   If yes to any of these questions please route to Joylene John, RN and Alphonsa Gin, Therapist, sports.

## 2019-03-10 NOTE — Telephone Encounter (Signed)
First follow up call attempt.  Message left on voicemail. 

## 2019-03-15 ENCOUNTER — Other Ambulatory Visit: Payer: Self-pay

## 2019-03-22 ENCOUNTER — Other Ambulatory Visit: Payer: Self-pay

## 2019-03-22 ENCOUNTER — Telehealth: Payer: Self-pay

## 2019-03-22 ENCOUNTER — Encounter: Payer: PPO | Admitting: Gastroenterology

## 2019-03-22 DIAGNOSIS — K31819 Angiodysplasia of stomach and duodenum without bleeding: Secondary | ICD-10-CM

## 2019-03-22 NOTE — Telephone Encounter (Signed)
Spoke with the patient. Scheduled for the EGD at [facility] to treat the [place] with APC.  She will go for the COVID testing on 04/28/2019 at 2pm. Her EGD will be on 05/02/2019 at 8:30 am. Reviewed her instructions. Written instructions mailed to her.

## 2019-03-28 DIAGNOSIS — H2511 Age-related nuclear cataract, right eye: Secondary | ICD-10-CM | POA: Diagnosis not present

## 2019-03-28 DIAGNOSIS — H35372 Puckering of macula, left eye: Secondary | ICD-10-CM | POA: Diagnosis not present

## 2019-03-28 DIAGNOSIS — H2512 Age-related nuclear cataract, left eye: Secondary | ICD-10-CM | POA: Diagnosis not present

## 2019-03-28 DIAGNOSIS — H35371 Puckering of macula, right eye: Secondary | ICD-10-CM | POA: Diagnosis not present

## 2019-03-29 DIAGNOSIS — J3089 Other allergic rhinitis: Secondary | ICD-10-CM | POA: Diagnosis not present

## 2019-03-29 DIAGNOSIS — J301 Allergic rhinitis due to pollen: Secondary | ICD-10-CM | POA: Diagnosis not present

## 2019-04-25 DIAGNOSIS — J301 Allergic rhinitis due to pollen: Secondary | ICD-10-CM | POA: Diagnosis not present

## 2019-04-25 DIAGNOSIS — J3089 Other allergic rhinitis: Secondary | ICD-10-CM | POA: Diagnosis not present

## 2019-04-25 NOTE — Progress Notes (Signed)
Pre-op endo call completed  

## 2019-04-28 ENCOUNTER — Other Ambulatory Visit (HOSPITAL_COMMUNITY)
Admission: RE | Admit: 2019-04-28 | Discharge: 2019-04-28 | Disposition: A | Discharge status: Home / Self Care | Payer: PPO | Source: Ambulatory Visit | Attending: Gastroenterology | Admitting: Gastroenterology

## 2019-04-28 DIAGNOSIS — Z20822 Contact with and (suspected) exposure to covid-19: Secondary | ICD-10-CM | POA: Insufficient documentation

## 2019-04-28 DIAGNOSIS — Z01812 Encounter for preprocedural laboratory examination: Secondary | ICD-10-CM | POA: Diagnosis not present

## 2019-04-29 LAB — NOVEL CORONAVIRUS, NAA (HOSP ORDER, SEND-OUT TO REF LAB; TAT 18-24 HRS): SARS-CoV-2, NAA: NOT DETECTED

## 2019-05-02 ENCOUNTER — Ambulatory Visit (HOSPITAL_COMMUNITY): Payer: PPO | Admitting: Anesthesiology

## 2019-05-02 ENCOUNTER — Ambulatory Visit (HOSPITAL_COMMUNITY)
Admission: RE | Admit: 2019-05-02 | Discharge: 2019-05-02 | Disposition: A | Discharge status: Home / Self Care | Payer: PPO | Attending: Gastroenterology | Admitting: Gastroenterology

## 2019-05-02 ENCOUNTER — Encounter (HOSPITAL_COMMUNITY)
Admission: RE | Disposition: A | Discharge status: Home / Self Care | Payer: Self-pay | Source: Home / Self Care | Attending: Gastroenterology

## 2019-05-02 ENCOUNTER — Other Ambulatory Visit: Payer: Self-pay

## 2019-05-02 ENCOUNTER — Encounter (HOSPITAL_COMMUNITY): Payer: Self-pay | Admitting: Gastroenterology

## 2019-05-02 DIAGNOSIS — E7800 Pure hypercholesterolemia, unspecified: Secondary | ICD-10-CM | POA: Diagnosis not present

## 2019-05-02 DIAGNOSIS — E785 Hyperlipidemia, unspecified: Secondary | ICD-10-CM | POA: Insufficient documentation

## 2019-05-02 DIAGNOSIS — I10 Essential (primary) hypertension: Secondary | ICD-10-CM | POA: Insufficient documentation

## 2019-05-02 DIAGNOSIS — Z7951 Long term (current) use of inhaled steroids: Secondary | ICD-10-CM | POA: Diagnosis not present

## 2019-05-02 DIAGNOSIS — J45909 Unspecified asthma, uncomplicated: Secondary | ICD-10-CM | POA: Diagnosis not present

## 2019-05-02 DIAGNOSIS — F329 Major depressive disorder, single episode, unspecified: Secondary | ICD-10-CM | POA: Diagnosis not present

## 2019-05-02 DIAGNOSIS — Z79890 Hormone replacement therapy (postmenopausal): Secondary | ICD-10-CM | POA: Diagnosis not present

## 2019-05-02 DIAGNOSIS — K219 Gastro-esophageal reflux disease without esophagitis: Secondary | ICD-10-CM | POA: Diagnosis not present

## 2019-05-02 DIAGNOSIS — F419 Anxiety disorder, unspecified: Secondary | ICD-10-CM | POA: Diagnosis not present

## 2019-05-02 DIAGNOSIS — K31819 Angiodysplasia of stomach and duodenum without bleeding: Secondary | ICD-10-CM | POA: Insufficient documentation

## 2019-05-02 DIAGNOSIS — K922 Gastrointestinal hemorrhage, unspecified: Secondary | ICD-10-CM

## 2019-05-02 DIAGNOSIS — Z79899 Other long term (current) drug therapy: Secondary | ICD-10-CM | POA: Diagnosis not present

## 2019-05-02 DIAGNOSIS — D500 Iron deficiency anemia secondary to blood loss (chronic): Secondary | ICD-10-CM

## 2019-05-02 DIAGNOSIS — K921 Melena: Secondary | ICD-10-CM

## 2019-05-02 HISTORY — PX: ESOPHAGOGASTRODUODENOSCOPY (EGD) WITH PROPOFOL: SHX5813

## 2019-05-02 SURGERY — ESOPHAGOGASTRODUODENOSCOPY (EGD) WITH PROPOFOL
Anesthesia: Monitor Anesthesia Care

## 2019-05-02 MED ORDER — PROPOFOL 500 MG/50ML IV EMUL
INTRAVENOUS | Status: DC | PRN
  Administered 2019-05-02: 125 ug/kg/min via INTRAVENOUS

## 2019-05-02 MED ORDER — PROPOFOL 10 MG/ML IV BOLUS
INTRAVENOUS | Status: AC
  Filled 2019-05-02: qty 20

## 2019-05-02 MED ORDER — PROPOFOL 500 MG/50ML IV EMUL
INTRAVENOUS | Status: AC
  Filled 2019-05-02: qty 50

## 2019-05-02 MED ORDER — SODIUM CHLORIDE 0.9 % IV SOLN: INTRAVENOUS | Status: DC

## 2019-05-02 MED ORDER — DIPHENHYDRAMINE HCL 50 MG/ML IJ SOLN
INTRAMUSCULAR | Status: DC | PRN
  Administered 2019-05-02: 25 mg via INTRAVENOUS

## 2019-05-02 MED ORDER — PROPOFOL 10 MG/ML IV BOLUS
INTRAVENOUS | Status: DC | PRN
  Administered 2019-05-02 (×2): 30 mg via INTRAVENOUS

## 2019-05-02 MED ORDER — LIDOCAINE HCL 1 % IJ SOLN
INTRAMUSCULAR | Status: DC | PRN
  Administered 2019-05-02: 100 mg via INTRADERMAL

## 2019-05-02 MED ORDER — LACTATED RINGERS IV SOLN
INTRAVENOUS | Status: DC
  Administered 2019-05-02: 1000 mL via INTRAVENOUS

## 2019-05-02 SURGICAL SUPPLY — 15 items

## 2019-05-02 NOTE — Discharge Instructions (Signed)
YOU HAD AN ENDOSCOPIC PROCEDURE TODAY: Refer to the procedure report and other information in the discharge instructions given to you for any specific questions about what was found during the examination. If this information does not answer your questions, please call [facility] office at [phone] to clarify.   YOU SHOULD EXPECT: Some feelings of bloating in the abdomen. Passage of more gas than usual. Walking can help get rid of the air that was put into your GI tract during the procedure and reduce the bloating. If you had a lower endoscopy (such as a colonoscopy or flexible sigmoidoscopy) you may notice spotting of blood in your stool or on the toilet paper. Some abdominal soreness may be present for a day or two, also.  DIET: Your first meal following the procedure should be a light meal and then it is ok to progress to your normal diet. A half-sandwich or bowl of soup is an example of a good first meal. Heavy or fried foods are harder to digest and may make you feel nauseous or bloated. Drink plenty of fluids but you should avoid alcoholic beverages for 24 hours. If you had a esophageal dilation, please see attached instructions for diet.    ACTIVITY: Your care partner should take you home directly after the procedure. You should plan to take it easy, moving slowly for the rest of the day. You can resume normal activity the day after the procedure however YOU SHOULD NOT DRIVE, use power tools, machinery or perform tasks that involve climbing or major physical exertion for 24 hours (because of the sedation medicines used during the test).   SYMPTOMS TO REPORT IMMEDIATELY: A gastroenterologist can be reached at any hour. Please call [phone]  for any of the following symptoms:   Following upper endoscopy (EGD, EUS, ERCP, esophageal dilation) Vomiting of blood or coffee ground material  New, significant abdominal pain  New, significant chest pain or pain under the shoulder blades  Painful or  persistently difficult swallowing  New shortness of breath  Black, tarry-looking or red, bloody stools  FOLLOW UP:  If any biopsies were taken you will be contacted by phone or by letter within the next 1-3 weeks. Call [phone]  if you have not heard about the biopsies in 3 weeks.  Please also call with any specific questions about appointments or follow up tests.  

## 2019-05-02 NOTE — Op Note (Signed)
[facility] Patient Name: Sheri Andrews Procedure Date: 05/02/2019 MRN: [id number] Attending MD: Mauri Pole , MD Date of Birth: 1950/11/21 CSN: [id number] Age: 69 Admit Type: Outpatient Procedure:                Upper GI endoscopy Indications:              Recent gastrointestinal bleeding Providers:                Mauri Pole, MD, Cleda Daub, RN, Elspeth Cho Tech., Technician, Karis Juba, CRNA Referring MD:              Medicines:                Monitored Anesthesia Care Complications:            No immediate complications. Estimated Blood Loss:     Estimated blood loss was minimal. Procedure:                Pre-Anesthesia Assessment:                           - Prior to the procedure, a History and Physical                            was performed, and patient medications and                            allergies were reviewed. The patient's tolerance of                            previous anesthesia was also reviewed. The risks                            and benefits of the procedure and the sedation                            options and risks were discussed with the patient.                            All questions were answered, and informed consent                            was obtained. Prior Anticoagulants: The patient has                            taken no previous anticoagulant or antiplatelet                            agents. ASA Grade Assessment: II - A patient with                            mild systemic disease. After reviewing the risks  and benefits, the patient was deemed in                            satisfactory condition to undergo the procedure.                           After obtaining informed consent, the endoscope was                            passed under direct vision. Throughout the                            procedure, the patient's blood pressure, pulse,  and                            oxygen saturations were monitored continuously. The                            [id number] Olympus gastroscope was                            introduced through the mouth, and advanced to the                            second part of duodenum. The upper GI endoscopy was                            accomplished without difficulty. The patient                            tolerated the procedure well. Scope In: Scope Out: Findings:      The esophagus was normal.      Two less than 5 mm angiodysplastic lesions with no bleeding were found       in the prepyloric region of the stomach. Coagulation for destruction of       remaining portion of lesion using argon plasma was successful.      The cardia and gastric fundus were normal on retroflexion.      The examined duodenum was normal. Impression:               - Normal esophagus.                           - Two non-bleeding angiodysplastic lesions in the                            stomach. Treated with argon plasma coagulation                            (APC).                           - Normal examined duodenum.                           - No specimens collected. Moderate  Sedation:      Not Applicable - Patient had care per Anesthesia. Recommendation:           - Patient has a contact number available for                            emergencies. The signs and symptoms of potential                            delayed complications were discussed with the                            patient. Return to normal activities tomorrow.                            Written discharge instructions were provided to the                            patient.                           - Mechanical soft diet for 3 days, resume previous                            diet after 3 days.                           - Continue present medications.                           - Repeat H&H in 2 weeks and return to my office in                             3-6 months.                           - Please call with any recurrent episode of melena                            or change in bowel habits Procedure Code(s):        --- Professional ---                           [phone], Esophagogastroduodenoscopy, flexible,                            transoral; with ablation of tumor(s), polyp(s), or                            other lesion(s) (includes pre- and post-dilation                            and guide wire passage, when performed) Diagnosis Code(s):        --- Professional ---  K31.819, Angiodysplasia of stomach and duodenum                            without bleeding                           K92.2, Gastrointestinal hemorrhage, unspecified CPT copyright 2019 American Medical Association. All rights reserved. The codes documented in this report are preliminary and upon coder review may  be revised to meet current compliance requirements. Mauri Pole, MD 05/02/2019 9:05:08 AM This report has been signed electronically. Number of Addenda: 0

## 2019-05-02 NOTE — Transfer of Care (Signed)
Immediate Anesthesia Transfer of Care Note  Patient: Sheri Andrews  Procedure(s) Performed: ESOPHAGOGASTRODUODENOSCOPY (EGD) WITH PROPOFOL (N/A )  Patient Location: PACU and Endoscopy Unit  Anesthesia Type:MAC  Level of Consciousness: drowsy and patient cooperative  Airway & Oxygen Therapy: Patient Spontanous Breathing and Patient connected to face mask oxygen  Post-op Assessment: Report given to RN and Post -op Vital signs reviewed and stable  Post vital signs: Reviewed and stable  Last Vitals:  Vitals Value Taken Time  BP    Temp    Pulse    Resp    SpO2      Last Pain:  Vitals:   05/02/19 0757  TempSrc: Oral  PainSc: 0-No pain         Complications: No apparent anesthesia complications

## 2019-05-02 NOTE — Anesthesia Postprocedure Evaluation (Signed)
Anesthesia Post Note  Patient: Sheri Andrews  Procedure(s) Performed: ESOPHAGOGASTRODUODENOSCOPY (EGD) WITH PROPOFOL (N/A )     Patient location during evaluation: PACU Anesthesia Type: MAC Level of consciousness: awake and alert Pain management: pain level controlled Vital Signs Assessment: post-procedure vital signs reviewed and stable Respiratory status: spontaneous breathing, nonlabored ventilation and respiratory function stable Cardiovascular status: blood pressure returned to baseline and stable Postop Assessment: no apparent nausea or vomiting Anesthetic complications: no Comments: Developed rash with procedure, resolved with benadryl    Last Vitals:  Vitals:   05/02/19 0920 05/02/19 0930  BP: 132/71 130/64  Pulse: 77 77  Resp: 15 17  Temp:    SpO2: 97% 97%    Last Pain:  Vitals:   05/02/19 0930  TempSrc:   PainSc: 0-No pain                 Pervis Hocking

## 2019-05-02 NOTE — H&P (Signed)
[facility] History and Physical   Primary Care Physician:  [name], MD   Reason for Procedure:  GAVE, iron def anemia  Plan:    EGD with APC     HPI: Sheri Andrews is a 69 y.o. 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     Past Medical History:  Diagnosis Date  . Acid reflux disease   . Allergic rhinitis   . Anxiety   . Asthma   . Barrett esophagus   . Dyspnea   . Dysthymia   . Hypercholesterolemia   . Hypertension   . Irritable bowel syndrome   . Nephrolithiasis   . Osteoporosis   . Other chest pain   . Status post dilation of esophageal narrowing     Past Surgical History:  Procedure Laterality Date  . FACIAL COSMETIC SURGERY    . 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  . TUBAL LIGATION  1986    Prior to Admission medications   Medication Sig Start Date End Date Taking? Authorizing Provider  amLODipine (NORVASC) 10 MG tablet TAKE 1 TABLET(10 MG) BY MOUTH DAILY Patient taking differently: Take 10 mg by mouth at bedtime.  09/05/18  Yes [name], MD  Artificial Tear Solution (TEARS NATURALE OP) Place 1 drop into both eyes See admin instructions. Use 1 drop into both eyes (scheduled) in the morning & may use 3 times daily if needed for dry /irritated eyes.   Yes [provider]  azelastine (OPTIVAR) 0.05 % ophthalmic solution Place 1 drop into both eyes daily as needed for allergies. 11/04/18  Yes [provider]  budesonide-formoterol (SYMBICORT) 160-4.5 MCG/ACT inhaler Inhale 2 puffs into the lungs 2 (two) times daily.   Yes [provider]  cholecalciferol (VITAMIN D) 25 MCG (1000 UT) tablet Take 1,000 Units by mouth at bedtime.   Yes [provider]  clonazePAM (KLONOPIN) 0.25 MG disintegrating tablet DISSOLVE ONE TABLET BY MOUTH EVERY DAY AS NEEDED Patient taking differently: Take 0.25 mg by mouth daily as needed (anxiety  (with appointments)).  05/14/16  Yes [name], MD  estrogen, conjugated,-medroxyprogesterone (PREMPRO) 0.45-1.5 MG per tablet Take 1 tablet by mouth at bedtime.    Yes [provider]  FLUoxetine (PROZAC) 20 MG tablet Take 2 tablets (40 mg total) by mouth daily. Patient taking differently: Take 40 mg by mouth at bedtime.  09/05/18  Yes [name], MD  fluticasone [facility]) 50 MCG/ACT nasal spray Place 2 sprays into both nostrils daily. 12/17/18  Yes [provider]  montelukast (SINGULAIR) 10 MG tablet Take 1 tablet (10 mg total) by mouth at bedtime. Patient taking differently: Take 10 mg by mouth at bedtime as needed (allergies.).  05/25/18  Yes Burns, Claudina Lick, MD  Olopatadine HCl (PAZEO) 0.7 % SOLN Place 1 drop into both eyes daily as needed (allergies.).    Yes [provider]  pantoprazole (PROTONIX) 40 MG tablet TAKE 1 TABLET(40 MG) BY MOUTH TWICE DAILY Patient taking differently: Take 40 mg by mouth daily before supper.  09/05/18  Yes [name], MD  Peppermint Oil (IBGARD PO) Take 1 tablet by mouth daily.    Yes [provider]  polyethylene glycol (MIRALAX / GLYCOLAX) 17 g packet Take 17 g by mouth daily as needed for mild constipation.    Yes [provider]  Probiotic Product (PROBIOTIC COLON SUPPORT PO) Take 1 capsule  by mouth at bedtime.    Yes [provider]  simvastatin (ZOCOR) 40 MG tablet TAKE 1 TABLET(40 MG) BY MOUTH DAILY AT 6 PM Patient taking differently: Take 40 mg by mouth at bedtime. TAKE 1 TABLET(40 MG) BY MOUTH DAILY AT 6 PM 09/05/18  Yes [name], MD  ciclopirox [facility]) 8 % solution Apply topically at bedtime. Apply over nail and surrounding skin. Apply daily over previous coat. After seven (7) days, may remove with alcohol and continue cycle. Patient taking differently: Apply 1 application topically daily as needed (nail fungus.). Apply over nail and surrounding skin. Apply  daily over previous coat. After seven (7) days, may remove with alcohol and continue cycle. 11/25/18   [name], MD  EPIPEN 2-PAK 0.3 MG/0.3ML SOAJ injection Inject 0.3 mg into the muscle as needed for anaphylaxis.  02/25/14   [provider]  famotidine (PEPCID) 40 MG tablet Take 1 tablet (40 mg total) by mouth daily. Patient not taking: Reported on 04/25/2019 05/25/18   Binnie Rail, MD  hydrocortisone (ANUSOL-HC) 2.5 % rectal cream Place 1 application rectally 2 (two) times daily. Apply 2 times daily as needed. Patient not taking: Reported on 04/25/2019 03/08/19   Mauri Pole, MD  SUPREP BOWEL PREP KIT 17.5-3.13-1.6 GM/177ML SOLN Take 354 mLs by mouth once. 02/21/19   [provider]    Current Facility-Administered Medications  Medication Dose Route Frequency Provider Last Rate Last Admin  . 0.9 %  sodium chloride infusion   Intravenous Continuous Sarinah Doetsch V, MD      . lactated ringers infusion   Intravenous Continuous Mauri Pole, MD 125 mL/hr at 05/02/19 0813 1,000 mL at 05/02/19 0813    Allergies as of 03/22/2019 - Review Complete 03/08/2019  Allergen Reaction Noted  . Bupropion hcl    . Doxycycline hyclate  02/25/2005  . Erythromycin ethylsuccinate  02/25/2005    Family History  Problem Relation Age of Onset  . Heart disease Father   . Heart disease Mother   . Diabetes Mother   . Diabetes Sister   . Diabetes Brother   . Cancer Maternal Uncle        type unknown  . Throat cancer Paternal Uncle   . Heart attack Sister   . Colon cancer Neg Hx   . Stomach cancer Neg Hx   . Rectal cancer Neg Hx   . Pancreatic cancer Neg Hx     Social History   Socioeconomic History  . Marital status: Married    Spouse name: Not on file  . Number of children: 2  . Years of education: Not on file  . Highest education level: Not on file  Occupational History  . Occupation: retired  Tobacco Use  . Smoking status: Never Smoker  .  Smokeless tobacco: Never Used  Substance and Sexual Activity  . Alcohol use: Yes    Alcohol/week: 3.0 standard drinks    Types: 3 Standard drinks or equivalent per week    Comment: social  . Drug use: No  . Sexual activity: Yes    Partners: Male  Other Topics Concern  . Not on file  Social History Narrative  . Not on file   Social Determinants of Health   Financial Resource Strain:   . Difficulty of Paying Living Expenses: Not on file  Food Insecurity:   . Worried About Charity fundraiser in the Last Year: Not on file  . Ran Out of Food in  the Last Year: Not on file  Transportation Needs:   . Lack of Transportation (Medical): Not on file  . Lack of Transportation (Non-Medical): Not on file  Physical Activity:   . Days of Exercise per Week: Not on file  . Minutes of Exercise per Session: Not on file  Stress:   . Feeling of Stress : Not on file  Social Connections:   . Frequency of Communication with Friends and Family: Not on file  . Frequency of Social Gatherings with Friends and Family: Not on file  . Attends Religious Services: Not on file  . Active Member of Clubs or Organizations: Not on file  . Attends Archivist Meetings: Not on file  . Marital Status: Not on file  Intimate Partner Violence:   . Fear of Current or Ex-Partner: Not on file  . Emotionally Abused: Not on file  . Physically Abused: Not on file  . Sexually Abused: Not on file    Review of Systems:  All other review of systems negative except as mentioned in the HPI.  Physical Exam: Vital signs in last 24 hours: Temp:  [98 F (36.7 C)] 98 F (36.7 C) (01/12 0757) Pulse Rate:  [79] 79 (01/12 0757) Resp:  [18] 18 (01/12 0757) BP: (130)/(70) 130/70 (01/12 0757) SpO2:  [96 %] 96 % (01/12 0757) Weight:  [61.7 kg] 61.7 kg (01/12 0757)   General:   Alert,  Well-developed, well-nourished, pleasant and cooperative in NAD Lungs:  Clear throughout to auscultation.   Heart:  Regular rate and  rhythm; no murmurs, clicks, rubs,  or gallops. Abdomen:  Soft, nontender and nondistended. Normal bowel sounds.   Neuro/Psych:  Alert and cooperative. Normal mood and affect. A and O x 3   K. Denzil Magnuson , MD [phone]

## 2019-05-02 NOTE — Anesthesia Preprocedure Evaluation (Addendum)
Anesthesia Evaluation  Patient identified by MRN, date of birth, ID band Patient awake    Reviewed: Allergy & Precautions, NPO status , Patient's Chart, lab work & pertinent test results  Airway Mallampati: II  TM Distance: >3 FB Neck ROM: Full    Dental no notable dental hx. (+) Teeth Intact, Dental Advisory Given   Pulmonary asthma ,    Pulmonary exam normal breath sounds clear to auscultation       Cardiovascular hypertension, Pt. on medications Normal cardiovascular exam Rhythm:Regular Rate:Normal     Neuro/Psych PSYCHIATRIC DISORDERS Anxiety Depression OCDnegative neurological ROS     GI/Hepatic GERD  Medicated and Controlled,(+)     substance abuse  alcohol use, IBS, barrett's esophagus  Gastric antral vascular ectasia   Endo/Other  negative endocrine ROS  Renal/GU negative Renal ROSHx nephrolithiasis  negative genitourinary   Musculoskeletal negative musculoskeletal ROS (+)   Abdominal Normal abdominal exam  (+)   Peds  Hematology negative hematology ROS (+)   Anesthesia Other Findings HLD  Reproductive/Obstetrics negative OB ROS                            Anesthesia Physical Anesthesia Plan  ASA: II  Anesthesia Plan: MAC   Post-op Pain Management:    Induction:   PONV Risk Score and Plan: 2 and Propofol infusion and TIVA  Airway Management Planned: Natural Airway and Simple Face Mask  Additional Equipment: None  Intra-op Plan:   Post-operative Plan:   Informed Consent: I have reviewed the patients History and Physical, chart, labs and discussed the procedure including the risks, benefits and alternatives for the proposed anesthesia with the patient or authorized representative who has indicated his/her understanding and acceptance.       Plan Discussed with: CRNA  Anesthesia Plan Comments:         Anesthesia Quick Evaluation

## 2019-05-03 ENCOUNTER — Encounter: Payer: Self-pay | Admitting: *Deleted

## 2019-05-19 ENCOUNTER — Ambulatory Visit: Payer: PPO

## 2019-05-19 ENCOUNTER — Telehealth: Payer: Self-pay | Admitting: Internal Medicine

## 2019-05-19 MED ORDER — CLONAZEPAM 0.25 MG PO TBDP: ORAL_TABLET | ORAL | 0 refills | Status: DC

## 2019-05-19 NOTE — Telephone Encounter (Signed)
Patient is requesting script for clonazepam to be sent to [facility] at [facility].   Patient states she would like less qty than she got last time.

## 2019-05-19 NOTE — Telephone Encounter (Signed)
Sent in

## 2019-05-23 ENCOUNTER — Other Ambulatory Visit (INDEPENDENT_AMBULATORY_CARE_PROVIDER_SITE_OTHER): Payer: PPO

## 2019-05-23 ENCOUNTER — Other Ambulatory Visit: Payer: Self-pay

## 2019-05-23 DIAGNOSIS — K31819 Angiodysplasia of stomach and duodenum without bleeding: Secondary | ICD-10-CM | POA: Diagnosis not present

## 2019-05-23 DIAGNOSIS — K922 Gastrointestinal hemorrhage, unspecified: Secondary | ICD-10-CM | POA: Diagnosis not present

## 2019-05-23 LAB — HEMOGLOBIN: Hemoglobin: 13.1 g/dL (ref 12.0–15.0)

## 2019-05-23 LAB — HEMATOCRIT: HCT: 39 % (ref 36.0–46.0)

## 2019-05-23 NOTE — Progress Notes (Signed)
Lab called and requested orders for H & H as mentioned on EGD report from 05-02-19. Labs entered.

## 2019-05-25 ENCOUNTER — Ambulatory Visit: Payer: PPO | Attending: Internal Medicine

## 2019-05-25 DIAGNOSIS — Z23 Encounter for immunization: Secondary | ICD-10-CM | POA: Insufficient documentation

## 2019-05-25 NOTE — Progress Notes (Signed)
   Covid-19 Vaccination Clinic  Name:  Sheri Andrews    MRN: [id number] DOB: [birth year]  05/25/2019  [name] was observed post Covid-19 immunization for 15 minutes without incidence. She was provided with Vaccine Information Sheet and instruction to access the V-Safe system.   [name] was instructed to call 911 with any severe reactions post vaccine: Marland Kitchen Difficulty breathing  . Swelling of your face and throat  . A fast heartbeat  . A bad rash all over your body  . Dizziness and weakness    Immunizations Administered    Name Date Dose VIS Date Route   Pfizer COVID-19 Vaccine 05/25/2019  5:33 PM 0.3 mL 03/31/2019 Intramuscular   Manufacturer: [place]   Lot: [id number]   [place]: [id number]

## 2019-05-30 DIAGNOSIS — J3089 Other allergic rhinitis: Secondary | ICD-10-CM | POA: Diagnosis not present

## 2019-05-30 DIAGNOSIS — J301 Allergic rhinitis due to pollen: Secondary | ICD-10-CM | POA: Diagnosis not present

## 2019-06-01 DIAGNOSIS — J3089 Other allergic rhinitis: Secondary | ICD-10-CM | POA: Diagnosis not present

## 2019-06-01 DIAGNOSIS — J301 Allergic rhinitis due to pollen: Secondary | ICD-10-CM | POA: Diagnosis not present

## 2019-06-14 ENCOUNTER — Ambulatory Visit: Payer: PPO

## 2019-06-15 ENCOUNTER — Ambulatory Visit: Payer: PPO

## 2019-06-19 DIAGNOSIS — H10413 Chronic giant papillary conjunctivitis, bilateral: Secondary | ICD-10-CM | POA: Diagnosis not present

## 2019-06-19 DIAGNOSIS — H25813 Combined forms of age-related cataract, bilateral: Secondary | ICD-10-CM | POA: Diagnosis not present

## 2019-06-19 DIAGNOSIS — H35373 Puckering of macula, bilateral: Secondary | ICD-10-CM | POA: Diagnosis not present

## 2019-06-19 DIAGNOSIS — H16223 Keratoconjunctivitis sicca, not specified as Sjogren's, bilateral: Secondary | ICD-10-CM | POA: Diagnosis not present

## 2019-06-20 ENCOUNTER — Ambulatory Visit: Payer: PPO

## 2019-06-21 ENCOUNTER — Ambulatory Visit: Payer: PPO | Attending: Internal Medicine

## 2019-06-21 DIAGNOSIS — Z23 Encounter for immunization: Secondary | ICD-10-CM | POA: Insufficient documentation

## 2019-06-21 NOTE — Progress Notes (Signed)
   Covid-19 Vaccination Clinic  Name:  JENIN BONTON    MRN: [id number] DOB: Sep 11, 1950  06/21/2019  Ms. Eissler was observed post Covid-19 immunization for 15 minutes without incident. She was provided with Vaccine Information Sheet and instruction to access the V-Safe system.   Ms. Carrera was instructed to call 911 with any severe reactions post vaccine: Marland Kitchen Difficulty breathing  . Swelling of face and throat  . A fast heartbeat  . A bad rash all over body  . Dizziness and weakness   Immunizations Administered    Name Date Dose VIS Date Route   Pfizer COVID-19 Vaccine 06/21/2019 12:33 PM 0.3 mL 03/31/2019 Intramuscular   Manufacturer: [place]   Lot: [id number]   [place]: [id number]

## 2019-06-29 DIAGNOSIS — J301 Allergic rhinitis due to pollen: Secondary | ICD-10-CM | POA: Diagnosis not present

## 2019-06-29 DIAGNOSIS — J3089 Other allergic rhinitis: Secondary | ICD-10-CM | POA: Diagnosis not present

## 2019-07-04 DIAGNOSIS — J3089 Other allergic rhinitis: Secondary | ICD-10-CM | POA: Diagnosis not present

## 2019-07-04 DIAGNOSIS — J301 Allergic rhinitis due to pollen: Secondary | ICD-10-CM | POA: Diagnosis not present

## 2019-07-07 DIAGNOSIS — J301 Allergic rhinitis due to pollen: Secondary | ICD-10-CM | POA: Diagnosis not present

## 2019-07-07 DIAGNOSIS — J3089 Other allergic rhinitis: Secondary | ICD-10-CM | POA: Diagnosis not present

## 2019-07-13 DIAGNOSIS — J3089 Other allergic rhinitis: Secondary | ICD-10-CM | POA: Diagnosis not present

## 2019-07-13 DIAGNOSIS — M8589 Other specified disorders of bone density and structure, multiple sites: Secondary | ICD-10-CM | POA: Diagnosis not present

## 2019-07-13 DIAGNOSIS — J301 Allergic rhinitis due to pollen: Secondary | ICD-10-CM | POA: Diagnosis not present

## 2019-07-18 DIAGNOSIS — J301 Allergic rhinitis due to pollen: Secondary | ICD-10-CM | POA: Diagnosis not present

## 2019-07-18 DIAGNOSIS — J3089 Other allergic rhinitis: Secondary | ICD-10-CM | POA: Diagnosis not present

## 2019-08-08 ENCOUNTER — Encounter (INDEPENDENT_AMBULATORY_CARE_PROVIDER_SITE_OTHER): Payer: PPO | Admitting: Ophthalmology

## 2019-08-22 DIAGNOSIS — J301 Allergic rhinitis due to pollen: Secondary | ICD-10-CM | POA: Diagnosis not present

## 2019-08-22 DIAGNOSIS — J3089 Other allergic rhinitis: Secondary | ICD-10-CM | POA: Diagnosis not present

## 2019-09-06 ENCOUNTER — Encounter (INDEPENDENT_AMBULATORY_CARE_PROVIDER_SITE_OTHER): Payer: PPO | Admitting: Ophthalmology

## 2019-09-27 DIAGNOSIS — J301 Allergic rhinitis due to pollen: Secondary | ICD-10-CM | POA: Diagnosis not present

## 2019-09-27 DIAGNOSIS — J3089 Other allergic rhinitis: Secondary | ICD-10-CM | POA: Diagnosis not present

## 2019-10-13 ENCOUNTER — Other Ambulatory Visit: Payer: Self-pay | Admitting: Internal Medicine

## 2019-10-16 DIAGNOSIS — M79671 Pain in right foot: Secondary | ICD-10-CM | POA: Diagnosis not present

## 2019-10-16 DIAGNOSIS — M25561 Pain in right knee: Secondary | ICD-10-CM | POA: Diagnosis not present

## 2019-10-16 DIAGNOSIS — M25551 Pain in right hip: Secondary | ICD-10-CM | POA: Diagnosis not present

## 2019-10-19 DIAGNOSIS — J3089 Other allergic rhinitis: Secondary | ICD-10-CM | POA: Diagnosis not present

## 2019-10-19 DIAGNOSIS — J301 Allergic rhinitis due to pollen: Secondary | ICD-10-CM | POA: Diagnosis not present

## 2019-10-29 ENCOUNTER — Other Ambulatory Visit: Payer: Self-pay | Admitting: Internal Medicine

## 2019-11-04 ENCOUNTER — Other Ambulatory Visit: Payer: Self-pay | Admitting: Internal Medicine

## 2019-11-06 ENCOUNTER — Other Ambulatory Visit: Payer: Self-pay | Admitting: Internal Medicine

## 2019-11-10 ENCOUNTER — Other Ambulatory Visit: Payer: Self-pay | Admitting: Internal Medicine

## 2019-11-10 ENCOUNTER — Telehealth: Payer: Self-pay

## 2019-11-10 NOTE — Telephone Encounter (Signed)
New message    Patient voiced  Need a Referral to Dr. [name] at [facility] have not seen in awhile.

## 2019-11-13 NOTE — Telephone Encounter (Signed)
Pt needs an OV. Last OV was 11/2018. Please schedule. Thanks!

## 2019-11-13 NOTE — Telephone Encounter (Signed)
F/u  Upcoming appt on 8.4.21

## 2019-11-22 ENCOUNTER — Ambulatory Visit: Payer: PPO | Admitting: Internal Medicine

## 2019-11-22 DIAGNOSIS — J301 Allergic rhinitis due to pollen: Secondary | ICD-10-CM | POA: Diagnosis not present

## 2019-11-22 DIAGNOSIS — J3089 Other allergic rhinitis: Secondary | ICD-10-CM | POA: Diagnosis not present

## 2019-11-24 ENCOUNTER — Other Ambulatory Visit: Payer: Self-pay

## 2019-11-24 ENCOUNTER — Encounter: Payer: Self-pay | Admitting: Internal Medicine

## 2019-11-24 ENCOUNTER — Ambulatory Visit (INDEPENDENT_AMBULATORY_CARE_PROVIDER_SITE_OTHER): Payer: PPO | Admitting: Internal Medicine

## 2019-11-24 VITALS — BP 124/82 | HR 104 | Temp 98.4°F | Ht 63.5 in | Wt 141.0 lb

## 2019-11-24 DIAGNOSIS — E7800 Pure hypercholesterolemia, unspecified: Secondary | ICD-10-CM

## 2019-11-24 DIAGNOSIS — D500 Iron deficiency anemia secondary to blood loss (chronic): Secondary | ICD-10-CM | POA: Diagnosis not present

## 2019-11-24 DIAGNOSIS — J4530 Mild persistent asthma, uncomplicated: Secondary | ICD-10-CM

## 2019-11-24 DIAGNOSIS — I10 Essential (primary) hypertension: Secondary | ICD-10-CM

## 2019-11-24 DIAGNOSIS — E559 Vitamin D deficiency, unspecified: Secondary | ICD-10-CM

## 2019-11-24 DIAGNOSIS — Z0000 Encounter for general adult medical examination without abnormal findings: Secondary | ICD-10-CM

## 2019-11-24 DIAGNOSIS — Z8249 Family history of ischemic heart disease and other diseases of the circulatory system: Secondary | ICD-10-CM

## 2019-11-24 MED ORDER — MONTELUKAST SODIUM 10 MG PO TABS: 10.0000 mg | ORAL_TABLET | Freq: Every day | ORAL | 3 refills | Status: DC

## 2019-11-24 MED ORDER — FLUOXETINE HCL 20 MG PO TABS: 40.0000 mg | ORAL_TABLET | Freq: Every day | ORAL | 3 refills | Status: DC

## 2019-11-24 MED ORDER — PANTOPRAZOLE SODIUM 40 MG PO TBEC: 40.0000 mg | DELAYED_RELEASE_TABLET | Freq: Every day | ORAL | 3 refills | Status: DC

## 2019-11-24 MED ORDER — FAMOTIDINE 40 MG PO TABS: 40.0000 mg | ORAL_TABLET | Freq: Every day | ORAL | 3 refills | Status: DC

## 2019-11-24 MED ORDER — AMLODIPINE BESYLATE 10 MG PO TABS: 10.0000 mg | ORAL_TABLET | Freq: Every day | ORAL | 3 refills | Status: DC

## 2019-11-24 MED ORDER — SIMVASTATIN 40 MG PO TABS: ORAL_TABLET | ORAL | 3 refills | Status: DC

## 2019-11-24 NOTE — Assessment & Plan Note (Signed)
Checking CBC and bleeding clinically appears to have stopped.

## 2019-11-24 NOTE — Progress Notes (Signed)
Subjective:   Patient ID: Sheri Andrews, female    DOB: 08/06/1950, 69 y.o.   MRN: [id number]  HPI Here for medicare wellness and physical, no new complaints. Please see A/P for status and treatment of chronic medical problems.   Diet: heart healthy Physical activity: sedentary Depression/mood screen: negative Hearing: intact to whispered voice, mild loss bilateral Visual acuity: grossly normal, performs annual eye exam  ADLs: capable Fall risk: none Home safety: good Cognitive evaluation: intact to orientation, naming, recall and repetition EOL planning: adv directives discussed    Office Visit from 11/24/2019 in [place] [facility] Total Score 0      I have personally reviewed and have noted 1. The patient's medical and social history - reviewed today no changes 2. Their use of alcohol, tobacco or illicit drugs 3. Their current medications and supplements 4. The patient's functional ability including ADL's, fall risks, home safety risks and hearing or visual impairment. 5. Diet and physical activities 6. Evidence for depression or mood disorders 7. Care team reviewed and updated  Patient Care Team: [name], MD as PCP - General (Internal Medicine) Past Medical History:  Diagnosis Date   Acid reflux disease    Allergic rhinitis    Anxiety    Asthma    Barrett esophagus    Dyspnea    Dysthymia    Hypercholesterolemia    Hypertension    Irritable bowel syndrome    Nephrolithiasis    Osteoporosis    Other chest pain    Status post dilation of esophageal narrowing    Past Surgical History:  Procedure Laterality Date   ESOPHAGOGASTRODUODENOSCOPY (EGD) WITH PROPOFOL N/A 05/02/2019   Procedure: ESOPHAGOGASTRODUODENOSCOPY (EGD) WITH PROPOFOL;  Surgeon: Mauri Pole, MD;  Location: [facility] ENDOSCOPY;  Service: Endoscopy;  Laterality: N/A;  needing APC available   FACIAL COSMETIC SURGERY     Repair artery and nerve in  arm from injury Right [place]   Family History  Problem Relation Age of Onset   Heart disease Father    Heart disease Mother    Diabetes Mother    Diabetes Sister    Diabetes Brother    Cancer Maternal Uncle        type unknown   Throat cancer Paternal Uncle    Heart attack Sister    Colon cancer Neg Hx    Stomach cancer Neg Hx    Rectal cancer Neg Hx    Pancreatic cancer Neg Hx     Review of Systems  Constitutional: Negative.   HENT: Negative.   Eyes: Negative.   Respiratory: Positive for chest tightness. Negative for cough and shortness of breath.   Cardiovascular: Negative for chest pain, palpitations and leg swelling.  Gastrointestinal: Negative for abdominal distention, abdominal pain, constipation, diarrhea, nausea and vomiting.  Musculoskeletal: Negative.   Skin: Negative.   Neurological: Negative.   Psychiatric/Behavioral: Negative.     Objective:  Physical Exam Constitutional:      Appearance: She is well-developed.  HENT:     Head: Normocephalic and atraumatic.  Cardiovascular:     Rate and Rhythm: Normal rate and regular rhythm.  Pulmonary:     Effort: Pulmonary effort is normal. No respiratory distress.     Breath sounds: Normal breath sounds. No wheezing or rales.  Abdominal:     General: Bowel sounds are normal. There is no distension.     Palpations: Abdomen is soft.  Tenderness: There is no abdominal tenderness. There is no rebound.  Musculoskeletal:     Cervical back: Normal range of motion.  Skin:    General: Skin is warm and dry.  Neurological:     Mental Status: She is alert and oriented to person, place, and time.     Coordination: Coordination normal.     Vitals:   11/24/19 1108  BP: 124/82  Pulse: (!) 104  Temp: 98.4 F (36.9 C)  TempSrc: Oral  SpO2: 96%  Weight: 141 lb (64 kg)  Height: 5' 3.5" (1.613 m)   This visit occurred during the SARS-CoV-2 public health emergency.  Safety protocols  were in place, including screening questions prior to the visit, additional usage of staff PPE, and extensive cleaning of exam room while observing appropriate contact time as indicated for disinfecting solutions.   Assessment & Plan:

## 2019-11-24 NOTE — Assessment & Plan Note (Signed)
Flu shot yearly. Covid-19 up to date. Pneumonia complete. Shingrix counseled. Tetanus due 2023. Colonoscopy due 2030. Mammogram due later this year, pap smear aged out and dexa due 2023. Counseled about sun safety and mole surveillance. Counseled about the dangers of distracted driving. Given 10 year screening recommendations.

## 2019-11-24 NOTE — Assessment & Plan Note (Signed)
Checking lipid panel and adjust pravastatin as needed.  

## 2019-11-24 NOTE — Assessment & Plan Note (Signed)
Taking amlodipine and BP at goal. Checking CMP and adjust as needed.

## 2019-11-24 NOTE — Patient Instructions (Addendum)
We will do the labs today.   Health Maintenance, Female Adopting a healthy lifestyle and getting preventive care are important in promoting health and wellness. Ask your health care provider about:  The right schedule for you to have regular tests and exams.  Things you can do on your own to prevent diseases and keep yourself healthy. What should I know about diet, weight, and exercise? Eat a healthy diet   Eat a diet that includes plenty of vegetables, fruits, low-fat dairy products, and lean protein.  Do not eat a lot of foods that are high in solid fats, added sugars, or sodium. Maintain a healthy weight Body mass index (BMI) is used to identify weight problems. It estimates body fat based on height and weight. Your health care provider can help determine your BMI and help you achieve or maintain a healthy weight. Get regular exercise Get regular exercise. This is one of the most important things you can do for your health. Most adults should:  Exercise for at least 150 minutes each week. The exercise should increase your heart rate and make you sweat (moderate-intensity exercise).  Do strengthening exercises at least twice a week. This is in addition to the moderate-intensity exercise.  Spend less time sitting. Even light physical activity can be beneficial. Watch cholesterol and blood lipids Have your blood tested for lipids and cholesterol at 70 years of age, then have this test every 5 years. Have your cholesterol levels checked more often if:  Your lipid or cholesterol levels are high.  You are older than 70 years of age.  You are at high risk for heart disease. What should I know about cancer screening? Depending on your health history and family history, you may need to have cancer screening at various ages. This may include screening for:  Breast cancer.  Cervical cancer.  Colorectal cancer.  Skin cancer.  Lung cancer. What should I know about heart disease,  diabetes, and high blood pressure? Blood pressure and heart disease  High blood pressure causes heart disease and increases the risk of stroke. This is more likely to develop in people who have high blood pressure readings, are of African descent, or are overweight.  Have your blood pressure checked: ? Every 3-5 years if you are 45-28 years of age. ? Every year if you are 62 years old or older. Diabetes Have regular diabetes screenings. This checks your fasting blood sugar level. Have the screening done:  Once every three years after age 48 if you are at a normal weight and have a low risk for diabetes.  More often and at a younger age if you are overweight or have a high risk for diabetes. What should I know about preventing infection? Hepatitis B If you have a higher risk for hepatitis B, you should be screened for this virus. Talk with your health care provider to find out if you are at risk for hepatitis B infection. Hepatitis C Testing is recommended for:  Everyone born from [birth year] through 1966.  Anyone with known risk factors for hepatitis C. Sexually transmitted infections (STIs)  Get screened for STIs, including gonorrhea and chlamydia, if: ? You are sexually active and are younger than 70 years of age. ? You are older than 70 years of age and your health care provider tells you that you are at risk for this type of infection. ? Your sexual activity has changed since you were last screened, and you are at increased risk for chlamydia  or gonorrhea. Ask your health care provider if you are at risk.  Ask your health care provider about whether you are at high risk for HIV. Your health care provider may recommend a prescription medicine to help prevent HIV infection. If you choose to take medicine to prevent HIV, you should first get tested for HIV. You should then be tested every 3 months for as long as you are taking the medicine. Pregnancy  If you are about to stop having your  period (premenopausal) and you may become pregnant, seek counseling before you get pregnant.  Take 400 to 800 micrograms (mcg) of folic acid every day if you become pregnant.  Ask for birth control (contraception) if you want to prevent pregnancy. Osteoporosis and menopause Osteoporosis is a disease in which the bones lose minerals and strength with aging. This can result in bone fractures. If you are 55 years old or older, or if you are at risk for osteoporosis and fractures, ask your health care provider if you should:  Be screened for bone loss.  Take a calcium or vitamin D supplement to lower your risk of fractures.  Be given hormone replacement therapy (HRT) to treat symptoms of menopause. Follow these instructions at home: Lifestyle  Do not use any products that contain nicotine or tobacco, such as cigarettes, e-cigarettes, and chewing tobacco. If you need help quitting, ask your health care provider.  Do not use street drugs.  Do not share needles.  Ask your health care provider for help if you need support or information about quitting drugs. Alcohol use  Do not drink alcohol if: ? Your health care provider tells you not to drink. ? You are pregnant, may be pregnant, or are planning to become pregnant.  If you drink alcohol: ? Limit how much you use to 0-1 drink a day. ? Limit intake if you are breastfeeding.  Be aware of how much alcohol is in your drink. In the [place] one drink equals one 12 oz bottle of beer (355 mL), one 5 oz glass of wine (148 mL), or one 1 oz glass of hard liquor (44 mL). General instructions  Schedule regular health, dental, and eye exams.  Stay current with your vaccines.  Tell your health care provider if: ? You often feel depressed. ? You have ever been abused or do not feel safe at home. Summary  Adopting a healthy lifestyle and getting preventive care are important in promoting health and wellness.  Follow your health care provider's  instructions about healthy diet, exercising, and getting tested or screened for diseases.  Follow your health care provider's instructions on monitoring your cholesterol and blood pressure. This information is not intended to replace advice given to you by your health care provider. Make sure you discuss any questions you have with your health care provider. Document Revised: 03/30/2018 Document Reviewed: 03/30/2018 Elsevier Patient Education  2020 [employer].

## 2019-11-24 NOTE — Assessment & Plan Note (Signed)
Uses albuterol as needed and symbicort when she remembers.

## 2019-11-24 NOTE — Assessment & Plan Note (Signed)
Refer back to cardiology for some intermittent chest tightness as well as strong family history of CAD and 3/4 siblings with heart attacks.

## 2019-11-25 LAB — COMPREHENSIVE METABOLIC PANEL
AG Ratio: 1.8 (calc) (ref 1.0–2.5)
ALT: 32 U/L — ABNORMAL HIGH (ref 6–29)
AST: 33 U/L (ref 10–35)
Albumin: 4.4 g/dL (ref 3.6–5.1)
Alkaline phosphatase (APISO): 38 U/L (ref 37–153)
BUN: 16 mg/dL (ref 7–25)
CO2: 24 mmol/L (ref 20–32)
Calcium: 9.4 mg/dL (ref 8.6–10.4)
Chloride: 106 mmol/L (ref 98–110)
Creat: 0.99 mg/dL (ref 0.50–0.99)
Globulin: 2.5 g/dL (calc) (ref 1.9–3.7)
Glucose, Bld: 86 mg/dL (ref 65–99)
Potassium: 4.1 mmol/L (ref 3.5–5.3)
Sodium: 141 mmol/L (ref 135–146)
Total Bilirubin: 0.4 mg/dL (ref 0.2–1.2)
Total Protein: 6.9 g/dL (ref 6.1–8.1)

## 2019-11-25 LAB — LIPID PANEL
Cholesterol: 182 mg/dL (ref <200)
HDL: 44 mg/dL — ABNORMAL LOW (ref >=50)
LDL Cholesterol (Calc): 109 mg/dL (calc) — ABNORMAL HIGH
Non-HDL Cholesterol (Calc): 138 mg/dL (calc) — ABNORMAL HIGH (ref <130)
Total CHOL/HDL Ratio: 4.1 (calc) (ref <5.0)
Triglycerides: 175 mg/dL — ABNORMAL HIGH (ref <150)

## 2019-11-25 LAB — CBC
HCT: 39.9 % (ref 35.0–45.0)
Hemoglobin: 13.7 g/dL (ref 11.7–15.5)
MCH: 32.4 pg (ref 27.0–33.0)
MCHC: 34.3 g/dL (ref 32.0–36.0)
MCV: 94.3 fL (ref 80.0–100.0)
MPV: 8.8 fL (ref 7.5–12.5)
Platelets: 245 10*3/uL (ref 140–400)
RBC: 4.23 10*6/uL (ref 3.80–5.10)
RDW: 13.3 % (ref 11.0–15.0)
WBC: 4.9 10*3/uL (ref 3.8–10.8)

## 2019-11-25 LAB — TSH: TSH: 1.37 mIU/L (ref 0.40–4.50)

## 2019-11-25 LAB — VITAMIN D 25 HYDROXY (VIT D DEFICIENCY, FRACTURES): Vit D, 25-Hydroxy: 34 ng/mL (ref 30–100)

## 2019-12-01 ENCOUNTER — Telehealth: Payer: Self-pay

## 2019-12-01 MED ORDER — FLUOXETINE HCL 40 MG PO CAPS: 40.0000 mg | ORAL_CAPSULE | Freq: Every day | ORAL | 3 refills | Status: DC

## 2019-12-01 NOTE — Addendum Note (Signed)
Addended by: Pricilla Holm A on: 12/01/2019 10:36 AM   Modules accepted: Orders

## 2019-12-01 NOTE — Telephone Encounter (Signed)
New message   1.Medication Requested:FLUoxetine (PROZAC) 20 MG tablet - asking for a capsule form    2. Pharmacy (Name, [facility] [place] [place] - [address] AT [address] [place]  3. On Med List: Yes   4. Last Visit with PCP: 8.6.21  5. Next visit date with PCP: n/a   Agent: Please be advised that RX refills may take up to 3 business days. We ask that you follow-up with your pharmacy.

## 2019-12-01 NOTE — Telephone Encounter (Signed)
Sent in capsules instead.

## 2019-12-05 NOTE — Telephone Encounter (Signed)
New message:   Pt is calling and states she would like these pills sent in for gel tablets instead of the capsules. Please advise.

## 2019-12-06 ENCOUNTER — Encounter (INDEPENDENT_AMBULATORY_CARE_PROVIDER_SITE_OTHER): Payer: PPO | Admitting: Ophthalmology

## 2019-12-06 MED ORDER — FLUOXETINE HCL 40 MG PO CAPS
40.0000 mg | ORAL_CAPSULE | Freq: Every day | ORAL | 3 refills | Status: DC
Start: 2019-12-06 — End: 2021-01-21

## 2019-12-06 NOTE — Telephone Encounter (Signed)
This rx was redone and gel capsule requested but otherwise unable to help further in this regard

## 2019-12-06 NOTE — Addendum Note (Signed)
Addended by: Biagio Borg on: 12/06/2019 01:05 PM   Modules accepted: Orders

## 2020-01-02 DIAGNOSIS — Z1231 Encounter for screening mammogram for malignant neoplasm of breast: Secondary | ICD-10-CM | POA: Diagnosis not present

## 2020-01-03 ENCOUNTER — Ambulatory Visit (INDEPENDENT_AMBULATORY_CARE_PROVIDER_SITE_OTHER): Payer: PPO | Admitting: Ophthalmology

## 2020-01-03 ENCOUNTER — Encounter (INDEPENDENT_AMBULATORY_CARE_PROVIDER_SITE_OTHER): Payer: Self-pay | Admitting: Ophthalmology

## 2020-01-03 ENCOUNTER — Other Ambulatory Visit: Payer: Self-pay

## 2020-01-03 DIAGNOSIS — H35371 Puckering of macula, right eye: Secondary | ICD-10-CM

## 2020-01-03 DIAGNOSIS — H40032 Anatomical narrow angle, left eye: Secondary | ICD-10-CM | POA: Diagnosis not present

## 2020-01-03 DIAGNOSIS — H35372 Puckering of macula, left eye: Secondary | ICD-10-CM | POA: Insufficient documentation

## 2020-01-03 DIAGNOSIS — H40031 Anatomical narrow angle, right eye: Secondary | ICD-10-CM | POA: Diagnosis not present

## 2020-01-03 DIAGNOSIS — H2511 Age-related nuclear cataract, right eye: Secondary | ICD-10-CM | POA: Diagnosis not present

## 2020-01-03 DIAGNOSIS — J3089 Other allergic rhinitis: Secondary | ICD-10-CM | POA: Diagnosis not present

## 2020-01-03 DIAGNOSIS — H2512 Age-related nuclear cataract, left eye: Secondary | ICD-10-CM

## 2020-01-03 DIAGNOSIS — J301 Allergic rhinitis due to pollen: Secondary | ICD-10-CM | POA: Diagnosis not present

## 2020-01-03 NOTE — Assessment & Plan Note (Signed)

## 2020-01-03 NOTE — Progress Notes (Signed)
01/03/2020     CHIEF COMPLAINT Patient presents for Retina Follow Up   HISTORY OF PRESENT ILLNESS: Sheri Andrews is a 69 y.o. 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  Severity is moderate.  Duration of 9 months.  Since onset it is stable.          Comments    9 Month ERM f\u OU. OCT  Pt states she has a lot of pressure on/around OD. Pt states at night OU feel very tired. Denies any changes in vision.       Last edited by Tilda Franco on 01/03/2020  1:09 PM. (History)      Referring physician: [name], MD [place],  [place] [id number]  HISTORICAL INFORMATION:   Selected notes from the MEDICAL RECORD NUMBER    Lab Results  Component Value Date   HGBA1C 5.5 05/14/2016     CURRENT MEDICATIONS: Current Outpatient Medications (Ophthalmic Drugs)  Medication Sig  . Artificial Tear Solution (TEARS NATURALE OP) Place 1 drop into both eyes See admin instructions. Use 1 drop into both eyes (scheduled) in the morning & may use 3 times daily if needed for dry /irritated eyes.  Marland Kitchen azelastine (OPTIVAR) 0.05 % ophthalmic solution Place 1 drop into both eyes daily as needed for allergies.  . Olopatadine HCl (PAZEO) 0.7 % SOLN Place 1 drop into both eyes daily as needed (allergies.).    No current facility-administered medications for this visit. (Ophthalmic Drugs)   Current Outpatient Medications (Other)  Medication Sig  . amLODipine (NORVASC) 10 MG tablet Take 1 tablet (10 mg total) by mouth daily.  . budesonide-formoterol (SYMBICORT) 160-4.5 MCG/ACT inhaler Inhale 2 puffs into the lungs 2 (two) times daily.  . cholecalciferol (VITAMIN D) 25 MCG (1000 UT) tablet Take 1,000 Units by mouth at bedtime.  . ciclopirox (PENLAC) 8 % solution Apply topically at bedtime. Apply over nail and surrounding skin. Apply daily over previous coat. After seven (7) days, may remove with alcohol and  continue cycle. (Patient taking differently: Apply 1 application topically daily as needed (nail fungus.). Apply over nail and surrounding skin. Apply daily over previous coat. After seven (7) days, may remove with alcohol and continue cycle.)  . clonazePAM (KLONOPIN) 0.25 MG disintegrating tablet DISSOLVE ONE TABLET BY MOUTH EVERY DAY AS NEEDED  . EPIPEN 2-PAK 0.3 MG/0.3ML SOAJ injection Inject 0.3 mg into the muscle as needed for anaphylaxis.   Marland Kitchen estrogen, conjugated,-medroxyprogesterone (PREMPRO) 0.45-1.5 MG per tablet Take 1 tablet by mouth at bedtime.   . famotidine (PEPCID) 40 MG tablet Take 1 tablet (40 mg total) by mouth daily.  Marland Kitchen FLUoxetine (PROZAC) 40 MG capsule Take 1 capsule (40 mg total) by mouth daily.  . fluticasone (FLONASE) 50 MCG/ACT nasal spray Place 2 sprays into both nostrils daily.  . montelukast (SINGULAIR) 10 MG tablet Take 1 tablet (10 mg total) by mouth at bedtime.  . pantoprazole (PROTONIX) 40 MG tablet Take 1 tablet (40 mg total) by mouth daily.  Marland Kitchen Peppermint Oil (IBGARD PO) Take 1 tablet by mouth daily.   . polyethylene glycol (MIRALAX / GLYCOLAX) 17 g packet Take 17 g by mouth daily as needed for mild constipation.   . Probiotic Product (PROBIOTIC COLON SUPPORT PO) Take 1 capsule by mouth at bedtime.   . simvastatin (ZOCOR) 40 MG tablet TAKE 1 TABLET(40 MG) BY  MOUTH DAILY AT 6 PM   No current facility-administered medications for this visit. (Other)      REVIEW OF SYSTEMS:    ALLERGIES Allergies  Allergen Reactions  . Bupropion Hcl Swelling    WELLBUTRIN  . Doxycycline Hyclate Other (See Comments)    upset  stomach  . Erythromycin Ethylsuccinate Other (See Comments)     upset stomach    PAST MEDICAL HISTORY Past Medical History:  Diagnosis Date  . Acid reflux disease   . Allergic rhinitis   . Anxiety   . Asthma   . Barrett esophagus   . Dyspnea   . Dysthymia   . Hypercholesterolemia   . Hypertension   . Irritable bowel syndrome   .  Nephrolithiasis   . Osteoporosis   . Other chest pain   . Status post dilation of esophageal narrowing    Past Surgical History:  Procedure Laterality Date  . ESOPHAGOGASTRODUODENOSCOPY (EGD) WITH PROPOFOL N/A 05/02/2019   Procedure: ESOPHAGOGASTRODUODENOSCOPY (EGD) WITH PROPOFOL;  Surgeon: Mauri Pole, MD;  Location: [facility];  Service: Endoscopy;  Laterality: N/A;  needing APC available  . FACIAL COSMETIC SURGERY    . 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  . TUBAL LIGATION  1986    FAMILY HISTORY Family History  Problem Relation Age of Onset  . Heart disease Father   . Heart disease Mother   . Diabetes Mother   . Diabetes Sister   . Diabetes Brother   . Cancer Maternal Uncle        type unknown  . Throat cancer Paternal Uncle   . Heart attack Sister   . Colon cancer Neg Hx   . Stomach cancer Neg Hx   . Rectal cancer Neg Hx   . Pancreatic cancer Neg Hx     SOCIAL HISTORY Social History   Tobacco Use  . Smoking status: Never Smoker  . Smokeless tobacco: Never Used  Vaping Use  . Vaping Use: Never used  Substance Use Topics  . Alcohol use: Yes    Alcohol/week: 3.0 standard drinks    Types: 3 Standard drinks or equivalent per week    Comment: social  . Drug use: No         OPHTHALMIC EXAM:  Base Eye Exam    Visual Acuity (Snellen - Linear)      Right Left   Dist cc 20/20 -2 20/25 -2   Correction: Glasses       Tonometry (Tonopen, 1:13 PM)      Right Left   Pressure 16 19       Pupils      Pupils Dark Light Shape React APD   Right PERRL 5 4 Round Brisk None   Left PERRL 5 4 Round Brisk None       Visual Fields (Counting fingers)      Left Right    Full Full       Neuro/Psych    Oriented x3: Yes   Mood/Affect: Normal       Dilation    Left eye: 1.0% Mydriacyl, 2.5% Phenylephrine @ 1:15 PM        Slit Lamp and Fundus Exam    External Exam      Right Left   External Normal Normal       Slit Lamp Exam       Right Left   Lids/Lashes Normal Normal   Conjunctiva/Sclera White and quiet White and quiet  Cornea Clear Clear   Anterior Chamber Deep and quiet Deep and quiet   Iris Round and reactive Round and reactive   Anterior Vitreous Normal Normal       Fundus Exam      Right Left   Posterior Vitreous  Normal   Disc  Normal   C/D Ratio  0.3   Macula  Epiretinal membrane, foveal,   Vessels  Normal   Periphery  Normal          IMAGING AND PROCEDURES  Imaging and Procedures for 01/03/20  OCT, Retina - OU - Both Eyes       Right Eye Quality was good. Scan locations included subfoveal. Central Foveal Thickness: 279. Progression has been stable. Findings include epiretinal membrane.   Left Eye Quality was good. Scan locations included subfoveal. Central Foveal Thickness: 331. Progression has been stable. Findings include epiretinal membrane.   Notes None foveal distorting epiretinal membrane superiorly OD, observe  With epiretinal membrane overlying the fovea with pseudo-CME Perifoveal, schisis yet with good acuity okay to observe                ASSESSMENT/PLAN:  Nuclear sclerotic cataract of left eye The nature of cataract was discussed with the patient as well as the elective nature of surgery. The patient was reassured that surgery at a later date does not put the patient at risk for a worse outcome. It was emphasized that the need for surgery is dictated by the patient's quality of life as influenced by the cataract. Patient was instructed to maintain close follow up with their general eye care doctor.      ICD-10-CM   1. Right epiretinal membrane  H35.371 OCT, Retina - OU - Both Eyes  2. Left epiretinal membrane  H35.372 OCT, Retina - OU - Both Eyes  3. Nuclear sclerotic cataract of right eye  H25.11   4. Nuclear sclerotic cataract of left eye  H25.12   5. 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 Anatomical narrow angle of right eye   H40.031     1.  Epiretinal membrane OU, stable over the last 9 months.  Will lengthen the interval examination now to once a year.  2.  Dr. Clent Jacks as scheduled on entering nuclear sclerotic changes OU  3.  Ophthalmic Meds Ordered this visit:  No orders of the defined types were placed in this encounter.      Return in about 1 year (around 01/02/2021) for DILATE OU, OCT.  There are no Patient Instructions on file for this visit.   Explained the diagnoses, plan, and follow up with the patient and they expressed understanding.  Patient expressed understanding of the importance of proper follow up care.   Clent Demark Kaydn Kumpf M.D. Diseases & Surgery of the Retina and Vitreous [facility] [place] 01/03/20     Abbreviations: M myopia (nearsighted); A astigmatism; H hyperopia (farsighted); P presbyopia; Mrx spectacle prescription;  CTL contact lenses; OD right eye; OS left eye; OU both eyes  XT exotropia; ET esotropia; PEK punctate epithelial keratitis; PEE punctate epithelial erosions; DES dry eye syndrome; MGD meibomian gland dysfunction; ATs artificial tears; PFAT's preservative free artificial tears; [place] nuclear sclerotic cataract; PSC posterior subcapsular cataract; ERM epi-retinal membrane; PVD posterior vitreous detachment; RD retinal detachment; DM diabetes mellitus; DR diabetic retinopathy; NPDR non-proliferative diabetic retinopathy; PDR proliferative diabetic retinopathy; CSME clinically significant macular edema; DME diabetic macular edema; dbh dot blot hemorrhages; CWS  cotton wool spot; POAG primary open angle glaucoma; C/D cup-to-disc ratio; HVF humphrey visual field; GVF goldmann visual field; OCT optical coherence tomography; IOP intraocular pressure; BRVO Branch retinal vein occlusion; CRVO central retinal vein occlusion; CRAO central retinal artery occlusion; BRAO branch retinal artery occlusion; RT retinal tear; SB scleral buckle; PPV pars plana vitrectomy; VH  Vitreous hemorrhage; PRP panretinal laser photocoagulation; IVK intravitreal kenalog; VMT vitreomacular traction; MH Macular hole;  NVD neovascularization of the disc; NVE neovascularization elsewhere; AREDS age related eye disease study; ARMD age related macular degeneration; POAG primary open angle glaucoma; EBMD epithelial/anterior basement membrane dystrophy; ACIOL anterior chamber intraocular lens; IOL intraocular lens; PCIOL posterior chamber intraocular lens; Phaco/IOL phacoemulsification with intraocular lens placement; [place] photorefractive keratectomy; LASIK laser assisted in situ keratomileusis; HTN hypertension; DM diabetes mellitus; COPD chronic obstructive pulmonary disease

## 2020-01-08 DIAGNOSIS — J019 Acute sinusitis, unspecified: Secondary | ICD-10-CM | POA: Diagnosis not present

## 2020-01-08 DIAGNOSIS — R05 Cough: Secondary | ICD-10-CM | POA: Diagnosis not present

## 2020-01-08 DIAGNOSIS — J301 Allergic rhinitis due to pollen: Secondary | ICD-10-CM | POA: Diagnosis not present

## 2020-01-08 DIAGNOSIS — H1045 Other chronic allergic conjunctivitis: Secondary | ICD-10-CM | POA: Diagnosis not present

## 2020-01-08 DIAGNOSIS — J3089 Other allergic rhinitis: Secondary | ICD-10-CM | POA: Diagnosis not present

## 2020-01-16 DIAGNOSIS — N6002 Solitary cyst of left breast: Secondary | ICD-10-CM | POA: Diagnosis not present

## 2020-01-16 NOTE — Progress Notes (Signed)
CARDIOLOGY CONSULT NOTE       Patient ID: Sheri Andrews MRN: [id number] DOB/AGE: 70-Aug-1952 70 y.o.  Admit date: (Not on file) Referring Physician: [name] Primary Physician: [name], MD Primary Cardiologist: New Reason for Consultation: Chest Pain  Active Problems:   * No active hospital problems. *   HPI:  69 y.o. referred by Dr [name] for chest pain and family history of premature CAD 3/4 siblings have had MI She is being Rx with statin for HLD LDL 109 She had a normal Myovue in 2010 and normal ETT in March 2019 achieving 7 Mets and 102% PMHR She has had significant reflux and barrett's with esophageal stretching Rx with calcium blocker for HTN and carries a diagnosis of asthma with no smoking history   Coronary calcium score 8 in 2015 isolated to mid/distal LAD  Likely had COVID last February in [place] and was sick for weeks   ROS All other systems reviewed and negative except as noted above  Past Medical History:  Diagnosis Date  . Acid reflux disease   . Allergic rhinitis   . Anxiety   . Asthma   . Barrett esophagus   . Dyspnea   . Dysthymia   . Hypercholesterolemia   . Hypertension   . Irritable bowel syndrome   . Nephrolithiasis   . Osteoporosis   . Other chest pain   . Status post dilation of esophageal narrowing     Family History  Problem Relation Age of Onset  . Heart disease Father   . Heart disease Mother   . Diabetes Mother   . Diabetes Sister   . Diabetes Brother   . Cancer Maternal Uncle        type unknown  . Throat cancer Paternal Uncle   . Heart attack Sister   . Colon cancer Neg Hx   . Stomach cancer Neg Hx   . Rectal cancer Neg Hx   . Pancreatic cancer Neg Hx     Social History   Socioeconomic History  . Marital status: Married    Spouse name: Not on file  . Number of children: 2  . Years of education: Not on file  . Highest education level: Not on file  Occupational History  . Occupation: retired  Tobacco  Use  . Smoking status: Never Smoker  . Smokeless tobacco: Never Used  Vaping Use  . Vaping Use: Never used  Substance and Sexual Activity  . Alcohol use: Yes    Alcohol/week: 3.0 standard drinks    Types: 3 Standard drinks or equivalent per week    Comment: social  . Drug use: No  . Sexual activity: Yes    Partners: Male  Other Topics Concern  . Not on file  Social History Narrative  . Not on file   Social Determinants of Health   Financial Resource Strain:   . Difficulty of Paying Living Expenses: Not on file  Food Insecurity:   . Worried About Charity fundraiser in the Last Year: Not on file  . Ran Out of Food in the Last Year: Not on file  Transportation Needs:   . Lack of Transportation (Medical): Not on file  . Lack of Transportation (Non-Medical): Not on file  Physical Activity:   . Days of Exercise per Week: Not on file  . Minutes of Exercise per Session: Not on file  Stress:   . Feeling of Stress : Not on file  Social Connections:   .  Frequency of Communication with Friends and Family: Not on file  . Frequency of Social Gatherings with Friends and Family: Not on file  . Attends Religious Services: Not on file  . Active Member of Clubs or Organizations: Not on file  . Attends Archivist Meetings: Not on file  . Marital Status: Not on file  Intimate Partner Violence:   . Fear of Current or Ex-Partner: Not on file  . Emotionally Abused: Not on file  . Physically Abused: Not on file  . Sexually Abused: Not on file    Past Surgical History:  Procedure Laterality Date  . ESOPHAGOGASTRODUODENOSCOPY (EGD) WITH PROPOFOL N/A 05/02/2019   Procedure: ESOPHAGOGASTRODUODENOSCOPY (EGD) WITH PROPOFOL;  Surgeon: Mauri Pole, MD;  Location: [facility];  Service: Endoscopy;  Laterality: N/A;  needing APC available  . FACIAL COSMETIC SURGERY    . 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  . TUBAL LIGATION  1986      Current Outpatient  Medications:  .  amLODipine (NORVASC) 10 MG tablet, Take 1 tablet (10 mg total) by mouth daily., Disp: 90 tablet, Rfl: 3 .  Artificial Tear Solution (TEARS NATURALE OP), Place 1 drop into both eyes See admin instructions. Use 1 drop into both eyes (scheduled) in the morning & may use 3 times daily if needed for dry /irritated eyes., Disp: , Rfl:  .  azelastine (OPTIVAR) 0.05 % ophthalmic solution, Place 1 drop into both eyes daily as needed for allergies., Disp: , Rfl:  .  budesonide-formoterol (SYMBICORT) 160-4.5 MCG/ACT inhaler, Inhale 2 puffs into the lungs 2 (two) times daily., Disp: , Rfl:  .  cholecalciferol (VITAMIN D) 25 MCG (1000 UT) tablet, Take 1,000 Units by mouth at bedtime., Disp: , Rfl:  .  ciclopirox (PENLAC) 8 % solution, Apply topically at bedtime. Apply over nail and surrounding skin. Apply daily over previous coat. After seven (7) days, may remove with alcohol and continue cycle. (Patient taking differently: Apply 1 application topically daily as needed (nail fungus.). Apply over nail and surrounding skin. Apply daily over previous coat. After seven (7) days, may remove with alcohol and continue cycle.), Disp: 6.6 mL, Rfl: 6 .  clonazePAM (KLONOPIN) 0.25 MG disintegrating tablet, DISSOLVE ONE TABLET BY MOUTH EVERY DAY AS NEEDED, Disp: 30 tablet, Rfl: 0 .  EPIPEN 2-PAK 0.3 MG/0.3ML SOAJ injection, Inject 0.3 mg into the muscle as needed for anaphylaxis. , Disp: , Rfl: 1 .  estrogen, conjugated,-medroxyprogesterone (PREMPRO) 0.45-1.5 MG per tablet, Take 1 tablet by mouth at bedtime. , Disp: , Rfl:  .  famotidine (PEPCID) 40 MG tablet, Take 1 tablet (40 mg total) by mouth daily., Disp: 90 tablet, Rfl: 3 .  FLUoxetine (PROZAC) 40 MG capsule, Take 1 capsule (40 mg total) by mouth daily., Disp: 90 capsule, Rfl: 3 .  fluticasone (FLONASE) 50 MCG/ACT nasal spray, Place 2 sprays into both nostrils daily., Disp: , Rfl:  .  montelukast (SINGULAIR) 10 MG tablet, Take 1 tablet (10 mg total) by  mouth at bedtime., Disp: 90 tablet, Rfl: 3 .  Olopatadine HCl (PAZEO) 0.7 % SOLN, Place 1 drop into both eyes daily as needed (allergies.). , Disp: , Rfl:  .  pantoprazole (PROTONIX) 40 MG tablet, Take 1 tablet (40 mg total) by mouth daily., Disp: 180 tablet, Rfl: 3 .  Peppermint Oil (IBGARD PO), Take 1 tablet by mouth daily. , Disp: , Rfl:  .  polyethylene glycol (MIRALAX / GLYCOLAX) 17 g packet, Take 17 g  by mouth daily as needed for mild constipation. , Disp: , Rfl:  .  Probiotic Product (PROBIOTIC COLON SUPPORT PO), Take 1 capsule by mouth at bedtime. , Disp: , Rfl:  .  simvastatin (ZOCOR) 40 MG tablet, TAKE 1 TABLET(40 MG) BY MOUTH DAILY AT 6 PM, Disp: 90 tablet, Rfl: 3    Physical Exam: There were no vitals taken for this visit.   Affect appropriate Healthy:  appears stated age 52: normal Neck supple with no adenopathy JVP normal no bruits no thyromegaly Lungs clear with no wheezing and good diaphragmatic motion Heart:  S1/S2 no murmur, no rub, gallop or click PMI normal Abdomen: benighn, BS positve, no tenderness, no AAA no bruit.  No HSM or HJR Distal pulses intact with no bruits No edema Neuro non-focal Skin warm and dry No muscular weakness   Labs:   Lab Results  Component Value Date   WBC 4.9 11/24/2019   HGB 13.7 11/24/2019   HCT 39.9 11/24/2019   MCV 94.3 11/24/2019   PLT 245 11/24/2019   No results for input(s): NA, K, CL, CO2, BUN, CREATININE, CALCIUM, PROT, BILITOT, ALKPHOS, ALT, AST, GLUCOSE in the last 168 hours.  Invalid input(s): LABALBU Lab Results  Component Value Date   CKTOTAL 79 08/11/2008   CKMB 0.7 08/11/2008   TROPONINI <0.01        NO INDICATION OF MYOCARDIAL INJURY. 08/11/2008    Lab Results  Component Value Date   CHOL 182 11/24/2019   CHOL 173 09/06/2018   CHOL 148 08/12/2017   Lab Results  Component Value Date   HDL 44 (L) 11/24/2019   HDL 40.30 09/06/2018   HDL 45.40 08/12/2017   Lab Results  Component Value Date    LDLCALC 109 (H) 11/24/2019   LDLCALC 79 08/12/2017   LDLCALC 105 (H) 05/14/2016   Lab Results  Component Value Date   TRIG 175 (H) 11/24/2019   TRIG 211.0 (H) 09/06/2018   TRIG 120.0 08/12/2017   Lab Results  Component Value Date   CHOLHDL 4.1 11/24/2019   CHOLHDL 4 09/06/2018   CHOLHDL 3 08/12/2017   Lab Results  Component Value Date   LDLDIRECT 102.0 09/06/2018   LDLDIRECT 149.1 02/01/2012   LDLDIRECT 140.2 12/19/2009      Radiology: OCT, Retina - OU - Both Eyes  Result Date: 01/03/2020 Right Eye Quality was good. Scan locations included subfoveal. Central Foveal Thickness: 279. Progression has been stable. Findings include epiretinal membrane. Left Eye Quality was good. Scan locations included subfoveal. Central Foveal Thickness: 331. Progression has been stable. Findings include epiretinal membrane. Notes None foveal distorting epiretinal membrane superiorly OD, observe With epiretinal membrane overlying the fovea with pseudo-CME Perifoveal, schisis yet with good acuity okay to observe   EKG: SR rate 89 normal 01/25/20    ASSESSMENT AND PLAN:   1. HLD:  Premature family history of CAD Discussed utility of calcium score On Zocor dose increased to 40 mg by primary on 11/24/19 Adjust Rx based on new score   2. Chest Pain:  Normal ETT in 2019 CRF;s HTN, HLD, and premature family history see above   3. Asthma:  No active wheezing continue symbicort   4. GERD:  With esophageal dilatation low carb diet Continue pepcid and protonix   Signed: Jenkins Rouge 01/16/2020, 5:27 PM

## 2020-01-25 ENCOUNTER — Ambulatory Visit (INDEPENDENT_AMBULATORY_CARE_PROVIDER_SITE_OTHER)
Admission: RE | Admit: 2020-01-25 | Discharge: 2020-01-25 | Disposition: A | Discharge status: Home / Self Care | Payer: Self-pay | Source: Ambulatory Visit | Attending: Cardiovascular Disease | Admitting: Cardiovascular Disease

## 2020-01-25 ENCOUNTER — Other Ambulatory Visit: Payer: Self-pay

## 2020-01-25 ENCOUNTER — Ambulatory Visit: Payer: PPO | Admitting: Cardiovascular Disease

## 2020-01-25 ENCOUNTER — Encounter: Payer: Self-pay | Admitting: Cardiovascular Disease

## 2020-01-25 ENCOUNTER — Telehealth: Payer: Self-pay

## 2020-01-25 VITALS — BP 132/76 | HR 89 | Ht 63.5 in | Wt 136.8 lb

## 2020-01-25 DIAGNOSIS — E785 Hyperlipidemia, unspecified: Secondary | ICD-10-CM

## 2020-01-25 DIAGNOSIS — R079 Chest pain, unspecified: Secondary | ICD-10-CM

## 2020-01-25 MED ORDER — ATORVASTATIN CALCIUM 40 MG PO TABS: 40.0000 mg | ORAL_TABLET | Freq: Every day | ORAL | 3 refills | Status: DC

## 2020-01-25 NOTE — Telephone Encounter (Signed)
-----   Message from Josue Hector, MD sent at 01/25/2020  2:21 PM EDT ----- Calcium score has gone from 8 to 72 and higher percentile compared to 2015 Primary recently increased zocor f/u labs 3 months target LDL <70 if still higher will change to crestor or lipitor

## 2020-01-25 NOTE — Telephone Encounter (Signed)
The patient has been notified of the result and verbalized understanding.  All questions (if any) were answered. [place], RN 01/25/2020 4:52 PM     Patient will start Lipitor 40 mg by mouth daily and discontinue simvastatin. Patient will come into office to get lab work on 04/27/19.

## 2020-01-25 NOTE — Patient Instructions (Addendum)
Medication Instructions:  *If you need a refill on your cardiac medications before your next appointment, please call your pharmacy*  Lab Work: If you have labs (blood work) drawn today and your tests are completely normal, you will receive your results only by: . MyChart Message (if you have MyChart) OR . A paper copy in the mail If you have any lab test that is abnormal or we need to change your treatment, we will call you to review the results.  Testing/Procedures: Cardiac CT scanning for calcium score, (CAT scanning), is a noninvasive, special x-ray that produces cross-sectional images of the body using x-rays and a computer. CT scans help physicians diagnose and treat medical conditions. For some CT exams, a contrast material is used to enhance visibility in the area of the body being studied. CT scans provide greater clarity and reveal more details than regular x-ray exams.  Follow-Up: At [facility], you and your health needs are our priority.  As part of our continuing mission to provide you with exceptional heart care, we have created designated Provider Care Teams.  These Care Teams include your primary Cardiologist (physician) and Advanced Practice Providers (APPs -  Physician Assistants and Nurse Practitioners) who all work together to provide you with the care you need, when you need it.  We recommend signing up for the patient portal called "MyChart".  Sign up information is provided on this After Visit Summary.  MyChart is used to connect with patients for Virtual Visits (Telemedicine).  Patients are able to view lab/test results, encounter notes, upcoming appointments, etc.  Non-urgent messages can be sent to your provider as well.   To learn more about what you can do with MyChart, go to [email or web address].    Your next appointment:   12 month(s)  The format for your next appointment:   In Person  Provider:   You may see Dr. Nishan or one of the following Advanced  Practice Providers on your designated Care Team:    Lori Gerhardt, NP  Laura Ingold, NP  Jill McDaniel, NP     

## 2020-01-26 ENCOUNTER — Telehealth: Payer: Self-pay | Admitting: Cardiovascular Disease

## 2020-01-26 NOTE — Telephone Encounter (Signed)
Patient wanted to know if lipitor was a stronger drug then the one she was on. Informed patient Lipitor is stronger than her previous medication. Encouraged patient to call our office if she has any issues with new medications.

## 2020-01-26 NOTE — Telephone Encounter (Signed)
New Message:    Please call, she have some questions abouy her new medicine, Lipitor.

## 2020-01-30 DIAGNOSIS — J3089 Other allergic rhinitis: Secondary | ICD-10-CM | POA: Diagnosis not present

## 2020-01-30 DIAGNOSIS — R059 Cough, unspecified: Secondary | ICD-10-CM | POA: Diagnosis not present

## 2020-01-30 DIAGNOSIS — J301 Allergic rhinitis due to pollen: Secondary | ICD-10-CM | POA: Diagnosis not present

## 2020-01-30 DIAGNOSIS — Z23 Encounter for immunization: Secondary | ICD-10-CM | POA: Diagnosis not present

## 2020-02-27 ENCOUNTER — Ambulatory Visit: Payer: PPO | Attending: Internal Medicine

## 2020-02-27 DIAGNOSIS — Z23 Encounter for immunization: Secondary | ICD-10-CM

## 2020-02-27 NOTE — Progress Notes (Signed)
   Covid-19 Vaccination Clinic  Name:  Sheri Andrews    MRN: [id number] DOB: 14-Jul-1950  02/27/2020  [name] was observed post Covid-19 immunization for 15 minutes without incident. She was provided with Vaccine Information Sheet and instruction to access the V-Safe system.   [name] was instructed to call 911 with any severe reactions post vaccine: Marland Kitchen Difficulty breathing  . Swelling of face and throat  . A fast heartbeat  . A bad rash all over body  . Dizziness and weakness

## 2020-03-04 DIAGNOSIS — J3089 Other allergic rhinitis: Secondary | ICD-10-CM | POA: Diagnosis not present

## 2020-03-04 DIAGNOSIS — J301 Allergic rhinitis due to pollen: Secondary | ICD-10-CM | POA: Diagnosis not present

## 2020-03-06 DIAGNOSIS — Z124 Encounter for screening for malignant neoplasm of cervix: Secondary | ICD-10-CM | POA: Diagnosis not present

## 2020-03-06 DIAGNOSIS — Z01419 Encounter for gynecological examination (general) (routine) without abnormal findings: Secondary | ICD-10-CM | POA: Diagnosis not present

## 2020-03-06 DIAGNOSIS — Z79890 Hormone replacement therapy (postmenopausal): Secondary | ICD-10-CM | POA: Diagnosis not present

## 2020-03-07 DIAGNOSIS — Z124 Encounter for screening for malignant neoplasm of cervix: Secondary | ICD-10-CM | POA: Diagnosis not present

## 2020-03-08 DIAGNOSIS — J301 Allergic rhinitis due to pollen: Secondary | ICD-10-CM | POA: Diagnosis not present

## 2020-03-08 DIAGNOSIS — J3089 Other allergic rhinitis: Secondary | ICD-10-CM | POA: Diagnosis not present

## 2020-03-25 DIAGNOSIS — J301 Allergic rhinitis due to pollen: Secondary | ICD-10-CM | POA: Diagnosis not present

## 2020-03-25 DIAGNOSIS — J3089 Other allergic rhinitis: Secondary | ICD-10-CM | POA: Diagnosis not present

## 2020-04-02 ENCOUNTER — Telehealth: Payer: Self-pay | Admitting: Internal Medicine

## 2020-04-02 NOTE — Progress Notes (Signed)
  Chronic Care Management   Outreach Note  04/02/2020 Name: Sheri Andrews MRN: [id number] DOB: 01-13-1951  Referred by: [name], MD Reason for referral : No chief complaint on file.   An unsuccessful telephone outreach was attempted today. The patient was referred to the pharmacist for assistance with care management and care coordination.   Follow Up Plan:   Carley Perdue [employer] Scheduler

## 2020-04-08 ENCOUNTER — Telehealth: Payer: Self-pay | Admitting: Internal Medicine

## 2020-04-08 NOTE — Progress Notes (Signed)
  Chronic Care Management   Note  04/08/2020 Name: Sheri Andrews MRN: [id number] DOB: [birth year]  Sheri Andrews is a 69 y.o. year old female who is a primary care patient of [name], MD. I reached out to Margie Ege by phone today in response to a referral sent by Ms. Arville Go PCP, [name], MD.   Sheri Andrews was given information about Chronic Care Management services today including:  1. CCM service includes personalized support from designated clinical staff supervised by her physician, including individualized plan of care and coordination with other care providers 2. 24/7 contact phone numbers for assistance for urgent and routine care needs. 3. Service will only be billed when office clinical staff spend 20 minutes or more in a month to coordinate care. 4. Only one practitioner may furnish and bill the service in a calendar month. 5. The patient may stop CCM services at any time (effective at the end of the month) by phone call to the office staff.   Patient agreed to services and verbal consent obtained.   Follow up plan:   Carley Perdue [employer] Scheduler

## 2020-04-15 ENCOUNTER — Encounter (HOSPITAL_COMMUNITY): Payer: Self-pay

## 2020-04-15 DIAGNOSIS — J069 Acute upper respiratory infection, unspecified: Secondary | ICD-10-CM | POA: Diagnosis not present

## 2020-04-15 DIAGNOSIS — R079 Chest pain, unspecified: Secondary | ICD-10-CM | POA: Diagnosis not present

## 2020-04-15 DIAGNOSIS — J45909 Unspecified asthma, uncomplicated: Secondary | ICD-10-CM | POA: Insufficient documentation

## 2020-04-15 DIAGNOSIS — I10 Essential (primary) hypertension: Secondary | ICD-10-CM | POA: Insufficient documentation

## 2020-04-15 DIAGNOSIS — R059 Cough, unspecified: Secondary | ICD-10-CM | POA: Diagnosis not present

## 2020-04-15 DIAGNOSIS — R0989 Other specified symptoms and signs involving the circulatory and respiratory systems: Secondary | ICD-10-CM | POA: Insufficient documentation

## 2020-04-15 DIAGNOSIS — R519 Headache, unspecified: Secondary | ICD-10-CM | POA: Diagnosis not present

## 2020-04-15 DIAGNOSIS — Z20822 Contact with and (suspected) exposure to covid-19: Secondary | ICD-10-CM | POA: Diagnosis not present

## 2020-04-15 DIAGNOSIS — J029 Acute pharyngitis, unspecified: Secondary | ICD-10-CM | POA: Insufficient documentation

## 2020-04-15 DIAGNOSIS — J3489 Other specified disorders of nose and nasal sinuses: Secondary | ICD-10-CM | POA: Diagnosis not present

## 2020-04-15 DIAGNOSIS — R000 Tachycardia, unspecified: Secondary | ICD-10-CM | POA: Diagnosis not present

## 2020-04-15 DIAGNOSIS — Z79899 Other long term (current) drug therapy: Secondary | ICD-10-CM | POA: Insufficient documentation

## 2020-04-15 DIAGNOSIS — R0602 Shortness of breath: Secondary | ICD-10-CM | POA: Diagnosis not present

## 2020-04-15 DIAGNOSIS — R599 Enlarged lymph nodes, unspecified: Secondary | ICD-10-CM | POA: Diagnosis not present

## 2020-04-15 DIAGNOSIS — R0789 Other chest pain: Secondary | ICD-10-CM | POA: Diagnosis not present

## 2020-04-15 DIAGNOSIS — R410 Disorientation, unspecified: Secondary | ICD-10-CM | POA: Diagnosis not present

## 2020-04-15 DIAGNOSIS — M549 Dorsalgia, unspecified: Secondary | ICD-10-CM | POA: Diagnosis not present

## 2020-04-15 NOTE — ED Notes (Signed)
Pt reported that the pain moved from the center of her chest to slightly left of her sternum.

## 2020-04-15 NOTE — ED Triage Notes (Signed)
Pt denies any fever and state no relief with OTC musinex and cold medicine

## 2020-04-15 NOTE — ED Triage Notes (Signed)
Pt complains of shortness of breath and chest congestion with a dry cough for two days

## 2020-04-16 ENCOUNTER — Emergency Department (HOSPITAL_COMMUNITY): Payer: PPO

## 2020-04-16 ENCOUNTER — Emergency Department (HOSPITAL_COMMUNITY)
Admission: EM | Admit: 2020-04-16 | Discharge: 2020-04-16 | Disposition: A | Discharge status: Home / Self Care | Payer: PPO | Attending: Emergency Medicine | Admitting: Emergency Medicine

## 2020-04-16 DIAGNOSIS — J069 Acute upper respiratory infection, unspecified: Secondary | ICD-10-CM

## 2020-04-16 DIAGNOSIS — R059 Cough, unspecified: Secondary | ICD-10-CM | POA: Diagnosis not present

## 2020-04-16 LAB — CBC WITH DIFFERENTIAL/PLATELET
Abs Immature Granulocytes: 0.01 10*3/uL (ref 0.00–0.07)
Basophils Absolute: 0 10*3/uL (ref 0.0–0.1)
Basophils Relative: 0 %
Eosinophils Absolute: 0 10*3/uL (ref 0.0–0.5)
Eosinophils Relative: 0 %
HCT: 37.6 % (ref 36.0–46.0)
Hemoglobin: 12.7 g/dL (ref 12.0–15.0)
Immature Granulocytes: 0 %
Lymphocytes Relative: 23 %
Lymphs Abs: 1.5 10*3/uL (ref 0.7–4.0)
MCH: 31.8 pg (ref 26.0–34.0)
MCHC: 33.8 g/dL (ref 30.0–36.0)
MCV: 94 fL (ref 80.0–100.0)
Monocytes Absolute: 0.7 10*3/uL (ref 0.1–1.0)
Monocytes Relative: 10 %
Neutro Abs: 4.4 10*3/uL (ref 1.7–7.7)
Neutrophils Relative %: 67 %
Platelets: 213 10*3/uL (ref 150–400)
RBC: 4 MIL/uL (ref 3.87–5.11)
RDW: 13.2 % (ref 11.5–15.5)
WBC: 6.6 10*3/uL (ref 4.0–10.5)
nRBC: 0 % (ref 0.0–0.2)

## 2020-04-16 LAB — SARS CORONAVIRUS 2 (TAT 6-24 HRS): SARS Coronavirus 2: NEGATIVE

## 2020-04-16 LAB — BASIC METABOLIC PANEL
Anion gap: 11 (ref 5–15)
BUN: 14 mg/dL (ref 8–23)
CO2: 25 mmol/L (ref 22–32)
Calcium: 9.2 mg/dL (ref 8.9–10.3)
Chloride: 103 mmol/L (ref 98–111)
Creatinine, Ser: 0.84 mg/dL (ref 0.44–1.00)
GFR, Estimated: >60 mL/min (ref >60)
Glucose, Bld: 106 mg/dL — ABNORMAL HIGH (ref 70–99)
Potassium: 3.7 mmol/L (ref 3.5–5.1)
Sodium: 139 mmol/L (ref 135–145)

## 2020-04-16 MED ORDER — AZELASTINE HCL 0.15 % NA SOLN: 2.0000 | Freq: Two times a day (BID) | NASAL | 0 refills | Status: DC

## 2020-04-16 NOTE — Discharge Instructions (Addendum)
Please self isolate until your COVID test has resulted. This will be available on MyChart. You appear to have an upper respiratory infection (URI). An upper respiratory tract infection, or cold, is a viral infection of the air passages leading to the lungs. It is contagious and can be spread to others, especially during the first 3 or 4 days. It cannot be cured by antibiotics or other medicines. RETURN IMMEDIATELY IF you develop shortness of breath, confusion or altered mental status, a new rash, become dizzy, faint, or poorly responsive, or are unable to be cared for at home.

## 2020-04-16 NOTE — ED Provider Notes (Signed)
[facility] Note   CSN: [id number] Arrival date & time: 04/15/20  2308     History No chief complaint on file.   Sheri Andrews is a 69 y.o. female who presents emergency department with chief complaint of URI symptoms.  She complains of sinus pressure, headaches, cough, sore throat.  She has tried over-the-counter medications without improvement.  She has been vaccinated against the coronavirus.  She denies exertional dyspnea or chest pain but does have pain with cough.  She does feel short of breath when she gets into paroxysms of coughing.  She denies active chest pain at this time or anginal symptoms.  She has a history of hyper tension, hypercholesterolemia.  HPI     Past Medical History:  Diagnosis Date  . Acid reflux disease   . Allergic rhinitis   . Anxiety   . Asthma   . Barrett esophagus   . Dyspnea   . Dysthymia   . Hypercholesterolemia   . Hypertension   . Irritable bowel syndrome   . Nephrolithiasis   . Osteoporosis   . Other chest pain   . Status post dilation of esophageal narrowing     Patient Active Problem List   Diagnosis Date Noted  . Right epiretinal membrane 01/03/2020  . Left epiretinal membrane 01/03/2020  . Nuclear sclerotic cataract of right eye 01/03/2020  . Nuclear sclerotic cataract of left eye 01/03/2020  . 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 Anatomical narrow angle of right eye 01/03/2020  . GAVE (gastric antral vascular ectasia)   . Iron deficiency anemia due to chronic blood loss   . Asthma 05/25/2018  . Essential hypertension 06/29/2017  . Routine general medical examination at a health care facility 05/15/2016  . Family history of early CAD 03/09/2014  . Osteopenia 02/01/2012  . BARRETTS ESOPHAGUS 10/08/2008  . NEPHROLITHIASIS 04/30/2007  . OCD (obsessive compulsive disorder) 04/11/2007  . HYPERCHOLESTEROLEMIA 04/08/2007  . IRRITABLE BOWEL SYNDROME  04/08/2007    Past Surgical History:  Procedure Laterality Date  . ESOPHAGOGASTRODUODENOSCOPY (EGD) WITH PROPOFOL N/A 05/02/2019   Procedure: ESOPHAGOGASTRODUODENOSCOPY (EGD) WITH PROPOFOL;  Surgeon: Napoleon Form, MD;  Location: [facility];  Service: Endoscopy;  Laterality: N/A;  needing APC available  . FACIAL COSMETIC SURGERY    . 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  . TUBAL LIGATION  1986     OB History   No obstetric history on file.     Family History  Problem Relation Age of Onset  . Heart disease Father   . Heart disease Mother   . Diabetes Mother   . Diabetes Sister   . Diabetes Brother   . Cancer Maternal Uncle        type unknown  . Throat cancer Paternal Uncle   . Heart attack Sister   . Colon cancer Neg Hx   . Stomach cancer Neg Hx   . Rectal cancer Neg Hx   . Pancreatic cancer Neg Hx     Social History   Tobacco Use  . Smoking status: Never Smoker  . Smokeless tobacco: Never Used  Vaping Use  . Vaping Use: Never used  Substance Use Topics  . Alcohol use: Yes    Alcohol/week: 3.0 standard drinks    Types: 3 Standard drinks or equivalent per week    Comment: social  . Drug use: No    Home Medications Prior to Admission medications  Medication Sig Start Date End Date Taking? Authorizing Provider  amLODipine (NORVASC) 10 MG tablet Take 1 tablet (10 mg total) by mouth daily. 11/24/19   [name], MD  amoxicillin-clavulanate (AUGMENTIN) 875-125 MG tablet SMARTSIG:1 Tablet(s) By Mouth Every 12 Hours 01/08/20   [provider]  Artificial Tear Solution (TEARS NATURALE OP) Place 1 drop into both eyes See admin instructions. Use 1 drop into both eyes (scheduled) in the morning & may use 3 times daily if needed for dry /irritated eyes.    [provider]  atorvastatin (LIPITOR) 40 MG tablet Take 1 tablet (40 mg total) by mouth daily. 01/25/20   Josue Hector, MD  azelastine (OPTIVAR) 0.05 % ophthalmic  solution Place 1 drop into both eyes daily as needed for allergies. 11/04/18   [provider]  budesonide-formoterol (SYMBICORT) 160-4.5 MCG/ACT inhaler Inhale 2 puffs into the lungs 2 (two) times daily.    [provider]  cholecalciferol (VITAMIN D) 25 MCG (1000 UT) tablet Take 1,000 Units by mouth at bedtime.    [provider]  ciclopirox (PENLAC) 8 % solution Apply topically at bedtime. Apply over nail and surrounding skin. Apply daily over previous coat. After seven (7) days, may remove with alcohol and continue cycle. 11/25/18   [name], MD  clonazePAM (KLONOPIN) 0.25 MG disintegrating tablet DISSOLVE ONE TABLET BY MOUTH EVERY DAY AS NEEDED 05/19/19   [name], MD  EPIPEN 2-PAK 0.3 MG/0.3ML SOAJ injection Inject 0.3 mg into the muscle as needed for anaphylaxis.  02/25/14   [provider]  estrogen, conjugated,-medroxyprogesterone (PREMPRO) 0.45-1.5 MG per tablet Take 1 tablet by mouth at bedtime.     [provider]  famotidine (PEPCID) 40 MG tablet Take 1 tablet (40 mg total) by mouth daily. 11/24/19   [name], MD  fluconazole (DIFLUCAN) 150 MG tablet Take 150 mg by mouth as directed. 01/22/20   [provider]  FLUoxetine (PROZAC) 40 MG capsule Take 1 capsule (40 mg total) by mouth daily. 12/06/19   Biagio Borg, MD  fluticasone (FLONASE) 50 MCG/ACT nasal spray Place 2 sprays into both nostrils daily. 12/17/18   [provider]  montelukast (SINGULAIR) 10 MG tablet Take 1 tablet (10 mg total) by mouth at bedtime. 11/24/19   [name], MD  Olopatadine HCl (PAZEO) 0.7 % SOLN Place 1 drop into both eyes daily as needed (allergies.).     [provider]  pantoprazole (PROTONIX) 40 MG tablet Take 1 tablet (40 mg total) by mouth daily. 11/24/19   [name], MD  polyethylene glycol (MIRALAX / GLYCOLAX) 17 g packet Take 17 g by mouth daily as needed for mild constipation.      [provider]  Probiotic Product (PROBIOTIC COLON SUPPORT PO) Take 1 capsule by mouth at bedtime.     [provider]    Allergies    Bupropion hcl, Doxycycline hyclate, and Erythromycin ethylsuccinate  Review of Systems   Review of Systems Ten systems reviewed and are negative for acute change, except as noted in the HPI.   Physical Exam Updated Vital Signs BP 136/73   Pulse 87   Temp 98.3 F (36.8 C) (Oral)   Resp 17   Ht 5\' 3"  (1.6 m)   Wt 62.1 kg   SpO2 96%   BMI 24.27 kg/m   Physical Exam Vitals and nursing note reviewed.  Constitutional:      General: She is not in acute  distress.    Appearance: She is well-developed and well-nourished. She is ill-appearing. She is not toxic-appearing or diaphoretic.  HENT:     Head: Normocephalic and atraumatic.     Right Ear: Tympanic membrane normal.     Left Ear: Tympanic membrane normal.     Nose: Congestion present.     Mouth/Throat:     Mouth: Mucous membranes are moist.     Pharynx: Uvula midline. Posterior oropharyngeal erythema present. No pharyngeal swelling, oropharyngeal exudate or uvula swelling.     Tonsils: No tonsillar exudate or tonsillar abscesses.  Eyes:     General: No scleral icterus.    Conjunctiva/sclera: Conjunctivae normal.  Cardiovascular:     Rate and Rhythm: Normal rate and regular rhythm.     Heart sounds: Normal heart sounds. No murmur heard. No friction rub. No gallop.   Pulmonary:     Effort: Pulmonary effort is normal. No respiratory distress.     Breath sounds: Normal breath sounds.  Abdominal:     General: Bowel sounds are normal. There is no distension.     Palpations: Abdomen is soft. There is no mass.     Tenderness: There is no abdominal tenderness. There is no guarding.  Musculoskeletal:     Cervical back: Normal range of motion.  Lymphadenopathy:     Cervical: Cervical adenopathy present.     Right cervical: Superficial cervical adenopathy present.     Left  cervical: Superficial cervical adenopathy present.  Skin:    General: Skin is warm and dry.  Neurological:     Mental Status: She is alert and oriented to person, place, and time.  Psychiatric:        Behavior: Behavior normal.     ED Results / Procedures / Treatments   Labs (all labs ordered are listed, but only abnormal results are displayed) Labs Reviewed  BASIC METABOLIC PANEL - Abnormal; Notable for the following components:      Result Value   Glucose, Bld 106 (*)    All other components within normal limits  SARS CORONAVIRUS 2 (TAT 6-24 HRS)  CBC WITH DIFFERENTIAL/PLATELET    EKG EKG Interpretation  Date/Time:  Monday April 15 2020 23:21:47 EST Ventricular Rate:  106 PR Interval:    QRS Duration: 66 QT Interval:  333 QTC Calculation: 443 R Axis:   76 Text Interpretation: Sinus tachycardia 12 Lead; Mason-Likar T wave flattening, non-specific Confirmed by Pricilla Loveless [phone]) on 04/16/2020 7:05:10 AM   Radiology DG Chest 1 View  Result Date: 04/16/2020 CLINICAL DATA:  Cough EXAM: CHEST  1 VIEW COMPARISON:  None. FINDINGS: The heart size and mediastinal contours are within normal limits. Both lungs are clear. No pleural effusion. The visualized skeletal structures are unremarkable. IMPRESSION: No acute process in the chest. Electronically Signed   By: Guadlupe Spanish M.D.   On: 04/16/2020 08:20    Procedures Procedures (including critical care time)  Medications Ordered in ED Medications - No data to display  ED Course  I have reviewed the triage vital signs and the nursing notes.  Pertinent labs & imaging results that were available during my care of the patient were reviewed by me and considered in my medical decision making (see chart for details).    MDM Rules/Calculators/A&P                          Patient here with complaint of URI symptoms.  I have no other concern for  ACS or pulmonary embolus.  I ordered and reviewed labs which include CBC  which shows no white blood cell count, BMP with just mildly elevated glucose of insignificant value.  Her Covid and flu tests are pending.  I ordered and reviewed a 1 view chest x-ray which shows no acute abnormalities.  Patient's EKG shows sinus tachycardia but she has had normal vital signs without tachycardia throughout her visit and I suspect this was taken just after walking or coughing.  She does not have any pleuritic chest pain.  She does not have persistent chest pain or pressure.  She does not have symptoms that are at all typical for ACS.  She looks like she has and is complaining of a cold with erythematous runny nose, facial swelling, cough.  She appears mildly ill but nontoxic.  Will d/c with nasal decongestant.   JAEANNA YEATON was evaluated in Emergency Department on 04/16/2020 for the symptoms described in the history of present illness. She was evaluated in the context of the global COVID-19 pandemic, which necessitated consideration that the patient might be at risk for infection with the SARS-CoV-2 virus that causes COVID-19. Institutional protocols and algorithms that pertain to the evaluation of patients at risk for COVID-19 are in a state of rapid change based on information released by regulatory bodies including the CDC and federal and state organizations. These policies and algorithms were followed during the patient's care in the ED.  Final Clinical Impression(s) / ED Diagnoses Final diagnoses:  None    Rx / DC Orders ED Discharge Orders    None       Margarita Mail, PA-C 04/16/20 [id number]    Sherwood Gambler, MD 04/17/20 [phone]

## 2020-04-26 ENCOUNTER — Other Ambulatory Visit: Payer: PPO | Admitting: *Deleted

## 2020-04-26 ENCOUNTER — Other Ambulatory Visit: Payer: Self-pay

## 2020-04-26 DIAGNOSIS — E785 Hyperlipidemia, unspecified: Secondary | ICD-10-CM

## 2020-04-27 LAB — LIPID PANEL
Chol/HDL Ratio: 4.6 ratio — ABNORMAL HIGH (ref 0.0–4.4)
Cholesterol, Total: 148 mg/dL (ref 100–199)
HDL: 32 mg/dL — ABNORMAL LOW (ref >39)
LDL Chol Calc (NIH): 89 mg/dL (ref 0–99)
Triglycerides: 153 mg/dL — ABNORMAL HIGH (ref 0–149)
VLDL Cholesterol Cal: 27 mg/dL (ref 5–40)

## 2020-04-27 LAB — HEPATIC FUNCTION PANEL
ALT: 17 IU/L (ref 0–32)
AST: 20 IU/L (ref 0–40)
Albumin: 4 g/dL (ref 3.8–4.8)
Alkaline Phosphatase: 53 IU/L (ref 44–121)
Bilirubin Total: 0.3 mg/dL (ref 0.0–1.2)
Bilirubin, Direct: 0.12 mg/dL (ref 0.00–0.40)
Total Protein: 6.9 g/dL (ref 6.0–8.5)

## 2020-05-14 DIAGNOSIS — J301 Allergic rhinitis due to pollen: Secondary | ICD-10-CM | POA: Diagnosis not present

## 2020-05-14 DIAGNOSIS — J3089 Other allergic rhinitis: Secondary | ICD-10-CM | POA: Diagnosis not present

## 2020-05-20 ENCOUNTER — Ambulatory Visit: Payer: PPO | Admitting: Internal Medicine

## 2020-05-29 ENCOUNTER — Other Ambulatory Visit: Payer: Self-pay

## 2020-05-29 ENCOUNTER — Ambulatory Visit: Payer: PPO | Admitting: Pulmonary Disease

## 2020-05-29 ENCOUNTER — Encounter: Payer: Self-pay | Admitting: Pulmonary Disease

## 2020-05-29 VITALS — BP 126/74 | HR 88 | Temp 97.6°F | Ht 64.0 in | Wt 139.0 lb

## 2020-05-29 DIAGNOSIS — K449 Diaphragmatic hernia without obstruction or gangrene: Secondary | ICD-10-CM

## 2020-05-29 DIAGNOSIS — R0982 Postnasal drip: Secondary | ICD-10-CM

## 2020-05-29 DIAGNOSIS — K219 Gastro-esophageal reflux disease without esophagitis: Secondary | ICD-10-CM | POA: Diagnosis not present

## 2020-05-29 DIAGNOSIS — J4520 Mild intermittent asthma, uncomplicated: Secondary | ICD-10-CM | POA: Diagnosis not present

## 2020-05-29 DIAGNOSIS — R059 Cough, unspecified: Secondary | ICD-10-CM

## 2020-05-29 MED ORDER — AZELASTINE HCL 0.15 % NA SOLN
2.0000 | Freq: Two times a day (BID) | NASAL | 0 refills | Status: DC
Start: 2020-05-29 — End: 2020-12-07

## 2020-05-29 MED ORDER — IPRATROPIUM BROMIDE 0.03 % NA SOLN: 2.0000 | Freq: Two times a day (BID) | NASAL | 12 refills | Status: AC

## 2020-05-29 NOTE — Progress Notes (Unsigned)
Synopsis: Referred in January 2022 by Pricilla Holm, MD for Chronic Cough  Subjective:   PATIENT ID: Sheri Andrews GENDER: female DOB: 1950-11-09, MRN: [id number]   HPI  Chief Complaint  Patient presents with  . Cough   Sheri Andrews is a 70 year old woman, never smoker with history of allergic rhinitis, asthma and GERD who is referred to pulmonary clinic for chronic cough.   She presented to the [facility] ER on 04/16/20 with sinus pressure, headaches, cough and sore throat. She tested negative for Covid 19. Chest radiograph was unremarkable as well as BMP and CBC. She was provided with azelastine nasal spray and discharged home. She complains of cough since her ER visit that is dry. She has congestion with post-nasal drip. She has some sinus congestion and ear discomfort. She has shortness of breath with exertion and with coughing episodes. She has history of hiatal hernia and is on pantoprazole and famotidine for GERD. She reports a substernal chest discomfort that is not worse with exertion or associated with nausea or diaphoresis.   She is taking symbicort 160-4.6mcg 2 puffs twice daily and does not complain of frequent wheezing. She is on montelukast for asthma and allergies. She is on flonase daily.   She has seen Dr. Wilburn Cornelia in ENT perviously for pansinusitis, last seen in 06/2018.          Past Medical History:  Diagnosis Date  . Acid reflux disease   . Allergic rhinitis   . Anxiety   . Asthma   . Barrett esophagus   . Dyspnea   . Dysthymia   . Hypercholesterolemia   . Hypertension   . Irritable bowel syndrome   . Nephrolithiasis   . Osteoporosis   . Other chest pain   . Status post dilation of esophageal narrowing      Family History  Problem Relation Age of Onset  . Heart disease Father   . Heart disease Mother   . Diabetes Mother   . Diabetes Sister   . Diabetes Brother   . Cancer Maternal Uncle        type unknown  . Throat cancer Paternal Uncle   .  Heart attack Sister   . Colon cancer Neg Hx   . Stomach cancer Neg Hx   . Rectal cancer Neg Hx   . Pancreatic cancer Neg Hx      Social History   Socioeconomic History  . Marital status: Married    Spouse name: Not on file  . Number of children: 2  . Years of education: Not on file  . Highest education level: Not on file  Occupational History  . Occupation: retired  Tobacco Use  . Smoking status: Never Smoker  . Smokeless tobacco: Never Used  Vaping Use  . Vaping Use: Never used  Substance and Sexual Activity  . Alcohol use: Yes    Alcohol/week: 3.0 standard drinks    Types: 3 Standard drinks or equivalent per week    Comment: social  . Drug use: No  . Sexual activity: Yes    Partners: Male  Other Topics Concern  . Not on file  Social History Narrative  . Not on file   Social Determinants of Health   Financial Resource Strain: Not on file  Food Insecurity: Not on file  Transportation Needs: Not on file  Physical Activity: Not on file  Stress: Not on file  Social Connections: Not on file  Intimate Partner Violence: Not on file  Allergies  Allergen Reactions  . Bupropion Hcl Swelling    WELLBUTRIN  . Doxycycline Hyclate Other (See Comments)    upset  stomach  . Erythromycin Ethylsuccinate Other (See Comments)     upset stomach     Outpatient Medications Prior to Visit  Medication Sig Dispense Refill  . amLODipine (NORVASC) 10 MG tablet Take 1 tablet (10 mg total) by mouth daily. 90 tablet 3  . Artificial Tear Solution (TEARS NATURALE OP) Place 1 drop into both eyes See admin instructions. Use 1 drop into both eyes (scheduled) in the morning & may use 3 times daily if needed for dry /irritated eyes.    Marland Kitchen atorvastatin (LIPITOR) 40 MG tablet Take 1 tablet (40 mg total) by mouth daily. 90 tablet 3  . azelastine (OPTIVAR) 0.05 % ophthalmic solution Place 1 drop into both eyes daily as needed for allergies.    . budesonide-formoterol (SYMBICORT) 160-4.5  MCG/ACT inhaler Inhale 2 puffs into the lungs 2 (two) times daily.    . cholecalciferol (VITAMIN D) 25 MCG (1000 UT) tablet Take 1,000 Units by mouth at bedtime.    . ciclopirox (PENLAC) 8 % solution Apply topically at bedtime. Apply over nail and surrounding skin. Apply daily over previous coat. After seven (7) days, may remove with alcohol and continue cycle. 6.6 mL 6  . clonazePAM (KLONOPIN) 0.25 MG disintegrating tablet DISSOLVE ONE TABLET BY MOUTH EVERY DAY AS NEEDED 30 tablet 0  . EPIPEN 2-PAK 0.3 MG/0.3ML SOAJ injection Inject 0.3 mg into the muscle as needed for anaphylaxis.   1  . estrogen, conjugated,-medroxyprogesterone (PREMPRO) 0.45-1.5 MG per tablet Take 1 tablet by mouth at bedtime.     . famotidine (PEPCID) 40 MG tablet Take 1 tablet (40 mg total) by mouth daily. 90 tablet 3  . FLUoxetine (PROZAC) 40 MG capsule Take 1 capsule (40 mg total) by mouth daily. 90 capsule 3  . fluticasone (FLONASE) 50 MCG/ACT nasal spray Place 2 sprays into both nostrils daily.    . montelukast (SINGULAIR) 10 MG tablet Take 1 tablet (10 mg total) by mouth at bedtime. 90 tablet 3  . Olopatadine HCl 0.7 % SOLN Place 1 drop into both eyes daily as needed (allergies.).     Marland Kitchen pantoprazole (PROTONIX) 40 MG tablet Take 1 tablet (40 mg total) by mouth daily. 180 tablet 3  . polyethylene glycol (MIRALAX / GLYCOLAX) 17 g packet Take 17 g by mouth daily as needed for mild constipation.     . Probiotic Product (PROBIOTIC COLON SUPPORT PO) Take 1 capsule by mouth at bedtime.     . Azelastine HCl 0.15 % SOLN Place 2 sprays into the nose in the morning and at bedtime. 30 mL 0  . fluconazole (DIFLUCAN) 150 MG tablet Take 150 mg by mouth as directed. (Patient not taking: Reported on 05/29/2020)    . amoxicillin-clavulanate (AUGMENTIN) 875-125 MG tablet SMARTSIG:1 Tablet(s) By Mouth Every 12 Hours (Patient not taking: Reported on 05/29/2020)     No facility-administered medications prior to visit.    Review of Systems   Constitutional: Negative for chills, fever, malaise/fatigue and weight loss.  HENT: Positive for congestion. Negative for sinus pain and sore throat.   Eyes: Negative.   Respiratory: Positive for cough and shortness of breath. Negative for hemoptysis, sputum production and wheezing.   Cardiovascular: Negative for chest pain, palpitations, orthopnea, claudication and leg swelling.  Gastrointestinal: Positive for heartburn. Negative for abdominal pain, nausea and vomiting.  Genitourinary: Negative.   Musculoskeletal: Negative  for joint pain and myalgias.  Skin: Negative for rash.  Neurological: Negative for weakness.  Endo/Heme/Allergies: Negative.   Psychiatric/Behavioral: Negative.     Objective:   Vitals:   05/29/20 1502  BP: 126/74  Pulse: 88  Temp: 97.6 F (36.4 C)  SpO2: 98%  Weight: 139 lb (63 kg)  Height: 5\' 4"  (1.626 m)     Physical Exam Constitutional:      General: She is not in acute distress.    Appearance: Normal appearance. She is not ill-appearing.  HENT:     Head: Normocephalic and atraumatic.  Eyes:     General: No scleral icterus.    Conjunctiva/sclera: Conjunctivae normal.     Pupils: Pupils are equal, round, and reactive to light.  Cardiovascular:     Rate and Rhythm: Normal rate and regular rhythm.     Pulses: Normal pulses.     Heart sounds: Normal heart sounds. No murmur heard.   Pulmonary:     Effort: Pulmonary effort is normal.     Breath sounds: Normal breath sounds. No wheezing, rhonchi or rales.  Abdominal:     General: Bowel sounds are normal.     Palpations: Abdomen is soft.  Musculoskeletal:     Right lower leg: No edema.     Left lower leg: No edema.  Lymphadenopathy:     Cervical: No cervical adenopathy.  Skin:    General: Skin is warm and dry.  Neurological:     General: No focal deficit present.     Mental Status: She is alert.  Psychiatric:        Mood and Affect: Mood normal.        Behavior: Behavior normal.         Thought Content: Thought content normal.        Judgment: Judgment normal.     CBC    Component Value Date/Time   WBC 6.6 04/16/2020 0801   RBC 4.00 04/16/2020 0801   HGB 12.7 04/16/2020 0801   HCT 37.6 04/16/2020 0801   PLT 213 04/16/2020 0801   MCV 94.0 04/16/2020 0801   MCH 31.8 04/16/2020 0801   MCHC 33.8 04/16/2020 0801   RDW 13.2 04/16/2020 0801   LYMPHSABS 1.5 04/16/2020 0801   MONOABS 0.7 04/16/2020 0801   EOSABS 0.0 04/16/2020 0801   BASOSABS 0.0 04/16/2020 0801   BMP Latest Ref Rng & Units 04/16/2020 11/24/2019 09/06/2018  Glucose 70 - 99 mg/dL 106(H) 86 95  BUN 8 - 23 mg/dL 14 16 16   Creatinine 0.44 - 1.00 mg/dL 0.84 0.99 0.85  BUN/Creat Ratio 6 - 22 (calc) - NOT APPLICABLE -  Sodium 528 - 145 mmol/L 139 141 137  Potassium 3.5 - 5.1 mmol/L 3.7 4.1 3.3(L)  Chloride 98 - 111 mmol/L 103 106 102  CO2 22 - 32 mmol/L 25 24 27   Calcium 8.9 - 10.3 mg/dL 9.2 9.4 9.3   Chest imaging: CXR 04/16/20 The heart size and mediastinal contours are within normal limits. Both lungs are clear. No pleural effusion. The visualized skeletal structures are unremarkable.  PFT: No flowsheet data found.  Labs:  Path:  Echo:  Heart Catheterization:       Assessment & Plan:   Mild intermittent asthma without complication  Post-nasal drainage - Plan: Ambulatory referral to ENT  Gastroesophageal reflux disease without esophagitis  Hiatal hernia  Cough - Plan: Ambulatory referral to ENT  Discussion: Sheri Andrews is a 70 year old woman, never smoker with history of allergic  rhinitis, asthma and GERD who is referred to pulmonary clinic for chronic cough.   She has upper airway cough syndrome with involvement from sinus congestion and drainage along with involvement from GERD with underlying hiatal hernia.  We will continue her on azelastine nasal spray that was started on 04/16/20 from her recent ER visit and we will add ipratropium nasal spray at bedtime. She is to  continue flonase daily. We will refer her back to Dr. Wilburn Cornelia for further evaluation and treatment.   For GERD, she is on PPI therapy and famotidine daily. She is to elevate the head of her bed to further prevent nocturnal reflux.   Her asthma appears well controlled on the Symbicort twice daily.  Follow up in 3 months.  Freda Jackson, MD [facility] Office: [phone]   See Amion for Pager Details      Current Outpatient Medications:  .  amLODipine (NORVASC) 10 MG tablet, Take 1 tablet (10 mg total) by mouth daily., Disp: 90 tablet, Rfl: 3 .  Artificial Tear Solution (TEARS NATURALE OP), Place 1 drop into both eyes See admin instructions. Use 1 drop into both eyes (scheduled) in the morning & may use 3 times daily if needed for dry /irritated eyes., Disp: , Rfl:  .  atorvastatin (LIPITOR) 40 MG tablet, Take 1 tablet (40 mg total) by mouth daily., Disp: 90 tablet, Rfl: 3 .  azelastine (OPTIVAR) 0.05 % ophthalmic solution, Place 1 drop into both eyes daily as needed for allergies., Disp: , Rfl:  .  budesonide-formoterol (SYMBICORT) 160-4.5 MCG/ACT inhaler, Inhale 2 puffs into the lungs 2 (two) times daily., Disp: , Rfl:  .  cholecalciferol (VITAMIN D) 25 MCG (1000 UT) tablet, Take 1,000 Units by mouth at bedtime., Disp: , Rfl:  .  ciclopirox (PENLAC) 8 % solution, Apply topically at bedtime. Apply over nail and surrounding skin. Apply daily over previous coat. After seven (7) days, may remove with alcohol and continue cycle., Disp: 6.6 mL, Rfl: 6 .  clonazePAM (KLONOPIN) 0.25 MG disintegrating tablet, DISSOLVE ONE TABLET BY MOUTH EVERY DAY AS NEEDED, Disp: 30 tablet, Rfl: 0 .  EPIPEN 2-PAK 0.3 MG/0.3ML SOAJ injection, Inject 0.3 mg into the muscle as needed for anaphylaxis. , Disp: , Rfl: 1 .  estrogen, conjugated,-medroxyprogesterone (PREMPRO) 0.45-1.5 MG per tablet, Take 1 tablet by mouth at bedtime. , Disp: , Rfl:  .  famotidine (PEPCID) 40 MG tablet, Take 1  tablet (40 mg total) by mouth daily., Disp: 90 tablet, Rfl: 3 .  FLUoxetine (PROZAC) 40 MG capsule, Take 1 capsule (40 mg total) by mouth daily., Disp: 90 capsule, Rfl: 3 .  fluticasone (FLONASE) 50 MCG/ACT nasal spray, Place 2 sprays into both nostrils daily., Disp: , Rfl:  .  ipratropium (ATROVENT) 0.03 % nasal spray, Place 2 sprays into both nostrils every 12 (twelve) hours., Disp: 30 mL, Rfl: 12 .  montelukast (SINGULAIR) 10 MG tablet, Take 1 tablet (10 mg total) by mouth at bedtime., Disp: 90 tablet, Rfl: 3 .  Olopatadine HCl 0.7 % SOLN, Place 1 drop into both eyes daily as needed (allergies.). , Disp: , Rfl:  .  pantoprazole (PROTONIX) 40 MG tablet, Take 1 tablet (40 mg total) by mouth daily., Disp: 180 tablet, Rfl: 3 .  polyethylene glycol (MIRALAX / GLYCOLAX) 17 g packet, Take 17 g by mouth daily as needed for mild constipation. , Disp: , Rfl:  .  Probiotic Product (PROBIOTIC COLON SUPPORT PO), Take 1 capsule by mouth at  bedtime. , Disp: , Rfl:  .  Azelastine HCl 0.15 % SOLN, Place 2 sprays into the nose in the morning and at bedtime., Disp: 30 mL, Rfl: 0 .  fluconazole (DIFLUCAN) 150 MG tablet, Take 150 mg by mouth as directed. (Patient not taking: Reported on 05/29/2020), Disp: , Rfl:

## 2020-05-29 NOTE — Patient Instructions (Signed)
Your cough is likely secondary to upper airway cough syndrome involving sinus drainage and reflux disease  Continue flonase daily  Use azelastine nasal spray twice daily  Use ipratropium nasal spray at bedtime (can be used twice daily if needed)  Continue symbicort daily and as needed albuterol  We will place referral to Dr. Wilburn Cornelia in ENT.

## 2020-05-30 ENCOUNTER — Telehealth: Payer: PPO

## 2020-05-30 ENCOUNTER — Encounter: Payer: Self-pay | Admitting: Pulmonary Disease

## 2020-06-05 DIAGNOSIS — J3089 Other allergic rhinitis: Secondary | ICD-10-CM | POA: Diagnosis not present

## 2020-06-05 DIAGNOSIS — J301 Allergic rhinitis due to pollen: Secondary | ICD-10-CM | POA: Diagnosis not present

## 2020-06-07 DIAGNOSIS — J301 Allergic rhinitis due to pollen: Secondary | ICD-10-CM | POA: Diagnosis not present

## 2020-06-07 DIAGNOSIS — J3089 Other allergic rhinitis: Secondary | ICD-10-CM | POA: Diagnosis not present

## 2020-06-27 DIAGNOSIS — L080 Pyoderma: Secondary | ICD-10-CM | POA: Diagnosis not present

## 2020-06-27 DIAGNOSIS — L821 Other seborrheic keratosis: Secondary | ICD-10-CM | POA: Diagnosis not present

## 2020-06-27 DIAGNOSIS — Z85828 Personal history of other malignant neoplasm of skin: Secondary | ICD-10-CM | POA: Diagnosis not present

## 2020-06-27 DIAGNOSIS — D485 Neoplasm of uncertain behavior of skin: Secondary | ICD-10-CM | POA: Diagnosis not present

## 2020-06-27 DIAGNOSIS — L570 Actinic keratosis: Secondary | ICD-10-CM | POA: Diagnosis not present

## 2020-06-27 DIAGNOSIS — D1801 Hemangioma of skin and subcutaneous tissue: Secondary | ICD-10-CM | POA: Diagnosis not present

## 2020-06-28 DIAGNOSIS — J3089 Other allergic rhinitis: Secondary | ICD-10-CM | POA: Diagnosis not present

## 2020-06-28 DIAGNOSIS — J301 Allergic rhinitis due to pollen: Secondary | ICD-10-CM | POA: Diagnosis not present

## 2020-06-28 DIAGNOSIS — N644 Mastodynia: Secondary | ICD-10-CM | POA: Diagnosis not present

## 2020-07-18 ENCOUNTER — Other Ambulatory Visit: Payer: Self-pay

## 2020-07-18 ENCOUNTER — Encounter: Payer: Self-pay | Admitting: Internal Medicine

## 2020-07-18 ENCOUNTER — Ambulatory Visit (INDEPENDENT_AMBULATORY_CARE_PROVIDER_SITE_OTHER): Payer: PPO | Admitting: Internal Medicine

## 2020-07-18 VITALS — BP 116/74 | HR 90 | Temp 98.5°F | Ht 64.0 in | Wt 141.2 lb

## 2020-07-18 DIAGNOSIS — M79662 Pain in left lower leg: Secondary | ICD-10-CM

## 2020-07-18 NOTE — Progress Notes (Signed)
   Subjective:   Patient ID: Sheri Andrews, female    DOB: 19-Jan-1951, 70 y.o.   MRN: [id number]  HPI The patient is a 70 YO female coming in for leg pain. Started about 2 weeks ago without cause. Denies swelling in the leg. She is concerned as she had covid-19 Dec/Jan. Overall it is not improving. Pain in left leg in the shin and calf region. Sore to touch at times. No redness. Now is starting to move up to the thigh/groin region at times. Has tried otc without relief.   Review of Systems  Constitutional: Negative.   HENT: Negative.   Eyes: Negative.   Respiratory: Negative for cough, chest tightness and shortness of breath.   Cardiovascular: Negative for chest pain, palpitations and leg swelling.  Gastrointestinal: Negative for abdominal distention, abdominal pain, constipation, diarrhea, nausea and vomiting.  Musculoskeletal: Positive for arthralgias and myalgias.  Skin: Negative.   Neurological: Negative.   Psychiatric/Behavioral: Negative.     Objective:  Physical Exam Constitutional:      Appearance: She is well-developed.  HENT:     Head: Normocephalic and atraumatic.  Cardiovascular:     Rate and Rhythm: Normal rate and regular rhythm.  Pulmonary:     Effort: Pulmonary effort is normal. No respiratory distress.     Breath sounds: Normal breath sounds. No wheezing or rales.  Abdominal:     General: Bowel sounds are normal. There is no distension.     Palpations: Abdomen is soft.     Tenderness: There is no abdominal tenderness. There is no rebound.  Musculoskeletal:        General: Tenderness present.     Cervical back: Normal range of motion.     Comments: Tenderness left calf and shin to touch, calf circumference is same left to right, no redness or rash on the leg  Skin:    General: Skin is warm and dry.  Neurological:     Mental Status: She is alert and oriented to person, place, and time.     Coordination: Coordination normal.     Vitals:   07/18/20 1610   BP: 116/74  Pulse: 90  Temp: 98.5 F (36.9 C)  TempSrc: Oral  SpO2: 96%  Weight: 141 lb 3.2 oz (64 kg)  Height: 5\' 4"  (1.626 m)    This visit occurred during the SARS-CoV-2 public health emergency.  Safety protocols were in place, including screening questions prior to the visit, additional usage of staff PPE, and extensive cleaning of exam room while observing appropriate contact time as indicated for disinfecting solutions.   Assessment & Plan:

## 2020-07-18 NOTE — Patient Instructions (Signed)
We will get the ultrasound of the legs to check for blood clots.   If you do not have a clot we could try decreasing the dose of the cholesterol medicine to see if that helps.

## 2020-07-19 ENCOUNTER — Ambulatory Visit (HOSPITAL_COMMUNITY)
Admission: RE | Admit: 2020-07-19 | Discharge: 2020-07-19 | Disposition: A | Discharge status: Home / Self Care | Payer: PPO | Source: Ambulatory Visit | Attending: Internal Medicine | Admitting: Internal Medicine

## 2020-07-19 DIAGNOSIS — M79662 Pain in left lower leg: Secondary | ICD-10-CM | POA: Insufficient documentation

## 2020-07-19 NOTE — Assessment & Plan Note (Signed)
Checking [place] to rule out DVT and baker's cyst.

## 2020-07-24 DIAGNOSIS — J301 Allergic rhinitis due to pollen: Secondary | ICD-10-CM | POA: Diagnosis not present

## 2020-07-24 DIAGNOSIS — J3089 Other allergic rhinitis: Secondary | ICD-10-CM | POA: Diagnosis not present

## 2020-07-30 ENCOUNTER — Encounter: Payer: Self-pay | Admitting: Internal Medicine

## 2020-07-30 DIAGNOSIS — N644 Mastodynia: Secondary | ICD-10-CM | POA: Diagnosis not present

## 2020-09-02 ENCOUNTER — Telehealth: Payer: Self-pay | Admitting: Internal Medicine

## 2020-09-02 NOTE — Telephone Encounter (Addendum)
   Patient reporting poison oak/ ivy on arm and legs She is requesting Prednisone and Cortisone be prescribed  Please advise  [facility] [place], [place] AT [place]

## 2020-09-03 NOTE — Telephone Encounter (Signed)
Spoke to patient concerning the poison oak/ivy on the arm and legs. Patient was requesting Prednisone and Cortisone. Appointment made for 09/04/2020 at 9:20 am with Dr [name].

## 2020-09-04 ENCOUNTER — Other Ambulatory Visit: Payer: Self-pay

## 2020-09-04 ENCOUNTER — Encounter: Payer: Self-pay | Admitting: Internal Medicine

## 2020-09-04 ENCOUNTER — Ambulatory Visit (INDEPENDENT_AMBULATORY_CARE_PROVIDER_SITE_OTHER): Payer: PPO | Admitting: Internal Medicine

## 2020-09-04 VITALS — BP 132/80 | HR 97 | Temp 98.4°F | Resp 18 | Ht 64.0 in | Wt 141.8 lb

## 2020-09-04 DIAGNOSIS — R21 Rash and other nonspecific skin eruption: Secondary | ICD-10-CM

## 2020-09-04 DIAGNOSIS — E7800 Pure hypercholesterolemia, unspecified: Secondary | ICD-10-CM

## 2020-09-04 LAB — COMPREHENSIVE METABOLIC PANEL
ALT: 16 U/L (ref 0–35)
AST: 20 U/L (ref 0–37)
Albumin: 4.4 g/dL (ref 3.5–5.2)
Alkaline Phosphatase: 41 U/L (ref 39–117)
BUN: 18 mg/dL (ref 6–23)
CO2: 26 mEq/L (ref 19–32)
Calcium: 9.4 mg/dL (ref 8.4–10.5)
Chloride: 105 mEq/L (ref 96–112)
Creatinine, Ser: 0.93 mg/dL (ref 0.40–1.20)
GFR: 62.3 mL/min (ref >60.00)
Glucose, Bld: 106 mg/dL — ABNORMAL HIGH (ref 70–99)
Potassium: 3.6 mEq/L (ref 3.5–5.1)
Sodium: 139 mEq/L (ref 135–145)
Total Bilirubin: 0.5 mg/dL (ref 0.2–1.2)
Total Protein: 7.4 g/dL (ref 6.0–8.3)

## 2020-09-04 LAB — LIPID PANEL
Cholesterol: 158 mg/dL (ref 0–200)
HDL: 39.5 mg/dL (ref >39.00)
LDL Cholesterol: 94 mg/dL (ref 0–99)
NonHDL: 118.79
Total CHOL/HDL Ratio: 4
Triglycerides: 124 mg/dL (ref 0.0–149.0)
VLDL: 24.8 mg/dL (ref 0.0–40.0)

## 2020-09-04 MED ORDER — TRIAMCINOLONE ACETONIDE 0.1 % EX CREA: 1.0000 "application " | TOPICAL_CREAM | Freq: Two times a day (BID) | CUTANEOUS | 0 refills | Status: DC

## 2020-09-04 MED ORDER — PREDNISONE 20 MG PO TABS: 40.0000 mg | ORAL_TABLET | Freq: Every day | ORAL | 0 refills | Status: DC

## 2020-09-04 MED ORDER — METHYLPREDNISOLONE ACETATE 40 MG/ML IJ SUSP
40.0000 mg | Freq: Once | INTRAMUSCULAR | Status: AC
  Administered 2020-09-04: 40 mg via INTRAMUSCULAR

## 2020-09-04 NOTE — Progress Notes (Signed)
   Subjective:   Patient ID: Sheri Andrews, female    DOB: January 11, 1951, 70 y.o.   MRN: [id number]  HPI The patient is a 70 YO female coming in for poison ivy. Started Monday and is spreading. Tried otc creams without relief.   Review of Systems  Constitutional: Negative.   HENT: Negative.   Eyes: Negative.   Respiratory: Negative for cough, chest tightness and shortness of breath.   Cardiovascular: Negative for chest pain, palpitations and leg swelling.  Gastrointestinal: Negative for abdominal distention, abdominal pain, constipation, diarrhea, nausea and vomiting.  Musculoskeletal: Negative.   Skin: Positive for rash.  Neurological: Negative.   Psychiatric/Behavioral: Negative.     Objective:  Physical Exam Constitutional:      Appearance: She is well-developed.  HENT:     Head: Normocephalic and atraumatic.  Cardiovascular:     Rate and Rhythm: Normal rate and regular rhythm.  Pulmonary:     Effort: Pulmonary effort is normal. No respiratory distress.     Breath sounds: Normal breath sounds. No wheezing or rales.  Abdominal:     General: Bowel sounds are normal. There is no distension.     Palpations: Abdomen is soft.     Tenderness: There is no abdominal tenderness. There is no rebound.  Musculoskeletal:     Cervical back: Normal range of motion.  Skin:    General: Skin is warm and dry.     Findings: Rash present.  Neurological:     Mental Status: She is alert and oriented to person, place, and time.     Coordination: Coordination normal.     Vitals:   09/04/20 0926  BP: 132/80  Pulse: 97  Resp: 18  Temp: 98.4 F (36.9 C)  TempSrc: Oral  SpO2: 97%  Weight: 141 lb 12.8 oz (64.3 kg)  Height: 5\' 4"  (1.626 m)    This visit occurred during the SARS-CoV-2 public health emergency.  Safety protocols were in place, including screening questions prior to the visit, additional usage of staff PPE, and extensive cleaning of exam room while observing appropriate  contact time as indicated for disinfecting solutions.   Assessment & Plan:  Depo-medrol 40 mg IM given at visit

## 2020-09-04 NOTE — Patient Instructions (Addendum)
We have given you the cream and prednisone with the shot today.  Zyrtec can help with itching take 1 pill 3 times a day.   We will check the cholesterol today.

## 2020-09-06 ENCOUNTER — Telehealth: Payer: Self-pay | Admitting: Internal Medicine

## 2020-09-06 DIAGNOSIS — R21 Rash and other nonspecific skin eruption: Secondary | ICD-10-CM | POA: Insufficient documentation

## 2020-09-06 NOTE — Telephone Encounter (Signed)
Patient calling to make [place] aware she was seen on Wednesday in the office and her husband tested positive for covid yesterday. Not requesting an appointment at this time.

## 2020-09-06 NOTE — Assessment & Plan Note (Signed)
Given depo-medrol 40 mg IM at visit and rx triamcinolone ointment. Rx prednisone to take if no improvement.

## 2020-09-06 NOTE — Assessment & Plan Note (Signed)
Needs follow up lipid panel and LFTs. Taking lipitor 40 mg daily.

## 2020-09-06 NOTE — Telephone Encounter (Signed)
See below

## 2020-09-17 ENCOUNTER — Telehealth (INDEPENDENT_AMBULATORY_CARE_PROVIDER_SITE_OTHER): Payer: PPO | Admitting: Family Medicine

## 2020-09-17 ENCOUNTER — Encounter: Payer: Self-pay | Admitting: Family Medicine

## 2020-09-17 DIAGNOSIS — U071 COVID-19: Secondary | ICD-10-CM

## 2020-09-17 MED ORDER — BENZONATATE 100 MG PO CAPS: 100.0000 mg | ORAL_CAPSULE | Freq: Three times a day (TID) | ORAL | 0 refills | Status: DC | PRN

## 2020-09-17 MED ORDER — MOLNUPIRAVIR EUA 200MG CAPSULE: 4.0000 | ORAL_CAPSULE | Freq: Two times a day (BID) | ORAL | 0 refills | Status: AC

## 2020-09-17 NOTE — Progress Notes (Signed)
Virtual Visit via Video Note  I connected with Sheri Andrews  on 09/17/20 at  6:00 PM EDT by a video enabled telemedicine application and verified that I am speaking with the correct person using two identifiers.  Location patient: home,  Location provider:work or home office Persons participating in the virtual visit: patient, provider  I discussed the limitations of evaluation and management by telemedicine and the availability of in person appointments. The patient expressed understanding and agreed to proceed.   HPI:  Acute telemedicine visit for Covid19: -Onset: 4-5 days ago; positive covid test at home -husband had covid last week -Symptoms include: nasal congestion, headache, cough mild, sinus headache -needs refill on her albeterol -Denies: fevers, CP, SOB, NVD, inability to eat/drink/get out of bed -Pertinent past medical history: allergic rhinitis, asthma -Pertinent medication allergies:  Allergies  Allergen Reactions  . Bupropion Hcl Swelling    WELLBUTRIN  . Doxycycline Hyclate Other (See Comments)    upset  stomach  . Erythromycin Ethylsuccinate Other (See Comments)     upset stomach  -COVID-19 vaccine status: vaccinated x 2 and had booster  ROS: See pertinent positives and negatives per HPI.  Past Medical History:  Diagnosis Date  . Acid reflux disease   . Allergic rhinitis   . Anxiety   . Asthma   . Barrett esophagus   . Dyspnea   . Dysthymia   . Hypercholesterolemia   . Hypertension   . Irritable bowel syndrome   . Nephrolithiasis   . Osteoporosis   . Other chest pain   . Status post dilation of esophageal narrowing     Past Surgical History:  Procedure Laterality Date  . ESOPHAGOGASTRODUODENOSCOPY (EGD) WITH PROPOFOL N/A 05/02/2019   Procedure: ESOPHAGOGASTRODUODENOSCOPY (EGD) WITH PROPOFOL;  Surgeon: Mauri Pole, MD;  Location: [facility];  Service: Endoscopy;  Laterality: N/A;  needing APC available  . FACIAL COSMETIC SURGERY    . 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 TUBAL LIGATION  1986     Current Outpatient Medications:  .  amLODipine (NORVASC) 10 MG tablet, Take 1 tablet (10 mg total) by mouth daily., Disp: 90 tablet, Rfl: 3 .  Artificial Tear Solution (TEARS NATURALE OP), Place 1 drop into both eyes See admin instructions. Use 1 drop into both eyes (scheduled) in the morning & may use 3 times daily if needed for dry /irritated eyes., Disp: , Rfl:  .  atorvastatin (LIPITOR) 40 MG tablet, Take 1 tablet (40 mg total) by mouth daily., Disp: 90 tablet, Rfl: 3 .  azelastine (OPTIVAR) 0.05 % ophthalmic solution, Place 1 drop into both eyes daily as needed for allergies., Disp: , Rfl:  .  Azelastine HCl 0.15 % SOLN, Place 2 sprays into the nose in the morning and at bedtime., Disp: 30 mL, Rfl: 0 .  benzonatate (TESSALON PERLES) 100 MG capsule, Take 1 capsule (100 mg total) by mouth 3 (three) times daily as needed., Disp: 20 capsule, Rfl: 0 .  budesonide-formoterol (SYMBICORT) 160-4.5 MCG/ACT inhaler, Inhale 2 puffs into the lungs 2 (two) times daily., Disp: , Rfl:  .  cholecalciferol (VITAMIN D) 25 MCG (1000 UT) tablet, Take 1,000 Units by mouth at bedtime., Disp: , Rfl:  .  clonazePAM (KLONOPIN) 0.25 MG disintegrating tablet, DISSOLVE ONE TABLET BY MOUTH EVERY DAY AS NEEDED, Disp: 30 tablet, Rfl: 0 .  EPIPEN 2-PAK 0.3 MG/0.3ML SOAJ injection, Inject 0.3 mg into the muscle as needed for anaphylaxis. , Disp: , Rfl: 1 .  estrogen, conjugated,-medroxyprogesterone (PREMPRO) 0.45-1.5 MG per tablet, Take 1 tablet by mouth at bedtime. , Disp: , Rfl:  .  famotidine (PEPCID) 40 MG tablet, Take 1 tablet (40 mg total) by mouth daily., Disp: 90 tablet, Rfl: 3 .  FLUoxetine (PROZAC) 40 MG capsule, Take 1 capsule (40 mg total) by mouth daily., Disp: 90 capsule, Rfl: 3 .  fluticasone (FLONASE) 50 MCG/ACT nasal spray, Place 2 sprays into both nostrils daily., Disp: , Rfl:  .  ipratropium (ATROVENT) 0.03 % nasal spray, Place 2  sprays into both nostrils every 12 (twelve) hours., Disp: 30 mL, Rfl: 12 .  molnupiravir EUA 200 mg CAPS, Take 4 capsules (800 mg total) by mouth 2 (two) times daily for 5 days., Disp: 40 capsule, Rfl: 0 .  montelukast (SINGULAIR) 10 MG tablet, Take 1 tablet (10 mg total) by mouth at bedtime., Disp: 90 tablet, Rfl: 3 .  Olopatadine HCl 0.7 % SOLN, Place 1 drop into both eyes daily as needed (allergies.). , Disp: , Rfl:  .  pantoprazole (PROTONIX) 40 MG tablet, Take 1 tablet (40 mg total) by mouth daily., Disp: 180 tablet, Rfl: 3 .  polyethylene glycol (MIRALAX / GLYCOLAX) 17 g packet, Take 17 g by mouth daily as needed for mild constipation. , Disp: , Rfl:  .  Probiotic Product (PROBIOTIC COLON SUPPORT PO), Take 1 capsule by mouth at bedtime. , Disp: , Rfl:  .  triamcinolone cream (KENALOG) 0.1 %, Apply 1 application topically 2 (two) times daily., Disp: 100 g, Rfl: 0  EXAM:  VITALS per patient if applicable:  GENERAL: alert, oriented, appears well and in no acute distress  HEENT: atraumatic, conjunttiva clear, no obvious abnormalities on inspection of external nose and ears  NECK: normal movements of the head and neck  LUNGS: on inspection no signs of respiratory distress, breathing rate appears normal, no obvious gross SOB, gasping or wheezing  CV: no obvious cyanosis  MS: moves all visible extremities without noticeable abnormality  PSYCH/NEURO: pleasant and cooperative, no obvious depression or anxiety, speech and thought processing grossly intact  ASSESSMENT AND PLAN:  Discussed the following assessment and plan:  COVID-19   Discussed treatment options, ideal treatment window, potential complications, isolation and precautions for COVID-19.  After lengthy discussion, the patient opted for treatment with molnupiravir due to being higher risk for complications of covid or severe disease and other factors. She opted against paxlovid after discussion of potential drug interactions  and risks.  Discussed EUA status of this drug and the fact that there is preliminary limited knowledge of risks/interactions/side effects per EUA document vs possible benefits and precautions. This information was shared with patient during the visit and also was provided in patient instructions. Also, advised that patient discuss risks/interactions and use with pharmacist/treatment team as well. The patient did want a prescription for cough, Tessalon Rx sent.  Other symptomatic care measures summarized in patient instructions.  Did advise that this drug is available at the [facility] health pharmacies.  However, she preferred that this be sent to [facility] near her, did let her know that I am not sure that they will have this in stock.  She still wanted it to go to that pharmacy. Advised to seek prompt in person care if worsening, new symptoms arise, or if is not improving with treatment. Discussed options for inperson care if PCP office not available. Did let this patient know that I only do telemedicine on Tuesdays and Thursdays for [place]. Advised to schedule follow up visit  with PCP or [facility] if any further questions or concerns to avoid delays in care.   I discussed the assessment and treatment plan with the patient. The patient was provided an opportunity to ask questions and all were answered. The patient agreed with the plan and demonstrated an understanding of the instructions.     Lucretia Kern, DO

## 2020-09-17 NOTE — Patient Instructions (Addendum)
HOME CARE TIPS:  -I sent the medication(s) we discussed to your pharmacy: Meds ordered this encounter  Medications   molnupiravir EUA 200 mg CAPS    Sig: Take 4 capsules (800 mg total) by mouth 2 (two) times daily for 5 days.    Dispense:  40 capsule    Refill:  0   benzonatate (TESSALON PERLES) 100 MG capsule    Sig: Take 1 capsule (100 mg total) by mouth 3 (three) times daily as needed.    Dispense:  20 capsule    Refill:  0     -I sent in the Covid19 treatment or referral you requested per our discussion. Please see the information provided below and discuss further with the pharmacist/treatment team.   -can use tylenol  if needed for fevers, aches and pains per instructions  -can use nasal saline a few times per day if you have nasal congestion; sometimes  a short course of Afrin nasal spray for 3 days can help with symptoms as well  -stay hydrated, drink plenty of fluids and eat small healthy meals - avoid dairy  -If the Covid test is positive, check out the [facility] website for more information on home care, transmission and treatment for COVID19  -follow up with your doctor in 2-3 days unless improving and feeling better  -stay home while sick, except to seek medical care. If you have COVID19, ideally it would be best to stay home for a full 10 days since the onset of symptoms PLUS one day of no fever and feeling better. Wear a good mask that fits snugly (such as N95 or KN95) if around others to reduce the risk of transmission.  It was nice to meet you today, and I really hope you are feeling better soon. I help [facility] out with telemedicine visits on Tuesdays and Thursdays and am available for visits on those days. If you have any concerns or questions following this visit please schedule a follow up visit with your Primary Care doctor or seek care at a local urgent care clinic to avoid delays in care.    Seek in person care or schedule a follow up video visit promptly if  your symptoms worsen, new concerns arise or you are not improving with treatment. Call 911 and/or seek emergency care if your symptoms are severe or life threatening.    Fact Sheet for Patients And Caregivers Emergency Use Authorization (EUA) Of LAGEVRIO (molnupiravir) capsules For Coronavirus Disease 2019 (COVID-19)  What is the most important information I should know about LAGEVRIO? LAGEVRIO may cause serious side effects, including:  LAGEVRIO may cause harm to your unborn baby. It is not known if LAGEVRIO will harm your baby if you take LAGEVRIO during pregnancy. o LAGEVRIO is not recommended for use in pregnancy. o LAGEVRIO has not been studied in pregnancy. LAGEVRIO was studied in pregnant animals only. When LAGEVRIO was given to pregnant animals, LAGEVRIO caused harm to their unborn babies. o You and your healthcare provider may decide that you should take LAGEVRIO during pregnancy if there are no other COVID-19 treatment options approved or authorized by the FDA that are accessible or clinically appropriate for you. o If you and your healthcare provider decide that you should take LAGEVRIO during pregnancy, you and your healthcare provider should discuss the known and potential benefits and the potential risks of taking LAGEVRIO during pregnancy. For individuals who are able to become pregnant:  You should use a reliable method of birth control (contraception)  consistently and correctly during treatment with LAGEVRIO and for 4 days after the last dose of LAGEVRIO. Talk to your healthcare provider about reliable birth control methods.  Before starting treatment with [facility] your healthcare provider may do a pregnancy test to see if you are pregnant before starting treatment with LAGEVRIO.  Tell your healthcare provider right away if you become pregnant or think you may be pregnant during treatment with LAGEVRIO. Pregnancy Surveillance Program:  There is a pregnancy  surveillance program for individuals who take LAGEVRIO during pregnancy. The purpose of this program is to collect information about the health of you and your baby. Talk to your healthcare provider about how to take part in this program.  If you take LAGEVRIO during pregnancy and you agree to participate in the pregnancy surveillance program and allow your healthcare provider to share your information with [employer], then your healthcare provider will report your use of LAGEVRIO during pregnancy to [employer] by calling [phone] or RelayImage.ca. For individuals who are sexually active with partners who are able to become pregnant:  It is not known if LAGEVRIO can affect sperm. While the risk is regarded as low, animal studies to fully assess the potential for LAGEVRIO to affect the babies of males treated with LAGEVRIO have not been completed. A reliable method of birth control (contraception) should be used consistently and correctly during treatment with LAGEVRIO and for at least 3 months after the last dose. The risk to sperm beyond 3 months is not known. Studies to understand the risk to sperm beyond 3 months are ongoing. Talk to your healthcare provider about reliable birth control methods. Talk to your healthcare provider if you have questions or concerns about how LAGEVRIO may affect sperm. You are being given this fact sheet because your healthcare provider believes it is necessary to provide you with LAGEVRIO for the treatment of adults with mild-to-moderate coronavirus disease 2019 (COVID-19) with positive results of direct SARS-CoV-2 viral testing, and who are at high risk for progression to severe COVID-19 including hospitalization or death, and for whom other COVID-19 treatment options approved or authorized by the FDA are not accessible or clinically appropriate. The U.S. Food and Drug Administration (FDA) has issued an Emergency  Use Authorization (EUA) to make LAGEVRIO available during the COVID-19 pandemic (for more details about an EUA please see What is an Emergency Use Authorization? at the end of this document). LAGEVRIO is not an FDA-approved medicine in the [place]. Read this Fact Sheet for information about LAGEVRIO. Talk to your healthcare provider about your options if you have any questions. It is your choice to take LAGEVRIO.  What is COVID-19? COVID-19 is caused by a virus called a coronavirus. You can get COVID-19 through close contact with another person who has the virus. COVID-19 illnesses have ranged from very mild-to-severe, including illness resulting in death. While information so far suggests that most COVID-19 illness is mild, serious illness can happen and may cause some of your other medical conditions to become worse. Older people and people of all ages with severe, long lasting (chronic) medical conditions like heart disease, lung disease and diabetes, for example seem to be at higher risk of being hospitalized for COVID-19.  What is LAGEVRIO? LAGEVRIO is an investigational medicine used to treat mild-to-moderate COVID-19 in adults:  with positive results of direct SARS-CoV-2 viral testing, and  who are at high risk for progression to severe COVID-19 including hospitalization or death, and  for whom other COVID-19 treatment options approved or authorized by the FDA are not accessible or clinically appropriate. The FDA has authorized the emergency use of LAGEVRIO for the treatment of mild-tomoderate COVID-19 in adults under an EUA. For more information on EUA, see the What is an Emergency Use Authorization (EUA)? section at the end of this Fact Sheet. LAGEVRIO is not authorized:  for use in people less than 60 years of age.  for prevention of COVID-19.  for people needing hospitalization for COVID-19.  for use for longer than 5 consecutive days.  What should I tell my  healthcare provider before I take LAGEVRIO? Tell your healthcare provider if you:  Have any allergies  Are breastfeeding or plan to breastfeed  Have any serious illnesses  Are taking any medicines (prescription, over-the-counter, vitamins, or herbal products).  How do I take LAGEVRIO?  Take LAGEVRIO exactly as your healthcare provider tells you to take it.  Take 4 capsules of LAGEVRIO every 12 hours (for example, at 8 am and at 8 pm)  Take LAGEVRIO for 5 days. It is important that you complete the full 5 days of treatment with LAGEVRIO. Do not stop taking LAGEVRIO before you complete the full 5 days of treatment, even if you feel better.  Take LAGEVRIO with or without food.  You should stay in isolation for as long as your healthcare provider tells you to. Talk to your healthcare provider if you are not sure about how to properly isolate while you have COVID-19.  Swallow LAGEVRIO capsules whole. Do not open, break, or crush the capsules. If you cannot swallow capsules whole, tell your healthcare provider.  What to do if you miss a dose: o If it has been less than 10 hours since the missed dose, take it as soon as you remember o If it has been more than 10 hours since the missed dose, skip the missed dose and take your dose at the next scheduled time.  Do not double the dose of LAGEVRIO to make up for a missed dose.  What are the important possible side effects of LAGEVRIO?  See, What is the most important information I should know about LAGEVRIO?  Allergic Reactions. Allergic reactions can happen in people taking LAGEVRIO, even after only 1 dose. Stop taking LAGEVRIO and call your healthcare provider right away if you get any of the following symptoms of an allergic reaction: o hives o rapid heartbeat o trouble swallowing or breathing o swelling of the mouth, lips, or face o throat tightness o hoarseness o skin rash The most common side effects of LAGEVRIO  are:  diarrhea  nausea  dizziness These are not all the possible side effects of LAGEVRIO. Not many people have taken LAGEVRIO. Serious and unexpected side effects may happen. This medicine is still being studied, so it is possible that all of the risks are not known at this time.  What other treatment choices are there?  Veklury (remdesivir) is FDA-approved as an intravenous (IV) infusion for the treatment of mildto-moderate COVID-19 in certain adults and children. Talk with your doctor to see if Elias Else is appropriate for you. Like LAGEVRIO, FDA may also allow for the emergency use of other medicines to treat people with COVID-19. Go to http://www.austin-beck.biz/ for more information. It is your choice to be treated or not to be treated with LAGEVRIO. Should you decide not to take it, it will not change your standard medical care.  What if I am breastfeeding? Breastfeeding is  not recommended during treatment with LAGEVRIO and for 4 days after the last dose of LAGEVRIO. If you are breastfeeding or plan to breastfeed, talk to your healthcare provider about your options and specific situation before taking LAGEVRIO.  How do I report side effects with LAGEVRIO? Contact your healthcare provider if you have any side effects that bother you or do not go away. Report side effects to FDA MedWatch at MacRetreat.be or call 1-800-FDA-1088 (([phone]).  How should I store LAGEVRIO?  Store LAGEVRIO capsules at room temperature between 73F to 78F (20C to 25C).  Keep LAGEVRIO and all medicines out of the reach of children and pets. How can I learn more about COVID-19?  Ask your healthcare provider.  Visit IndexCrawler.co.za  Contact your local or state public health department.  Call [employer] at [phone] (toll free in the [place]  Visit  www.molnupiravir.com  What Is an Emergency Use Authorization (EUA)? The [place] FDA has made LAGEVRIO available under an emergency access mechanism called an Emergency Use Authorization (EUA) The EUA is supported by a Insurance account manager Health and Human Service (HHS) declaration that circumstances exist to justify emergency use of drugs and biological products during the COVID-19 pandemic. LAGEVRIO for the treatment of mild-to-moderate COVID-19 in adults with positive results of direct SARS-CoV-2 viral testing, who are at high risk for progression to severe COVID-19, including hospitalization or death, and for whom alternative COVID-19 treatment options approved or authorized by FDA are not accessible or clinically appropriate, has not undergone the same type of review as an FDA-approved product. In issuing an EUA under the COVID-19 public health emergency, the FDA has determined, among other things, that based on the total amount of scientific evidence available including data from adequate and well-controlled clinical trials, if available, it is reasonable to believe that the product may be effective for diagnosing, treating, or preventing COVID-19, or a serious or life-threatening disease or condition caused by COVID-19; that the known and potential benefits of the product, when used to diagnose, treat, or prevent such disease or condition, outweigh the known and potential risks of such product; and that there are no adequate, approved, and available alternatives.  All of these criteria must be met to allow for the product to be used in the treatment of patients during the COVID-19 pandemic. The EUA for LAGEVRIO is in effect for the duration of the COVID-19 declaration justifying emergency use of LAGEVRIO, unless terminated or revoked (after which LAGEVRIO may no longer be used under the EUA). For patent information: LeaseGuru.tn Copyright  2021-2022 [employer]  [employer], [place] and its affiliates. All rights reserved. [id number] Revised: March 2022

## 2020-09-30 DIAGNOSIS — J3089 Other allergic rhinitis: Secondary | ICD-10-CM | POA: Diagnosis not present

## 2020-09-30 DIAGNOSIS — J301 Allergic rhinitis due to pollen: Secondary | ICD-10-CM | POA: Diagnosis not present

## 2020-10-28 DIAGNOSIS — J3089 Other allergic rhinitis: Secondary | ICD-10-CM | POA: Diagnosis not present

## 2020-10-28 DIAGNOSIS — J301 Allergic rhinitis due to pollen: Secondary | ICD-10-CM | POA: Diagnosis not present

## 2020-11-08 DIAGNOSIS — J301 Allergic rhinitis due to pollen: Secondary | ICD-10-CM | POA: Diagnosis not present

## 2020-11-08 DIAGNOSIS — J3089 Other allergic rhinitis: Secondary | ICD-10-CM | POA: Diagnosis not present

## 2020-11-19 DIAGNOSIS — R519 Headache, unspecified: Secondary | ICD-10-CM | POA: Insufficient documentation

## 2020-11-19 DIAGNOSIS — J343 Hypertrophy of nasal turbinates: Secondary | ICD-10-CM | POA: Diagnosis not present

## 2020-11-19 DIAGNOSIS — J302 Other seasonal allergic rhinitis: Secondary | ICD-10-CM | POA: Diagnosis not present

## 2020-11-19 DIAGNOSIS — J324 Chronic pansinusitis: Secondary | ICD-10-CM | POA: Diagnosis not present

## 2020-11-23 ENCOUNTER — Other Ambulatory Visit: Payer: Self-pay | Admitting: Internal Medicine

## 2020-12-04 DIAGNOSIS — J3089 Other allergic rhinitis: Secondary | ICD-10-CM | POA: Diagnosis not present

## 2020-12-04 DIAGNOSIS — H35373 Puckering of macula, bilateral: Secondary | ICD-10-CM | POA: Diagnosis not present

## 2020-12-04 DIAGNOSIS — J301 Allergic rhinitis due to pollen: Secondary | ICD-10-CM | POA: Diagnosis not present

## 2020-12-04 DIAGNOSIS — H16223 Keratoconjunctivitis sicca, not specified as Sjogren's, bilateral: Secondary | ICD-10-CM | POA: Diagnosis not present

## 2020-12-04 DIAGNOSIS — H10413 Chronic giant papillary conjunctivitis, bilateral: Secondary | ICD-10-CM | POA: Diagnosis not present

## 2020-12-04 DIAGNOSIS — H25813 Combined forms of age-related cataract, bilateral: Secondary | ICD-10-CM | POA: Diagnosis not present

## 2020-12-06 DIAGNOSIS — J301 Allergic rhinitis due to pollen: Secondary | ICD-10-CM | POA: Diagnosis not present

## 2020-12-06 DIAGNOSIS — J3089 Other allergic rhinitis: Secondary | ICD-10-CM | POA: Diagnosis not present

## 2020-12-07 ENCOUNTER — Other Ambulatory Visit: Payer: Self-pay | Admitting: Pulmonary Disease

## 2020-12-07 ENCOUNTER — Other Ambulatory Visit: Payer: Self-pay | Admitting: Internal Medicine

## 2020-12-09 DIAGNOSIS — J301 Allergic rhinitis due to pollen: Secondary | ICD-10-CM | POA: Diagnosis not present

## 2020-12-09 DIAGNOSIS — J3089 Other allergic rhinitis: Secondary | ICD-10-CM | POA: Diagnosis not present

## 2021-01-02 ENCOUNTER — Encounter (INDEPENDENT_AMBULATORY_CARE_PROVIDER_SITE_OTHER): Payer: PPO | Admitting: Ophthalmology

## 2021-01-16 DIAGNOSIS — J3089 Other allergic rhinitis: Secondary | ICD-10-CM | POA: Diagnosis not present

## 2021-01-16 DIAGNOSIS — J301 Allergic rhinitis due to pollen: Secondary | ICD-10-CM | POA: Diagnosis not present

## 2021-01-20 ENCOUNTER — Encounter (INDEPENDENT_AMBULATORY_CARE_PROVIDER_SITE_OTHER): Payer: PPO | Admitting: Ophthalmology

## 2021-01-20 DIAGNOSIS — J301 Allergic rhinitis due to pollen: Secondary | ICD-10-CM | POA: Diagnosis not present

## 2021-01-20 DIAGNOSIS — J3089 Other allergic rhinitis: Secondary | ICD-10-CM | POA: Diagnosis not present

## 2021-01-21 ENCOUNTER — Ambulatory Visit (INDEPENDENT_AMBULATORY_CARE_PROVIDER_SITE_OTHER): Payer: PPO | Admitting: Internal Medicine

## 2021-01-21 ENCOUNTER — Encounter: Payer: Self-pay | Admitting: Internal Medicine

## 2021-01-21 ENCOUNTER — Other Ambulatory Visit: Payer: Self-pay

## 2021-01-21 ENCOUNTER — Ambulatory Visit (INDEPENDENT_AMBULATORY_CARE_PROVIDER_SITE_OTHER): Payer: PPO

## 2021-01-21 VITALS — BP 128/88 | HR 86 | Temp 98.4°F | Resp 18 | Ht 64.0 in | Wt 147.2 lb

## 2021-01-21 DIAGNOSIS — M79672 Pain in left foot: Secondary | ICD-10-CM

## 2021-01-21 DIAGNOSIS — I10 Essential (primary) hypertension: Secondary | ICD-10-CM

## 2021-01-21 DIAGNOSIS — R5383 Other fatigue: Secondary | ICD-10-CM

## 2021-01-21 DIAGNOSIS — Z23 Encounter for immunization: Secondary | ICD-10-CM

## 2021-01-21 DIAGNOSIS — M19072 Primary osteoarthritis, left ankle and foot: Secondary | ICD-10-CM | POA: Diagnosis not present

## 2021-01-21 DIAGNOSIS — F429 Obsessive-compulsive disorder, unspecified: Secondary | ICD-10-CM

## 2021-01-21 DIAGNOSIS — E7800 Pure hypercholesterolemia, unspecified: Secondary | ICD-10-CM | POA: Diagnosis not present

## 2021-01-21 DIAGNOSIS — Z0000 Encounter for general adult medical examination without abnormal findings: Secondary | ICD-10-CM

## 2021-01-21 DIAGNOSIS — J4520 Mild intermittent asthma, uncomplicated: Secondary | ICD-10-CM | POA: Diagnosis not present

## 2021-01-21 LAB — COMPREHENSIVE METABOLIC PANEL
ALT: 20 U/L (ref 0–35)
AST: 25 U/L (ref 0–37)
Albumin: 4.3 g/dL (ref 3.5–5.2)
Alkaline Phosphatase: 42 U/L (ref 39–117)
BUN: 16 mg/dL (ref 6–23)
CO2: 28 mEq/L (ref 19–32)
Calcium: 9.3 mg/dL (ref 8.4–10.5)
Chloride: 104 mEq/L (ref 96–112)
Creatinine, Ser: 0.93 mg/dL (ref 0.40–1.20)
GFR: 62.14 mL/min (ref >60.00)
Glucose, Bld: 106 mg/dL — ABNORMAL HIGH (ref 70–99)
Potassium: 3.9 mEq/L (ref 3.5–5.1)
Sodium: 139 mEq/L (ref 135–145)
Total Bilirubin: 0.4 mg/dL (ref 0.2–1.2)
Total Protein: 7.2 g/dL (ref 6.0–8.3)

## 2021-01-21 LAB — CBC
HCT: 40.2 % (ref 36.0–46.0)
Hemoglobin: 13.8 g/dL (ref 12.0–15.0)
MCHC: 34.3 g/dL (ref 30.0–36.0)
MCV: 93.3 fl (ref 78.0–100.0)
Platelets: 239 10*3/uL (ref 150.0–400.0)
RBC: 4.3 Mil/uL (ref 3.87–5.11)
RDW: 13.5 % (ref 11.5–15.5)
WBC: 4.7 10*3/uL (ref 4.0–10.5)

## 2021-01-21 LAB — HEMOGLOBIN A1C: Hgb A1c MFr Bld: 5.8 % (ref 4.6–6.5)

## 2021-01-21 LAB — TSH: TSH: 1.85 u[IU]/mL (ref 0.35–5.50)

## 2021-01-21 MED ORDER — ESCITALOPRAM OXALATE 10 MG PO TABS: 10.0000 mg | ORAL_TABLET | Freq: Every day | ORAL | 3 refills | Status: DC

## 2021-01-21 NOTE — Patient Instructions (Addendum)
We will get the x-ray of the foot.  We have sent in lexapro to switch to.   Think about the covid-19 booster shot.

## 2021-01-21 NOTE — Progress Notes (Signed)
Subjective:   Patient ID: Sheri Andrews, female    DOB: [birth year], 70 y.o.   MRN: [id number]  HPI Here for medicare wellness and physical, some new complaints. Please see A/P for status and treatment of chronic medical problems.   Diet: heart healthy Physical activity: sedentary Depression/mood screen: see A/P some anxiety Hearing: intact to whispered voice Visual acuity: grossly normal, performs annual eye exam  ADLs: capable Fall risk: none Home safety: good Cognitive evaluation: intact to orientation, naming, recall and repetition EOL planning: adv directives discussed  [facility] Visit from 11/24/2019 in [place] [facility]  PHQ-2 Total Score 0        Fall Risk  07/18/2020 03/15/2019 11/21/2018  Falls in the past year? 1 0 (No Data)  Comment - Emmi Telephone Survey: data to providers prior to load Emmi Telephone Survey: data to providers prior to load  Number falls in past yr: 0 - (No Data)  Comment - - Emmi Telephone Survey Actual Response =   Injury with Fall? 1 - -  Comment bruise on left arm - -    I have personally reviewed and have noted 1. The patient's medical and social history - reviewed today no changes 2. Their use of alcohol, tobacco or illicit drugs 3. Their current medications and supplements 4. The patient's functional ability including ADL's, fall risks, home safety risks and hearing or visual impairment. 5. Diet and physical activities 6. Evidence for depression or mood disorders 7. Care team reviewed and updated 8.  The patient is not on an opioid pain medication.  Patient Care Team: [name], MD as PCP - General (Internal Medicine) Charlton Haws, [facility] (Pharmacist) Charlton Haws, [facility] as Pharmacist (Pharmacist) Past Medical History:  Diagnosis Date   Acid reflux disease    Allergic rhinitis    Anxiety    Asthma    Barrett esophagus    Dyspnea    Dysthymia    Hypercholesterolemia     Hypertension    Irritable bowel syndrome    Nephrolithiasis    Osteoporosis    Other chest pain    Status post dilation of esophageal narrowing    Past Surgical History:  Procedure Laterality Date   ESOPHAGOGASTRODUODENOSCOPY (EGD) WITH PROPOFOL N/A 05/02/2019   Procedure: ESOPHAGOGASTRODUODENOSCOPY (EGD) WITH PROPOFOL;  Surgeon: Mauri Pole, MD;  Location: [facility];  Service: Endoscopy;  Laterality: N/A;  needing APC available   FACIAL COSMETIC SURGERY     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 [place]   Family History  Problem Relation Age of Onset   Heart disease Father    Heart disease Mother    Diabetes Mother    Diabetes Sister    Diabetes Brother    Cancer Maternal Uncle        type unknown   Throat cancer Paternal Uncle    Heart attack Sister    Colon cancer Neg Hx    Stomach cancer Neg Hx    Rectal cancer Neg Hx    Pancreatic cancer Neg Hx    Review of Systems  Constitutional: Negative.   HENT: Negative.    Eyes: Negative.   Respiratory:  Negative for cough, chest tightness and shortness of breath.   Cardiovascular:  Negative for chest pain, palpitations and leg swelling.  Gastrointestinal:  Negative for abdominal distention, abdominal pain, constipation, diarrhea, nausea and vomiting.  Musculoskeletal:  Positive for arthralgias.  Skin: Negative.   Neurological: Negative.   Psychiatric/Behavioral: Negative.     Objective:  Physical Exam Constitutional:      Appearance: She is well-developed.  HENT:     Head: Normocephalic and atraumatic.  Cardiovascular:     Rate and Rhythm: Normal rate and regular rhythm.  Pulmonary:     Effort: Pulmonary effort is normal. No respiratory distress.     Breath sounds: Normal breath sounds. No wheezing or rales.  Abdominal:     General: Bowel sounds are normal. There is no distension.     Palpations: Abdomen is soft.     Tenderness: There is no abdominal tenderness. There is no  rebound.  Musculoskeletal:     Cervical back: Normal range of motion.  Skin:    General: Skin is warm and dry.  Neurological:     Mental Status: She is alert and oriented to person, place, and time.     Coordination: Coordination normal.    Vitals:   01/21/21 1109  BP: 128/88  Pulse: 86  Resp: 18  Temp: 98.4 F (36.9 C)  TempSrc: Oral  SpO2: 96%  Weight: 147 lb 3.2 oz (66.8 kg)  Height: 5\' 4"  (1.626 m)    This visit occurred during the SARS-CoV-2 public health emergency.  Safety protocols were in place, including screening questions prior to the visit, additional usage of staff PPE, and extensive cleaning of exam room while observing appropriate contact time as indicated for disinfecting solutions.   Assessment & Plan:  Flu shot given at visit

## 2021-01-22 ENCOUNTER — Other Ambulatory Visit: Payer: Self-pay | Admitting: Internal Medicine

## 2021-01-22 DIAGNOSIS — Z1231 Encounter for screening mammogram for malignant neoplasm of breast: Secondary | ICD-10-CM | POA: Diagnosis not present

## 2021-01-22 DIAGNOSIS — J301 Allergic rhinitis due to pollen: Secondary | ICD-10-CM | POA: Diagnosis not present

## 2021-01-22 DIAGNOSIS — J3089 Other allergic rhinitis: Secondary | ICD-10-CM | POA: Diagnosis not present

## 2021-01-24 ENCOUNTER — Telehealth: Payer: Self-pay | Admitting: Internal Medicine

## 2021-01-24 DIAGNOSIS — M79672 Pain in left foot: Secondary | ICD-10-CM | POA: Insufficient documentation

## 2021-01-24 NOTE — Telephone Encounter (Signed)
   Patient is calling to discuss imaging results of foot. Patient states the knot on her foot was not addressed. Patient made aware of the result note from Dr [name]   Please call

## 2021-01-24 NOTE — Assessment & Plan Note (Signed)
Would like to stop prozac and we decided to start lexapro 10 mg daily instead.

## 2021-01-24 NOTE — Assessment & Plan Note (Signed)
Taking lipitor 40 mg daily and checking lipid panel. Adjust as needed.

## 2021-01-24 NOTE — Assessment & Plan Note (Signed)
No flare today, albuterol prn and symbicort prn. Continue treatment.

## 2021-01-24 NOTE — Assessment & Plan Note (Signed)
Ordered x-ray to rule out occult fracture as there was trauma at the onset (months ago).

## 2021-01-24 NOTE — Assessment & Plan Note (Signed)
BP at goal and will continue amlodipine 10 mg daily. Checking CMP and CBC today.

## 2021-01-24 NOTE — Assessment & Plan Note (Signed)
Flu shot given. Covid-19 booster recommended. Pneumonia complete. Shingrix counseled to get at pharmacy. Tetanus due 2023. Colonoscopy due 2030. Mammogram due 2024, pap smear aged out and dexa due 2024. Counseled about sun safety and mole surveillance. Counseled about the dangers of distracted driving. Given 10 year screening recommendations.

## 2021-01-27 ENCOUNTER — Other Ambulatory Visit: Payer: Self-pay | Admitting: Cardiovascular Disease

## 2021-01-27 ENCOUNTER — Other Ambulatory Visit: Payer: Self-pay | Admitting: Internal Medicine

## 2021-01-27 NOTE — Telephone Encounter (Signed)
Called patient. LVM asking her to give our office a call back. Office number was provided.

## 2021-01-28 ENCOUNTER — Other Ambulatory Visit: Payer: Self-pay | Admitting: Internal Medicine

## 2021-01-28 NOTE — Telephone Encounter (Signed)
Patient says she is in need of refills for all medications currently listed in chart  Pharmacy: [facility], [place] - [address] [place]  Phone:  [phone] Fax:  [phone]  90DS

## 2021-01-29 MED ORDER — PANTOPRAZOLE SODIUM 40 MG PO TBEC: 40.0000 mg | DELAYED_RELEASE_TABLET | Freq: Every day | ORAL | 3 refills | Status: DC

## 2021-01-29 MED ORDER — FAMOTIDINE 40 MG PO TABS: 40.0000 mg | ORAL_TABLET | Freq: Every day | ORAL | 3 refills | Status: DC

## 2021-01-29 MED ORDER — CLONAZEPAM 0.25 MG PO TBDP: ORAL_TABLET | ORAL | 0 refills | Status: DC

## 2021-01-29 MED ORDER — MONTELUKAST SODIUM 10 MG PO TABS: 10.0000 mg | ORAL_TABLET | Freq: Every day | ORAL | 3 refills | Status: DC

## 2021-01-30 DIAGNOSIS — J3089 Other allergic rhinitis: Secondary | ICD-10-CM | POA: Diagnosis not present

## 2021-01-30 DIAGNOSIS — J301 Allergic rhinitis due to pollen: Secondary | ICD-10-CM | POA: Diagnosis not present

## 2021-02-04 ENCOUNTER — Encounter (INDEPENDENT_AMBULATORY_CARE_PROVIDER_SITE_OTHER): Payer: PPO | Admitting: Ophthalmology

## 2021-02-05 ENCOUNTER — Encounter (INDEPENDENT_AMBULATORY_CARE_PROVIDER_SITE_OTHER): Payer: PPO | Admitting: Ophthalmology

## 2021-02-11 DIAGNOSIS — R059 Cough, unspecified: Secondary | ICD-10-CM | POA: Diagnosis not present

## 2021-02-11 DIAGNOSIS — H1045 Other chronic allergic conjunctivitis: Secondary | ICD-10-CM | POA: Diagnosis not present

## 2021-02-11 DIAGNOSIS — J3089 Other allergic rhinitis: Secondary | ICD-10-CM | POA: Diagnosis not present

## 2021-02-11 DIAGNOSIS — J301 Allergic rhinitis due to pollen: Secondary | ICD-10-CM | POA: Diagnosis not present

## 2021-02-24 ENCOUNTER — Encounter: Payer: Self-pay | Admitting: Pulmonary Disease

## 2021-02-24 ENCOUNTER — Other Ambulatory Visit: Payer: Self-pay

## 2021-02-24 ENCOUNTER — Ambulatory Visit (INDEPENDENT_AMBULATORY_CARE_PROVIDER_SITE_OTHER): Payer: PPO

## 2021-02-24 ENCOUNTER — Ambulatory Visit: Payer: PPO | Admitting: Pulmonary Disease

## 2021-02-24 VITALS — BP 124/80 | HR 96 | Temp 98.4°F | Ht 63.0 in | Wt 144.0 lb

## 2021-02-24 DIAGNOSIS — R0600 Dyspnea, unspecified: Secondary | ICD-10-CM

## 2021-02-24 DIAGNOSIS — J4540 Moderate persistent asthma, uncomplicated: Secondary | ICD-10-CM | POA: Diagnosis not present

## 2021-02-24 DIAGNOSIS — R918 Other nonspecific abnormal finding of lung field: Secondary | ICD-10-CM | POA: Diagnosis not present

## 2021-02-24 DIAGNOSIS — K219 Gastro-esophageal reflux disease without esophagitis: Secondary | ICD-10-CM

## 2021-02-24 DIAGNOSIS — I700 Atherosclerosis of aorta: Secondary | ICD-10-CM | POA: Diagnosis not present

## 2021-02-24 DIAGNOSIS — J3089 Other allergic rhinitis: Secondary | ICD-10-CM | POA: Diagnosis not present

## 2021-02-24 DIAGNOSIS — R0602 Shortness of breath: Secondary | ICD-10-CM | POA: Diagnosis not present

## 2021-02-24 DIAGNOSIS — R0982 Postnasal drip: Secondary | ICD-10-CM | POA: Diagnosis not present

## 2021-02-24 DIAGNOSIS — J301 Allergic rhinitis due to pollen: Secondary | ICD-10-CM | POA: Diagnosis not present

## 2021-02-24 MED ORDER — BREZTRI AEROSPHERE 160-9-4.8 MCG/ACT IN AERO: 2.0000 | INHALATION_SPRAY | Freq: Two times a day (BID) | RESPIRATORY_TRACT | 0 refills | Status: DC

## 2021-02-24 NOTE — Progress Notes (Signed)
Synopsis: Referred in January 2022 by Pricilla Holm, MD for Chronic Cough  Subjective:   PATIENT ID: Sheri Andrews GENDER: female DOB: 11/25/1950, MRN: [id number]  HPI  Chief Complaint  Patient presents with   Follow-up    Asthma has gotten worst in the last 6 months.   Sheri Andrews is a 70 year old woman, never smoker with history of allergic rhinitis, asthma and GERD who returns to pulmonary clinic for asthma.   She has been using symbicort 160-4.33mcg 2 puffs twice daily but has noticed increase shortness of breath since having covid 19 in June. She was treated outpatient with an antiviral medication. She denies night time awakenings. She is mainly experiencing exertional dyspnea and does notice wheezing when she is walking for exercise.   She continues on medications for her GERD and sinus congestion/drainage with out issues.  OV 05/29/20 She presented to the [facility] ER on 04/16/20 with sinus pressure, headaches, cough and sore throat. She tested negative for Covid 19. Chest radiograph was unremarkable as well as BMP and CBC. She was provided with azelastine nasal spray and discharged home. She complains of cough since her ER visit that is dry. She has congestion with post-nasal drip. She has some sinus congestion and ear discomfort. She has shortness of breath with exertion and with coughing episodes. She has history of hiatal hernia and is on pantoprazole and famotidine for GERD. She reports a substernal chest discomfort that is not worse with exertion or associated with nausea or diaphoresis.   She is taking symbicort 160-4.8mcg 2 puffs twice daily and does not complain of frequent wheezing. She is on montelukast for asthma and allergies. She is on flonase daily.   She has seen Dr. Wilburn Cornelia in ENT perviously for pansinusitis, last seen in 06/2018.          Past Medical History:  Diagnosis Date   Acid reflux disease    Allergic rhinitis    Anxiety    Asthma    Barrett esophagus     Dyspnea    Dysthymia    Hypercholesterolemia    Hypertension    Irritable bowel syndrome    Nephrolithiasis    Osteoporosis    Other chest pain    Status post dilation of esophageal narrowing      Family History  Problem Relation Age of Onset   Heart disease Father    Heart disease Mother    Diabetes Mother    Diabetes Sister    Diabetes Brother    Cancer Maternal Uncle        type unknown   Throat cancer Paternal Uncle    Heart attack Sister    Colon cancer Neg Hx    Stomach cancer Neg Hx    Rectal cancer Neg Hx    Pancreatic cancer Neg Hx      Social History   Socioeconomic History   Marital status: Married    Spouse name: Not on file   Number of children: 2   Years of education: Not on file   Highest education level: Not on file  Occupational History   Occupation: retired  Tobacco Use   Smoking status: Never   Smokeless tobacco: Never  Vaping Use   Vaping Use: Never used  Substance and Sexual Activity   Alcohol use: Yes    Alcohol/week: 3.0 standard drinks    Types: 3 Standard drinks or equivalent per week    Comment: social   Drug use: No  Sexual activity: Yes    Partners: Male  Other Topics Concern   Not on file  Social History Narrative   Not on file   Social Determinants of Health   Financial Resource Strain: Not on file  Food Insecurity: Not on file  Transportation Needs: Not on file  Physical Activity: Not on file  Stress: Not on file  Social Connections: Not on file  Intimate Partner Violence: Not on file     Allergies  Allergen Reactions   Bupropion Hcl Swelling    WELLBUTRIN   Doxycycline Hyclate Other (See Comments)    upset  stomach   Erythromycin Ethylsuccinate Other (See Comments)     upset stomach     Outpatient Medications Prior to Visit  Medication Sig Dispense Refill   albuterol (VENTOLIN HFA) 108 (90 Base) MCG/ACT inhaler 1-2 puff as needed     amLODipine (NORVASC) 10 MG tablet TAKE 1 TABLET(10 MG) BY MOUTH  DAILY 90 tablet 2   Artificial Tear Solution (TEARS NATURALE OP) Place 1 drop into both eyes See admin instructions. Use 1 drop into both eyes (scheduled) in the morning & may use 3 times daily if needed for dry /irritated eyes.     atorvastatin (LIPITOR) 40 MG tablet Take 1 tablet (40 mg total) by mouth daily. Please make overdue appt with Dr. [name] before anymore refills. Thank you 1st atttempt 30 tablet 0   azelastine (OPTIVAR) 0.05 % ophthalmic solution Place 1 drop into both eyes daily as needed for allergies.     Azelastine HCl 0.15 % SOLN USE 2 SPRAYS IN EACH NOSTRIL IN THE MORNING AND AT BEDTIME 30 mL 0   benzonatate (TESSALON PERLES) 100 MG capsule Take 1 capsule (100 mg total) by mouth 3 (three) times daily as needed. 20 capsule 0   budesonide-formoterol (SYMBICORT) 160-4.5 MCG/ACT inhaler Inhale 2 puffs into the lungs 2 (two) times daily.     cholecalciferol (VITAMIN D) 25 MCG (1000 UT) tablet Take 1,000 Units by mouth at bedtime.     clonazePAM (KLONOPIN) 0.25 MG disintegrating tablet DISSOLVE ONE TABLET BY MOUTH EVERY DAY AS NEEDED 30 tablet 0   EPIPEN 2-PAK 0.3 MG/0.3ML SOAJ injection Inject 0.3 mg into the muscle as needed for anaphylaxis.   1   escitalopram (LEXAPRO) 10 MG tablet Take 1 tablet (10 mg total) by mouth daily. 90 tablet 3   estrogen, conjugated,-medroxyprogesterone (PREMPRO) 0.45-1.5 MG per tablet Take 1 tablet by mouth at bedtime.      famotidine (PEPCID) 40 MG tablet Take 1 tablet (40 mg total) by mouth daily. 90 tablet 3   fluticasone (FLONASE) 50 MCG/ACT nasal spray Place 2 sprays into both nostrils daily.     ipratropium (ATROVENT) 0.03 % nasal spray Place 2 sprays into both nostrils every 12 (twelve) hours. 30 mL 12   montelukast (SINGULAIR) 10 MG tablet Take 1 tablet (10 mg total) by mouth at bedtime. 90 tablet 3   Olopatadine HCl 0.7 % SOLN Place 1 drop into both eyes daily as needed (allergies.).      pantoprazole (PROTONIX) 40 MG tablet Take 1 tablet (40 mg  total) by mouth daily. 180 tablet 3   polyethylene glycol (MIRALAX / GLYCOLAX) 17 g packet Take 17 g by mouth daily as needed for mild constipation.      Probiotic Product (PROBIOTIC COLON SUPPORT PO) Take 1 capsule by mouth at bedtime.      Spacer/Aero-Holding Chambers (VALVED HOLDING CHAMBER) DEVI SMARTSIG:Inhalation Via Inhaler As Directed  triamcinolone cream (KENALOG) 0.1 % Apply 1 application topically 2 (two) times daily. 100 g 0   No facility-administered medications prior to visit.    Review of Systems  Constitutional:  Negative for chills, fever, malaise/fatigue and weight loss.  HENT:  Negative for congestion, sinus pain and sore throat.   Eyes: Negative.   Respiratory:  Positive for shortness of breath. Negative for cough, hemoptysis, sputum production and wheezing.   Cardiovascular:  Negative for chest pain, palpitations, orthopnea, claudication and leg swelling.  Gastrointestinal:  Negative for abdominal pain, heartburn, nausea and vomiting.  Genitourinary: Negative.   Musculoskeletal:  Negative for joint pain and myalgias.  Skin:  Negative for rash.  Neurological:  Negative for weakness.  Endo/Heme/Allergies: Negative.   Psychiatric/Behavioral: Negative.     Objective:   Vitals:   02/24/21 1126  BP: 124/80  Pulse: 96  Temp: 98.4 F (36.9 C)  TempSrc: Oral  SpO2: 97%  Weight: 144 lb (65.3 kg)  Height: 5\' 3"  (1.6 m)     Physical Exam Constitutional:      General: She is not in acute distress.    Appearance: Normal appearance. She is not ill-appearing.  HENT:     Head: Normocephalic and atraumatic.  Eyes:     General: No scleral icterus.    Conjunctiva/sclera: Conjunctivae normal.     Pupils: Pupils are equal, round, and reactive to light.  Cardiovascular:     Rate and Rhythm: Normal rate and regular rhythm.     Pulses: Normal pulses.     Heart sounds: Normal heart sounds. No murmur heard. Pulmonary:     Effort: Pulmonary effort is normal.      Breath sounds: Normal breath sounds. No wheezing, rhonchi or rales.  Musculoskeletal:     Right lower leg: No edema.     Left lower leg: No edema.  Skin:    General: Skin is warm and dry.  Neurological:     General: No focal deficit present.     Mental Status: She is alert.    CBC    Component Value Date/Time   WBC 4.7 01/21/2021 1141   RBC 4.30 01/21/2021 1141   HGB 13.8 01/21/2021 1141   HCT 40.2 01/21/2021 1141   PLT 239.0 01/21/2021 1141   MCV 93.3 01/21/2021 1141   MCH 31.8 04/16/2020 0801   MCHC 34.3 01/21/2021 1141   RDW 13.5 01/21/2021 1141   LYMPHSABS 1.5 04/16/2020 0801   MONOABS 0.7 04/16/2020 0801   EOSABS 0.0 04/16/2020 0801   BASOSABS 0.0 04/16/2020 0801   BMP Latest Ref Rng & Units 01/21/2021 09/04/2020 04/16/2020  Glucose 70 - 99 mg/dL 106(H) 106(H) 106(H)  BUN 6 - 23 mg/dL 16 18 14   Creatinine 0.40 - 1.20 mg/dL 0.93 0.93 0.84  BUN/Creat Ratio 6 - 22 (calc) - - -  Sodium 135 - 145 mEq/L 139 139 139  Potassium 3.5 - 5.1 mEq/L 3.9 3.6 3.7  Chloride 96 - 112 mEq/L 104 105 103  CO2 19 - 32 mEq/L 28 26 25   Calcium 8.4 - 10.5 mg/dL 9.3 9.4 9.2   Chest imaging: CXR 02/24/21 Increased interstitial markings bilaterally  CXR 04/16/20 The heart size and mediastinal contours are within normal limits. Both lungs are clear. No pleural effusion. The visualized skeletal structures are unremarkable.  PFT: No flowsheet data found.  Labs:  Path:  Echo:  Heart Catheterization:  Assessment & Plan:   Moderate persistent asthma without complication - Plan: DG Chest 2 View, Pulmonary Function  Test  Dyspnea, unspecified type - Plan: DG Chest 2 View  Post-nasal drainage  Gastroesophageal reflux disease without esophagitis  Discussion: Sheri Andrews is a 70 year old woman, never smoker with history of allergic rhinitis, asthma and GERD who returns to pulmonary clinic for asthma.   She appears to have moderate persistent asthma given her increase in dyspnea  since having covid. We will provide her with [facility] sample and monitor for improvement in her symptoms. She is to stop symbicort inhaler while using breztri.   Her chest radiograph today does show increased interstitial markings, which could be related to her recent covid infection. We will await final radiology read. We will obtain pulmonary function tests to correlate with her chest imaging.   She is to continue flonase and azelastine for her sinus congestion. She is to continue PPI and famotidine for GERD.   Follow up in 6 months.  Freda Jackson, MD [facility] Office: [phone]      Current Outpatient Medications:    albuterol (VENTOLIN HFA) 108 (90 Base) MCG/ACT inhaler, 1-2 puff as needed, Disp: , Rfl:    Budeson-Glycopyrrol-Formoterol (BREZTRI AEROSPHERE) 160-9-4.8 MCG/ACT AERO, Inhale 2 puffs into the lungs 2 (two) times daily., Disp: 5.9 g, Rfl: 0   amLODipine (NORVASC) 10 MG tablet, TAKE 1 TABLET(10 MG) BY MOUTH DAILY, Disp: 90 tablet, Rfl: 2   Artificial Tear Solution (TEARS NATURALE OP), Place 1 drop into both eyes See admin instructions. Use 1 drop into both eyes (scheduled) in the morning & may use 3 times daily if needed for dry /irritated eyes., Disp: , Rfl:    atorvastatin (LIPITOR) 40 MG tablet, Take 1 tablet (40 mg total) by mouth daily. Please make overdue appt with Dr. [name] before anymore refills. Thank you 1st atttempt, Disp: 30 tablet, Rfl: 0   azelastine (OPTIVAR) 0.05 % ophthalmic solution, Place 1 drop into both eyes daily as needed for allergies., Disp: , Rfl:    Azelastine HCl 0.15 % SOLN, USE 2 SPRAYS IN EACH NOSTRIL IN THE MORNING AND AT BEDTIME, Disp: 30 mL, Rfl: 0   benzonatate (TESSALON PERLES) 100 MG capsule, Take 1 capsule (100 mg total) by mouth 3 (three) times daily as needed., Disp: 20 capsule, Rfl: 0   budesonide-formoterol (SYMBICORT) 160-4.5 MCG/ACT inhaler, Inhale 2 puffs into the lungs 2 (two) times daily., Disp: , Rfl:     cholecalciferol (VITAMIN D) 25 MCG (1000 UT) tablet, Take 1,000 Units by mouth at bedtime., Disp: , Rfl:    clonazePAM (KLONOPIN) 0.25 MG disintegrating tablet, DISSOLVE ONE TABLET BY MOUTH EVERY DAY AS NEEDED, Disp: 30 tablet, Rfl: 0   EPIPEN 2-PAK 0.3 MG/0.3ML SOAJ injection, Inject 0.3 mg into the muscle as needed for anaphylaxis. , Disp: , Rfl: 1   escitalopram (LEXAPRO) 10 MG tablet, Take 1 tablet (10 mg total) by mouth daily., Disp: 90 tablet, Rfl: 3   estrogen, conjugated,-medroxyprogesterone (PREMPRO) 0.45-1.5 MG per tablet, Take 1 tablet by mouth at bedtime. , Disp: , Rfl:    famotidine (PEPCID) 40 MG tablet, Take 1 tablet (40 mg total) by mouth daily., Disp: 90 tablet, Rfl: 3   fluticasone (FLONASE) 50 MCG/ACT nasal spray, Place 2 sprays into both nostrils daily., Disp: , Rfl:    ipratropium (ATROVENT) 0.03 % nasal spray, Place 2 sprays into both nostrils every 12 (twelve) hours., Disp: 30 mL, Rfl: 12   montelukast (SINGULAIR) 10 MG tablet, Take 1 tablet (10 mg total) by mouth at bedtime., Disp: 90 tablet,  Rfl: 3   Olopatadine HCl 0.7 % SOLN, Place 1 drop into both eyes daily as needed (allergies.). , Disp: , Rfl:    pantoprazole (PROTONIX) 40 MG tablet, Take 1 tablet (40 mg total) by mouth daily., Disp: 180 tablet, Rfl: 3   polyethylene glycol (MIRALAX / GLYCOLAX) 17 g packet, Take 17 g by mouth daily as needed for mild constipation. , Disp: , Rfl:    Probiotic Product (PROBIOTIC COLON SUPPORT PO), Take 1 capsule by mouth at bedtime. , Disp: , Rfl:    Spacer/Aero-Holding Chambers (VALVED HOLDING CHAMBER) DEVI, SMARTSIG:Inhalation Via Inhaler As Directed, Disp: , Rfl:    triamcinolone cream (KENALOG) 0.1 %, Apply 1 application topically 2 (two) times daily., Disp: 100 g, Rfl: 0

## 2021-02-24 NOTE — Patient Instructions (Signed)
Start Breztri inhaler sample over the next 2 weeks - rinse mouth out after each use - stop using symbicort inhaler while using breztri  Let us know if the Judithann Sauger is helping with your breathing and we will send in a prescription.   We will check a chest x-ray today  We will schedule you for pulmonary function tests at your convenience   Follow up in 6 months

## 2021-02-25 ENCOUNTER — Telehealth: Payer: Self-pay | Admitting: Pulmonary Disease

## 2021-02-25 MED ORDER — PREDNISONE 10 MG PO TABS: ORAL_TABLET | ORAL | 0 refills | Status: AC

## 2021-02-25 NOTE — Telephone Encounter (Signed)
I called patient to let her know about her chest x-ray results from 11/7. The radiologist mentioned inflammation around her airways concerning for possible bronchitis. I left a VM with this info and if patient would like to try a steroid taper. If she would like to try a taper, please send in the following:  Prednisone Taper: 30mg  daily x 3 days 20mg  daily x 3 days 10mg  daily x 3 days  Thanks, Wille Glaser

## 2021-02-25 NOTE — Telephone Encounter (Signed)
Called and spoke with pt letting her know that we were going to send in pred taper as directed by Dr. Erin Fulling and she verbalized understanding. Verified preferred pharmacy and sent Rx in for pt. Nothing further needed.

## 2021-02-27 DIAGNOSIS — J3089 Other allergic rhinitis: Secondary | ICD-10-CM | POA: Diagnosis not present

## 2021-02-27 DIAGNOSIS — J301 Allergic rhinitis due to pollen: Secondary | ICD-10-CM | POA: Diagnosis not present

## 2021-03-03 ENCOUNTER — Encounter (INDEPENDENT_AMBULATORY_CARE_PROVIDER_SITE_OTHER): Payer: PPO | Admitting: Ophthalmology

## 2021-03-06 ENCOUNTER — Other Ambulatory Visit: Payer: Self-pay | Admitting: Cardiovascular Disease

## 2021-03-06 DIAGNOSIS — J301 Allergic rhinitis due to pollen: Secondary | ICD-10-CM | POA: Diagnosis not present

## 2021-03-06 DIAGNOSIS — J3089 Other allergic rhinitis: Secondary | ICD-10-CM | POA: Diagnosis not present

## 2021-03-24 DIAGNOSIS — J329 Chronic sinusitis, unspecified: Secondary | ICD-10-CM | POA: Diagnosis not present

## 2021-03-24 DIAGNOSIS — J302 Other seasonal allergic rhinitis: Secondary | ICD-10-CM | POA: Diagnosis not present

## 2021-03-24 DIAGNOSIS — J343 Hypertrophy of nasal turbinates: Secondary | ICD-10-CM | POA: Diagnosis not present

## 2021-03-24 DIAGNOSIS — J341 Cyst and mucocele of nose and nasal sinus: Secondary | ICD-10-CM | POA: Diagnosis not present

## 2021-03-24 DIAGNOSIS — J324 Chronic pansinusitis: Secondary | ICD-10-CM | POA: Diagnosis not present

## 2021-03-31 ENCOUNTER — Other Ambulatory Visit: Payer: Self-pay | Admitting: Pulmonary Disease

## 2021-04-01 ENCOUNTER — Telehealth: Payer: Self-pay | Admitting: Pulmonary Disease

## 2021-04-01 NOTE — Telephone Encounter (Signed)
Pt calling- out of town, pt states since predisone was finished last week, pt has had chest tightness. Pt wanting to see if JD will refill prednisone and pt can see him or APP next week. Please advise [facility] in [place]

## 2021-04-01 NOTE — Telephone Encounter (Signed)
LMTCB

## 2021-04-01 NOTE — Telephone Encounter (Signed)
I called and spoke with the pt She is c/o feeling of heaviness in her chest for the past 3-4 days  She has had a dry cough for about a wk  No increased SOB, wheezing, f/c/s, body aches  She says that her nose seems stuffy at bedtime  She denies sore throat or PND She took a covid test and it was neg  She has been vaccinated against flu and covid and denies any known sick contacts  She takes breztri, singulair, atrovent ns, flonase, tessalon pearles  She is out of town and left her albuterol inhaler at home so not using this  She is wondering if we could call in pred for her since this has helped in the past Please advise, thanks!  Allergies  Allergen Reactions   Bupropion Hcl Swelling    WELLBUTRIN   Doxycycline Hyclate Other (See Comments)    upset  stomach   Erythromycin Ethylsuccinate Other (See Comments)     upset stomach

## 2021-04-01 NOTE — Telephone Encounter (Signed)
I called and spoke with the pt and scheduled her for video visit with Eric Form, NP for tomorrow at 4:30. Advised seek emergent care sooner if needed.

## 2021-04-02 ENCOUNTER — Other Ambulatory Visit: Payer: Self-pay

## 2021-04-02 ENCOUNTER — Encounter: Payer: Self-pay | Admitting: Acute Care

## 2021-04-02 ENCOUNTER — Telehealth (INDEPENDENT_AMBULATORY_CARE_PROVIDER_SITE_OTHER): Payer: PPO | Admitting: Acute Care

## 2021-04-02 DIAGNOSIS — J45901 Unspecified asthma with (acute) exacerbation: Secondary | ICD-10-CM

## 2021-04-02 MED ORDER — PREDNISONE 10 MG PO TABS: ORAL_TABLET | ORAL | 0 refills | Status: DC

## 2021-04-02 MED ORDER — ALBUTEROL SULFATE HFA 108 (90 BASE) MCG/ACT IN AERS: INHALATION_SPRAY | RESPIRATORY_TRACT | 3 refills | Status: AC

## 2021-04-02 NOTE — Patient Instructions (Addendum)
It is good to talk with you today We will send in a prednisone taper Prednisone taper; 10 mg tablets: 4 tabs x 2 days, 3 tabs x 2 days, 2 tabs x 2 days 1 tab x 2 days then stop.  We will send in a prescription for a rescue inhaler to a pharmacy in [place]. Use as needed for breakthrough shortness of breath or wheezing Continue Breztri as you have been doing . Rinse mouth after use. Seek emergency care if you get worse not better.  Follow up with Dr. Erin Fulling 04/08/2021 as is scheduled Please contact office for sooner follow up if symptoms do not improve or worsen or seek emergency care

## 2021-04-02 NOTE — Progress Notes (Signed)
Virtual Visit via Video Note  I connected with Sheri Andrews on 04/02/21 at  4:30 PM EST by a video enabled telemedicine application and verified that I am speaking with the correct person using two identifiers.  Location: Patient: In [place] in car Provider: [place], [place], [place], [address]    I discussed the limitations of evaluation and management by telemedicine and the availability of in person appointments. The patient expressed understanding and agreed to proceed.   Synopsis Sheri Andrews is a 70 y.o. female never smoker with allergic rhinitis, asthma and GERD . She had Covid 19 in 2019. She is followed by Dr. Franne Grip. She was seen 02/24/2021 for an asthma flare. CXR showed peribronchial cuffing , concerning for bronchitis. She was treated by Dr. Erin Fulling with a prednisone taper.    04/02/2021 History of Present Illness: Pt. Calls for an acute appointment today. She completed the prednisone taper that Dr. Erin Fulling prescribed 11/7. She states she felt better on prednisone but as soon as the taper finished she states she felt some chest tightness again. For the last week or so she has had a non-productive cough, and some wheezing. She is in [place] and she does not have her rescue inhaler.  She is scheduled for PFT's and appointment with Dr. Erin Fulling 04/08/2021. She denies fever or chest pain.   Observations/Objective: CXR 02/24/2021 Lung volumes are normal. Mild diffuse peribronchial cuffing. No consolidative airspace disease. No pleural effusions. No pneumothorax. No pulmonary nodule or mass noted. Pulmonary vasculature and the cardiomediastinal silhouette are within normal limits. Atherosclerosis in the thoracic aorta.   IMPRESSION: 1. Mild diffuse peribronchial cuffing, concerning for bronchitis. 2. Aortic atherosclerosis.   Assessment and Plan: Slow to resolve asthma flare 02/24/2021 Pred taper completed Re-flare 04/02/2021 Plan We will send in a  prednisone taper Prednisone taper; 10 mg tablets: 4 tabs x 2 days, 3 tabs x 2 days, 2 tabs x 2 days 1 tab x 2 days then stop.  We will send in a prescription for a rescue inhaler to a pharmacy in [place]. Use as needed for breakthrough shortness of breath or wheezing Continue Breztri as you have been doing . Rinse mouth after use. Seek emergency care if you get worse not better.  Follow up with Dr. Erin Fulling 04/08/2021 as is scheduled Please contact office for sooner follow up if symptoms do not improve or worsen or seek emergency care    Follow Up Instructions: Follow up with Dr. Erin Fulling 04/08/2021 as is scheduled Please contact office for sooner follow up if symptoms do not improve or worsen or seek emergency care     I discussed the assessment and treatment plan with the patient. The patient was provided an opportunity to ask questions and all were answered. The patient agreed with the plan and demonstrated an understanding of the instructions.   The patient was advised to call back or seek an in-person evaluation if the symptoms worsen or if the condition fails to improve as anticipated.  I provided 25 minutes of non-face-to-face time during this encounter.   Magdalen Spatz, NP  04/02/2021 5:31 PM

## 2021-04-02 NOTE — Progress Notes (Deleted)
History of Present Illness Sheri Andrews is a 70 y.o. female with    04/02/2021 Asthma flare after Covid 2019. She is having an asthma flare Seen by [facility] 02/24/2021 PFT's Tuesday  APPT with Dr. Erin Fulling Had pred taper in November, which helped and then she has had increasing chest tightness/ heaviness CXR at that time showed peribronchial cuffing , concerning for bronchitis.  Test Results:  CBC Latest Ref Rng & Units 01/21/2021 04/16/2020 11/24/2019  WBC 4.0 - 10.5 K/uL 4.7 6.6 4.9  Hemoglobin 12.0 - 15.0 g/dL 13.8 12.7 13.7  Hematocrit 36.0 - 46.0 % 40.2 37.6 39.9  Platelets 150.0 - 400.0 K/uL 239.0 213 245    BMP Latest Ref Rng & Units 01/21/2021 09/04/2020 04/16/2020  Glucose 70 - 99 mg/dL 106(H) 106(H) 106(H)  BUN 6 - 23 mg/dL 16 18 14   Creatinine 0.40 - 1.20 mg/dL 0.93 0.93 0.84  BUN/Creat Ratio 6 - 22 (calc) - - -  Sodium 135 - 145 mEq/L 139 139 139  Potassium 3.5 - 5.1 mEq/L 3.9 3.6 3.7  Chloride 96 - 112 mEq/L 104 105 103  CO2 19 - 32 mEq/L 28 26 25   Calcium 8.4 - 10.5 mg/dL 9.3 9.4 9.2    BNP No results found for: BNP  ProBNP No results found for: PROBNP  PFT No results found for: FEV1PRE, FEV1POST, FVCPRE, FVCPOST, TLC, DLCOUNC, PREFEV1FVCRT, PSTFEV1FVCRT  No results found.   Past medical hx Past Medical History:  Diagnosis Date   Acid reflux disease    Allergic rhinitis    Anxiety    Asthma    Barrett esophagus    Dyspnea    Dysthymia    Hypercholesterolemia    Hypertension    Irritable bowel syndrome    Nephrolithiasis    Osteoporosis    Other chest pain    Status post dilation of esophageal narrowing      Social History   Tobacco Use   Smoking status: Never   Smokeless tobacco: Never  Vaping Use   Vaping Use: Never used  Substance Use Topics   Alcohol use: Yes    Alcohol/week: 3.0 standard drinks    Types: 3 Standard drinks or equivalent per week    Comment: social   Drug use: No    [name] reports that she has never smoked.  She has never used smokeless tobacco. She reports current alcohol use of about 3.0 standard drinks per week. She reports that she does not use drugs.  Tobacco Cessation: Counseling given: Not Answered   Past surgical hx, Family hx, Social hx all reviewed.  Current Outpatient Medications on File Prior to Visit  Medication Sig   albuterol (VENTOLIN HFA) 108 (90 Base) MCG/ACT inhaler 1-2 puff as needed   amLODipine (NORVASC) 10 MG tablet TAKE 1 TABLET(10 MG) BY MOUTH DAILY   Artificial Tear Solution (TEARS NATURALE OP) Place 1 drop into both eyes See admin instructions. Use 1 drop into both eyes (scheduled) in the morning & may use 3 times daily if needed for dry /irritated eyes.   atorvastatin (LIPITOR) 40 MG tablet Take 1 tablet (40 mg total) by mouth daily. Please make overdue appt with Dr. [name] before anymore refills. Thank you 2nd attempt   azelastine (OPTIVAR) 0.05 % ophthalmic solution Place 1 drop into both eyes daily as needed for allergies.   Azelastine HCl 0.15 % SOLN USE 2 SPRAYS IN EACH NOSTRIL IN THE MORNING AND AT BEDTIME   benzonatate (TESSALON PERLES) 100 MG capsule  Take 1 capsule (100 mg total) by mouth 3 (three) times daily as needed.   Budeson-Glycopyrrol-Formoterol (BREZTRI AEROSPHERE) 160-9-4.8 MCG/ACT AERO Inhale 2 puffs into the lungs 2 (two) times daily.   cholecalciferol (VITAMIN D) 25 MCG (1000 UT) tablet Take 1,000 Units by mouth at bedtime.   clonazePAM (KLONOPIN) 0.25 MG disintegrating tablet DISSOLVE ONE TABLET BY MOUTH EVERY DAY AS NEEDED   EPIPEN 2-PAK 0.3 MG/0.3ML SOAJ injection Inject 0.3 mg into the muscle as needed for anaphylaxis.    escitalopram (LEXAPRO) 10 MG tablet Take 1 tablet (10 mg total) by mouth daily.   estrogen, conjugated,-medroxyprogesterone (PREMPRO) 0.45-1.5 MG per tablet Take 1 tablet by mouth at bedtime.    fluticasone (FLONASE) 50 MCG/ACT nasal spray Place 2 sprays into both nostrils daily.   ipratropium (ATROVENT) 0.03 % nasal spray  Place 2 sprays into both nostrils every 12 (twelve) hours.   montelukast (SINGULAIR) 10 MG tablet Take 1 tablet (10 mg total) by mouth at bedtime.   Olopatadine HCl 0.7 % SOLN Place 1 drop into both eyes daily as needed (allergies.).    pantoprazole (PROTONIX) 40 MG tablet Take 1 tablet (40 mg total) by mouth daily.   polyethylene glycol (MIRALAX / GLYCOLAX) 17 g packet Take 17 g by mouth daily as needed for mild constipation.    Probiotic Product (PROBIOTIC COLON SUPPORT PO) Take 1 capsule by mouth at bedtime.    Spacer/Aero-Holding Chambers (VALVED HOLDING CHAMBER) DEVI SMARTSIG:Inhalation Via Inhaler As Directed   triamcinolone cream (KENALOG) 0.1 % Apply 1 application topically 2 (two) times daily. (Patient not taking: Reported on 04/02/2021)   No current facility-administered medications on file prior to visit.     Allergies  Allergen Reactions   Bupropion Hcl Swelling    WELLBUTRIN   Doxycycline Hyclate Other (See Comments)    upset  stomach   Erythromycin Ethylsuccinate Other (See Comments)     upset stomach    Review Of Systems:  Constitutional:   No  weight loss, night sweats,  Fevers, chills, fatigue, or  lassitude.  HEENT:   No headaches,  Difficulty swallowing,  Tooth/dental problems, or  Sore throat,                No sneezing, itching, ear ache, nasal congestion, post nasal drip,   CV:  No chest pain,  Orthopnea, PND, swelling in lower extremities, anasarca, dizziness, palpitations, syncope.   GI  No heartburn, indigestion, abdominal pain, nausea, vomiting, diarrhea, change in bowel habits, loss of appetite, bloody stools.   Resp: No shortness of breath with exertion or at rest.  No excess mucus, no productive cough,  No non-productive cough,  No coughing up of blood.  No change in color of mucus.  No wheezing.  No chest wall deformity  Skin: no rash or lesions.  GU: no dysuria, change in color of urine, no urgency or frequency.  No flank pain, no hematuria   MS:   No joint pain or swelling.  No decreased range of motion.  No back pain.  Psych:  No change in mood or affect. No depression or anxiety.  No memory loss.   Vital Signs There were no vitals taken for this visit.   Physical Exam:  General- No distress,  A&Ox3 ENT: No sinus tenderness, TM clear, pale nasal mucosa, no oral exudate,no post nasal drip, no LAN Cardiac: S1, S2, regular rate and rhythm, no murmur Chest: No wheeze/ rales/ dullness; no accessory muscle use, no nasal flaring, no sternal  retractions Abd.: Soft Non-tender Ext: No clubbing cyanosis, edema Neuro:  normal strength Skin: No rashes, warm and dry Psych: normal mood and behavior   Assessment/Plan  No problem-specific Assessment & Plan notes found for this encounter.    Magdalen Spatz, NP 04/02/2021  4:55 PM

## 2021-04-03 ENCOUNTER — Encounter (INDEPENDENT_AMBULATORY_CARE_PROVIDER_SITE_OTHER): Payer: PPO | Admitting: Ophthalmology

## 2021-04-07 ENCOUNTER — Ambulatory Visit: Payer: PPO | Admitting: Pulmonary Disease

## 2021-04-08 ENCOUNTER — Ambulatory Visit (INDEPENDENT_AMBULATORY_CARE_PROVIDER_SITE_OTHER): Payer: PPO | Admitting: Pulmonary Disease

## 2021-04-08 ENCOUNTER — Other Ambulatory Visit: Payer: Self-pay

## 2021-04-08 ENCOUNTER — Encounter: Payer: Self-pay | Admitting: Pulmonary Disease

## 2021-04-08 ENCOUNTER — Ambulatory Visit: Payer: PPO | Admitting: Pulmonary Disease

## 2021-04-08 VITALS — BP 118/70 | HR 95 | Ht 63.0 in | Wt 142.0 lb

## 2021-04-08 DIAGNOSIS — R0602 Shortness of breath: Secondary | ICD-10-CM

## 2021-04-08 DIAGNOSIS — R942 Abnormal results of pulmonary function studies: Secondary | ICD-10-CM | POA: Diagnosis not present

## 2021-04-08 DIAGNOSIS — K449 Diaphragmatic hernia without obstruction or gangrene: Secondary | ICD-10-CM

## 2021-04-08 DIAGNOSIS — J4540 Moderate persistent asthma, uncomplicated: Secondary | ICD-10-CM | POA: Diagnosis not present

## 2021-04-08 DIAGNOSIS — J45901 Unspecified asthma with (acute) exacerbation: Secondary | ICD-10-CM

## 2021-04-08 DIAGNOSIS — K219 Gastro-esophageal reflux disease without esophagitis: Secondary | ICD-10-CM

## 2021-04-08 LAB — PULMONARY FUNCTION TEST
DL/VA % pred: 106 %
DL/VA: 4.44 ml/min/mmHg/L
DLCO cor % pred: 117 %
DLCO cor: 22.11 ml/min/mmHg
DLCO unc % pred: 117 %
DLCO unc: 22.11 ml/min/mmHg
FEF 25-75 Post: 0.77 L/sec
FEF 25-75 Pre: 1.34 L/sec
FEF2575-%Change-Post: -42 %
FEF2575-%Pred-Post: 42 %
FEF2575-%Pred-Pre: 74 %
FEV1-%Change-Post: -24 %
FEV1-%Pred-Post: 64 %
FEV1-%Pred-Pre: 85 %
FEV1-Post: 1.37 L
FEV1-Pre: 1.82 L
FEV1FVC-%Change-Post: -25 %
FEV1FVC-%Pred-Pre: 87 %
FEV6-%Change-Post: 1 %
FEV6-%Pred-Post: 102 %
FEV6-%Pred-Pre: 101 %
FEV6-Post: 2.77 L
FEV6-Pre: 2.74 L
FEV6FVC-%Pred-Post: 104 %
FEV6FVC-%Pred-Pre: 104 %
FVC-%Change-Post: 1 %
FVC-%Pred-Post: 97 %
FVC-%Pred-Pre: 96 %
FVC-Post: 2.77 L
FVC-Pre: 2.74 L
Post FEV1/FVC ratio: 50 %
Post FEV6/FVC ratio: 100 %
Pre FEV1/FVC ratio: 66 %
Pre FEV6/FVC Ratio: 100 %
RV % pred: 92 %
RV: 1.99 L
TLC % pred: 104 %
TLC: 5.14 L

## 2021-04-08 MED ORDER — BREZTRI AEROSPHERE 160-9-4.8 MCG/ACT IN AERO: 2.0000 | INHALATION_SPRAY | Freq: Two times a day (BID) | RESPIRATORY_TRACT | 0 refills | Status: DC

## 2021-04-08 NOTE — Patient Instructions (Signed)
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  There are some abnormalities on the pulmonary function test which are concerning for possible excessive airway movement during inspiration and expiration.  We will further evaluate this with a high-resolution CT chest scan.  Continue on pantoprazole 40 mg 30 minutes prior to breakfast.  Resume taking famotidine at bedtime.  Recommend obtaining a wedge pillow or placing blocks underneath the headboard to elevate your bed 4 to 6 inches to reduce nocturnal reflux.

## 2021-04-08 NOTE — Progress Notes (Signed)
Synopsis: Referred in January 2022 by Pricilla Holm, MD for Chronic Cough  Subjective:   PATIENT ID: Sheri Andrews GENDER: female DOB: 11-01-1950, MRN: [id number]  HPI  Chief Complaint  Patient presents with   Follow-up    F/U after PFT and prednisone from last week.    Sheri Andrews is a 70 year old woman, never smoker with history of allergic rhinitis, asthma and GERD who returns to pulmonary clinic for asthma.   She was trialed on Breztri inhaler after last visit and she does report some improvement in her breathing.  She was seen in video visit on 04/02/2021 for increasing chest tightness, nonproductive cough and some wheezing.  She was provided with steroid taper and noted improvement in her cough and wheezing but she continues to experience chest tightness.  Pulmonary function test today shows mild obstructive defect with flow volume loop concerning for possible fixed airway obstruction versus possible variable extrathoracic airway obstruction.  OV 02/24/21 She has been using symbicort 160-4.37mcg 2 puffs twice daily but has noticed increase shortness of breath since having covid 19 in June. She was treated outpatient with an antiviral medication. She denies night time awakenings. She is mainly experiencing exertional dyspnea and does notice wheezing when she is walking for exercise.   She continues on medications for her GERD and sinus congestion/drainage with out issues.  OV 05/29/20 She presented to [facility] ER on 04/16/20 with sinus pressure, headaches, cough and sore throat. She tested negative for Covid 19. Chest radiograph was unremarkable as well as BMP and CBC. She was provided with azelastine nasal spray and discharged home. She complains of cough since her ER visit that is dry. She has congestion with post-nasal drip. She has some sinus congestion and ear discomfort. She has shortness of breath with exertion and with coughing episodes. She has history of hiatal hernia and  is on pantoprazole and famotidine for GERD. She reports a substernal chest discomfort that is not worse with exertion or associated with nausea or diaphoresis.   She is taking symbicort 160-4.55mcg 2 puffs twice daily and does not complain of frequent wheezing. She is on montelukast for asthma and allergies. She is on flonase daily.   She has seen Dr. Wilburn Cornelia in ENT perviously for pansinusitis, last seen in 06/2018.          Past Medical History:  Diagnosis Date   Acid reflux disease    Allergic rhinitis    Anxiety    Asthma    Barrett esophagus    Dyspnea    Dysthymia    Hypercholesterolemia    Hypertension    Irritable bowel syndrome    Nephrolithiasis    Osteoporosis    Other chest pain    Status post dilation of esophageal narrowing      Family History  Problem Relation Age of Onset   Heart disease Father    Heart disease Mother    Diabetes Mother    Diabetes Sister    Diabetes Brother    Cancer Maternal Uncle        type unknown   Throat cancer Paternal Uncle    Heart attack Sister    Colon cancer Neg Hx    Stomach cancer Neg Hx    Rectal cancer Neg Hx    Pancreatic cancer Neg Hx      Social History   Socioeconomic History   Marital status: Married    Spouse name: Not on file   Number of  children: 2   Years of education: Not on file   Highest education level: Not on file  Occupational History   Occupation: retired  Tobacco Use   Smoking status: Never   Smokeless tobacco: Never  Vaping Use   Vaping Use: Never used  Substance and Sexual Activity   Alcohol use: Yes    Alcohol/week: 3.0 standard drinks    Types: 3 Standard drinks or equivalent per week    Comment: social   Drug use: No   Sexual activity: Yes    Partners: Male  Other Topics Concern   Not on file  Social History Narrative   Not on file   Social Determinants of Health   Financial Resource Strain: Not on file  Food Insecurity: Not on file  Transportation Needs: Not on file   Physical Activity: Not on file  Stress: Not on file  Social Connections: Not on file  Intimate Partner Violence: Not on file     Allergies  Allergen Reactions   Bupropion Hcl Swelling    WELLBUTRIN   Doxycycline Hyclate Other (See Comments)    upset  stomach   Erythromycin Ethylsuccinate Other (See Comments)     upset stomach     Outpatient Medications Prior to Visit  Medication Sig Dispense Refill   albuterol (VENTOLIN HFA) 108 (90 Base) MCG/ACT inhaler 1-2 puff as needed 18 g 3   amLODipine (NORVASC) 10 MG tablet TAKE 1 TABLET(10 MG) BY MOUTH DAILY 90 tablet 2   Artificial Tear Solution (TEARS NATURALE OP) Place 1 drop into both eyes See admin instructions. Use 1 drop into both eyes (scheduled) in the morning & may use 3 times daily if needed for dry /irritated eyes.     atorvastatin (LIPITOR) 40 MG tablet Take 1 tablet (40 mg total) by mouth daily. Please make overdue appt with Dr. [name] before anymore refills. Thank you 2nd attempt 15 tablet 0   azelastine (OPTIVAR) 0.05 % ophthalmic solution Place 1 drop into both eyes daily as needed for allergies.     Azelastine HCl 0.15 % SOLN USE 2 SPRAYS IN EACH NOSTRIL IN THE MORNING AND AT BEDTIME 30 mL 0   benzonatate (TESSALON PERLES) 100 MG capsule Take 1 capsule (100 mg total) by mouth 3 (three) times daily as needed. 20 capsule 0   cholecalciferol (VITAMIN D) 25 MCG (1000 UT) tablet Take 1,000 Units by mouth at bedtime.     clonazePAM (KLONOPIN) 0.25 MG disintegrating tablet DISSOLVE ONE TABLET BY MOUTH EVERY DAY AS NEEDED 30 tablet 0   EPIPEN 2-PAK 0.3 MG/0.3ML SOAJ injection Inject 0.3 mg into the muscle as needed for anaphylaxis.   1   escitalopram (LEXAPRO) 10 MG tablet Take 1 tablet (10 mg total) by mouth daily. 90 tablet 3   estrogen, conjugated,-medroxyprogesterone (PREMPRO) 0.45-1.5 MG per tablet Take 1 tablet by mouth at bedtime.      fluticasone (FLONASE) 50 MCG/ACT nasal spray Place 2 sprays into both nostrils daily.      ipratropium (ATROVENT) 0.03 % nasal spray Place 2 sprays into both nostrils every 12 (twelve) hours. 30 mL 12   montelukast (SINGULAIR) 10 MG tablet Take 1 tablet (10 mg total) by mouth at bedtime. 90 tablet 3   Olopatadine HCl 0.7 % SOLN Place 1 drop into both eyes daily as needed (allergies.).      pantoprazole (PROTONIX) 40 MG tablet Take 1 tablet (40 mg total) by mouth daily. 180 tablet 3   polyethylene glycol (MIRALAX / GLYCOLAX)  17 g packet Take 17 g by mouth daily as needed for mild constipation.      predniSONE (DELTASONE) 10 MG tablet Prednisone taper; 10 mg tablets: 4 tabs x 2 days, 3 tabs x 2 days, 2 tabs x 2 days 1 tab x 2 days then stop. 20 tablet 0   Probiotic Product (PROBIOTIC COLON SUPPORT PO) Take 1 capsule by mouth at bedtime.      Spacer/Aero-Holding Chambers (VALVED HOLDING CHAMBER) DEVI SMARTSIG:Inhalation Via Inhaler As Directed     Budeson-Glycopyrrol-Formoterol (BREZTRI AEROSPHERE) 160-9-4.8 MCG/ACT AERO Inhale 2 puffs into the lungs 2 (two) times daily. 5.9 g 0   triamcinolone cream (KENALOG) 0.1 % Apply 1 application topically 2 (two) times daily. (Patient not taking: Reported on 04/02/2021) 100 g 0   No facility-administered medications prior to visit.    Review of Systems  Constitutional:  Negative for chills, fever, malaise/fatigue and weight loss.  HENT:  Negative for congestion, sinus pain and sore throat.   Eyes: Negative.   Respiratory:  Positive for shortness of breath. Negative for cough, hemoptysis, sputum production and wheezing.   Cardiovascular:  Negative for chest pain, palpitations, orthopnea, claudication and leg swelling.       Chest tightness  Gastrointestinal:  Negative for abdominal pain, heartburn, nausea and vomiting.  Genitourinary: Negative.   Musculoskeletal:  Negative for joint pain and myalgias.  Skin:  Negative for rash.  Neurological:  Negative for weakness.  Endo/Heme/Allergies: Negative.   Psychiatric/Behavioral: Negative.      Objective:   Vitals:   04/08/21 1206  BP: 118/70  Pulse: 95  SpO2: 97%  Weight: 142 lb (64.4 kg)  Height: 5\' 3"  (1.6 m)   Physical Exam Constitutional:      General: She is not in acute distress.    Appearance: Normal appearance. She is not ill-appearing.  HENT:     Head: Normocephalic and atraumatic.  Eyes:     General: No scleral icterus.    Conjunctiva/sclera: Conjunctivae normal.     Pupils: Pupils are equal, round, and reactive to light.  Cardiovascular:     Rate and Rhythm: Normal rate and regular rhythm.     Pulses: Normal pulses.     Heart sounds: Normal heart sounds. No murmur heard. Pulmonary:     Effort: Pulmonary effort is normal.     Breath sounds: Normal breath sounds. No wheezing, rhonchi or rales.  Musculoskeletal:     Right lower leg: No edema.     Left lower leg: No edema.  Skin:    General: Skin is warm and dry.  Neurological:     General: No focal deficit present.     Mental Status: She is alert.   CBC    Component Value Date/Time   WBC 4.7 01/21/2021 1141   RBC 4.30 01/21/2021 1141   HGB 13.8 01/21/2021 1141   HCT 40.2 01/21/2021 1141   PLT 239.0 01/21/2021 1141   MCV 93.3 01/21/2021 1141   MCH 31.8 04/16/2020 0801   MCHC 34.3 01/21/2021 1141   RDW 13.5 01/21/2021 1141   LYMPHSABS 1.5 04/16/2020 0801   MONOABS 0.7 04/16/2020 0801   EOSABS 0.0 04/16/2020 0801   BASOSABS 0.0 04/16/2020 0801   BMP Latest Ref Rng & Units 01/21/2021 09/04/2020 04/16/2020  Glucose 70 - 99 mg/dL 106(H) 106(H) 106(H)  BUN 6 - 23 mg/dL 16 18 14   Creatinine 0.40 - 1.20 mg/dL 0.93 0.93 0.84  BUN/Creat Ratio 6 - 22 (calc) - - -  Sodium 135 -  145 mEq/L 139 139 139  Potassium 3.5 - 5.1 mEq/L 3.9 3.6 3.7  Chloride 96 - 112 mEq/L 104 105 103  CO2 19 - 32 mEq/L 28 26 25   Calcium 8.4 - 10.5 mg/dL 9.3 9.4 9.2   Chest imaging: CXR 02/24/21 Increased interstitial markings bilaterally  CXR 04/16/20 The heart size and mediastinal contours are within normal  limits. Both lungs are clear. No pleural effusion. The visualized skeletal structures are unremarkable.  PFT: PFT Results Latest Ref Rng & Units 04/08/2021  FVC-Pre L 2.74  FVC-Predicted Pre % 96  FVC-Post L 2.77  FVC-Predicted Post % 97  Pre FEV1/FVC % % 66  Post FEV1/FCV % % 50  FEV1-Pre L 1.82  FEV1-Predicted Pre % 85  FEV1-Post L 1.37  DLCO uncorrected ml/min/mmHg 22.11  DLCO UNC% % 117  DLCO corrected ml/min/mmHg 22.11  DLCO COR %Predicted % 117  DLVA Predicted % 106  TLC L 5.14  TLC % Predicted % 104  RV % Predicted % 92    Labs:  Path:  Echo:  Heart Catheterization:  Assessment & Plan:   Moderate asthma with exacerbation, unspecified whether persistent - Plan: CT Chest High Resolution, Budeson-Glycopyrrol-Formoterol (BREZTRI AEROSPHERE) 160-9-4.8 MCG/ACT AERO  Gastroesophageal reflux disease without esophagitis  Hiatal hernia  Shortness of breath - Plan: CT Chest High Resolution  Abnormal PFT - Plan: CT Chest High Resolution  Discussion: Sheri Andrews is a 70 year old woman, never smoker with history of allergic rhinitis, asthma and GERD who returns to pulmonary clinic for asthma.   She has mild obstructive defect on PFTs with flow volume loops concerning for either fixed airway obstruction versus variable extrathoracic airway obstruction.  We will obtain high-resolution CT chest scan for further evaluation.  She is to continue Breztri inhaler 2 puffs twice daily and complete her steroid taper as prescribed.  We will evaluate her hiatal hernia further with the CT chest scan.  She is to continue taking PPI 30 minutes prior to breakfast And famotidine at bedtime.  Follow up in 2 months.  Freda Jackson, MD [facility] Office: [phone]      Current Outpatient Medications:    albuterol (VENTOLIN HFA) 108 (90 Base) MCG/ACT inhaler, 1-2 puff as needed, Disp: 18 g, Rfl: 3   amLODipine (NORVASC) 10 MG tablet, TAKE 1 TABLET(10  MG) BY MOUTH DAILY, Disp: 90 tablet, Rfl: 2   Artificial Tear Solution (TEARS NATURALE OP), Place 1 drop into both eyes See admin instructions. Use 1 drop into both eyes (scheduled) in the morning & may use 3 times daily if needed for dry /irritated eyes., Disp: , Rfl:    atorvastatin (LIPITOR) 40 MG tablet, Take 1 tablet (40 mg total) by mouth daily. Please make overdue appt with Dr. [name] before anymore refills. Thank you 2nd attempt, Disp: 15 tablet, Rfl: 0   azelastine (OPTIVAR) 0.05 % ophthalmic solution, Place 1 drop into both eyes daily as needed for allergies., Disp: , Rfl:    Azelastine HCl 0.15 % SOLN, USE 2 SPRAYS IN EACH NOSTRIL IN THE MORNING AND AT BEDTIME, Disp: 30 mL, Rfl: 0   benzonatate (TESSALON PERLES) 100 MG capsule, Take 1 capsule (100 mg total) by mouth 3 (three) times daily as needed., Disp: 20 capsule, Rfl: 0   cholecalciferol (VITAMIN D) 25 MCG (1000 UT) tablet, Take 1,000 Units by mouth at bedtime., Disp: , Rfl:    clonazePAM (KLONOPIN) 0.25 MG disintegrating tablet, DISSOLVE ONE TABLET BY MOUTH EVERY DAY AS  NEEDED, Disp: 30 tablet, Rfl: 0   EPIPEN 2-PAK 0.3 MG/0.3ML SOAJ injection, Inject 0.3 mg into the muscle as needed for anaphylaxis. , Disp: , Rfl: 1   escitalopram (LEXAPRO) 10 MG tablet, Take 1 tablet (10 mg total) by mouth daily., Disp: 90 tablet, Rfl: 3   estrogen, conjugated,-medroxyprogesterone (PREMPRO) 0.45-1.5 MG per tablet, Take 1 tablet by mouth at bedtime. , Disp: , Rfl:    fluticasone (FLONASE) 50 MCG/ACT nasal spray, Place 2 sprays into both nostrils daily., Disp: , Rfl:    ipratropium (ATROVENT) 0.03 % nasal spray, Place 2 sprays into both nostrils every 12 (twelve) hours., Disp: 30 mL, Rfl: 12   montelukast (SINGULAIR) 10 MG tablet, Take 1 tablet (10 mg total) by mouth at bedtime., Disp: 90 tablet, Rfl: 3   Olopatadine HCl 0.7 % SOLN, Place 1 drop into both eyes daily as needed (allergies.). , Disp: , Rfl:    pantoprazole (PROTONIX) 40 MG tablet, Take 1  tablet (40 mg total) by mouth daily., Disp: 180 tablet, Rfl: 3   polyethylene glycol (MIRALAX / GLYCOLAX) 17 g packet, Take 17 g by mouth daily as needed for mild constipation. , Disp: , Rfl:    predniSONE (DELTASONE) 10 MG tablet, Prednisone taper; 10 mg tablets: 4 tabs x 2 days, 3 tabs x 2 days, 2 tabs x 2 days 1 tab x 2 days then stop., Disp: 20 tablet, Rfl: 0   Probiotic Product (PROBIOTIC COLON SUPPORT PO), Take 1 capsule by mouth at bedtime. , Disp: , Rfl:    Spacer/Aero-Holding Chambers (VALVED HOLDING CHAMBER) DEVI, SMARTSIG:Inhalation Via Inhaler As Directed, Disp: , Rfl:    Budeson-Glycopyrrol-Formoterol (BREZTRI AEROSPHERE) 160-9-4.8 MCG/ACT AERO, Inhale 2 puffs into the lungs 2 (two) times daily., Disp: 5.9 g, Rfl: 0

## 2021-04-08 NOTE — Progress Notes (Signed)
Full PFT performed today. °

## 2021-04-08 NOTE — Patient Instructions (Signed)
Full PFT performed today. °

## 2021-04-09 ENCOUNTER — Other Ambulatory Visit: Payer: Self-pay | Admitting: Family Medicine

## 2021-04-09 ENCOUNTER — Telehealth: Payer: Self-pay | Admitting: Pulmonary Disease

## 2021-04-09 ENCOUNTER — Other Ambulatory Visit: Payer: Self-pay | Admitting: Pulmonary Disease

## 2021-04-09 DIAGNOSIS — J45901 Unspecified asthma with (acute) exacerbation: Secondary | ICD-10-CM

## 2021-04-09 MED ORDER — BREZTRI AEROSPHERE 160-9-4.8 MCG/ACT IN AERO: 2.0000 | INHALATION_SPRAY | Freq: Two times a day (BID) | RESPIRATORY_TRACT | 11 refills | Status: DC

## 2021-04-09 MED ORDER — BENZONATATE 100 MG PO CAPS: 100.0000 mg | ORAL_CAPSULE | Freq: Three times a day (TID) | ORAL | 0 refills | Status: AC | PRN

## 2021-04-09 NOTE — Telephone Encounter (Signed)
I called and spoke with the pt  She is asking for a refills on breztri and tessalon  Rxs sent to preferred pharm  Nothing further needed

## 2021-04-16 ENCOUNTER — Telehealth: Payer: Self-pay | Admitting: Cardiovascular Disease

## 2021-04-16 MED ORDER — ATORVASTATIN CALCIUM 40 MG PO TABS: 40.0000 mg | ORAL_TABLET | Freq: Every day | ORAL | 0 refills | Status: DC

## 2021-04-16 NOTE — Telephone Encounter (Signed)
°*  STAT* If patient is at the pharmacy, call can be transferred to refill team.   1. Which medications need to be refilled? (please list name of each medication and dose if known) atorvastatin (LIPITOR) 40 MG tablet  2. Which pharmacy/location (including street and city if local pharmacy) is medication to be sent to? [place], [place] - [address] AT [place]  3. Do they need a 30 day or 90 day supply? 90  Patient is out of medication. Please advise

## 2021-04-16 NOTE — Telephone Encounter (Signed)
Pt's medication was sent to pt's pharmacy as requested. Confirmation received.  °

## 2021-04-18 DIAGNOSIS — J3089 Other allergic rhinitis: Secondary | ICD-10-CM | POA: Diagnosis not present

## 2021-04-18 DIAGNOSIS — J301 Allergic rhinitis due to pollen: Secondary | ICD-10-CM | POA: Diagnosis not present

## 2021-04-21 ENCOUNTER — Encounter: Payer: Self-pay | Admitting: Pulmonary Disease

## 2021-04-22 NOTE — Telephone Encounter (Signed)
Please advise on prescription for thrush.

## 2021-04-23 ENCOUNTER — Telehealth: Payer: Self-pay | Admitting: Pulmonary Disease

## 2021-04-23 DIAGNOSIS — B370 Candidal stomatitis: Secondary | ICD-10-CM

## 2021-04-23 MED ORDER — NYSTATIN 100000 UNIT/ML MT SUSP: 5.0000 mL | Freq: Four times a day (QID) | OROMUCOSAL | 0 refills | Status: DC

## 2021-04-23 NOTE — Telephone Encounter (Signed)
Spoke to patient, who is requesting a Rx for thrush.  C/o white patches and sore mouth x21mo. She recently started rinsing her mouth after using breztri.  Dr. Erin Fulling, please advise. Thanks

## 2021-04-23 NOTE — Telephone Encounter (Signed)
Nystatin swish and swallow prescription sent. To be used 4 times daily for 7-14 days.  If patient does not have a spacer please provide her with one.  JD

## 2021-04-23 NOTE — Telephone Encounter (Signed)
Called and spoke with patient. She verbalized understanding.   Nothing further needed at time of call.  

## 2021-04-28 ENCOUNTER — Other Ambulatory Visit: Payer: Self-pay | Admitting: Pulmonary Disease

## 2021-04-28 DIAGNOSIS — B370 Candidal stomatitis: Secondary | ICD-10-CM

## 2021-04-29 ENCOUNTER — Other Ambulatory Visit: Payer: Self-pay

## 2021-04-29 ENCOUNTER — Ambulatory Visit (INDEPENDENT_AMBULATORY_CARE_PROVIDER_SITE_OTHER)
Admission: RE | Admit: 2021-04-29 | Discharge: 2021-04-29 | Disposition: A | Discharge status: Home / Self Care | Payer: PPO | Source: Ambulatory Visit | Attending: Pulmonary Disease | Admitting: Pulmonary Disease

## 2021-04-29 ENCOUNTER — Other Ambulatory Visit: Payer: Self-pay | Admitting: Pulmonary Disease

## 2021-04-29 DIAGNOSIS — B370 Candidal stomatitis: Secondary | ICD-10-CM

## 2021-04-29 DIAGNOSIS — R0602 Shortness of breath: Secondary | ICD-10-CM

## 2021-04-29 DIAGNOSIS — R942 Abnormal results of pulmonary function studies: Secondary | ICD-10-CM

## 2021-04-29 DIAGNOSIS — J45901 Unspecified asthma with (acute) exacerbation: Secondary | ICD-10-CM | POA: Diagnosis not present

## 2021-04-29 DIAGNOSIS — J479 Bronchiectasis, uncomplicated: Secondary | ICD-10-CM | POA: Diagnosis not present

## 2021-04-29 DIAGNOSIS — I2510 Atherosclerotic heart disease of native coronary artery without angina pectoris: Secondary | ICD-10-CM | POA: Diagnosis not present

## 2021-04-29 DIAGNOSIS — I700 Atherosclerosis of aorta: Secondary | ICD-10-CM | POA: Diagnosis not present

## 2021-04-29 NOTE — Telephone Encounter (Signed)
Dr. Erin Fulling, please advise on pt's email. She is requesting a refill as her symptoms have improved but not resolved. Thanks.

## 2021-04-30 ENCOUNTER — Other Ambulatory Visit: Payer: Self-pay | Admitting: Pulmonary Disease

## 2021-04-30 ENCOUNTER — Encounter (INDEPENDENT_AMBULATORY_CARE_PROVIDER_SITE_OTHER): Payer: PPO | Admitting: Ophthalmology

## 2021-04-30 DIAGNOSIS — B370 Candidal stomatitis: Secondary | ICD-10-CM

## 2021-04-30 MED ORDER — NYSTATIN 100000 UNIT/ML MT SUSP: 5.0000 mL | Freq: Four times a day (QID) | OROMUCOSAL | 0 refills | Status: DC

## 2021-04-30 NOTE — Addendum Note (Signed)
Addended by: Collier Salina on: 04/30/2021 09:31 PM   Modules accepted: Orders

## 2021-05-01 ENCOUNTER — Encounter (INDEPENDENT_AMBULATORY_CARE_PROVIDER_SITE_OTHER): Payer: PPO | Admitting: Ophthalmology

## 2021-05-04 ENCOUNTER — Encounter: Payer: Self-pay | Admitting: Pulmonary Disease

## 2021-05-06 ENCOUNTER — Encounter: Payer: Self-pay | Admitting: Internal Medicine

## 2021-05-06 MED ORDER — FLUCONAZOLE 150 MG PO TABS: 150.0000 mg | ORAL_TABLET | Freq: Once | ORAL | 1 refills | Status: AC

## 2021-05-12 ENCOUNTER — Other Ambulatory Visit: Payer: Self-pay

## 2021-05-12 ENCOUNTER — Ambulatory Visit: Payer: PPO | Admitting: Adult Health

## 2021-05-12 ENCOUNTER — Encounter: Payer: Self-pay | Admitting: Adult Health

## 2021-05-12 VITALS — BP 104/70 | HR 107 | Temp 98.3°F | Ht 63.5 in | Wt 145.6 lb

## 2021-05-12 DIAGNOSIS — J4530 Mild persistent asthma, uncomplicated: Secondary | ICD-10-CM

## 2021-05-12 DIAGNOSIS — J849 Interstitial pulmonary disease, unspecified: Secondary | ICD-10-CM | POA: Diagnosis not present

## 2021-05-12 MED ORDER — BUDESONIDE-FORMOTEROL FUMARATE 160-4.5 MCG/ACT IN AERO: 2.0000 | INHALATION_SPRAY | Freq: Two times a day (BID) | RESPIRATORY_TRACT | 6 refills | Status: DC

## 2021-05-12 NOTE — Progress Notes (Signed)
_0  ID: Sheri Andrews, female    DOB: [birth year], 71 y.o.   MRN: [id number]  Chief Complaint  Patient presents with   Follow-up    Referring provider: [name], *  HPI: 71 year old female never smoker followed for asthma and allergic rhinitis  TEST/EVENTS :   05/12/2021 Follow up : Asthma  Patient returns for a 1 month follow-up.  Patient last visit was having ongoing symptoms of asthma with dry cough and shortness of breath.  Patient was tried on [place] which she says may have helped some but really has not noticed that much difference.  Would like to go back to Symbicort as Judithann Sauger is not affordable.  Patient complains that her breathing became bad in fall 2019.  She was on a 3-week vacation in [place].  She took a cruise visiting [place], [place] in [place].  She said several people were sick on the cruise.  Ever since then she has had ongoing breathing problems and was diagnosed with asthma.  She has had progressive shortness of breath, decreased activity tolerance.  She says prior to 2019 she was able to walk several miles a day.  She continues to exercise and walk daily but is only able to get in a mile or 2 without having significant shortness of breath.  Cough is dry in nature minimally productive. Patient was set up for a high-resolution CT chest this was completed on April 29, 2021 that showed patchy areas of groundglass attenuation, septal thickening and parenchymal banding in the lungs bilaterally.  Most evident in the mid to lower lungs.  Very mild traction bronchiectasis.  No frank honeycombing.  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  Findings notable progression since CT chest in October 2021. PFTs done in December 2022 showed mild airflow obstruction with an FEV1 at 85%, ratio 66, FVC 96%, no significant bronchodilator response, DLCO 117%. Patient has no history of autoimmune disease.  Has no family history of autoimmune.   She denies any joint pain swelling or rash.  She is a retired Pharmacist, hospital.  Also worked in [employer].  She is a never smoker.  Denies any alcohol or drug use.  Patient has no basement or hot tub.  No birds or chickens.  No pets. She does travel extensively.  In 2019 went to [place] as above.  In 2022 went to [place], [place] and [place].  She denies any down comforters or pillows.  She is from [place].  She has no unusual hobbies. No previous use of methotrexate, amiodarone or Macrodantin.   Last visit patient had had oral candidiasis felt secondary to her inhaler.  She was given nystatin.  And instructed on inhaler use.  Patient says her symptoms have resolved.     Allergies  Allergen Reactions   Bupropion Hcl Swelling    WELLBUTRIN   Doxycycline Hyclate Other (See Comments)    upset  stomach   Erythromycin Ethylsuccinate Other (See Comments)     upset stomach    Immunization History  Administered Date(s) Administered   Fluad Quad(high Dose 65+) 01/21/2021   Influenza Split 02/01/2012   Influenza Whole 01/31/2009   Influenza, High Dose Seasonal PF 02/17/2016, 01/25/2017, 01/23/2019, 01/30/2020   Influenza,inj,Quad PF,6+ Mos 03/28/2015   Influenza,inj,quad, With Preservative 01/19/2019   Influenza-Unspecified 02/01/2017   PFIZER(Purple Top)SARS-COV-2 Vaccination 05/25/2019, 06/21/2019, 02/27/2020   Pneumococcal Conjugate-13 03/28/2015   Pneumococcal Polysaccharide-23 11/11/2015, 05/14/2016, 01/30/2020   Tdap 08/17/2011   Zoster Recombinat (Shingrix) 07/14/2017, 11/11/2017  Past Medical History:  Diagnosis Date   Acid reflux disease    Allergic rhinitis    Anxiety    Asthma    Barrett esophagus    Dyspnea    Dysthymia    Hypercholesterolemia    Hypertension    Irritable bowel syndrome    Nephrolithiasis    Osteoporosis    Other chest pain    Status post dilation of esophageal narrowing     Tobacco History: Social History   Tobacco Use  Smoking Status Never   Smokeless Tobacco Never   Counseling given: Not Answered   Outpatient Medications Prior to Visit  Medication Sig Dispense Refill   albuterol (VENTOLIN HFA) 108 (90 Base) MCG/ACT inhaler 1-2 puff as needed 18 g 3   amLODipine (NORVASC) 10 MG tablet TAKE 1 TABLET(10 MG) BY MOUTH DAILY 90 tablet 2   Artificial Tear Solution (TEARS NATURALE OP) Place 1 drop into both eyes See admin instructions. Use 1 drop into both eyes (scheduled) in the morning & may use 3 times daily if needed for dry /irritated eyes.     atorvastatin (LIPITOR) 40 MG tablet Take 1 tablet (40 mg total) by mouth daily. Please keep upcoming appt with Dr. [name] in April 2023 before anymore refills. Thank you Final attempt 90 tablet 0   azelastine (OPTIVAR) 0.05 % ophthalmic solution Place 1 drop into both eyes daily as needed for allergies.     Azelastine HCl 0.15 % SOLN USE 2 SPRAYS IN EACH NOSTRIL IN THE MORNING AND AT BEDTIME 30 mL 0   cholecalciferol (VITAMIN D) 25 MCG (1000 UT) tablet Take 1,000 Units by mouth at bedtime.     clonazePAM (KLONOPIN) 0.25 MG disintegrating tablet DISSOLVE ONE TABLET BY MOUTH EVERY DAY AS NEEDED 30 tablet 0   EPIPEN 2-PAK 0.3 MG/0.3ML SOAJ injection Inject 0.3 mg into the muscle as needed for anaphylaxis.   1   escitalopram (LEXAPRO) 10 MG tablet Take 1 tablet (10 mg total) by mouth daily. 90 tablet 3   estrogen, conjugated,-medroxyprogesterone (PREMPRO) 0.45-1.5 MG per tablet Take 1 tablet by mouth at bedtime.      fluticasone (FLONASE) 50 MCG/ACT nasal spray Place 2 sprays into both nostrils daily.     ipratropium (ATROVENT) 0.03 % nasal spray Place 2 sprays into both nostrils every 12 (twelve) hours. 30 mL 12   montelukast (SINGULAIR) 10 MG tablet Take 1 tablet (10 mg total) by mouth at bedtime. 90 tablet 3   Olopatadine HCl 0.7 % SOLN Place 1 drop into both eyes daily as needed (allergies.).      pantoprazole (PROTONIX) 40 MG tablet Take 1 tablet (40 mg total) by mouth daily. 180 tablet 3    polyethylene glycol (MIRALAX / GLYCOLAX) 17 g packet Take 17 g by mouth daily as needed for mild constipation.      Probiotic Product (PROBIOTIC COLON SUPPORT PO) Take 1 capsule by mouth at bedtime.      Spacer/Aero-Holding Chambers (VALVED HOLDING CHAMBER) DEVI SMARTSIG:Inhalation Via Inhaler As Directed     Budeson-Glycopyrrol-Formoterol (BREZTRI AEROSPHERE) 160-9-4.8 MCG/ACT AERO Inhale 2 puffs into the lungs 2 (two) times daily. 10.7 g 11   benzonatate (TESSALON PERLES) 100 MG capsule Take 1 capsule (100 mg total) by mouth 3 (three) times daily as needed. (Patient not taking: Reported on 05/12/2021) 30 capsule 0   nystatin (MYCOSTATIN) 100000 UNIT/ML suspension Take 5 mLs (500,000 Units total) by mouth 4 (four) times daily. (Patient not taking: Reported on 05/12/2021) 60 mL 0  predniSONE (DELTASONE) 10 MG tablet Prednisone taper; 10 mg tablets: 4 tabs x 2 days, 3 tabs x 2 days, 2 tabs x 2 days 1 tab x 2 days then stop. (Patient not taking: Reported on 05/12/2021) 20 tablet 0   No facility-administered medications prior to visit.     Review of Systems:   Constitutional:   No  weight loss, night sweats,  Fevers, chills,  +fatigue, or  lassitude.  HEENT:   No headaches,  Difficulty swallowing,  Tooth/dental problems, or  Sore throat,                No sneezing, itching, ear ache, nasal congestion, post nasal drip,   CV:  No chest pain,  Orthopnea, PND, swelling in lower extremities, anasarca, dizziness, palpitations, syncope.   GI  No heartburn, indigestion, abdominal pain, nausea, vomiting, diarrhea, change in bowel habits, loss of appetite, bloody stools.   Resp:   No chest wall deformity  Skin: no rash or lesions.  GU: no dysuria, change in color of urine, no urgency or frequency.  No flank pain, no hematuria   MS:  No joint pain or swelling.  No decreased range of motion.  No back pain.    Physical Exam  BP 104/70 (BP Location: Left Arm, Patient Position: Sitting, Cuff Size:  Normal)    Pulse (!) 107    Temp 98.3 F (36.8 C) (Oral)    Ht 5' 3.5" (1.613 m)    Wt 145 lb 9.6 oz (66 kg)    SpO2 98%    BMI 25.39 kg/m   GEN: A/Ox3; pleasant , NAD, well nourished    HEENT:  [place]/AT,   NOSE-clear, THROAT-clear, no lesions, no postnasal drip or exudate noted.  No evidence of thrush  NECK:  Supple w/ fair ROM; no JVD; normal carotid impulses w/o bruits; no thyromegaly or nodules palpated; no lymphadenopathy.    RESP  Clear  P & A; w/o, wheezes/ rales/ or rhonchi. no accessory muscle use, no dullness to percussion, faint bibasilar crackles.  CARD:  RRR, no m/r/g, no peripheral edema, pulses intact, no cyanosis or clubbing.  GI:   Soft & nt; nml bowel sounds; no organomegaly or masses detected.   Musco: Warm bil, no deformities or joint swelling noted.   Neuro: alert, no focal deficits noted.    Skin: Warm, no lesions or rashes    Lab Results:  CBC    Component Value Date/Time   WBC 4.7 01/21/2021 1141   RBC 4.30 01/21/2021 1141   HGB 13.8 01/21/2021 1141   HCT 40.2 01/21/2021 1141   PLT 239.0 01/21/2021 1141   MCV 93.3 01/21/2021 1141   MCH 31.8 04/16/2020 0801   MCHC 34.3 01/21/2021 1141   RDW 13.5 01/21/2021 1141   LYMPHSABS 1.5 04/16/2020 0801   MONOABS 0.7 04/16/2020 0801   EOSABS 0.0 04/16/2020 0801   BASOSABS 0.0 04/16/2020 0801    BMET    Component Value Date/Time   NA 139 01/21/2021 1141   K 3.9 01/21/2021 1141   CL 104 01/21/2021 1141   CO2 28 01/21/2021 1141   GLUCOSE 106 (H) 01/21/2021 1141   BUN 16 01/21/2021 1141   CREATININE 0.93 01/21/2021 1141   CREATININE 0.99 11/24/2019 1140   CALCIUM 9.3 01/21/2021 1141   GFRNONAA >60 04/16/2020 0801   GFRAA  08/11/2008 0542    >60        The eGFR has been calculated using the MDRD equation. This calculation has not  been validated in all clinical situations. eGFR's persistently <60 mL/min signify possible Chronic Kidney Disease.    BNP No results found for: BNP  ProBNP No  results found for: PROBNP  Imaging: CT Chest High Resolution  Result Date: 04/29/2021 CLINICAL DATA:  71 year old female with history of shortness of breath. Abnormal pulmonary function tests. Asthma exacerbation. EXAM: CT CHEST WITHOUT CONTRAST TECHNIQUE: Multidetector CT imaging of the chest was performed following the standard protocol without intravenous contrast. High resolution imaging of the lungs, as well as inspiratory and expiratory imaging, was performed. COMPARISON:  Cardiac CT 01/25/2020.  Chest CTA 08/10/2008. FINDINGS: Cardiovascular: Heart size is normal. There is no significant pericardial fluid, thickening or pericardial calcification. There is aortic atherosclerosis, as well as atherosclerosis of the great vessels of the mediastinum and the coronary arteries, including calcified atherosclerotic plaque in the left main, left anterior descending and right coronary arteries. Mediastinum/Nodes: No pathologically enlarged mediastinal or hilar lymph nodes. Please note that accurate exclusion of hilar adenopathy is limited on noncontrast CT scans. Esophagus is unremarkable in appearance. No axillary lymphadenopathy. Lungs/Pleura: High-resolution images demonstrates some patchy areas of ground-glass attenuation, septal thickening and parenchymal banding in the lungs bilaterally, most evident in the mid to lower lungs. Very mild cylindrical traction bronchiectasis and peripheral bronchiolectasis is also noted. No frank honeycombing. Inspiratory and expiratory imaging demonstrates some mild air trapping indicative of mild small airways disease. No acute consolidative airspace disease. No pleural effusions. No definite suspicious appearing pulmonary nodules or masses are noted. Upper Abdomen: Aortic atherosclerosis. Musculoskeletal: There are no aggressive appearing lytic or blastic lesions noted in the visualized portions of the skeleton. IMPRESSION: 1. The appearance of the lungs is compatible with  interstitial lung disease, with a spectrum of findings considered probable usual interstitial pneumonia (UIP) per current ATS guidelines. These findings are clearly progressive compared to prior cardiac CT 01/25/2020. Repeat high-resolution chest CT is suggested in 12 months to assess for temporal changes in the appearance of the lung parenchyma. 2. Aortic atherosclerosis, in addition to left main and 2 vessel coronary artery disease. Please note that although the presence of coronary artery calcium documents the presence of coronary artery disease, the severity of this disease and any potential stenosis cannot be assessed on this non-gated CT examination. Assessment for potential risk factor modification, dietary therapy or pharmacologic therapy may be warranted, if clinically indicated. Aortic Atherosclerosis (ICD10-I70.0). Electronically Signed   By: Vinnie Langton M.D.   On: 04/29/2021 12:17      PFT Results Latest Ref Rng & Units 04/08/2021  FVC-Pre L 2.74  FVC-Predicted Pre % 96  FVC-Post L 2.77  FVC-Predicted Post % 97  Pre FEV1/FVC % % 66  Post FEV1/FCV % % 50  FEV1-Pre L 1.82  FEV1-Predicted Pre % 85  FEV1-Post L 1.37  DLCO uncorrected ml/min/mmHg 22.11  DLCO UNC% % 117  DLCO corrected ml/min/mmHg 22.11  DLCO COR %Predicted % 117  DLVA Predicted % 106  TLC L 5.14  TLC % Predicted % 104  RV % Predicted % 92    No results found for: NITRICOXIDE      Assessment & Plan:   Asthma Mild to moderate persistent asthma.-Patient may return back to Symbicort as this is more affordable.  Had no significant change in symptom management with triple therapy inhaler.  Plan  Patient Instructions  Liam Graham , restart Symbicort 129m 2 puffs Twice daily, rinse after use. Use spacer  Albuterol inhaler as needed  Activity as tolerated.  Labs  today  Follow up with Dr. Erin Fulling next month as planned and As needed   Please contact office for sooner follow up if symptoms do not improve  or worsen or seek emergency care       ILD (interstitial lung disease) ([place]) High-resolution CT chest shows ILD with probable UIP pattern. Medical history is unrevealing.  No personal or family history of autoimmune/connective tissue.  Social and occupational history is unrevealing. Will check autoimmune/connective tissue labs today.  Pending these results we will discussed treatment plan on return visit. We went over in detail her CT scan results and imaging.  Patient education was provided.  Plan  Patient Instructions  Liam Graham , restart Symbicort 126m 2 puffs Twice daily, rinse after use. Use spacer  Albuterol inhaler as needed  Activity as tolerated.  Labs today  Follow up with Dr. DErin Fullingnext month as planned and As needed   Please contact office for sooner follow up if symptoms do not improve or worsen or seek emergency care       I spent   51 minutes dedicated to the care of this patient on the date of this encounter to include pre-visit review of records, face-to-face time with the patient discussing conditions above, post visit ordering of testing, clinical documentation with the electronic health record, making appropriate referrals as documented, and communicating necessary findings to members of the patients care team.    TRexene Edison NP 05/12/2021

## 2021-05-12 NOTE — Patient Instructions (Signed)
Finish BREZTRI , restart Symbicort 160mg  2 puffs Twice daily, rinse after use. Use spacer  Albuterol inhaler as needed  Activity as tolerated.  Labs today  Follow up with Dr. Erin Fulling next month as planned and As needed   Please contact office for sooner follow up if symptoms do not improve or worsen or seek emergency care

## 2021-05-12 NOTE — Assessment & Plan Note (Signed)
Mild to moderate persistent asthma.-Patient may return back to Symbicort as this is more affordable.  Had no significant change in symptom management with triple therapy inhaler.  Plan  Patient Instructions  Sheri Andrews , restart Symbicort 160mg  2 puffs Twice daily, rinse after use. Use spacer  Albuterol inhaler as needed  Activity as tolerated.  Labs today  Follow up with Sheri Andrews next month as planned and As needed   Please contact office for sooner follow up if symptoms do not improve or worsen or seek emergency care

## 2021-05-12 NOTE — Assessment & Plan Note (Signed)
High-resolution CT chest shows ILD with probable UIP pattern. Medical history is unrevealing.  No personal or family history of autoimmune/connective tissue.  Social and occupational history is unrevealing. Will check autoimmune/connective tissue labs today.  Pending these results we will discussed treatment plan on return visit. We went over in detail her CT scan results and imaging.  Patient education was provided.  Plan  Patient Instructions  Sheri Andrews , restart Symbicort 160mg  2 puffs Twice daily, rinse after use. Use spacer  Albuterol inhaler as needed  Activity as tolerated.  Labs today  Follow up with Dr. Erin Fulling next month as planned and As needed   Please contact office for sooner follow up if symptoms do not improve or worsen or seek emergency care

## 2021-05-13 LAB — SJOGRENS SYNDROME-B EXTRACTABLE NUCLEAR ANTIBODY: SSB (La) (ENA) Antibody, IgG: <1 AI

## 2021-05-13 NOTE — Progress Notes (Signed)
CARDIOLOGY CONSULT NOTE       Patient ID: Sheri Andrews MRN: [id number] DOB/AGE: August 06, 1950 71 y.o.  Admit date: (Not on file) Referring Physician: [name] Primary Physician: [name], MD Primary Cardiologist: New Reason for Consultation: Chest Pain  Active Problems:   * No active hospital problems. *   HPI:  71 y.o. referred by Dr [name] 01/26/20 for chest pain and family history of premature CAD 3/4 siblings have had MI She is being Rx with statin for HLD LDL 109 She had a normal Myovue in 2010 and normal ETT in March 2019 achieving 7 Mets and 102% PMHR She has had significant reflux and barrett's with esophageal stretching Rx with calcium blocker for HTN and carries a diagnosis of asthma with no smoking history   Coronary calcium score 8 in 2015 isolated to mid/distal LAD Coronary calcium score 72 01/25/20 70 th percentile   Likely had COVID last February in [place] and was sick for weeks travels a lot And was in [place] and [place] last year   ILD/Asthma  - sees Wellsite geologist feels better on Symbicort rather than Breztri 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   Retired Pharmacist, 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   She has had some chest tightness with her dyspnea CT showed LM and 2 vessel coronary calcium Last stress test was 3 years ago  ROS All other systems reviewed and negative except as noted above  Past Medical History:  Diagnosis Date   Acid reflux disease    Allergic rhinitis    Anxiety    Asthma    Barrett esophagus    Dyspnea    Dysthymia    Hypercholesterolemia    Hypertension    Irritable bowel syndrome    Nephrolithiasis    Osteoporosis    Other chest pain    Status post dilation of esophageal narrowing     Family History  Problem Relation Age of Onset   Heart disease Father    Heart disease Mother    Diabetes Mother    Diabetes Sister    Diabetes Brother    Cancer Maternal Uncle        type unknown   Throat cancer  Paternal Uncle    Heart attack Sister    Colon cancer Neg Hx    Stomach cancer Neg Hx    Rectal cancer Neg Hx    Pancreatic cancer Neg Hx     Social History   Socioeconomic History   Marital status: Married    Spouse name: Not on file   Number of children: 2   Years of education: Not on file   Highest education level: Not on file  Occupational History   Occupation: retired  Tobacco Use   Smoking status: Never   Smokeless tobacco: Never  Vaping Use   Vaping Use: Never used  Substance and Sexual Activity   Alcohol use: Yes    Alcohol/week: 3.0 standard drinks    Types: 3 Standard drinks or equivalent per week    Comment: social   Drug use: No   Sexual activity: Yes    Partners: Male  Other Topics Concern   Not on file  Social History Narrative   Not on file   Social Determinants of Health   Financial Resource Strain: Not on file  Food Insecurity: Not on file  Transportation Needs: Not on file  Physical Activity: Not on file  Stress: Not on file  Social Connections: Not on  file  Intimate Partner Violence: Not on file    Past Surgical History:  Procedure Laterality Date   ESOPHAGOGASTRODUODENOSCOPY (EGD) WITH PROPOFOL N/A 05/02/2019   Procedure: ESOPHAGOGASTRODUODENOSCOPY (EGD) WITH PROPOFOL;  Surgeon: Mauri Pole, MD;  Location: [facility];  Service: Endoscopy;  Laterality: N/A;  needing APC available   FACIAL COSMETIC SURGERY     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 Use 1 drop into both eyes (scheduled) in the morning & may use 3 times daily if needed for dry /irritated eyes., Disp: ,  Rfl:    atorvastatin (LIPITOR) 40 MG tablet, Take 1 tablet (40 mg total) by mouth daily. Please keep upcoming appt with Dr. [name] in April 2023 before anymore refills. Thank you Final attempt, Disp: 90 tablet, Rfl: 0   azelastine (OPTIVAR) 0.05 % ophthalmic solution, Place 1 drop into both eyes daily as needed for allergies., Disp: , Rfl:    Azelastine HCl 0.15 % SOLN, USE 2 SPRAYS IN EACH NOSTRIL IN THE MORNING AND AT BEDTIME, Disp: 30 mL, Rfl: 0   benzonatate (TESSALON PERLES) 100 MG capsule, Take 1 capsule (100 mg total) by mouth 3 (three) times daily as needed., Disp: 30 capsule, Rfl: 0   budesonide-formoterol (SYMBICORT) 160-4.5 MCG/ACT inhaler, Inhale 2 puffs into the lungs 2 (two) times daily., Disp: 1 each, Rfl: 6   cholecalciferol (VITAMIN D) 25 MCG (1000 UT) tablet, Take 1,000 Units by mouth at bedtime., Disp: , Rfl:    clonazePAM (KLONOPIN) 0.25 MG disintegrating tablet, DISSOLVE ONE TABLET BY MOUTH EVERY DAY AS NEEDED, Disp: 30 tablet, Rfl: 0   EPIPEN 2-PAK 0.3 MG/0.3ML SOAJ injection, Inject 0.3 mg into the muscle as needed for anaphylaxis. , Disp: , Rfl: 1   escitalopram (LEXAPRO) 10 MG tablet, Take 1 tablet (10 mg total) by mouth daily., Disp: 90 tablet, Rfl: 3   estrogen, conjugated,-medroxyprogesterone (PREMPRO) 0.45-1.5 MG per tablet, Take 1 tablet by mouth at bedtime. , Disp: , Rfl:    fluticasone (FLONASE) 50 MCG/ACT nasal spray, Place 2 sprays into both nostrils daily., Disp: , Rfl:    ipratropium (ATROVENT) 0.03 % nasal spray, Place 2 sprays into both nostrils every 12 (twelve) hours., Disp: 30 mL, Rfl: 12   montelukast (SINGULAIR) 10 MG tablet, Take 1 tablet (10 mg total) by mouth at bedtime., Disp: 90 tablet, Rfl: 3   Olopatadine HCl 0.7 % SOLN, Place 1 drop into both eyes daily as needed (allergies.). , Disp: , Rfl:    pantoprazole (PROTONIX) 40 MG tablet, Take 1 tablet (40 mg total) by mouth daily., Disp: 180 tablet, Rfl: 3   polyethylene glycol (MIRALAX / GLYCOLAX) 17 g  packet, Take 17 g by mouth daily as needed for mild constipation. , Disp: , Rfl:    Probiotic Product (PROBIOTIC COLON SUPPORT PO), Take 1 capsule by mouth at bedtime. , Disp: , Rfl:    Spacer/Aero-Holding Chambers (VALVED HOLDING CHAMBER) DEVI, SMARTSIG:Inhalation Via Inhaler As Directed, Disp: , Rfl:     Physical Exam: Blood pressure 110/72, pulse 94, height 5' 3.5" (1.613 m), weight 146 lb  6.4 oz (66.4 kg), SpO2 96 %.   Affect appropriate Healthy:  appears stated age 87: normal Neck supple with no adenopathy JVP normal no bruits no thyromegaly Lungs clear with no wheezing and good diaphragmatic motion Heart:  S1/S2 no murmur, no rub, gallop or click PMI normal Abdomen: benighn, BS positve, no tenderness, no AAA no bruit.  No HSM or HJR Distal pulses intact with no bruits No edema Neuro non-focal Skin warm and dry No muscular weakness   Labs:   Lab Results  Component Value Date   WBC 4.7 01/21/2021   HGB 13.8 01/21/2021   HCT 40.2 01/21/2021   MCV 93.3 01/21/2021   PLT 239.0 01/21/2021   No results for input(s): NA, K, CL, CO2, BUN, CREATININE, CALCIUM, PROT, BILITOT, ALKPHOS, ALT, AST, GLUCOSE in the last 168 hours.  Invalid input(s): LABALBU Lab Results  Component Value Date   CKTOTAL 79 08/11/2008   CKMB 0.7 08/11/2008   TROPONINI <0.01        NO INDICATION OF MYOCARDIAL INJURY. 08/11/2008    Lab Results  Component Value Date   CHOL 158 09/04/2020   CHOL 148 04/26/2020   CHOL 182 11/24/2019   Lab Results  Component Value Date   HDL 39.50 09/04/2020   HDL 32 (L) 04/26/2020   HDL 44 (L) 11/24/2019   Lab Results  Component Value Date   LDLCALC 94 09/04/2020   LDLCALC 89 04/26/2020   LDLCALC 109 (H) 11/24/2019   Lab Results  Component Value Date   TRIG 124.0 09/04/2020   TRIG 153 (H) 04/26/2020   TRIG 175 (H) 11/24/2019   Lab Results  Component Value Date   CHOLHDL 4 09/04/2020   CHOLHDL 4.6 (H) 04/26/2020   CHOLHDL 4.1 11/24/2019   Lab  Results  Component Value Date   LDLDIRECT 102.0 09/06/2018   LDLDIRECT 149.1 02/01/2012   LDLDIRECT 140.2 12/19/2009      Radiology: CT Chest High Resolution  Result Date: 04/29/2021 CLINICAL DATA:  71 year old female with history of shortness of breath. Abnormal pulmonary function tests. Asthma exacerbation. EXAM: CT CHEST WITHOUT CONTRAST TECHNIQUE: Multidetector CT imaging of the chest was performed following the standard protocol without intravenous contrast. High resolution imaging of the lungs, as well as inspiratory and expiratory imaging, was performed. COMPARISON:  Cardiac CT 01/25/2020.  Chest CTA 08/10/2008. FINDINGS: Cardiovascular: Heart size is normal. There is no significant pericardial fluid, thickening or pericardial calcification. There is aortic atherosclerosis, as well as atherosclerosis of the great vessels of the mediastinum and the coronary arteries, including calcified atherosclerotic plaque in the left main, left anterior descending and right coronary arteries. Mediastinum/Nodes: No pathologically enlarged mediastinal or hilar lymph nodes. Please note that accurate exclusion of hilar adenopathy is limited on noncontrast CT scans. Esophagus is unremarkable in appearance. No axillary lymphadenopathy. Lungs/Pleura: High-resolution images demonstrates some patchy areas of ground-glass attenuation, septal thickening and parenchymal banding in the lungs bilaterally, most evident in the mid to lower lungs. Very mild cylindrical traction bronchiectasis and peripheral bronchiolectasis is also noted. No frank honeycombing. Inspiratory and expiratory imaging demonstrates some mild air trapping indicative of mild small airways disease. No acute consolidative airspace disease. No pleural effusions. No definite suspicious appearing pulmonary nodules or masses are noted. Upper Abdomen: Aortic atherosclerosis. Musculoskeletal: There are no aggressive appearing lytic or blastic lesions noted in the  visualized portions of the skeleton. IMPRESSION: 1. The appearance of the lungs is compatible with interstitial lung disease, with a spectrum of findings considered probable usual  interstitial pneumonia (UIP) per current ATS guidelines. These findings are clearly progressive compared to prior cardiac CT 01/25/2020. Repeat high-resolution chest CT is suggested in 12 months to assess for temporal changes in the appearance of the lung parenchyma. 2. Aortic atherosclerosis, in addition to left main and 2 vessel coronary artery disease. Please note that although the presence of coronary artery calcium documents the presence of coronary artery disease, the severity of this disease and any potential stenosis cannot be assessed on this non-gated CT examination. Assessment for potential risk factor modification, dietary therapy or pharmacologic therapy may be warranted, if clinically indicated. Aortic Atherosclerosis (ICD10-I70.0). Electronically Signed   By: Vinnie Langton M.D.   On: 04/29/2021 12:17     EKG: SR rate 89 normal 01/25/20 05/15/2021 SR rate 94 normal    ASSESSMENT AND PLAN:   1. HLD:  Premature family history of CAD Calcium score 70 th percentile on statin target LDL < 70   2. Chest Pain:  Normal ETT in 2019 CRF;s HTN, HLD, and premature family history see above Likely tightness from ILD and new diagnosis of lung issues but will have her come back to do exercise myovue to r/o ischemia   3. ILD/Asthma:  No active wheezing continue symbicort She needs f/u with pulmonary to discuss ILD and possible relationship to rheumatic abnormality   4. GERD:  With esophageal dilatation low carb diet Continue pepcid and protonix   5. Rheum:  positive Sjogrens antibody   F/U pulmonary Ex Myovue  F/U cards 6 months   Signed: Jenkins Rouge 05/15/2021, 10:30 AM

## 2021-05-14 ENCOUNTER — Encounter: Payer: Self-pay | Admitting: Pulmonary Disease

## 2021-05-14 LAB — CYCLIC CITRUL PEPTIDE ANTIBODY, IGG: Cyclic Citrullin Peptide Ab: <16 UNITS

## 2021-05-14 LAB — ANTI-DNA ANTIBODY, DOUBLE-STRANDED: ds DNA Ab: 4 IU/mL

## 2021-05-14 LAB — SJOGRENS SYNDROME-A EXTRACTABLE NUCLEAR ANTIBODY: SSA (Ro) (ENA) Antibody, IgG: 6.4 AI — AB

## 2021-05-14 LAB — ANTI-NUCLEAR AB-TITER (ANA TITER): ANA Titer 1: 1:40 {titer} — ABNORMAL HIGH

## 2021-05-14 LAB — ANA: Anti Nuclear Antibody (ANA): POSITIVE — AB

## 2021-05-14 LAB — RHEUMATOID FACTOR: Rheumatoid fact SerPl-aCnc: <14 IU/mL (ref <14)

## 2021-05-14 LAB — ANTI-SCLERODERMA ANTIBODY: Scleroderma (Scl-70) (ENA) Antibody, IgG: <1 AI

## 2021-05-14 LAB — ANCA SCREEN W REFLEX TITER: ANCA Screen: NEGATIVE

## 2021-05-15 ENCOUNTER — Encounter: Payer: Self-pay | Admitting: *Deleted

## 2021-05-15 ENCOUNTER — Encounter: Payer: Self-pay | Admitting: Cardiovascular Disease

## 2021-05-15 ENCOUNTER — Other Ambulatory Visit: Payer: Self-pay

## 2021-05-15 ENCOUNTER — Ambulatory Visit: Payer: PPO | Admitting: Cardiovascular Disease

## 2021-05-15 VITALS — BP 110/72 | HR 94 | Ht 63.5 in | Wt 146.4 lb

## 2021-05-15 DIAGNOSIS — J849 Interstitial pulmonary disease, unspecified: Secondary | ICD-10-CM | POA: Diagnosis not present

## 2021-05-15 DIAGNOSIS — R079 Chest pain, unspecified: Secondary | ICD-10-CM | POA: Diagnosis not present

## 2021-05-15 DIAGNOSIS — E782 Mixed hyperlipidemia: Secondary | ICD-10-CM | POA: Diagnosis not present

## 2021-05-15 DIAGNOSIS — I10 Essential (primary) hypertension: Secondary | ICD-10-CM | POA: Diagnosis not present

## 2021-05-15 NOTE — Patient Instructions (Addendum)
Medication Instructions:  NO CHANGES *If you need a refill on your cardiac medications before your next appointment, please call your pharmacy*   Lab Work: NONE If you have labs (blood work) drawn today and your tests are completely normal, you will receive your results only by: [place] (if you have MyChart) OR A paper copy in the mail If you have any lab test that is abnormal or we need to change your treatment, we will call you to review the results.   Testing/Procedures: Your physician has requested that you have en exercise stress myoview. For further information please visit [email or web address]. Please follow instruction sheet, as given.    Follow-Up: At [facility], you and your health needs are our priority.  As part of our continuing mission to provide you with exceptional heart care, we have created designated Provider Care Teams.  These Care Teams include your primary Cardiologist (physician) and Advanced Practice Providers (APPs -  Physician Assistants and Nurse Practitioners) who all work together to provide you with the care you need, when you need it.  We recommend signing up for the patient portal called "MyChart".  Sign up information is provided on this After Visit Summary.  MyChart is used to connect with patients for Virtual Visits (Telemedicine).  Patients are able to view lab/test results, encounter notes, upcoming appointments, etc.  Non-urgent messages can be sent to your provider as well.   To learn more about what you can do with MyChart, go to [email or web address].    Your next appointment:   1 year(s)  The format for your next appointment:   In Person  Provider:   DR [name]     Other Instructions NONE

## 2021-05-15 NOTE — Telephone Encounter (Signed)
I have made the follow up and patient is aware of the appointment time. Nothing further needed.

## 2021-05-16 LAB — HYPERSENSITIVITY PNEUMONITIS
A. Pullulans Abs: NEGATIVE
A.Fumigatus #1 Abs: NEGATIVE
Micropolyspora faeni, IgG: NEGATIVE
Pigeon Serum Abs: NEGATIVE
Thermoact. Saccharii: NEGATIVE
Thermoactinomyces vulgaris, IgG: NEGATIVE

## 2021-05-19 ENCOUNTER — Other Ambulatory Visit: Payer: Self-pay

## 2021-05-19 ENCOUNTER — Other Ambulatory Visit: Payer: Self-pay | Admitting: *Deleted

## 2021-05-19 DIAGNOSIS — I10 Essential (primary) hypertension: Secondary | ICD-10-CM

## 2021-05-19 DIAGNOSIS — J849 Interstitial pulmonary disease, unspecified: Secondary | ICD-10-CM

## 2021-05-19 DIAGNOSIS — R079 Chest pain, unspecified: Secondary | ICD-10-CM

## 2021-05-19 DIAGNOSIS — E785 Hyperlipidemia, unspecified: Secondary | ICD-10-CM

## 2021-05-19 NOTE — Progress Notes (Signed)
Called and spoke with patient, advised of results/recommends per Rexene Edison NP.  She verbalized understanding.  She wants to be referred to either Dr. Lenna Gilford, whom she saw years ago on [place]. Or Dr. Rex Kras with [facility].  Advised I would put that in my note.

## 2021-05-21 DIAGNOSIS — Z79890 Hormone replacement therapy (postmenopausal): Secondary | ICD-10-CM | POA: Diagnosis not present

## 2021-05-21 DIAGNOSIS — Z01419 Encounter for gynecological examination (general) (routine) without abnormal findings: Secondary | ICD-10-CM | POA: Diagnosis not present

## 2021-05-21 DIAGNOSIS — Z13820 Encounter for screening for osteoporosis: Secondary | ICD-10-CM | POA: Diagnosis not present

## 2021-05-21 DIAGNOSIS — B3731 Acute candidiasis of vulva and vagina: Secondary | ICD-10-CM | POA: Diagnosis not present

## 2021-05-22 ENCOUNTER — Ambulatory Visit: Payer: PPO | Admitting: Pulmonary Disease

## 2021-05-22 ENCOUNTER — Encounter: Payer: Self-pay | Admitting: Internal Medicine

## 2021-05-27 ENCOUNTER — Telehealth (HOSPITAL_COMMUNITY): Payer: Self-pay

## 2021-05-27 NOTE — Telephone Encounter (Signed)
Spoke with the patient, detailed instructions given. She stated that she would be here for her test. Asked to call back with any questions. S.Irlene Crudup EMTP 

## 2021-05-29 ENCOUNTER — Other Ambulatory Visit: Payer: Self-pay

## 2021-05-29 ENCOUNTER — Ambulatory Visit (HOSPITAL_COMMUNITY): Payer: PPO | Attending: Cardiology

## 2021-05-29 DIAGNOSIS — R079 Chest pain, unspecified: Secondary | ICD-10-CM | POA: Insufficient documentation

## 2021-05-29 LAB — MYOCARDIAL PERFUSION IMAGING
Angina Index: 0
Duke Treadmill Score: 5
Estimated workload: 7
Exercise duration (min): 5 min
Exercise duration (sec): 1 s
LV dias vol: 47 mL (ref 46–106)
LV sys vol: 18 mL
MPHR: 149 {beats}/min
Nuc Stress EF: 62 %
Peak HR: 150 {beats}/min
Percent HR: 100 %
RPE: 18
Rest HR: 86 {beats}/min
Rest Nuclear Isotope Dose: 10.6 mCi
SDS: 0
SRS: 2
SSS: 2
ST Depression (mm): 0 mm
Stress Nuclear Isotope Dose: 31.5 mCi
TID: 0.95

## 2021-05-29 MED ORDER — TECHNETIUM TC 99M TETROFOSMIN IV KIT
31.5000 | PACK | Freq: Once | INTRAVENOUS | Status: AC | PRN
  Administered 2021-05-29: 31.5 via INTRAVENOUS
  Filled 2021-05-29: qty 32

## 2021-05-29 MED ORDER — TECHNETIUM TC 99M TETROFOSMIN IV KIT
10.6000 | PACK | Freq: Once | INTRAVENOUS | Status: AC | PRN
  Administered 2021-05-29: 10.6 via INTRAVENOUS
  Filled 2021-05-29: qty 11

## 2021-06-04 ENCOUNTER — Encounter: Payer: Self-pay | Admitting: Pulmonary Disease

## 2021-06-04 ENCOUNTER — Other Ambulatory Visit: Payer: Self-pay

## 2021-06-04 ENCOUNTER — Ambulatory Visit: Payer: PPO | Admitting: Pulmonary Disease

## 2021-06-04 VITALS — BP 122/78 | HR 91 | Ht 63.5 in | Wt 147.0 lb

## 2021-06-04 DIAGNOSIS — J849 Interstitial pulmonary disease, unspecified: Secondary | ICD-10-CM | POA: Diagnosis not present

## 2021-06-04 DIAGNOSIS — J3089 Other allergic rhinitis: Secondary | ICD-10-CM | POA: Diagnosis not present

## 2021-06-04 DIAGNOSIS — J301 Allergic rhinitis due to pollen: Secondary | ICD-10-CM | POA: Diagnosis not present

## 2021-06-04 NOTE — Patient Instructions (Addendum)
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  We will follow up rheumatology's input on the inflammatory markers.  We will schedule you for a follow up appointment with our pharmacist March 13-17 for discussion about pirfenidone and nintedanib the anti-fibrotic medications.   Follow up in 3 months.

## 2021-06-04 NOTE — Progress Notes (Signed)
Synopsis: Referred in January 2022 by Pricilla Holm, MD for Chronic Cough  Subjective:   PATIENT ID: Sheri Andrews GENDER: female DOB: Nov 30, 1950, MRN: [id number]  HPI  Chief Complaint  Patient presents with   Follow-up    F/U to discuss ILD labs from last month. States she has been doing well since last visit.    Sheri Andrews is a 71 year old woman, never smoker with history of allergic rhinitis, asthma and GERD who returns to pulmonary clinic for asthma and concern of pulmonary fibrosis.   HRCT Chest was ordered after last visit due to abnormal flow volume loop concerning for either fixed airway obstruction versus variable extrathoracic airway obstruction. CT scan showed some patchy areas of ground glass attenuation, septal thickening and parenchymal banding in the lungs bilaterally, most evident in the mid to lower lungs. Very mild cylindrical traction bronchiectasis and peripheral bronchiolectasis. No frank honeycombing. Inspiratory and expiratory imaging indicates mild air trapping. Final read was concerning for probable usual interstitial pneumonia.   These results were discussed with the patient at her visit with Rexene Edison, NP on 05/12/21 and an inflammatory panel was checked with a positive ANA titer of 1:40 with elevated SSA ab at 6.4. She was referred to rheumatology and has an appointment on 06/27/21 with Dr. Benjamine Mola.   She denies any diffuse joint aches or skin changes. She does have some dry eyes. No family history of pulmonary fibrosis or autoimmune conditions.  OV 04/08/21 She was trialed on Breztri inhaler after last visit and she does report some improvement in her breathing.  She was seen in video visit on 04/02/2021 for increasing chest tightness, nonproductive cough and some wheezing.  She was provided with steroid taper and noted improvement in her cough and wheezing but she continues to experience chest tightness.  Pulmonary function test today shows moderate  obstructive defect with flow volume loop concerning for possible fixed airway obstruction versus possible variable extrathoracic airway obstruction.  OV 02/24/21 She has been using symbicort 160-4.1mcg 2 puffs twice daily but has noticed increase shortness of breath since having covid 19 in June. She was treated outpatient with an antiviral medication. She denies night time awakenings. She is mainly experiencing exertional dyspnea and does notice wheezing when she is walking for exercise.   She continues on medications for her GERD and sinus congestion/drainage with out issues.  OV 05/29/20 She presented to the [facility] ER on 04/16/20 with sinus pressure, headaches, cough and sore throat. She tested negative for Covid 19. Chest radiograph was unremarkable as well as BMP and CBC. She was provided with azelastine nasal spray and discharged home. She complains of cough since her ER visit that is dry. She has congestion with post-nasal drip. She has some sinus congestion and ear discomfort. She has shortness of breath with exertion and with coughing episodes. She has history of hiatal hernia and is on pantoprazole and famotidine for GERD. She reports a substernal chest discomfort that is not worse with exertion or associated with nausea or diaphoresis.   She is taking symbicort 160-4.66mcg 2 puffs twice daily and does not complain of frequent wheezing. She is on montelukast for asthma and allergies. She is on flonase daily.   She has seen Dr. Wilburn Cornelia in ENT perviously for pansinusitis, last seen in 06/2018.          Past Medical History:  Diagnosis Date   Acid reflux disease    Allergic rhinitis    Anxiety  Asthma    Barrett esophagus    Dyspnea    Dysthymia    Hypercholesterolemia    Hypertension    Irritable bowel syndrome    Nephrolithiasis    Osteoporosis    Other chest pain    Status post dilation of esophageal narrowing      Family History  Problem Relation Age of Onset   Heart disease  Father    Heart disease Mother    Diabetes Mother    Diabetes Sister    Diabetes Brother    Cancer Maternal Uncle        type unknown   Throat cancer Paternal Uncle    Heart attack Sister    Colon cancer Neg Hx    Stomach cancer Neg Hx    Rectal cancer Neg Hx    Pancreatic cancer Neg Hx      Social History   Socioeconomic History   Marital status: Married    Spouse name: Not on file   Number of children: 2   Years of education: Not on file   Highest education level: Not on file  Occupational History   Occupation: retired  Tobacco Use   Smoking status: Never   Smokeless tobacco: Never  Vaping Use   Vaping Use: Never used  Substance and Sexual Activity   Alcohol use: Yes    Alcohol/week: 3.0 standard drinks    Types: 3 Standard drinks or equivalent per week    Comment: social   Drug use: No   Sexual activity: Yes    Partners: Male  Other Topics Concern   Not on file  Social History Narrative   Not on file   Social Determinants of Health   Financial Resource Strain: Not on file  Food Insecurity: Not on file  Transportation Needs: Not on file  Physical Activity: Not on file  Stress: Not on file  Social Connections: Not on file  Intimate Partner Violence: Not on file     Allergies  Allergen Reactions   Bupropion Hcl Swelling    WELLBUTRIN   Doxycycline Hyclate Other (See Comments)    upset  stomach   Erythromycin Ethylsuccinate Other (See Comments)     upset stomach     Outpatient Medications Prior to Visit  Medication Sig Dispense Refill   albuterol (VENTOLIN HFA) 108 (90 Base) MCG/ACT inhaler 1-2 puff as needed 18 g 3   amLODipine (NORVASC) 10 MG tablet TAKE 1 TABLET(10 MG) BY MOUTH DAILY 90 tablet 2   Artificial Tear Solution (TEARS NATURALE OP) Place 1 drop into both eyes See admin instructions. Use 1 drop into both eyes (scheduled) in the morning & may use 3 times daily if needed for dry /irritated eyes.     atorvastatin (LIPITOR) 40 MG tablet Take  1 tablet (40 mg total) by mouth daily. Please keep upcoming appt with Dr. [name] in April 2023 before anymore refills. Thank you Final attempt 90 tablet 0   azelastine (OPTIVAR) 0.05 % ophthalmic solution Place 1 drop into both eyes daily as needed for allergies.     Azelastine HCl 0.15 % SOLN USE 2 SPRAYS IN EACH NOSTRIL IN THE MORNING AND AT BEDTIME 30 mL 0   benzonatate (TESSALON PERLES) 100 MG capsule Take 1 capsule (100 mg total) by mouth 3 (three) times daily as needed. 30 capsule 0   budesonide-formoterol (SYMBICORT) 160-4.5 MCG/ACT inhaler Inhale 2 puffs into the lungs 2 (two) times daily. 1 each 6   cholecalciferol (VITAMIN D) 25  MCG (1000 UT) tablet Take 1,000 Units by mouth at bedtime.     clonazePAM (KLONOPIN) 0.25 MG disintegrating tablet DISSOLVE ONE TABLET BY MOUTH EVERY DAY AS NEEDED 30 tablet 0   Cyanocobalamin (VITAMIN B 12 PO) Take by mouth daily.     EPIPEN 2-PAK 0.3 MG/0.3ML SOAJ injection Inject 0.3 mg into the muscle as needed for anaphylaxis.   1   escitalopram (LEXAPRO) 10 MG tablet Take 1 tablet (10 mg total) by mouth daily. 90 tablet 3   estrogen, conjugated,-medroxyprogesterone (PREMPRO) 0.45-1.5 MG per tablet Take 1 tablet by mouth at bedtime.      fluticasone (FLONASE) 50 MCG/ACT nasal spray Place 2 sprays into both nostrils daily.     ipratropium (ATROVENT) 0.03 % nasal spray Place 2 sprays into both nostrils every 12 (twelve) hours. 30 mL 12   montelukast (SINGULAIR) 10 MG tablet Take 1 tablet (10 mg total) by mouth at bedtime. 90 tablet 3   Olopatadine HCl 0.7 % SOLN Place 1 drop into both eyes daily as needed (allergies.).      pantoprazole (PROTONIX) 40 MG tablet Take 1 tablet (40 mg total) by mouth daily. 180 tablet 3   polyethylene glycol (MIRALAX / GLYCOLAX) 17 g packet Take 17 g by mouth daily as needed for mild constipation.      Probiotic Product (PROBIOTIC COLON SUPPORT PO) Take 1 capsule by mouth at bedtime.      Spacer/Aero-Holding Chambers (VALVED  HOLDING CHAMBER) DEVI SMARTSIG:Inhalation Via Inhaler As Directed     No facility-administered medications prior to visit.    Review of Systems  Constitutional:  Negative for chills, fever, malaise/fatigue and weight loss.  HENT:  Negative for congestion, sinus pain and sore throat.   Eyes: Negative.   Respiratory:  Positive for shortness of breath. Negative for cough, hemoptysis, sputum production and wheezing.   Cardiovascular:  Negative for chest pain, palpitations, orthopnea, claudication and leg swelling.       Chest tightness  Gastrointestinal:  Negative for abdominal pain, heartburn, nausea and vomiting.  Genitourinary: Negative.   Musculoskeletal:  Negative for joint pain and myalgias.  Skin:  Negative for rash.  Neurological:  Negative for weakness.  Endo/Heme/Allergies: Negative.   Psychiatric/Behavioral: Negative.     Objective:   Vitals:   06/04/21 1030  BP: 122/78  Pulse: 91  SpO2: 97%  Weight: 147 lb (66.7 kg)  Height: 5' 3.5" (1.613 m)   Physical Exam Constitutional:      General: She is not in acute distress.    Appearance: Normal appearance. She is not ill-appearing.  HENT:     Head: Normocephalic and atraumatic.  Eyes:     General: No scleral icterus.    Conjunctiva/sclera: Conjunctivae normal.  Cardiovascular:     Rate and Rhythm: Normal rate and regular rhythm.     Pulses: Normal pulses.     Heart sounds: Normal heart sounds. No murmur heard. Pulmonary:     Effort: Pulmonary effort is normal.     Breath sounds: Rales (basilar) present. No wheezing or rhonchi.  Musculoskeletal:     Right lower leg: No edema.     Left lower leg: No edema.  Skin:    General: Skin is warm and dry.  Neurological:     General: No focal deficit present.     Mental Status: She is alert.   CBC    Component Value Date/Time   WBC 4.7 01/21/2021 1141   RBC 4.30 01/21/2021 1141   HGB 13.8  01/21/2021 1141   HCT 40.2 01/21/2021 1141   PLT 239.0 01/21/2021 1141   MCV  93.3 01/21/2021 1141   MCH 31.8 04/16/2020 0801   MCHC 34.3 01/21/2021 1141   RDW 13.5 01/21/2021 1141   LYMPHSABS 1.5 04/16/2020 0801   MONOABS 0.7 04/16/2020 0801   EOSABS 0.0 04/16/2020 0801   BASOSABS 0.0 04/16/2020 0801   BMP Latest Ref Rng & Units 01/21/2021 09/04/2020 04/16/2020  Glucose 70 - 99 mg/dL 106(H) 106(H) 106(H)  BUN 6 - 23 mg/dL 16 18 14   Creatinine 0.40 - 1.20 mg/dL 0.93 0.93 0.84  BUN/Creat Ratio 6 - 22 (calc) - - -  Sodium 135 - 145 mEq/L 139 139 139  Potassium 3.5 - 5.1 mEq/L 3.9 3.6 3.7  Chloride 96 - 112 mEq/L 104 105 103  CO2 19 - 32 mEq/L 28 26 25   Calcium 8.4 - 10.5 mg/dL 9.3 9.4 9.2   Chest imaging: HRCT Chest 04/29/21 1. 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 These findings are clearly progressive compared to prior cardiac CT 01/25/2020. Repeat high-resolution chest CT is suggested in 12 months to assess for temporal changes in the appearance of the lung parenchyma. 2. Aortic atherosclerosis, in addition to left main and 2 vessel coronary artery disease.   CXR 02/24/21 Increased interstitial markings bilaterally  CXR 04/16/20 The heart size and mediastinal contours are within normal limits. Both lungs are clear. No pleural effusion. The visualized skeletal structures are unremarkable.  PFT: PFT Results Latest Ref Rng & Units 04/08/2021  FVC-Pre L 2.74  FVC-Predicted Pre % 96  FVC-Post L 2.77  FVC-Predicted Post % 97  Pre FEV1/FVC % % 66  Post FEV1/FCV % % 50  FEV1-Pre L 1.82  FEV1-Predicted Pre % 85  FEV1-Post L 1.37  DLCO uncorrected ml/min/mmHg 22.11  DLCO UNC% % 117  DLCO corrected ml/min/mmHg 22.11  DLCO COR %Predicted % 117  DLVA Predicted % 106  TLC L 5.14  TLC % Predicted % 104  RV % Predicted % 92    Labs:  Path:  Echo:  Heart Catheterization:  Assessment & Plan:   ILD  (interstitial lung disease) (HCC)  Discussion: Sheri Andrews is a 71 year old woman, never smoker with history of allergic rhinitis, asthma and GERD who returns to pulmonary clinic for asthma and concern of pulmonary fibrosis.   Her recent HRCT Chest scan shows probable UIP pattern with patchy areas of ground glass attenuation, septal thickening and parenchymal banding in the lungs bilaterally, most evident in the mid to lower lungs along with mild traction bronchiectasis and bronchilectasis.   We discussed these findings in detail and I reviewed the CT scans with the patient and her husband. We discussed that her PFTs did not indicate reduction in her TLC and DLCO which can change in pulmonary fibrosis patients.   We discussed antifibrotic therapies for this condition including pirfenidone and nintedanib. I also let them know about the possibility of entering a clinical trial that we offer through our research center.  She has appointment with rheumatology for further evaluation given her low positive ANA and positive SSA antibodies.   She will be scheduled for an appointment with our pharmacy team the week of March 13-17 after her visit with rheumatology for more information on the antifibrotics. I have offered for her to meet with Dr. Vaughan Browner or Dr. Chase Caller for a second opinion  if she would like.   She is to continue Breztri inhaler 2 puffs twice daily.  Follow up in 3 months.  Freda Jackson, MD [facility] Office: [phone]      Current Outpatient Medications:    albuterol (VENTOLIN HFA) 108 (90 Base) MCG/ACT inhaler, 1-2 puff as needed, Disp: 18 g, Rfl: 3   amLODipine (NORVASC) 10 MG tablet, TAKE 1 TABLET(10 MG) BY MOUTH DAILY, Disp: 90 tablet, Rfl: 2   Artificial Tear Solution (TEARS NATURALE OP), Place 1 drop into both eyes See admin instructions. Use 1 drop into both eyes (scheduled) in the morning & may use 3 times daily if needed for dry /irritated  eyes., Disp: , Rfl:    atorvastatin (LIPITOR) 40 MG tablet, Take 1 tablet (40 mg total) by mouth daily. Please keep upcoming appt with Dr. [name] in April 2023 before anymore refills. Thank you Final attempt, Disp: 90 tablet, Rfl: 0   azelastine (OPTIVAR) 0.05 % ophthalmic solution, Place 1 drop into both eyes daily as needed for allergies., Disp: , Rfl:    Azelastine HCl 0.15 % SOLN, USE 2 SPRAYS IN EACH NOSTRIL IN THE MORNING AND AT BEDTIME, Disp: 30 mL, Rfl: 0   benzonatate (TESSALON PERLES) 100 MG capsule, Take 1 capsule (100 mg total) by mouth 3 (three) times daily as needed., Disp: 30 capsule, Rfl: 0   budesonide-formoterol (SYMBICORT) 160-4.5 MCG/ACT inhaler, Inhale 2 puffs into the lungs 2 (two) times daily., Disp: 1 each, Rfl: 6   cholecalciferol (VITAMIN D) 25 MCG (1000 UT) tablet, Take 1,000 Units by mouth at bedtime., Disp: , Rfl:    clonazePAM (KLONOPIN) 0.25 MG disintegrating tablet, DISSOLVE ONE TABLET BY MOUTH EVERY DAY AS NEEDED, Disp: 30 tablet, Rfl: 0   Cyanocobalamin (VITAMIN B 12 PO), Take by mouth daily., Disp: , Rfl:    EPIPEN 2-PAK 0.3 MG/0.3ML SOAJ injection, Inject 0.3 mg into the muscle as needed for anaphylaxis. , Disp: , Rfl: 1   escitalopram (LEXAPRO) 10 MG tablet, Take 1 tablet (10 mg total) by mouth daily., Disp: 90 tablet, Rfl: 3   estrogen, conjugated,-medroxyprogesterone (PREMPRO) 0.45-1.5 MG per tablet, Take 1 tablet by mouth at bedtime. , Disp: , Rfl:    fluticasone (FLONASE) 50 MCG/ACT nasal spray, Place 2 sprays into both nostrils daily., Disp: , Rfl:    ipratropium (ATROVENT) 0.03 % nasal spray, Place 2 sprays into both nostrils every 12 (twelve) hours., Disp: 30 mL, Rfl: 12   montelukast (SINGULAIR) 10 MG tablet, Take 1 tablet (10 mg total) by mouth at bedtime., Disp: 90 tablet, Rfl: 3   Olopatadine HCl 0.7 % SOLN, Place 1 drop into both eyes daily as needed (allergies.). , Disp: , Rfl:    pantoprazole (PROTONIX) 40 MG tablet, Take 1 tablet (40 mg total) by  mouth daily., Disp: 180 tablet, Rfl: 3   polyethylene glycol (MIRALAX / GLYCOLAX) 17 g packet, Take 17 g by mouth daily as needed for mild constipation. , Disp: , Rfl:    Probiotic Product (PROBIOTIC COLON SUPPORT PO), Take 1 capsule by mouth at bedtime. , Disp: , Rfl:    Spacer/Aero-Holding Chambers (VALVED HOLDING CHAMBER) DEVI, SMARTSIG:Inhalation Via Inhaler As Directed, Disp: , Rfl:

## 2021-06-05 ENCOUNTER — Telehealth: Payer: Self-pay | Admitting: Gastroenterology

## 2021-06-05 NOTE — Telephone Encounter (Signed)
Inbound call from patient stated that she his having dark stools and bad gas and is seeking advice if she needs to come in for an appointment. Please advise.

## 2021-06-06 DIAGNOSIS — J301 Allergic rhinitis due to pollen: Secondary | ICD-10-CM | POA: Diagnosis not present

## 2021-06-06 DIAGNOSIS — J3089 Other allergic rhinitis: Secondary | ICD-10-CM | POA: Diagnosis not present

## 2021-06-06 DIAGNOSIS — J3081 Allergic rhinitis due to animal (cat) (dog) hair and dander: Secondary | ICD-10-CM | POA: Diagnosis not present

## 2021-06-06 NOTE — Telephone Encounter (Signed)
Patient reports she has had dark stools and increased flatus "for months, and I decided I needed to look into it." The caliper of the stool is diminished. She states, "I describe my dark stool as tarry." Offered appointment with APP. Declines. States, "I want to see her (Dr Silverio Decamp). First opening on 07/08/21 at 10:10 am. Patient accepts this appointment.  Patient is on Miralax and uses IBguard. No abdominal pain. No frank blood. No new medications. Colonoscopy was 03/08/2019. Due next colonoscopy 2030. Hx of GI bleed. EGD 05/02/2019. She did not follow up after that as was recommended. Do you want any labs on her?

## 2021-06-09 ENCOUNTER — Encounter (INDEPENDENT_AMBULATORY_CARE_PROVIDER_SITE_OTHER): Payer: Self-pay | Admitting: Ophthalmology

## 2021-06-09 ENCOUNTER — Ambulatory Visit (INDEPENDENT_AMBULATORY_CARE_PROVIDER_SITE_OTHER): Payer: PPO | Admitting: Ophthalmology

## 2021-06-09 ENCOUNTER — Other Ambulatory Visit: Payer: Self-pay

## 2021-06-09 DIAGNOSIS — H2511 Age-related nuclear cataract, right eye: Secondary | ICD-10-CM

## 2021-06-09 DIAGNOSIS — J301 Allergic rhinitis due to pollen: Secondary | ICD-10-CM | POA: Diagnosis not present

## 2021-06-09 DIAGNOSIS — H2512 Age-related nuclear cataract, left eye: Secondary | ICD-10-CM

## 2021-06-09 DIAGNOSIS — H35371 Puckering of macula, right eye: Secondary | ICD-10-CM

## 2021-06-09 DIAGNOSIS — H35372 Puckering of macula, left eye: Secondary | ICD-10-CM

## 2021-06-09 DIAGNOSIS — J3089 Other allergic rhinitis: Secondary | ICD-10-CM | POA: Diagnosis not present

## 2021-06-09 NOTE — Progress Notes (Signed)
06/09/2021     CHIEF COMPLAINT Patient presents for  Chief Complaint  Patient presents with   Retina Evaluation      HISTORY OF PRESENT ILLNESS: Sheri Andrews is a 71 y.o. female who presents to the clinic today for:   HPI   No history of profound changes in vision.  Patient has developed diagnosis of Sjogren's syndrome   Patient has also developed pulmonary fibrosis Has developed Sjogren's with some dry eye symptomatology Last edited by Hurman Horn, MD on 06/09/2021  2:02 PM.      Referring physician: Warden Fillers, MD [address] [place],  [place] [id number]  HISTORICAL INFORMATION:   Selected notes from the MEDICAL RECORD NUMBER    Lab Results  Component Value Date   HGBA1C 5.8 01/21/2021     CURRENT MEDICATIONS: Current Outpatient Medications (Ophthalmic Drugs)  Medication Sig   Artificial Tear Solution (TEARS NATURALE OP) Place 1 drop into both eyes See admin instructions. Use 1 drop into both eyes (scheduled) in the morning & may use 3 times daily if needed for dry /irritated eyes.   azelastine (OPTIVAR) 0.05 % ophthalmic solution Place 1 drop into both eyes daily as needed for allergies.   Olopatadine HCl 0.7 % SOLN Place 1 drop into both eyes daily as needed (allergies.).    No current facility-administered medications for this visit. (Ophthalmic Drugs)   Current Outpatient Medications (Other)  Medication Sig   albuterol (VENTOLIN HFA) 108 (90 Base) MCG/ACT inhaler 1-2 puff as needed   amLODipine (NORVASC) 10 MG tablet TAKE 1 TABLET(10 MG) BY MOUTH DAILY   atorvastatin (LIPITOR) 40 MG tablet Take 1 tablet (40 mg total) by mouth daily. Please keep upcoming appt with Dr. [name] in April 2023 before anymore refills. Thank you Final attempt   Azelastine HCl 0.15 % SOLN USE 2 SPRAYS IN EACH NOSTRIL IN THE MORNING AND AT BEDTIME   benzonatate (TESSALON PERLES) 100 MG capsule Take 1 capsule (100 mg total) by mouth 3 (three) times daily as  needed.   budesonide-formoterol (SYMBICORT) 160-4.5 MCG/ACT inhaler Inhale 2 puffs into the lungs 2 (two) times daily.   cholecalciferol (VITAMIN D) 25 MCG (1000 UT) tablet Take 1,000 Units by mouth at bedtime.   clonazePAM (KLONOPIN) 0.25 MG disintegrating tablet DISSOLVE ONE TABLET BY MOUTH EVERY DAY AS NEEDED   Cyanocobalamin (VITAMIN B 12 PO) Take by mouth daily.   EPIPEN 2-PAK 0.3 MG/0.3ML SOAJ injection Inject 0.3 mg into the muscle as needed for anaphylaxis.    escitalopram (LEXAPRO) 10 MG tablet Take 1 tablet (10 mg total) by mouth daily.   estrogen, conjugated,-medroxyprogesterone (PREMPRO) 0.45-1.5 MG per tablet Take 1 tablet by mouth at bedtime.    fluticasone (FLONASE) 50 MCG/ACT nasal spray Place 2 sprays into both nostrils daily.   ipratropium (ATROVENT) 0.03 % nasal spray Place 2 sprays into both nostrils every 12 (twelve) hours.   montelukast (SINGULAIR) 10 MG tablet Take 1 tablet (10 mg total) by mouth at bedtime.   pantoprazole (PROTONIX) 40 MG tablet Take 1 tablet (40 mg total) by mouth daily.   polyethylene glycol (MIRALAX / GLYCOLAX) 17 g packet Take 17 g by mouth daily as needed for mild constipation.    Probiotic Product (PROBIOTIC COLON SUPPORT PO) Take 1 capsule by mouth at bedtime.    Spacer/Aero-Holding Chambers (VALVED HOLDING CHAMBER) DEVI SMARTSIG:Inhalation Via Inhaler As Directed   No current facility-administered medications for this visit. (Other)  REVIEW OF SYSTEMS: ROS   Positive for: Musculoskeletal Last edited by Hurman Horn, MD on 06/09/2021  2:01 PM.       ALLERGIES Allergies  Allergen Reactions   Bupropion Hcl Swelling    WELLBUTRIN   Doxycycline Hyclate Other (See Comments)    upset  stomach   Erythromycin Ethylsuccinate Other (See Comments)     upset stomach    PAST MEDICAL HISTORY Past Medical History:  Diagnosis Date   Acid reflux disease    Allergic rhinitis    Anxiety    Asthma    Barrett esophagus    Dyspnea     Dysthymia    Hypercholesterolemia    Hypertension    Irritable bowel syndrome    Nephrolithiasis    Osteoporosis    Other chest pain    Status post dilation of esophageal narrowing    Past Surgical History:  Procedure Laterality Date   ESOPHAGOGASTRODUODENOSCOPY (EGD) WITH PROPOFOL N/A 05/02/2019   Procedure: ESOPHAGOGASTRODUODENOSCOPY (EGD) WITH PROPOFOL;  Surgeon: Mauri Pole, MD;  Location: [facility];  Service: Endoscopy;  Laterality: N/A;  needing APC available   FACIAL COSMETIC SURGERY     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 [place] Family History  Problem Relation Age of Onset   Heart disease Father    Heart disease Mother    Diabetes Mother    Diabetes Sister    Diabetes Brother    Cancer Maternal Uncle        type unknown   Throat cancer Paternal Uncle    Heart attack Sister    Colon cancer Neg Hx    Stomach cancer Neg Hx    Rectal cancer Neg Hx    Pancreatic cancer Neg Hx     SOCIAL HISTORY Social History   Tobacco Use   Smoking status: Never   Smokeless tobacco: Never  Vaping Use   Vaping Use: Never used  Substance Use Topics   Alcohol use: Yes    Alcohol/week: 3.0 standard drinks    Types: 3 Standard drinks or equivalent per week    Comment: social   Drug use: No         OPHTHALMIC EXAM:  Base Eye Exam     Visual Acuity (ETDRS)       Right Left   Dist cc 20/30 +1 20/30   Dist ph cc NI NI         Tonometry (Tonopen, 2:04 PM)       Right Left   Pressure 12 11         Pupils       Pupils APD   Right PERRL None   Left PERRL None         Visual Fields       Left Right    Full Full         Extraocular Movement       Right Left    Full, Ortho Full, Ortho         Neuro/Psych     Oriented x3: Yes   Mood/Affect: Normal         Dilation     Both eyes: 1.0% Mydriacyl, 2.5% Phenylephrine @ 2:02 PM           Slit Lamp and Fundus Exam      External Exam       Right Left   External Normal Normal  Slit Lamp Exam       Right Left   Lids/Lashes Normal Normal   Conjunctiva/Sclera White and quiet White and quiet   Cornea Clear Clear   Anterior Chamber Deep and quiet Deep and quiet   Iris Round and reactive Round and reactive   Lens 3+ Nuclear sclerosis    Anterior Vitreous Normal Normal         Fundus Exam       Right Left   Posterior Vitreous Normal Normal   Disc Normal Normal   C/D Ratio 0.3 0.3   Macula Normal Epiretinal membrane, foveal,   Vessels Normal Normal   Periphery Normal Normal            IMAGING AND PROCEDURES  Imaging and Procedures for 06/09/21  OCT, Retina - OU - Both Eyes       Right Eye Quality was good. Scan locations included subfoveal. Central Foveal Thickness: 267. Progression has been stable. Findings include epiretinal membrane.   Left Eye Quality was good. Scan locations included subfoveal. Central Foveal Thickness: 300. Progression has been stable. Findings include epiretinal membrane.   Notes None foveal distorting epiretinal membrane superiorly OD, observe  With epiretinal membrane overlying the fovea with pseudo-CME Perifoveal, schisis yet with good acuity okay to observe             ASSESSMENT/PLAN:  Right epiretinal membrane OD minor, not involving the fovea, superior to FAZ, no impact on acuity okay for cataract extraction of the right eye  Left epiretinal membrane OS minor foveal, with minor inner foveal schisis but no impact on acuity observe.  Okay to proceed with cath extraction in the left eye  Nuclear sclerotic cataract of left eye Progressive lens opacity left eye,, accounts for visual acuity changes I do recommend reevaluation with Warden Fillers consideration of cataract extraction with intraocular lens placement during these healthy years of this patient's life facing new medical issues  Nuclear sclerotic cataract of right  eye Progressive lens opacity left eye,, accounts for visual acuity changes I do recommend reevaluation with Warden Fillers consideration of cataract extraction with intraocular lens placement during these healthy years of this patient's life facing new medical issues     ICD-10-CM   1. Left epiretinal membrane  H35.372 OCT, Retina - OU - Both Eyes    2. Right epiretinal membrane  H35.371 OCT, Retina - OU - Both Eyes    3. Nuclear sclerotic cataract of left eye  H25.12     4. Nuclear sclerotic cataract of right eye  H25.11       1.  OU with visually significant cataracts and progressions companied by the patient's report of night vision troubles and darkening in dark rainy days hence the patient has a visually significant pain with activities of daily (nightly) of living  2.  You with minor epiretinal membrane in each eye not visually impactful at this time we will continue to monitor and observe  3.  Ophthalmic Meds Ordered this visit:  No orders of the defined types were placed in this encounter.      Return in about 1 year (around 06/09/2022) for DILATE OU, OCT.  There are no Patient Instructions on file for this visit.   Explained the diagnoses, plan, and follow up with the patient and they expressed understanding.  Patient expressed understanding of the importance of proper follow up care.   Clent Demark Ange Puskas M.D. Diseases & Surgery of the Retina [facility] 06/09/21     Abbreviations: M myopia (nearsighted); A astigmatism; H hyperopia (farsighted); P presbyopia; Mrx spectacle prescription;  CTL contact lenses; OD right eye; OS left eye; OU both eyes  XT exotropia; ET esotropia; PEK punctate epithelial keratitis; PEE punctate epithelial erosions; DES dry eye syndrome; MGD meibomian gland dysfunction; ATs artificial tears; PFAT's preservative free artificial tears; [place] nuclear sclerotic cataract; PSC posterior subcapsular cataract; ERM  epi-retinal membrane; PVD posterior vitreous detachment; RD retinal detachment; DM diabetes mellitus; DR diabetic retinopathy; NPDR non-proliferative diabetic retinopathy; PDR proliferative diabetic retinopathy; CSME clinically significant macular edema; DME diabetic macular edema; dbh dot blot hemorrhages; CWS cotton wool spot; POAG primary open angle glaucoma; C/D cup-to-disc ratio; HVF humphrey visual field; GVF goldmann visual field; OCT optical coherence tomography; IOP intraocular pressure; BRVO Branch retinal vein occlusion; CRVO central retinal vein occlusion; CRAO central retinal artery occlusion; BRAO branch retinal artery occlusion; RT retinal tear; SB scleral buckle; PPV pars plana vitrectomy; VH Vitreous hemorrhage; PRP panretinal laser photocoagulation; IVK intravitreal kenalog; VMT vitreomacular traction; MH Macular hole;  NVD neovascularization of the disc; NVE neovascularization elsewhere; AREDS age related eye disease study; ARMD age related macular degeneration; POAG primary open angle glaucoma; EBMD epithelial/anterior basement membrane dystrophy; ACIOL anterior chamber intraocular lens; IOL intraocular lens; PCIOL posterior chamber intraocular lens; Phaco/IOL phacoemulsification with intraocular lens placement; [place] photorefractive keratectomy; LASIK laser assisted in situ keratomileusis; HTN hypertension; DM diabetes mellitus; COPD chronic obstructive pulmonary disease

## 2021-06-09 NOTE — Assessment & Plan Note (Signed)
OS minor foveal, with minor inner foveal schisis but no impact on acuity observe.  Okay to proceed with cath extraction in the left eye

## 2021-06-09 NOTE — Assessment & Plan Note (Signed)
OD minor, not involving the fovea, superior to FAZ, no impact on acuity okay for cataract extraction of the right eye

## 2021-06-09 NOTE — Assessment & Plan Note (Signed)
Progressive lens opacity left eye,, accounts for visual acuity changes I do recommend reevaluation with Sheri Andrews consideration of cataract extraction with intraocular lens placement during these healthy years of this patient's life facing new medical issues

## 2021-06-09 NOTE — Assessment & Plan Note (Signed)
Progressive lens opacity left eye,, accounts for visual acuity changes I do recommend reevaluation with Warden Fillers consideration of cataract extraction with intraocular lens placement during these healthy years of this patient's life facing new medical issues

## 2021-06-10 ENCOUNTER — Other Ambulatory Visit: Payer: Self-pay

## 2021-06-10 DIAGNOSIS — K31819 Angiodysplasia of stomach and duodenum without bleeding: Secondary | ICD-10-CM

## 2021-06-10 DIAGNOSIS — D500 Iron deficiency anemia secondary to blood loss (chronic): Secondary | ICD-10-CM

## 2021-06-10 NOTE — Telephone Encounter (Signed)
Please advise patient to come in for CBC, iron panel and BMP.  We can try to bring her in sooner if I have any cancellations.  Thank you

## 2021-06-10 NOTE — Telephone Encounter (Signed)
Called the patient. No answer. Left her a message to call me back.

## 2021-06-10 NOTE — Telephone Encounter (Signed)
Patient agrees to this plan. Appointment moved to 06/20/21.

## 2021-06-11 ENCOUNTER — Ambulatory Visit: Payer: PPO | Admitting: Pulmonary Disease

## 2021-06-11 DIAGNOSIS — M79672 Pain in left foot: Secondary | ICD-10-CM | POA: Diagnosis not present

## 2021-06-13 DIAGNOSIS — J3089 Other allergic rhinitis: Secondary | ICD-10-CM | POA: Diagnosis not present

## 2021-06-13 DIAGNOSIS — J301 Allergic rhinitis due to pollen: Secondary | ICD-10-CM | POA: Diagnosis not present

## 2021-06-16 ENCOUNTER — Telehealth: Payer: Self-pay | Admitting: Pulmonary Disease

## 2021-06-16 NOTE — Telephone Encounter (Signed)
Pt sent MyChart message asking to be seen by either Dr. Vaughan Browner or Dr. Chase Caller for second opinion on pulmonary firbrosis. Still wants to keep JD as her doctor, but would like second opinion. The front has been instructed to treat this as a transfer of care and seek the approval of both doctors. Please advise.

## 2021-06-17 NOTE — Telephone Encounter (Signed)
Will await Dr. Matilde Bash response.

## 2021-06-17 NOTE — Telephone Encounter (Signed)
Spoke to patient and informed her office protocol.  She is aware that our office will contact her with a response.

## 2021-06-17 NOTE — Telephone Encounter (Signed)
Lm for patient to make her aware of office protocol.   Dr. Erin Fulling and Dr. Vaughan Browner, please advise. Thanks

## 2021-06-18 ENCOUNTER — Encounter: Payer: Self-pay | Admitting: Pulmonary Disease

## 2021-06-19 ENCOUNTER — Other Ambulatory Visit (INDEPENDENT_AMBULATORY_CARE_PROVIDER_SITE_OTHER): Payer: PPO

## 2021-06-19 DIAGNOSIS — K31819 Angiodysplasia of stomach and duodenum without bleeding: Secondary | ICD-10-CM

## 2021-06-19 DIAGNOSIS — D500 Iron deficiency anemia secondary to blood loss (chronic): Secondary | ICD-10-CM | POA: Diagnosis not present

## 2021-06-19 LAB — BASIC METABOLIC PANEL
BUN: 16 mg/dL (ref 6–23)
CO2: 28 mEq/L (ref 19–32)
Calcium: 9.4 mg/dL (ref 8.4–10.5)
Chloride: 104 mEq/L (ref 96–112)
Creatinine, Ser: 0.96 mg/dL (ref 0.40–1.20)
GFR: 59.64 mL/min — ABNORMAL LOW (ref >60.00)
Glucose, Bld: 83 mg/dL (ref 70–99)
Potassium: 3.7 mEq/L (ref 3.5–5.1)
Sodium: 140 mEq/L (ref 135–145)

## 2021-06-19 LAB — CBC WITH DIFFERENTIAL/PLATELET
Basophils Absolute: 0 10*3/uL (ref 0.0–0.1)
Basophils Relative: 0.4 % (ref 0.0–3.0)
Eosinophils Absolute: 0.1 10*3/uL (ref 0.0–0.7)
Eosinophils Relative: 1.4 % (ref 0.0–5.0)
HCT: 39.2 % (ref 36.0–46.0)
Hemoglobin: 13.4 g/dL (ref 12.0–15.0)
Lymphocytes Relative: 52.6 % — ABNORMAL HIGH (ref 12.0–46.0)
Lymphs Abs: 2.6 10*3/uL (ref 0.7–4.0)
MCHC: 34.2 g/dL (ref 30.0–36.0)
MCV: 91.1 fl (ref 78.0–100.0)
Monocytes Absolute: 0.5 10*3/uL (ref 0.1–1.0)
Monocytes Relative: 10.5 % (ref 3.0–12.0)
Neutro Abs: 1.7 10*3/uL (ref 1.4–7.7)
Neutrophils Relative %: 35.1 % — ABNORMAL LOW (ref 43.0–77.0)
Platelets: 239 10*3/uL (ref 150.0–400.0)
RBC: 4.3 Mil/uL (ref 3.87–5.11)
RDW: 14.3 % (ref 11.5–15.5)
WBC: 4.9 10*3/uL (ref 4.0–10.5)

## 2021-06-20 ENCOUNTER — Ambulatory Visit: Payer: PPO | Admitting: Gastroenterology

## 2021-06-20 ENCOUNTER — Encounter: Payer: Self-pay | Admitting: Gastroenterology

## 2021-06-20 VITALS — BP 108/78 | HR 108 | Ht 63.0 in | Wt 146.0 lb

## 2021-06-20 DIAGNOSIS — J301 Allergic rhinitis due to pollen: Secondary | ICD-10-CM | POA: Diagnosis not present

## 2021-06-20 DIAGNOSIS — I998 Other disorder of circulatory system: Secondary | ICD-10-CM

## 2021-06-20 DIAGNOSIS — J84112 Idiopathic pulmonary fibrosis: Secondary | ICD-10-CM | POA: Diagnosis not present

## 2021-06-20 DIAGNOSIS — K921 Melena: Secondary | ICD-10-CM | POA: Diagnosis not present

## 2021-06-20 DIAGNOSIS — K219 Gastro-esophageal reflux disease without esophagitis: Secondary | ICD-10-CM

## 2021-06-20 DIAGNOSIS — J3089 Other allergic rhinitis: Secondary | ICD-10-CM | POA: Diagnosis not present

## 2021-06-20 LAB — IRON,TIBC AND FERRITIN PANEL
%SAT: 27 % (calc) (ref 16–45)
Ferritin: 44 ng/mL (ref 16–288)
Iron: 95 ug/dL (ref 45–160)
TIBC: 346 mcg/dL (calc) (ref 250–450)

## 2021-06-20 MED ORDER — LANSOPRAZOLE 30 MG PO CPDR: 30.0000 mg | DELAYED_RELEASE_CAPSULE | Freq: Two times a day (BID) | ORAL | 3 refills | Status: DC

## 2021-06-20 NOTE — Telephone Encounter (Signed)
Ok to schedule next available for 30 min slot ?

## 2021-06-20 NOTE — Patient Instructions (Addendum)
You have been scheduled for an Endoscopy at [facility] on April 24 at 10:45 am. Separate instructions for the hospital has been given  STOP Pantoprazole  We have sent the following medications to your pharmacy for you to pick up at your convenience:  Lansoprazole 30 mg  Use Vitamin + Fe daily  Use Iron chelated capsules 18 mg every other day  Follow up in 3 months Gastroesophageal Reflux Disease, Adult Gastroesophageal reflux (GER) happens when acid from the stomach flows up into the tube that connects the mouth and the stomach (esophagus). Normally, food travels down the esophagus and stays in the stomach to be digested. However, when a person has GER, food and stomach acid sometimes move back up into the esophagus. If this becomes a more serious problem, the person may be diagnosed with a disease called gastroesophageal reflux disease (GERD). GERD occurs when the reflux: Happens often. Causes frequent or severe symptoms. Causes problems such as damage to the esophagus. When stomach acid comes in contact with the esophagus, the acid may cause inflammation in the esophagus. Over time, GERD may create small holes (ulcers) in the lining of the esophagus. What are the causes? This condition is caused by a problem with the muscle between the esophagus and the stomach (lower esophageal sphincter, or LES). Normally, the LES muscle closes after food passes through the esophagus to the stomach. When the LES is weakened or abnormal, it does not close properly, and that allows food and stomach acid to go back up into the esophagus. The LES can be weakened by certain dietary substances, medicines, and medical conditions, including: Tobacco use. Pregnancy. Having a hiatal hernia. Alcohol use. Certain foods and beverages, such as coffee, chocolate, onions, and peppermint. What increases the risk? You are more likely to develop this condition if you: Have an increased body weight. Have a connective tissue  disorder. Take NSAIDs, such as ibuprofen. What are the signs or symptoms? Symptoms of this condition include: Heartburn. Difficult or painful swallowing and the feeling of having a lump in the throat. A bitter taste in the mouth. Bad breath and having a large amount of saliva. Having an upset or bloated stomach and belching. Chest pain. Different conditions can cause chest pain. Make sure you see your health care provider if you experience chest pain. Shortness of breath or wheezing. Ongoing (chronic) cough or a nighttime cough. Wearing away of tooth enamel. Weight loss. How is this diagnosed? This condition may be diagnosed based on a medical history and a physical exam. To determine if you have mild or severe GERD, your health care provider may also monitor how you respond to treatment. You may also have tests, including: A test to examine your stomach and esophagus with a small camera (endoscopy). A test that measures the acidity level in your esophagus. A test that measures how much pressure is on your esophagus. A barium swallow or modified barium swallow test to show the shape, size, and functioning of your esophagus. How is this treated? Treatment for this condition may vary depending on how severe your symptoms are. Your health care provider may recommend: Changes to your diet. Medicine. Surgery. The goal of treatment is to help relieve your symptoms and to prevent complications. Follow these instructions at home: Eating and drinking  Follow a diet as recommended by your health care provider. This may involve avoiding foods and drinks such as: Coffee and tea, with or without caffeine. Drinks that contain alcohol. Energy drinks and sports drinks.  Carbonated drinks or sodas. Chocolate and cocoa. Peppermint and mint flavorings. Garlic and onions. Horseradish. Spicy and acidic foods, including peppers, chili powder, curry powder, vinegar, hot sauces, and barbecue  sauce. Citrus fruit juices and citrus fruits, such as oranges, lemons, and limes. Tomato-based foods, such as red sauce, chili, salsa, and pizza with red sauce. Fried and fatty foods, such as donuts, french fries, potato chips, and high-fat dressings. High-fat meats, such as hot dogs and fatty cuts of red and white meats, such as rib eye steak, sausage, ham, and bacon. High-fat dairy items, such as whole milk, butter, and cream cheese. Eat small, frequent meals instead of large meals. Avoid drinking large amounts of liquid with your meals. Avoid eating meals during the 2-3 hours before bedtime. Avoid lying down right after you eat. Do not exercise right after you eat. Lifestyle  Do not use any products that contain nicotine or tobacco. These products include cigarettes, chewing tobacco, and vaping devices, such as e-cigarettes. If you need help quitting, ask your health care provider. Try to reduce your stress by using methods such as yoga or meditation. If you need help reducing stress, ask your health care provider. If you are overweight, reduce your weight to an amount that is healthy for you. Ask your health care provider for guidance about a safe weight loss goal. General instructions Pay attention to any changes in your symptoms. Take over-the-counter and prescription medicines only as told by your health care provider. Do not take aspirin, ibuprofen, or other NSAIDs unless your health care provider told you to take these medicines. Wear loose-fitting clothing. Do not wear anything tight around your waist that causes pressure on your abdomen. Raise (elevate) the head of your bed about 6 inches (15 cm). You can use a wedge to do this. Avoid bending over if this makes your symptoms worse. Keep all follow-up visits. This is important. Contact a health care provider if: You have: New symptoms. Unexplained weight loss. Difficulty swallowing or it hurts to swallow. Wheezing or a  persistent cough. A hoarse voice. Your symptoms do not improve with treatment. Get help right away if: You have sudden pain in your arms, neck, jaw, teeth, or back. You suddenly feel sweaty, dizzy, or light-headed. You have chest pain or shortness of breath. You vomit and the vomit is green, yellow, or black, or it looks like blood or coffee grounds. You faint. You have stool that is red, bloody, or black. You cannot swallow, drink, or eat. These symptoms may represent a serious problem that is an emergency. Do not wait to see if the symptoms will go away. Get medical help right away. Call your local emergency services (911 in the [place] Do not drive yourself to the hospital. Summary Gastroesophageal reflux happens when acid from the stomach flows up into the esophagus. GERD is a disease in which the reflux happens often, causes frequent or severe symptoms, or causes problems such as damage to the esophagus. Treatment for this condition may vary depending on how severe your symptoms are. Your health care provider may recommend diet and lifestyle changes, medicine, or surgery. Contact a health care provider if you have new or worsening symptoms. Take over-the-counter and prescription medicines only as told by your health care provider. Do not take aspirin, ibuprofen, or other NSAIDs unless your health care provider told you to do so. Keep all follow-up visits as told by your health care provider. This is important. This information is not intended to replace  advice given to you by your health care provider. Make sure you discuss any questions you have with your health care provider. Document Revised: 10/16/2019 Document Reviewed: 10/16/2019 Elsevier Patient Education  [place].     If you are age 52 or older, your body mass index should be between 23-30. Your Body mass index is 25.86 kg/m. If this is out of the aforementioned range listed, please consider follow up with your  Primary Care Provider.  If you are age 22 or younger, your body mass index should be between 19-25. Your Body mass index is 25.86 kg/m. If this is out of the aformentioned range listed, please consider follow up with your Primary Care Provider.   ________________________________________________________  The  GI providers would like to encourage you to use [facility] to communicate with providers for non-urgent requests or questions.  Due to long hold times on the telephone, sending your provider a message by [facility] may be a faster and more efficient way to get a response.  Please allow 48 business hours for a response.  Please remember that this is for non-urgent requests.  _______________________________________________________   Due to recent changes in healthcare laws, you may see the results of your imaging and laboratory studies on MyChart before your provider has had a chance to review them.  We understand that in some cases there may be results that are confusing or concerning to you. Not all laboratory results come back in the same time frame and the provider may be waiting for multiple results in order to interpret others.  Please give [place] 48 hours in order for your provider to thoroughly review all the results before contacting the office for clarification of your results.    I appreciate the  opportunity to care for you  Thank You   Harl Bowie , MD

## 2021-06-20 NOTE — Progress Notes (Signed)
? ?       ? ?Sheri Andrews    [id number]    1950/10/13 ? ?Primary Care Physician:Crawford, Real Cons, MD ? ?Referring Physician: [name], MD ?[address] ?[place],  [place] [address] ? ? ?Chief complaint: Black stools ? ?HPI: ?71 year old very pleasant female here with complaints of intermittent black stool for past few months ? ?Has history of gastric angiectasia, is s/p eradication with argon plasma coagulation in January 2021 ? ?She was recently diagnosed with pulmonary fibrosis and Sjogren's disease, is undergoing work-up and has follow-up with rheumatology and pulmonary ? ?Denies any abdominal pain, vomiting, rectal bleeding or dysphagia. ? ?She has chronic GERD and is on Protonix, has breakthrough heartburn and chronic cough ? ?EGD May 02, 2019 ?- Normal esophagus. ?- Two non-bleeding angiodysplastic lesions in the stomach. Treated with argon plasma ?coagulation (APC). ?- Normal examined duodenum. ? ?EGD 03/08/2019 ?- Normal esophagus. ?- Gastric antral vascular ectasia without bleeding. ?- Normal examined duodenum. ? ?Colonoscopy March 08, 2019 ?- The perianal and digital rectal examinations were normal. ?- External and internal hemorrhoids were found during retroflexion. The hemorrhoids were ?medium-sized. ?- The exam was otherwise without abnormality. ? ? ?Outpatient Encounter Medications as of 06/20/2021  ?Medication Sig  ? albuterol (VENTOLIN HFA) 108 (90 Base) MCG/ACT inhaler 1-2 puff as needed  ? amLODipine (NORVASC) 10 MG tablet TAKE 1 TABLET(10 MG) BY MOUTH DAILY  ? Artificial Tear Solution (TEARS NATURALE OP) Place 1 drop into both eyes See admin instructions. Use 1 drop into both eyes (scheduled) in the morning & may use 3 times daily if needed for dry /irritated eyes.  ? atorvastatin (LIPITOR) 40 MG tablet Take 1 tablet (40 mg total) by mouth daily. Please keep upcoming appt with Dr. [name] in April 2023 before anymore refills. Thank you Final attempt  ? azelastine  (OPTIVAR) 0.05 % ophthalmic solution Place 1 drop into both eyes daily as needed for allergies.  ? Azelastine HCl 0.15 % SOLN USE 2 SPRAYS IN EACH NOSTRIL IN THE MORNING AND AT BEDTIME  ? benzonatate (TESSALON PERLES) 100 MG capsule Take 1 capsule (100 mg total) by mouth 3 (three) times daily as needed.  ? budesonide-formoterol (SYMBICORT) 160-4.5 MCG/ACT inhaler Inhale 2 puffs into the lungs 2 (two) times daily.  ? cholecalciferol (VITAMIN D) 25 MCG (1000 UT) tablet Take 1,000 Units by mouth at bedtime.  ? clonazePAM (KLONOPIN) 0.25 MG disintegrating tablet DISSOLVE ONE TABLET BY MOUTH EVERY DAY AS NEEDED  ? Cyanocobalamin (VITAMIN B 12 PO) Take by mouth daily.  ? EPIPEN 2-PAK 0.3 MG/0.3ML SOAJ injection Inject 0.3 mg into the muscle as needed for anaphylaxis.   ? escitalopram (LEXAPRO) 10 MG tablet Take 1 tablet (10 mg total) by mouth daily.  ? estrogen, conjugated,-medroxyprogesterone (PREMPRO) 0.45-1.5 MG per tablet Take 1 tablet by mouth at bedtime.   ? fluticasone (FLONASE) 50 MCG/ACT nasal spray Place 2 sprays into both nostrils daily.  ? ipratropium (ATROVENT) 0.03 % nasal spray Place 2 sprays into both nostrils every 12 (twelve) hours.  ? montelukast (SINGULAIR) 10 MG tablet Take 1 tablet (10 mg total) by mouth at bedtime.  ? Olopatadine HCl 0.7 % SOLN Place 1 drop into both eyes daily as needed (allergies.).   ? pantoprazole (PROTONIX) 40 MG tablet Take 1 tablet (40 mg total) by mouth daily.  ? polyethylene glycol (MIRALAX / GLYCOLAX) 17 g packet Take 17 g by mouth daily as needed for mild constipation.   ? Probiotic Product (  PROBIOTIC COLON SUPPORT PO) Take 1 capsule by mouth at bedtime.   ? Spacer/Aero-Holding Chambers (VALVED HOLDING CHAMBER) DEVI SMARTSIG:Inhalation Via Inhaler As Directed  ? ?No facility-administered encounter medications on file as of 06/20/2021.  ? ? ?Allergies as of 06/20/2021 - Review Complete 06/18/2021  ?Allergen Reaction Noted  ? Bupropion hcl Swelling   ? Doxycycline hyclate  Other (See Comments) 02/25/2005  ? Erythromycin ethylsuccinate Other (See Comments) 02/25/2005  ? ? ?Past Medical History:  ?Diagnosis Date  ? Acid reflux disease   ? Allergic rhinitis   ? Anxiety   ? Asthma   ? Barrett esophagus   ? Dyspnea   ? Dysthymia   ? Hypercholesterolemia   ? Hypertension   ? Irritable bowel syndrome   ? Nephrolithiasis   ? Osteoporosis   ? Other chest pain   ? Status post dilation of esophageal narrowing   ? ? ?Past Surgical History:  ?Procedure Laterality Date  ? ESOPHAGOGASTRODUODENOSCOPY (EGD) WITH PROPOFOL N/A 05/02/2019  ? Procedure: ESOPHAGOGASTRODUODENOSCOPY (EGD) WITH PROPOFOL;  Surgeon: Mauri Pole, MD;  Location: [facility];  Service: Endoscopy;  Laterality: N/A;  needing APC available  ? FACIAL COSMETIC SURGERY    ? 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  ? TUBAL LIGATION  1986  ? ? ?Family History  ?Problem Relation Age of Onset  ? Heart disease Father   ? Heart disease Mother   ? Diabetes Mother   ? Diabetes Sister   ? Diabetes Brother   ? Cancer Maternal Uncle   ?     type unknown  ? Throat cancer Paternal Uncle   ? Heart attack Sister   ? Colon cancer Neg Hx   ? Stomach cancer Neg Hx   ? Rectal cancer Neg Hx   ? Pancreatic cancer Neg Hx   ? ? ?Social History  ? ?Socioeconomic History  ? Marital status: Married  ?  Spouse name: Not on file  ? Number of children: 2  ? Years of education: Not on file  ? Highest education level: Not on file  ?Occupational History  ? Occupation: retired  ?Tobacco Use  ? Smoking status: Never  ? Smokeless tobacco: Never  ?Vaping Use  ? Vaping Use: Never used  ?Substance and Sexual Activity  ? Alcohol use: Yes  ?  Alcohol/week: 3.0 standard drinks  ?  Types: 3 Standard drinks or equivalent per week  ?  Comment: social  ? Drug use: No  ? Sexual activity: Yes  ?  Partners: Male  ?Other Topics Concern  ? Not on file  ?Social History Narrative  ? Not on file  ? ?Social Determinants of Health  ? ?Financial Resource Strain: Not on  file  ?Food Insecurity: Not on file  ?Transportation Needs: Not on file  ?Physical Activity: Not on file  ?Stress: Not on file  ?Social Connections: Not on file  ?Intimate Partner Violence: Not on file  ? ? ? ? ?Review of systems: ?All other review of systems negative except as mentioned in the HPI. ? ? ?Physical Exam: ?Vitals:  ? 06/20/21 1105  ?BP: 108/78  ?Pulse: (!) 108  ? ?Body mass index is 25.86 kg/m?. ?Gen:      No acute distress ?HEENT:  sclera anicteric ?Abd:      soft, non-tender; no palpable masses, no distension ?Ext:    No edema ?Neuro: alert and oriented x 3 ?Psych: normal mood and affect ? ?Data Reviewed: ? ?Reviewed labs, radiology imaging,  old records and pertinent past GI work up ? ? ?Assessment and Plan/Recommendations: ? ?71 year old very pleasant female with history of chronic GERD, recent diagnosis of pulmonary fibrosis, Sjogren's disease with complaints of intermittent melena ? ?She has history of gastric angiectasia's, we will schedule for EGD for evaluation and possible therapeutic intervention with APC ?The risks and benefits as well as alternatives of endoscopic procedure(s) have been discussed and reviewed. All questions answered. The patient agrees to proceed. ? ? ?Hemoglobin and hematocrit is within normal range. ?Iron panel is within normal range ? ?Advised patient to start taking multivitamin with iron daily  ?The side effects of iron are discussed, primarily GI in type, such as cramping, constipation, black stools. Start with low dose of iron chelated capsules every other day , and if tolerated, increase dose to 1 tab daily. ? ?GERD: Switch to lansoprazole 30 mg twice daily, 30 minutes before breakfast and dinner ?Stop pantoprazole ?Antireflux measures and lifestyle modifications ? ?Colorectal cancer screening: Average risk.  Due for recall colonoscopy in November 2030 ? ?Return in 3 months or sooner if needed ? ? ? The patient was provided an opportunity to ask questions and all  were answered. The patient agreed with the plan and demonstrated an understanding of the instructions. ? ?K. Denzil Magnuson , MD ?  ? ?CC: [name], * ? ? ?

## 2021-06-20 NOTE — Telephone Encounter (Signed)
Spoke with patient and she is scheduled 07/09/21 at 0930 with Dr. Vaughan Browner for second opinion for pulmonary fibrosis.  ?

## 2021-06-26 NOTE — Progress Notes (Signed)
Office Visit Note  Patient: Sheri Andrews             Date of Birth: 06-19-1950           MRN: [id number]             PCP: [name], MD Referring: Melvenia Needles, NP Visit Date: 06/27/2021 Occupation: Retired, Education officer, museum, [employer] work  Subjective:  New Patient (Initial Visit) (Abnormal labs)   History of Present Illness: Sheri Andrews is a 71 y.o. female here for evaluation of Sjogren's syndrome. She had recent positive ANA and SSA Abs checked associated with new diagnosis of ILD. She has a previous history of moderate persistent asthma, GAVE, and barrett's esophagus. She had December visit with Dr. Erin Fulling for moderate persistent asthma with obstructive PFT findings and HRCT demonstrated definite changes compared to 2021. So far PFTs have not demonstrated any significant restrictive changes. Besides pulmonary issues she has a longstanding history of dry eyes and mouth complaints. She attributed this to her chronic allergic rhinitis and conjunctivitis and frequent sinusitis infections. She has taken allergy shots and frequent antihistamine medication for these problems. She sees ophthalmology recent exam with cataracts and also has some epiretinal membrane changes in both eyes. She mostly treats dry eyes with artificial tears/systane/refresh. She estimates dry mouth symptoms are since about 20 years ago. Just drinks a lot of fluid to treat this. She has numerous crowns no particular mouth ulcers or lesions. She has had oral thrush but was attributed to steroid inhaler for asthma. She has dysphagia reports inability to eat without drinking fluids between solid foods. 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 Silverio Decamp for this problem. She takes PPI long term for heartburn and cough. She has mild severity of joint pain increased in her hands within the past few years. Does not have prolonged morning  stiffness. Sometimes swelling in bilateral hands not associated with numbness or decreased strength. She notices some redness and nodules in distal finger joints. Intermittently swelling in the feet and ankles nothing persistently. She has some progressive 1st MTP bunions and left lateral foot tendonitis. Right cyst in front of right ear this is very chronic she discussed surgical removal with dermatologist a year ago. She sometimes expresses drainage from this during increased swelling. Recently worse with tenderness, swelling, and erythema. She denies any lymph node swelling, fevers, skin rashes, or raynaud's symptoms. Most recent metabolic panel eGFR 59 no history of problems. Most recent CBC slightly elevated relative lymphocytes 52.6% normal total level.  Labs reviewed ANA 1:40 speckled SSA 6.4 dsDNA, SSB, Scl-70 neg RF neg CCP neg ANCA neg CBC 52.6% lymphs  Imaging reviewed 04/29/21 HRCT Chest IMPRESSION: 1. 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 These findings are clearly progressive compared to prior cardiac CT 01/25/2020. Repeat high-resolution chest CT is suggested in 12 months to assess for temporal changes in the appearance of the lung parenchyma. 2. Aortic atherosclerosis, in addition to left main and 2 vessel coronary artery disease. Please note that although the presence of coronary artery calcium documents the presence of coronary artery disease, the severity of this disease and any potential stenosis cannot be assessed on this non-gated CT examination. Assessment for potential risk factor modification, dietary therapy or pharmacologic therapy may be warranted, if clinically indicated.  Activities of Daily Living:  Patient reports morning stiffness for 0  none .   Patient Reports nocturnal pain.  Difficulty dressing/grooming:  Denies Difficulty climbing stairs: Denies Difficulty getting out of chair: Denies Difficulty using hands for taps, buttons, cutlery, and/or writing: Denies  Review of Systems  Constitutional:  Positive for fatigue.  HENT:  Positive for mouth dryness.   Eyes:  Positive for dryness.  Respiratory:  Positive for shortness of breath.   Cardiovascular:  Positive for swelling in legs/feet.  Gastrointestinal:  Positive for constipation and diarrhea.  Endocrine: Positive for excessive thirst.  Genitourinary:  Negative for difficulty urinating.  Musculoskeletal:  Positive for joint pain, joint pain, joint swelling and muscle tenderness.  Skin:  Negative for rash.  Allergic/Immunologic: Positive for susceptible to infections.  Neurological:  Negative for numbness.  Hematological:  Positive for bruising/bleeding tendency.  Psychiatric/Behavioral:  Positive for sleep disturbance.    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   High risk medication use 06/27/2021   ILD (interstitial lung disease) ([place]) 05/12/2021   Left foot pain 01/24/2021   Frontal headache 11/19/2020   Right epiretinal membrane 01/03/2020   Left epiretinal membrane 01/03/2020   Nuclear sclerotic cataract of right eye 01/03/2020   Nuclear sclerotic cataract of left eye 01/03/2020   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   Anatomical narrow angle of right eye 01/03/2020   GAVE (gastric antral vascular ectasia)    Iron deficiency anemia due to chronic blood loss    Chronic pansinusitis 06/22/2018   Nasal turbinate hypertrophy 06/22/2018    Asthma 05/25/2018   Essential hypertension 06/29/2017   Routine general medical examination at a health care facility 05/15/2016   Family history of early CAD 03/09/2014   Osteopenia 02/01/2012   BARRETTS ESOPHAGUS 10/08/2008   NEPHROLITHIASIS 04/30/2007   OCD (obsessive compulsive disorder) 04/11/2007   HYPERCHOLESTEROLEMIA 04/08/2007   IRRITABLE BOWEL SYNDROME 04/08/2007    Past Medical History:  Diagnosis Date   Acid reflux disease    Allergic rhinitis    Anxiety    Asthma    Barrett esophagus    Dyspnea    Dysthymia    Hypercholesterolemia    Hypertension    Irritable bowel syndrome    Nephrolithiasis    Osteopenia    Osteoporosis    Other chest pain    Pulmonary fibrosis ([facility])    Sjogren's disease ([place])    Status post dilation of esophageal narrowing     Family History  Problem Relation Age of Onset   Heart disease Mother    Diabetes Mother    Heart disease Father    Diabetes Sister    Heart disease Sister    Diabetes Brother    Heart disease Brother    Heart disease Brother    Cancer Maternal Uncle        type unknown   Throat cancer Paternal Uncle    Colon cancer Neg Hx    Stomach cancer Neg Hx    Rectal cancer Neg Hx    Pancreatic cancer Neg Hx    Past Surgical History:  Procedure Laterality Date   ESOPHAGOGASTRODUODENOSCOPY (  EGD) WITH PROPOFOL N/A 05/02/2019   Procedure: ESOPHAGOGASTRODUODENOSCOPY (EGD) WITH PROPOFOL;  Surgeon: Mauri Pole, MD;  Location: [facility];  Service: Endoscopy;  Laterality: N/A;  needing APC available   FACIAL COSMETIC SURGERY     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 [place]   Social History   Social History Narrative   Not on file   Immunization History  Administered Date(s) Administered   Fluad Quad(high Dose 65+) 01/21/2021   Influenza Split 02/01/2012   Influenza Whole 01/31/2009   Influenza, High Dose Seasonal PF 02/17/2016, 01/25/2017, 01/23/2019, 01/30/2020,  02/11/2021   Influenza,inj,Quad PF,6+ Mos 03/28/2015   Influenza,inj,quad, With Preservative 01/19/2019   Influenza-Unspecified 02/01/2017   PFIZER(Purple Top)SARS-COV-2 Vaccination 05/25/2019, 06/21/2019, 02/27/2020   Pneumococcal Conjugate-13 03/28/2015   Pneumococcal Polysaccharide-23 11/11/2015, 05/14/2016, 01/30/2020, 02/11/2021   Tdap 08/17/2011   Zoster Recombinat (Shingrix) 07/14/2017, 11/11/2017     Objective: Vital Signs: BP 131/74 (BP Location: Right Arm, Patient Position: Sitting, Cuff Size: Normal)    Pulse 96    Resp 15    Ht 5' 3"  (1.6 m)    Wt 144 lb (65.3 kg)    BMI 25.51 kg/m    Physical Exam HENT:     Head:     Comments: Preauricular cyst with swelling and inflammation as in attached photo    Right Ear: External ear normal.     Left Ear: External ear normal.     Mouth/Throat:     Mouth: Mucous membranes are moist.     Pharynx: Oropharynx is clear.  Eyes:     Comments: Mild conjunctival injection  Cardiovascular:     Rate and Rhythm: Normal rate and regular rhythm.  Pulmonary:     Effort: Pulmonary effort is normal.     Comments: Inspiratory crackles in bilateral lung bases Musculoskeletal:     Right lower leg: No edema.     Left lower leg: No edema.  Skin:    General: Skin is warm and dry.     Findings: No rash.  Neurological:     Mental Status: She is alert.     Deep Tendon Reflexes: Reflexes normal.  Psychiatric:        Mood and Affect: Mood normal.       Musculoskeletal Exam:  Neck full ROM no tenderness Shoulders full ROM no tenderness or swelling Elbows full ROM no tenderness or swelling Wrists full ROM no tenderness or swelling Fingers full ROM, mild bony nodule changes at right 2nd and 3rd DIPs lateral border, no synovitis Knees full ROM no tenderness or swelling Ankles full ROM no tenderness or swelling   Investigation: No additional findings.  Imaging: OCT, Retina - OU - Both Eyes  Result Date: 06/09/2021 Right Eye Quality was  good. Scan locations included subfoveal. Central Foveal Thickness: 267. Progression has been stable. Findings include epiretinal membrane. Left Eye Quality was good. Scan locations included subfoveal. Central Foveal Thickness: 300. Progression has been stable. Findings include epiretinal membrane. Notes None foveal distorting epiretinal membrane superiorly OD, observe With epiretinal membrane overlying the fovea with pseudo-CME Perifoveal, schisis yet with good acuity okay to observe   Recent Labs: Lab Results  Component Value Date   WBC 4.9 06/19/2021   HGB 13.4 06/19/2021   PLT 239.0 06/19/2021   NA 140 06/19/2021   K 3.7 06/19/2021   CL 104 06/19/2021   CO2 28 06/19/2021   GLUCOSE 83 06/19/2021   BUN 16 06/19/2021  CREATININE 0.96 06/19/2021   BILITOT 0.4 01/21/2021   ALKPHOS 42 01/21/2021   AST 25 01/21/2021   ALT 20 01/21/2021   PROT 7.2 01/21/2021   ALBUMIN 4.3 01/21/2021   CALCIUM 9.4 06/19/2021   GFRAA  08/11/2008    >60        The eGFR has been calculated using the MDRD equation. This calculation has not been validated in all clinical situations. eGFR's persistently <60 mL/min signify possible Chronic Kidney Disease.    Speciality Comments: No specialty comments available.  Procedures:  No procedures performed Allergies: Bupropion, Bupropion hcl, Doxycycline hyclate, and Erythromycin ethylsuccinate   Assessment / Plan:     Visit Diagnoses: Sjogren's syndrome, with unspecified organ involvement ([place]) - Plan: Sedimentation rate, C3 and C4, Rheumatoid factor, Serum protein electrophoresis with reflex  Symptoms look consistent with sjogren's syndrome with glandular involvement. I do not see any obvious skin or joint changes. Checking RF, ESR, complement, SPEP for markers of systemic activity and risk prognosis.  ILD (interstitial lung disease) ([place])  UIP pattern disease on CT although PFTs more restrictive. May be related to underlying sjogren's although disease  specific immunosuppression has very little data of efficacy in this so would agree with proceeding to antifibrotic treatment if appropriate.  Preauricular cyst - Plan: sulfamethoxazole-trimethoprim (BACTRIM DS) 800-160 MG tablet  Chronic lesion probably a benign soft tissue cyst but currently inflamed and painful. She does not tolerate doxycycline. Start bactrim DS BID for 5 days. She would benefit with surgical removal and biopsy due to underlying disease ruling out parotid gland or lymph node involvement. Previous patient of Dr. Wilburn Cornelia, will refer on urgent basis to be seen more quickly for this.  High risk medication use  May be a candidate for hydroxychloroquine although has existing retinal disease would need to clarify with ophthalmology.   Gastric antral vascular ectasia  There is association with systemic sjogren's syndrome involvement. She does not have have evidence of other major vascular involvements.  Orders: Orders Placed This Encounter  Procedures   Sedimentation rate   C3 and C4   Rheumatoid factor   Serum protein electrophoresis with reflex   Meds ordered this encounter  Medications   sulfamethoxazole-trimethoprim (BACTRIM DS) 800-160 MG tablet    Sig: Take 1 tablet by mouth 2 (two) times daily.    Dispense:  10 tablet    Refill:  0     Follow-Up Instructions: Return in about 6 weeks (around 08/08/2021) for New pt f/u Sjogren's syndrome.   Collier Salina, MD  Note - This record has been created using [employer].  Chart creation errors have been sought, but may not always  have been located. Such creation errors do not reflect on  the standard of medical care.

## 2021-06-27 ENCOUNTER — Encounter: Payer: Self-pay | Admitting: Internal Medicine

## 2021-06-27 ENCOUNTER — Ambulatory Visit: Payer: PPO | Admitting: Internal Medicine

## 2021-06-27 ENCOUNTER — Other Ambulatory Visit: Payer: Self-pay | Admitting: *Deleted

## 2021-06-27 ENCOUNTER — Other Ambulatory Visit: Payer: Self-pay

## 2021-06-27 ENCOUNTER — Telehealth: Payer: Self-pay

## 2021-06-27 VITALS — BP 131/74 | HR 96 | Resp 15 | Ht 63.0 in | Wt 144.0 lb

## 2021-06-27 DIAGNOSIS — G4700 Insomnia, unspecified: Secondary | ICD-10-CM | POA: Insufficient documentation

## 2021-06-27 DIAGNOSIS — K31819 Angiodysplasia of stomach and duodenum without bleeding: Secondary | ICD-10-CM | POA: Diagnosis not present

## 2021-06-27 DIAGNOSIS — J3081 Allergic rhinitis due to animal (cat) (dog) hair and dander: Secondary | ICD-10-CM | POA: Insufficient documentation

## 2021-06-27 DIAGNOSIS — R87619 Unspecified abnormal cytological findings in specimens from cervix uteri: Secondary | ICD-10-CM | POA: Insufficient documentation

## 2021-06-27 DIAGNOSIS — K529 Noninfective gastroenteritis and colitis, unspecified: Secondary | ICD-10-CM | POA: Insufficient documentation

## 2021-06-27 DIAGNOSIS — Q181 Preauricular sinus and cyst: Secondary | ICD-10-CM | POA: Diagnosis not present

## 2021-06-27 DIAGNOSIS — F32A Depression, unspecified: Secondary | ICD-10-CM | POA: Insufficient documentation

## 2021-06-27 DIAGNOSIS — Z79899 Other long term (current) drug therapy: Secondary | ICD-10-CM | POA: Diagnosis not present

## 2021-06-27 DIAGNOSIS — M3500 Sicca syndrome, unspecified: Secondary | ICD-10-CM

## 2021-06-27 DIAGNOSIS — Z79890 Hormone replacement therapy (postmenopausal): Secondary | ICD-10-CM | POA: Insufficient documentation

## 2021-06-27 DIAGNOSIS — J301 Allergic rhinitis due to pollen: Secondary | ICD-10-CM | POA: Diagnosis not present

## 2021-06-27 DIAGNOSIS — D649 Anemia, unspecified: Secondary | ICD-10-CM | POA: Insufficient documentation

## 2021-06-27 DIAGNOSIS — J3089 Other allergic rhinitis: Secondary | ICD-10-CM | POA: Diagnosis not present

## 2021-06-27 DIAGNOSIS — J849 Interstitial pulmonary disease, unspecified: Secondary | ICD-10-CM

## 2021-06-27 MED ORDER — SULFAMETHOXAZOLE-TRIMETHOPRIM 800-160 MG PO TABS: 1.0000 | ORAL_TABLET | Freq: Two times a day (BID) | ORAL | 0 refills | Status: DC

## 2021-06-27 NOTE — Telephone Encounter (Signed)
Referral faxed, pending appt ?

## 2021-06-27 NOTE — Patient Instructions (Addendum)
I am prescribing a 5 day course of oral antibiotic for the inflamed cyst since this may have some local infection. ? ?We will contact Dr. Victorio Palm office asking them to see you for surgical removal, and to send this for pathology review in case it is related to lymph node or gland swelling with sjogren's syndrome. ? ?I am checking additional blood tests for evidence of sjogren's syndrome as a cause of inflammation outside of the gland involvement. ? ?I will be contacting Dr. Erin Fulling and Dr. Zadie Rhine clarifying the treatment plan based on the findings from workup today. ?

## 2021-06-27 NOTE — Telephone Encounter (Signed)
Mardene Celeste from [facility] called to let Dr. Benjamine Mola know that she will call the patient to schedule an urgent appointment.  Please fax referral to [phone] Attn: Mardene Celeste ?If you have any additional questions, please call back at [phone] ?

## 2021-06-29 ENCOUNTER — Encounter: Payer: Self-pay | Admitting: Internal Medicine

## 2021-06-30 ENCOUNTER — Other Ambulatory Visit: Payer: PPO | Admitting: Pharmacist

## 2021-06-30 NOTE — Progress Notes (Unsigned)
Subjective:  Patient presents today to [facility] to see pharmacy team for Ofev and [place] counseling.   Patient was last seen and referred by *** on ***. Pertinent past medical history includes ***. Prior therapy includes ***.  History of CAD: {yes/no:20286} History of MI: {yes/no:20286} Current anticoagulant use: {yes/no:20286} History of HTN: Yes  History of elevated LFTs: {yes/no:20286} History of diarrhea, nausea, vomiting: {yes/no:20286}  Objective: Allergies  Allergen Reactions   Bupropion Swelling   Bupropion Hcl Swelling    WELLBUTRIN   Doxycycline Hyclate Other (See Comments)    upset  stomach   Erythromycin Ethylsuccinate Other (See Comments)     upset stomach    Outpatient Encounter Medications as of 07/02/2021  Medication Sig   albuterol (VENTOLIN HFA) 108 (90 Base) MCG/ACT inhaler 1-2 puff as needed   amLODipine (NORVASC) 10 MG tablet TAKE 1 TABLET(10 MG) BY MOUTH DAILY   Artificial Tear Solution (TEARS NATURALE OP) Place 1 drop into both eyes See admin instructions. Use 1 drop into both eyes (scheduled) in the morning & may use 3 times daily if needed for dry /irritated eyes.   atorvastatin (LIPITOR) 40 MG tablet Take 1 tablet (40 mg total) by mouth daily. Please keep upcoming appt with Dr. [name] in April 2023 before anymore refills. Thank you Final attempt   azelastine (OPTIVAR) 0.05 % ophthalmic solution Place 1 drop into both eyes daily as needed for allergies.   Azelastine HCl 0.15 % SOLN USE 2 SPRAYS IN EACH NOSTRIL IN THE MORNING AND AT BEDTIME   benzonatate (TESSALON PERLES) 100 MG capsule Take 1 capsule (100 mg total) by mouth 3 (three) times daily as needed.   BREZTRI AEROSPHERE 160-9-4.8 MCG/ACT AERO Inhale into the lungs.   budesonide-formoterol (SYMBICORT) 160-4.5 MCG/ACT inhaler Inhale 2 puffs into the lungs 2 (two) times daily.   clonazePAM (KLONOPIN) 0.25 MG disintegrating tablet DISSOLVE ONE TABLET BY MOUTH EVERY DAY AS NEEDED   EPIPEN  2-PAK 0.3 MG/0.3ML SOAJ injection Inject 0.3 mg into the muscle as needed for anaphylaxis.    escitalopram (LEXAPRO) 10 MG tablet Take 1 tablet (10 mg total) by mouth daily.   estrogen, conjugated,-medroxyprogesterone (PREMPRO) 0.45-1.5 MG per tablet Take 1 tablet by mouth at bedtime.    fluticasone (FLONASE) 50 MCG/ACT nasal spray Place 2 sprays into both nostrils daily.   ipratropium (ATROVENT) 0.03 % nasal spray Place 2 sprays into both nostrils every 12 (twelve) hours.   lansoprazole (PREVACID) 30 MG capsule Take 1 capsule (30 mg total) by mouth 2 (two) times daily before a meal.   montelukast (SINGULAIR) 10 MG tablet Take 1 tablet (10 mg total) by mouth at bedtime.   Olopatadine HCl 0.7 % SOLN Place 1 drop into both eyes daily as needed (allergies.).    polyethylene glycol (MIRALAX / GLYCOLAX) 17 g packet Take 17 g by mouth daily as needed for mild constipation.    Prenatal Vit-Fe Fumarate-FA (PRENATAL VITAMIN PO) Take by mouth.   Probiotic Product (PROBIOTIC COLON SUPPORT PO) Take 1 capsule by mouth at bedtime.    Spacer/Aero-Holding Chambers (VALVED HOLDING CHAMBER) DEVI SMARTSIG:Inhalation Via Inhaler As Directed   sulfamethoxazole-trimethoprim (BACTRIM DS) 800-160 MG tablet Take 1 tablet by mouth 2 (two) times daily.   No facility-administered encounter medications on file as of 07/02/2021.     Immunization History  Administered Date(s) Administered   Fluad Quad(high Dose 65+) 01/21/2021   Influenza Split 02/01/2012   Influenza Whole 01/31/2009   Influenza, High Dose Seasonal PF 02/17/2016,  01/25/2017, 01/23/2019, 01/30/2020, 02/11/2021   Influenza,inj,Quad PF,6+ Mos 03/28/2015   Influenza,inj,quad, With Preservative 01/19/2019   Influenza-Unspecified 02/01/2017   PFIZER(Purple Top)SARS-COV-2 Vaccination 05/25/2019, 06/21/2019, 02/27/2020   Pneumococcal Conjugate-13 03/28/2015   Pneumococcal Polysaccharide-23 11/11/2015, 05/14/2016, 01/30/2020, 02/11/2021   Tdap 08/17/2011    Zoster Recombinat (Shingrix) 07/14/2017, 11/11/2017      PFT's TLC  Date Value Ref Range Status  04/08/2021 5.14 L Final      CMP     Component Value Date/Time   NA 140 06/19/2021 1239   K 3.7 06/19/2021 1239   CL 104 06/19/2021 1239   CO2 28 06/19/2021 1239   GLUCOSE 83 06/19/2021 1239   BUN 16 06/19/2021 1239   CREATININE 0.96 06/19/2021 1239   CREATININE 0.99 11/24/2019 1140   CALCIUM 9.4 06/19/2021 1239   PROT 7.2 01/21/2021 1141   PROT 6.9 04/26/2020 1137   ALBUMIN 4.3 01/21/2021 1141   ALBUMIN 4.0 04/26/2020 1137   AST 25 01/21/2021 1141   ALT 20 01/21/2021 1141   ALKPHOS 42 01/21/2021 1141   BILITOT 0.4 01/21/2021 1141   BILITOT 0.3 04/26/2020 1137   GFRNONAA >60 04/16/2020 0801   GFRAA  08/11/2008 0542    >60        The eGFR has been calculated using the MDRD equation. This calculation has not been validated in all clinical situations. eGFR's persistently <60 mL/min signify possible Chronic Kidney Disease.      CBC    Component Value Date/Time   WBC 4.9 06/19/2021 1239   RBC 4.30 06/19/2021 1239   HGB 13.4 06/19/2021 1239   HCT 39.2 06/19/2021 1239   PLT 239.0 06/19/2021 1239   MCV 91.1 06/19/2021 1239   MCH 31.8 04/16/2020 0801   MCHC 34.2 06/19/2021 1239   RDW 14.3 06/19/2021 1239   LYMPHSABS 2.6 06/19/2021 1239   MONOABS 0.5 06/19/2021 1239   EOSABS 0.1 06/19/2021 1239   BASOSABS 0.0 06/19/2021 1239      LFT's Hepatic Function Latest Ref Rng & Units 01/21/2021 09/04/2020 04/26/2020  Total Protein 6.0 - 8.3 g/dL 7.2 7.4 6.9  Albumin 3.5 - 5.2 g/dL 4.3 4.4 4.0  AST 0 - 37 U/L _0 ALT 0 - 35 U/L _1 Alk Phosphatase 39 - 117 U/L 42 41 53  Total Bilirubin 0.2 - 1.2 mg/dL 0.4 0.5 0.3  Bilirubin, Direct 0.00 - 0.40 mg/dL - - 0.12      HRCT (***)  Assessment and Plan  Ofev and Esbriet Medication Management Thoroughly counseled patient on the efficacy, mechanism of action, dosing, administration, adverse effects, and  monitoring parameters of Ofev. Patient verbalized understanding. Patient education handout provided.   Goals of Therapy: Will not stop or reverse the progression of ILD. It will slow the progression of ILD.  Inhibits tyrosine kinase inhibitors which slow the fibrosis/progression of ILD -Significant reduction in the rate of disease progression was observed after treatment (61.1% [before] vs 33.3% [after], P?=?0.008) over 42 weeks.  Esbriet dosing: : Starting dose will be Esbriet 267 mg 1 tablet three times daily for 7 days, then 2 tablets three times daily for 7 days, then 3 tablets three times daily.  Maintenance dose will be 801 mg 1 tablet three times daily if tolerated.  Stressed the importance of taking with meals and space at least 5-6 hours apart to minimize stomach upset.   Esbriet Adverse Effects: Nausea, vomiting, diarrhea, weight loss Abdominal pain GERD Sun sensitivity/rash - patient advised to wear sunscreen  when exposed to sunlight Dizziness Fatigue  Ofev Dosing: 150 mg (one capsule) by mouth twice daily (approx 12 hours apart). Discussed taking with food approximately 12 hours apart. Discussed that capsule should not be crushed or split.  Adverse Effects: Nausea, vomiting, diarrhea (2 in 3 patients) appetite loss, weight loss - management of diarrhea with loperamide discussed including max use of 48 hours and max of 8 capsules per day. Abdominal pain (up to 1 in 5 patients) Nasopharyngitis (13%), UTI (6%) Risk of thrombosis (3%) and acute MI (2%) Hypertension (5%) Dizziness Fatigue (10%)  Monitoring: Monitor for diarrhea, nausea and vomiting, GI perforation, hepatotoxicity  Monitor LFTs - baseline, monthly for first 6 months, then every 3 months routinely Pregnancy test - baseline prn CBC w differential at baseline and every 3 months routinely  Access: Approval pending benefits investigation  Medication Reconciliation A drug regimen assessment was performed,  including review of allergies, interactions, disease-state management, dosing and immunization history. Medications were reviewed with the patient, including name, instructions, indication, goals of therapy, potential side effects, importance of adherence, and safe use.  Anticoagulant use: {yes/no:20286}  Immunizations Patient is indicated for the influenzae, pneumonia, and shingles vaccinations. Patient has received *** COVID19 vaccines.  This appointment required *** minutes of patient care (this includes precharting, chart review, review of results, face-to-face care, etc.).  Thank you for involving pharmacy to assist in providing this patient's care.    Knox Saliva, PharmD, MPH, BCPS Clinical Pharmacist (Rheumatology and Pulmonology)

## 2021-07-01 ENCOUNTER — Other Ambulatory Visit: Payer: PPO | Admitting: Pharmacist

## 2021-07-02 ENCOUNTER — Other Ambulatory Visit: Payer: Self-pay

## 2021-07-02 ENCOUNTER — Ambulatory Visit: Payer: PPO | Admitting: Pharmacist

## 2021-07-02 DIAGNOSIS — J849 Interstitial pulmonary disease, unspecified: Secondary | ICD-10-CM

## 2021-07-02 DIAGNOSIS — Z7189 Other specified counseling: Secondary | ICD-10-CM

## 2021-07-02 LAB — PROTEIN ELECTROPHORESIS, SERUM, WITH REFLEX
Albumin ELP: 4.3 g/dL (ref 3.8–4.8)
Alpha 1: 0.3 g/dL (ref 0.2–0.3)
Alpha 2: 0.9 g/dL (ref 0.5–0.9)
Beta 2: 0.4 g/dL (ref 0.2–0.5)
Beta Globulin: 0.4 g/dL (ref 0.4–0.6)
Gamma Globulin: 1 g/dL (ref 0.8–1.7)
Total Protein: 7.3 g/dL (ref 6.1–8.1)

## 2021-07-02 LAB — C3 AND C4
C3 Complement: 149 mg/dL (ref 83–193)
C4 Complement: 28 mg/dL (ref 15–57)

## 2021-07-02 LAB — RHEUMATOID FACTOR: Rheumatoid fact SerPl-aCnc: <14 IU/mL (ref <14)

## 2021-07-02 LAB — SEDIMENTATION RATE: Sed Rate: 19 mm/h (ref 0–30)

## 2021-07-03 ENCOUNTER — Telehealth: Payer: Self-pay | Admitting: Pharmacist

## 2021-07-03 DIAGNOSIS — Z5181 Encounter for therapeutic drug level monitoring: Secondary | ICD-10-CM

## 2021-07-03 DIAGNOSIS — Z7189 Other specified counseling: Secondary | ICD-10-CM

## 2021-07-03 DIAGNOSIS — J849 Interstitial pulmonary disease, unspecified: Secondary | ICD-10-CM

## 2021-07-03 NOTE — Telephone Encounter (Signed)
Submitted a Prior Authorization request to  [employer]  for OFEV via CoverMyMeds. Will update once we receive a response. ? ?Key: [id number] ? ?Dose: '150mg'$  twice daily ? 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 ?Patient portion of [employer] paperwork completed at pharmacy appt. Filed with income docs. ?Patient is seeing Dr. Vaughan Browner on 07/09/21 for second opinion. Provider portion needs to be placed in Dr. August Albino mailbox. ? ?Can proceed with coverage  ? ?Knox Saliva, PharmD, MPH, BCPS ?Clinical Pharmacist (Rheumatology and Pulmonology) ? ?

## 2021-07-04 ENCOUNTER — Other Ambulatory Visit (HOSPITAL_COMMUNITY): Payer: Self-pay

## 2021-07-04 DIAGNOSIS — J301 Allergic rhinitis due to pollen: Secondary | ICD-10-CM | POA: Diagnosis not present

## 2021-07-04 DIAGNOSIS — Q181 Preauricular sinus and cyst: Secondary | ICD-10-CM | POA: Diagnosis not present

## 2021-07-04 DIAGNOSIS — M3500 Sicca syndrome, unspecified: Secondary | ICD-10-CM | POA: Diagnosis not present

## 2021-07-04 DIAGNOSIS — J3089 Other allergic rhinitis: Secondary | ICD-10-CM | POA: Diagnosis not present

## 2021-07-04 NOTE — Telephone Encounter (Addendum)
Received notification from  [employer]  regarding a prior authorization for [place]. Authorization has been APPROVED from 07/03/21 to 07/03/22.  ? ?Per test claim, copay for 30 days supply is $2811.97 ? ?Will need to collect provider signature for [employer] pt assistance paperwork pending her OV with Dr. Vaughan Browner for second opinion ? ?Knox Saliva, PharmD, MPH, BCPS ?Clinical Pharmacist (Rheumatology and Pulmonology) ?

## 2021-07-07 ENCOUNTER — Other Ambulatory Visit: Payer: Self-pay | Admitting: Otolaryngology

## 2021-07-07 DIAGNOSIS — Q181 Preauricular sinus and cyst: Secondary | ICD-10-CM

## 2021-07-08 ENCOUNTER — Ambulatory Visit: Payer: PPO | Admitting: Gastroenterology

## 2021-07-09 ENCOUNTER — Other Ambulatory Visit: Payer: Self-pay

## 2021-07-09 ENCOUNTER — Ambulatory Visit: Payer: PPO | Admitting: Pulmonary Disease

## 2021-07-09 ENCOUNTER — Encounter: Payer: Self-pay | Admitting: Pulmonary Disease

## 2021-07-09 VITALS — BP 116/66 | HR 94 | Temp 98.4°F | Ht 63.0 in | Wt 146.6 lb

## 2021-07-09 DIAGNOSIS — J849 Interstitial pulmonary disease, unspecified: Secondary | ICD-10-CM

## 2021-07-09 DIAGNOSIS — M79672 Pain in left foot: Secondary | ICD-10-CM | POA: Diagnosis not present

## 2021-07-09 NOTE — Patient Instructions (Signed)
Based on review of your record I believe you may have Sjogren's related interstitial lung disease. ?Agree with starting an anti fibrotic agent.  In addition we could consider an agent to reduce your inflammation ?I will discuss with Dr. Erin Fulling and after conference to review your scan. ? ?Follow-up with Dr. Erin Fulling ? ?

## 2021-07-09 NOTE — Progress Notes (Addendum)
? ?      ?Sheri Andrews    [id number]    Aug 26, 1950 ? ?Primary Care Physician:Crawford, Real Cons, MD ? ?Referring Physician: [name], MD ?[address] ?[place],  [name] [address] ? ?Chief complaint: Consult for ILD ? ?HPI: ?71 year old with history of hypertension, asthma, hyperlipidemia, allergies.  Previously followed by Dr. Erin Fulling for asthma, new diagnosis of interstitial lung disease.  Work-up showed positive ANA and elevated SSA.  She has followed up with Dr. Benjamine Mola with a new diagnosis of Sjogren's syndrome.  Has been initiated on Ofev for progressive fibrosis and is planning on starting it soon ?Complains of mild dyspnea on exertion.  No cough, fevers, chills ?Has symptoms of joint pain, dry eyes ? ?Pets: No pets ?Occupation: Retired Radio producer ?Exposures: No mold, hot tub, Jacuzzi.  No feather pillows or comforters ?Smoking history: Never smoker ?Travel history: No significant travel history ?Relevant family history: No family history of lung disease ? ?Outpatient Encounter Medications as of 07/09/2021  ?Medication Sig  ? albuterol (VENTOLIN HFA) 108 (90 Base) MCG/ACT inhaler 1-2 puff as needed  ? amLODipine (NORVASC) 10 MG tablet TAKE 1 TABLET(10 MG) BY MOUTH DAILY  ? amoxicillin-clavulanate (AUGMENTIN) 500-125 MG tablet Take by mouth.  ? Artificial Tear Solution (TEARS NATURALE OP) Place 1 drop into both eyes See admin instructions. Use 1 drop into both eyes (scheduled) in the morning & may use 3 times daily if needed for dry /irritated eyes.  ? atorvastatin (LIPITOR) 40 MG tablet Take 1 tablet (40 mg total) by mouth daily. Please keep upcoming appt with Dr. [name] in April 2023 before anymore refills. Thank you Final attempt  ? azelastine (OPTIVAR) 0.05 % ophthalmic solution Place 1 drop into both eyes daily as needed for allergies.  ? Azelastine HCl 0.15 % SOLN USE 2 SPRAYS IN EACH NOSTRIL IN THE MORNING AND AT BEDTIME  ? benzonatate (TESSALON PERLES) 100 MG capsule Take 1  capsule (100 mg total) by mouth 3 (three) times daily as needed.  ? BREZTRI AEROSPHERE 160-9-4.8 MCG/ACT AERO Inhale into the lungs.  ? budesonide-formoterol (SYMBICORT) 160-4.5 MCG/ACT inhaler Inhale 2 puffs into the lungs 2 (two) times daily.  ? clonazePAM (KLONOPIN) 0.25 MG disintegrating tablet DISSOLVE ONE TABLET BY MOUTH EVERY DAY AS NEEDED  ? EPIPEN 2-PAK 0.3 MG/0.3ML SOAJ injection Inject 0.3 mg into the muscle as needed for anaphylaxis.   ? escitalopram (LEXAPRO) 10 MG tablet Take 1 tablet (10 mg total) by mouth daily.  ? estrogen, conjugated,-medroxyprogesterone (PREMPRO) 0.45-1.5 MG per tablet Take 1 tablet by mouth at bedtime.   ? fluticasone (FLONASE) 50 MCG/ACT nasal spray Place 2 sprays into both nostrils daily.  ? ipratropium (ATROVENT) 0.03 % nasal spray Place 2 sprays into both nostrils every 12 (twelve) hours.  ? lansoprazole (PREVACID) 30 MG capsule Take 1 capsule (30 mg total) by mouth 2 (two) times daily before a meal.  ? montelukast (SINGULAIR) 10 MG tablet Take 1 tablet (10 mg total) by mouth at bedtime.  ? Olopatadine HCl 0.7 % SOLN Place 1 drop into both eyes daily as needed (allergies.).   ? polyethylene glycol (MIRALAX / GLYCOLAX) 17 g packet Take 17 g by mouth daily as needed for mild constipation.   ? Prenatal Vit-Fe Fumarate-FA (PRENATAL VITAMIN PO) Take by mouth.  ? Probiotic Product (PROBIOTIC COLON SUPPORT PO) Take 1 capsule by mouth at bedtime.   ? Spacer/Aero-Holding Chambers (VALVED HOLDING CHAMBER) DEVI SMARTSIG:Inhalation Via Inhaler As Directed  ? [DISCONTINUED] sulfamethoxazole-trimethoprim (BACTRIM  DS) 800-160 MG tablet Take 1 tablet by mouth 2 (two) times daily.  ? ?No facility-administered encounter medications on file as of 07/09/2021.  ? ? ?Allergies as of 07/09/2021 - Review Complete 07/09/2021  ?Allergen Reaction Noted  ? Bupropion Swelling 06/27/2021  ? Bupropion hcl Swelling   ? Doxycycline hyclate Other (See Comments) 02/25/2005  ? Erythromycin ethylsuccinate Other  (See Comments) 02/25/2005  ? ? ?Past Medical History:  ?Diagnosis Date  ? Acid reflux disease   ? Allergic rhinitis   ? Anxiety   ? Asthma   ? Barrett esophagus   ? Dyspnea   ? Dysthymia   ? Hypercholesterolemia   ? Hypertension   ? Irritable bowel syndrome   ? Nephrolithiasis   ? Osteopenia   ? Osteoporosis   ? Other chest pain   ? Pulmonary fibrosis ([place])   ? Sjogren's disease ([name])   ? Status post dilation of esophageal narrowing   ? ? ?Past Surgical History:  ?Procedure Laterality Date  ? ESOPHAGOGASTRODUODENOSCOPY (EGD) WITH PROPOFOL N/A 05/02/2019  ? Procedure: ESOPHAGOGASTRODUODENOSCOPY (EGD) WITH PROPOFOL;  Surgeon: Mauri Pole, MD;  Location: [facility];  Service: Endoscopy;  Laterality: N/A;  needing APC available  ? FACIAL COSMETIC SURGERY    ? 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  ? TUBAL LIGATION  1986  ? ? ?Family History  ?Problem Relation Age of Onset  ? Heart disease Mother   ? Diabetes Mother   ? Heart disease Father   ? Diabetes Sister   ? Heart disease Sister   ? Diabetes Brother   ? Heart disease Brother   ? Heart disease Brother   ? Cancer Maternal Uncle   ?     type unknown  ? Throat cancer Paternal Uncle   ? Colon cancer Neg Hx   ? Stomach cancer Neg Hx   ? Rectal cancer Neg Hx   ? Pancreatic cancer Neg Hx   ? ? ?Social History  ? ?Socioeconomic History  ? Marital status: Married  ?  Spouse name: Not on file  ? Number of children: 2  ? Years of education: Not on file  ? Highest education level: Not on file  ?Occupational History  ? Occupation: retired  ?Tobacco Use  ? Smoking status: Never  ? Smokeless tobacco: Never  ?Vaping Use  ? Vaping Use: Never used  ?Substance and Sexual Activity  ? Alcohol use: Not Currently  ?  Alcohol/week: 2.0 standard drinks  ?  Types: 2 Shots of liquor per week  ? Drug use: No  ? Sexual activity: Yes  ?  Partners: Male  ?Other Topics Concern  ? Not on file  ?Social History Narrative  ? Not on file  ? ?Social Determinants of Health   ? ?Financial Resource Strain: Not on file  ?Food Insecurity: Not on file  ?Transportation Needs: Not on file  ?Physical Activity: Not on file  ?Stress: Not on file  ?Social Connections: Not on file  ?Intimate Partner Violence: Not on file  ? ? ?Review of systems: ?Review of Systems  ?Constitutional: Negative for fever and chills.  ?HENT: Negative.   ?Eyes: Negative for blurred vision.  ?Respiratory: as per HPI  ?Cardiovascular: Negative for chest pain and palpitations.  ?Gastrointestinal: Negative for vomiting, diarrhea, blood per rectum. ?Genitourinary: Negative for dysuria, urgency, frequency and hematuria.  ?Musculoskeletal: Negative for myalgias, back pain and joint pain.  ?Skin: Negative for itching and rash.  ?Neurological: Negative for dizziness, tremors,  focal weakness, seizures and loss of consciousness.  ?Endo/Heme/Allergies: Negative for environmental allergies.  ?Psychiatric/Behavioral: Negative for depression, suicidal ideas and hallucinations.  ?All other systems reviewed and are negative. ? ?Physical Exam: ?Blood pressure 116/66, pulse 94, temperature 98.4 ?F (36.9 ?C), temperature source Oral, height '5\' 3"'$  (1.6 m), weight 146 lb 9.6 oz (66.5 kg), SpO2 97 %. ?Gen:      No acute distress ?HEENT:  EOMI, sclera anicteric ?Neck:     No masses; no thyromegaly ?Lungs:    Clear to auscultation bilaterally; normal respiratory effort ?CV:         Regular rate and rhythm; no murmurs ?Abd:      + bowel sounds; soft, non-tender; no palpable masses, no distension ?Ext:    No edema; adequate peripheral perfusion ?Skin:      Warm and dry; no rash ?Neuro: alert and oriented x 3 ?Psych: normal mood and affect ? ?Data Reviewed: ?Imaging: ?Cardiac CT 01/25/2020-visualized lungs appear normal. ?High-res CT 04/29/2021-patchy areas of groundglass attenuation and septal thickening with minimal cylindrical traction bronchiectasis.  Probable UIP pattern.  I have reviewed the images personally. ? ?PFTs: ?04/08/2021 ?FVC 2.77  [97%], FEV1 1.37 [64%], F/F 50, TLC 5.14 [104%], DLCO 22.11 [117%] ?Mild obstruction ? ?Labs: ?CTD serologies 06/27/2021-ANA 1:40, cytoplasmic, SSA 6.4 ? ?Assessment:  ?Interstitial lung disease ?CT shows new finding

## 2021-07-10 ENCOUNTER — Encounter: Payer: Self-pay | Admitting: Pulmonary Disease

## 2021-07-10 DIAGNOSIS — J301 Allergic rhinitis due to pollen: Secondary | ICD-10-CM | POA: Diagnosis not present

## 2021-07-10 DIAGNOSIS — J3089 Other allergic rhinitis: Secondary | ICD-10-CM | POA: Diagnosis not present

## 2021-07-10 NOTE — Telephone Encounter (Signed)
Called and spoke to pt, pt had questions in regards to her AVS on my chart. Helped patient locate locate AVS. Nothing further needed  ?

## 2021-07-11 NOTE — Telephone Encounter (Signed)
Patient requested a return call with an update on her prescription of OFEV.   ?

## 2021-07-11 NOTE — Telephone Encounter (Signed)
Returned call to patient and advised of copay through insurance. Advised that we are in the process of Ofev PAP and will likely require 2 additional weeks to hear determination ? ?Knox Saliva, PharmD, MPH, BCPS ?Clinical Pharmacist (Rheumatology and Pulmonology) ?

## 2021-07-11 NOTE — Telephone Encounter (Signed)
Dr. Vaughan Browner agrees with plan to start antifibrotic.  Provider form for [facility] will need to be placed in Dr. August Albino mailbox. ? ?Knox Saliva, PharmD, MPH, BCPS ?Clinical Pharmacist (Rheumatology and Pulmonology) ?

## 2021-07-14 NOTE — Telephone Encounter (Signed)
[employer] provider form for Ofev patient assistance placed in Dr. August Albino mailbox to be signed ? ?Knox Saliva, PharmD, MPH, BCPS ?Clinical Pharmacist (Rheumatology and Pulmonology) ?

## 2021-07-18 ENCOUNTER — Other Ambulatory Visit (HOSPITAL_COMMUNITY): Payer: Self-pay

## 2021-07-18 NOTE — Telephone Encounter (Signed)
Submitted Patient Assistance Application to [employer] for OFEV along with provider portion, PA and income documents. Will update patient when we receive a response.  Fax# [phone] Phone# [phone] 

## 2021-07-21 NOTE — Telephone Encounter (Signed)
Received a fax from  [facility] regarding an approval for [place] patient assistance from 07/18/21 to 04/19/22.  ? ?She will be receiving first Ofev shipment on 07/23/21. ? ?Phone number: [phone] ?She will stop by clinic for updated CMET prior to started Ofev. Provided with lab hours. future order for CMET placed today. ? ?She did not have further questions about Ofev at this time. She and her husband were counseled on Ofev on 07/02/21. She states she has picked up loperamide from local pharmacy already. ? ?Knox Saliva, PharmD, MPH, BCPS ?Clinical Pharmacist (Rheumatology and Pulmonology) ? ?

## 2021-07-23 ENCOUNTER — Ambulatory Visit: Payer: PPO | Admitting: Cardiovascular Disease

## 2021-07-23 DIAGNOSIS — M25572 Pain in left ankle and joints of left foot: Secondary | ICD-10-CM | POA: Diagnosis not present

## 2021-07-24 ENCOUNTER — Other Ambulatory Visit: Payer: Self-pay | Admitting: Cardiovascular Disease

## 2021-07-24 NOTE — Telephone Encounter (Signed)
CMET not yet drawn. MyChart message sent to patient as reminder to have lab completed prior to starting Ofev ? ?Knox Saliva, PharmD, MPH, BCPS ?Clinical Pharmacist (Rheumatology and Pulmonology) ?

## 2021-08-04 ENCOUNTER — Other Ambulatory Visit (INDEPENDENT_AMBULATORY_CARE_PROVIDER_SITE_OTHER): Payer: PPO

## 2021-08-04 ENCOUNTER — Encounter (HOSPITAL_COMMUNITY): Payer: Self-pay | Admitting: Gastroenterology

## 2021-08-04 DIAGNOSIS — Z5181 Encounter for therapeutic drug level monitoring: Secondary | ICD-10-CM

## 2021-08-04 LAB — COMPREHENSIVE METABOLIC PANEL
ALT: 19 U/L (ref 0–35)
AST: 24 U/L (ref 0–37)
Albumin: 4.2 g/dL (ref 3.5–5.2)
Alkaline Phosphatase: 44 U/L (ref 39–117)
BUN: 15 mg/dL (ref 6–23)
CO2: 26 mEq/L (ref 19–32)
Calcium: 9.4 mg/dL (ref 8.4–10.5)
Chloride: 105 mEq/L (ref 96–112)
Creatinine, Ser: 0.97 mg/dL (ref 0.40–1.20)
GFR: 58.85 mL/min — ABNORMAL LOW (ref >60.00)
Glucose, Bld: 103 mg/dL — ABNORMAL HIGH (ref 70–99)
Potassium: 3.7 mEq/L (ref 3.5–5.1)
Sodium: 138 mEq/L (ref 135–145)
Total Bilirubin: 0.4 mg/dL (ref 0.2–1.2)
Total Protein: 7.2 g/dL (ref 6.0–8.3)

## 2021-08-05 ENCOUNTER — Ambulatory Visit
Admission: RE | Admit: 2021-08-05 | Discharge: 2021-08-05 | Disposition: A | Discharge status: Home / Self Care | Payer: PPO | Source: Ambulatory Visit | Attending: Otolaryngology | Admitting: Otolaryngology

## 2021-08-05 DIAGNOSIS — M47812 Spondylosis without myelopathy or radiculopathy, cervical region: Secondary | ICD-10-CM | POA: Diagnosis not present

## 2021-08-05 DIAGNOSIS — Q181 Preauricular sinus and cyst: Secondary | ICD-10-CM

## 2021-08-05 DIAGNOSIS — J341 Cyst and mucocele of nose and nasal sinus: Secondary | ICD-10-CM | POA: Diagnosis not present

## 2021-08-05 DIAGNOSIS — J3489 Other specified disorders of nose and nasal sinuses: Secondary | ICD-10-CM | POA: Diagnosis not present

## 2021-08-05 MED ORDER — IOPAMIDOL (ISOVUE-300) INJECTION 61%
75.0000 mL | Freq: Once | INTRAVENOUS | Status: AC | PRN
  Administered 2021-08-05: 75 mL via INTRAVENOUS

## 2021-08-06 DIAGNOSIS — M79672 Pain in left foot: Secondary | ICD-10-CM | POA: Diagnosis not present

## 2021-08-07 ENCOUNTER — Telehealth: Payer: Self-pay | Admitting: Internal Medicine

## 2021-08-07 MED ORDER — OFEV 150 MG PO CAPS: 150.0000 mg | ORAL_CAPSULE | Freq: Two times a day (BID) | ORAL | 1 refills | Status: DC

## 2021-08-07 NOTE — Telephone Encounter (Signed)
Patient left a voicemail stating she was having paperwork sent from Dr. Luan Pulling and wanted to make sure it was received by Dr. Benjamine Mola. Patient requests a return call if that paperwork was received.  ?

## 2021-08-07 NOTE — Telephone Encounter (Signed)
CMET drawn on 08/04/21 - wnl to proceed with Ofev '150mg'$  twice daily ? ?Added to patient's med list today as no-print ? ?Knox Saliva, PharmD, MPH, BCPS ?Clinical Pharmacist (Rheumatology and Pulmonology) ?

## 2021-08-08 ENCOUNTER — Encounter: Payer: Self-pay | Admitting: Internal Medicine

## 2021-08-11 ENCOUNTER — Ambulatory Visit (HOSPITAL_COMMUNITY): Payer: PPO | Admitting: Registered Nurse

## 2021-08-11 ENCOUNTER — Other Ambulatory Visit: Payer: Self-pay

## 2021-08-11 ENCOUNTER — Ambulatory Visit (HOSPITAL_COMMUNITY)
Admission: RE | Admit: 2021-08-11 | Discharge: 2021-08-11 | Disposition: A | Discharge status: Home / Self Care | Payer: PPO | Source: Ambulatory Visit | Attending: Gastroenterology | Admitting: Gastroenterology

## 2021-08-11 ENCOUNTER — Ambulatory Visit (HOSPITAL_BASED_OUTPATIENT_CLINIC_OR_DEPARTMENT_OTHER): Payer: PPO | Admitting: Registered Nurse

## 2021-08-11 ENCOUNTER — Encounter (HOSPITAL_COMMUNITY): Payer: Self-pay | Admitting: Gastroenterology

## 2021-08-11 ENCOUNTER — Encounter (HOSPITAL_COMMUNITY)
Admission: RE | Disposition: A | Discharge status: Home / Self Care | Payer: Self-pay | Source: Ambulatory Visit | Attending: Gastroenterology

## 2021-08-11 DIAGNOSIS — J45909 Unspecified asthma, uncomplicated: Secondary | ICD-10-CM | POA: Diagnosis not present

## 2021-08-11 DIAGNOSIS — K921 Melena: Secondary | ICD-10-CM | POA: Diagnosis not present

## 2021-08-11 DIAGNOSIS — J84112 Idiopathic pulmonary fibrosis: Secondary | ICD-10-CM

## 2021-08-11 DIAGNOSIS — K2970 Gastritis, unspecified, without bleeding: Secondary | ICD-10-CM

## 2021-08-11 DIAGNOSIS — K589 Irritable bowel syndrome without diarrhea: Secondary | ICD-10-CM | POA: Insufficient documentation

## 2021-08-11 DIAGNOSIS — K259 Gastric ulcer, unspecified as acute or chronic, without hemorrhage or perforation: Secondary | ICD-10-CM | POA: Diagnosis not present

## 2021-08-11 DIAGNOSIS — I998 Other disorder of circulatory system: Secondary | ICD-10-CM

## 2021-08-11 DIAGNOSIS — I10 Essential (primary) hypertension: Secondary | ICD-10-CM | POA: Diagnosis not present

## 2021-08-11 DIAGNOSIS — Z8719 Personal history of other diseases of the digestive system: Secondary | ICD-10-CM | POA: Insufficient documentation

## 2021-08-11 DIAGNOSIS — K254 Chronic or unspecified gastric ulcer with hemorrhage: Secondary | ICD-10-CM

## 2021-08-11 DIAGNOSIS — K219 Gastro-esophageal reflux disease without esophagitis: Secondary | ICD-10-CM | POA: Insufficient documentation

## 2021-08-11 DIAGNOSIS — F418 Other specified anxiety disorders: Secondary | ICD-10-CM

## 2021-08-11 HISTORY — PX: ESOPHAGOGASTRODUODENOSCOPY (EGD) WITH PROPOFOL: SHX5813

## 2021-08-11 SURGERY — ESOPHAGOGASTRODUODENOSCOPY (EGD) WITH PROPOFOL
Anesthesia: Monitor Anesthesia Care

## 2021-08-11 MED ORDER — LACTATED RINGERS IV SOLN: INTRAVENOUS | Status: DC

## 2021-08-11 MED ORDER — ONDANSETRON HCL 4 MG/2ML IJ SOLN
INTRAMUSCULAR | Status: DC | PRN
  Administered 2021-08-11: 4 mg via INTRAVENOUS

## 2021-08-11 MED ORDER — SUCRALFATE 1 G PO TABS: 1.0000 g | ORAL_TABLET | Freq: Three times a day (TID) | ORAL | 0 refills | Status: DC

## 2021-08-11 MED ORDER — PROPOFOL 10 MG/ML IV BOLUS
INTRAVENOUS | Status: DC | PRN
  Administered 2021-08-11: 20 mg via INTRAVENOUS
  Administered 2021-08-11: 60 mg via INTRAVENOUS
  Administered 2021-08-11: 20 mg via INTRAVENOUS
  Administered 2021-08-11: 40 mg via INTRAVENOUS

## 2021-08-11 MED ORDER — SODIUM CHLORIDE 0.9 % IV SOLN: INTRAVENOUS | Status: DC

## 2021-08-11 MED ORDER — LIDOCAINE 2% (20 MG/ML) 5 ML SYRINGE
INTRAMUSCULAR | Status: DC | PRN
  Administered 2021-08-11: 60 mg via INTRAVENOUS

## 2021-08-11 SURGICAL SUPPLY — 15 items

## 2021-08-11 NOTE — Op Note (Signed)
[facility] ?Patient Name: Sheri Andrews ?Procedure Date: 08/11/2021 ?MRN: [id number] ?Attending MD: Mauri Pole , MD ?Date of Birth: September 19, 1950 ?CSN: [id number] ?Age: 71 ?Admit Type: Outpatient ?Procedure:                Upper GI endoscopy ?Indications:              Suspected upper gastrointestinal bleeding, Melena,  ?                          h/o gastric AVM's ?Providers:                Mauri Pole, MD, Doristine Johns, RN,  ?                          William [name], Technician, Gloris Ham,  ?                          Technician ?Referring MD:              ?Medicines:                Monitored Anesthesia Care ?Complications:            No immediate complications. ?Estimated Blood Loss:     Estimated blood loss was minimal. ?Procedure:                Pre-Anesthesia Assessment: ?                          - Prior to the procedure, a History and Physical  ?                          was performed, and patient medications and  ?                          allergies were reviewed. The patient's tolerance of  ?                          previous anesthesia was also reviewed. The risks  ?                          and benefits of the procedure and the sedation  ?                          options and risks were discussed with the patient.  ?                          All questions were answered, and informed consent  ?                          was obtained. Prior Anticoagulants: The patient has  ?                          taken no previous anticoagulant or antiplatelet  ?                          agents. ASA Grade Assessment: III -  A patient with  ?                          severe systemic disease. After reviewing the risks  ?                          and benefits, the patient was deemed in  ?                          satisfactory condition to undergo the procedure. ?                          After obtaining informed consent, the endoscope was  ?                          passed under direct  vision. Throughout the  ?                          procedure, the patient's blood pressure, pulse, and  ?                          oxygen saturations were monitored continuously. The  ?                          GIF-H190 ([id number]) Olympus endoscope was introduced  ?                          through the mouth, and advanced to the second part  ?                          of duodenum. The upper GI endoscopy was  ?                          accomplished without difficulty. The patient  ?                          tolerated the procedure well. ?Scope In: ?Scope Out: ?Findings: ?     The Z-line was regular and was found 35 cm from the incisors. ?     The examined esophagus was normal. ?     A few erosions with no bleeding and no stigmata of recent bleeding were  ?     found in the gastric antrum. ?     The cardia and gastric fundus were normal on retroflexion. ?     The examined duodenum was normal. ?Impression:               - Z-line regular, 35 cm from the incisors. ?                          - Normal esophagus. ?                          - Gastric erosions with no bleeding and no stigmata  ?  of recent bleeding. ?                          - Normal examined duodenum. ?                          - No specimens collected. ?Moderate Sedation: ?     Not Applicable - Patient had care per Anesthesia. ?Recommendation:           - Patient has a contact number available for  ?                          emergencies. The signs and symptoms of potential  ?                          delayed complications were discussed with the  ?                          patient. Return to normal activities tomorrow.  ?                          Written discharge instructions were provided to the  ?                          patient. ?                          - Resume previous diet. ?                          - Continue present medications. ?                          - No aspirin, ibuprofen, naproxen, or other  ?                           non-steroidal anti-inflammatory drugs. ?                          - Follow an antireflux regimen. ?                          - Use Prevacid (lansoprazole) 30 mg PO BID. ?                          - Use sucralfate tablets 1 gram PO QID for 2 weeks. ?                          - Return to GI office in 3 months. ?Procedure Code(s):        --- Professional --- ?                          [phone], Esophagogastroduodenoscopy, flexible,  ?                          transoral; diagnostic, including collection of  ?  specimen(s) by brushing or washing, when performed  ?                          (separate procedure) ?Diagnosis Code(s):        --- Professional --- ?                          K25.9, Gastric ulcer, unspecified as acute or  ?                          chronic, without hemorrhage or perforation ?                          K92.1, Melena (includes Hematochezia) ?CPT copyright 2019 American Medical Association. All rights reserved. ?The codes documented in this report are preliminary and upon coder review may  ?be revised to meet current compliance requirements. ?Mauri Pole, MD ?08/11/2021 10:53:00 AM ?This report has been signed electronically. ?Number of Addenda: 0 ?

## 2021-08-11 NOTE — Anesthesia Postprocedure Evaluation (Signed)
Anesthesia Post Note ? ?Patient: Sheri Andrews ? ?Procedure(s) Performed: ESOPHAGOGASTRODUODENOSCOPY (EGD) WITH PROPOFOL ? ?  ? ?Patient location during evaluation: Endoscopy ?Anesthesia Type: MAC ?Level of consciousness: awake ?Pain management: pain level controlled ?Vital Signs Assessment: post-procedure vital signs reviewed and stable ?Respiratory status: spontaneous breathing, nonlabored ventilation, respiratory function stable and patient connected to nasal cannula oxygen ?Cardiovascular status: stable and blood pressure returned to baseline ?Postop Assessment: no apparent nausea or vomiting ?Anesthetic complications: no ? ? ?No notable events documented. ? ?Last Vitals:  ?Vitals:  ? 08/11/21 1100 08/11/21 1110  ?BP: 139/75 (!) 143/71  ?Pulse: 70 64  ?Resp: 12 14  ?Temp:    ?SpO2: 100% 98%  ?  ?Last Pain:  ?Vitals:  ? 08/11/21 1110  ?TempSrc:   ?PainSc: 0-No pain  ? ? ?  ?  ?  ?  ?  ?  ? ?Siara Gorder P Demarious Kapur ? ? ? ? ?

## 2021-08-11 NOTE — Discharge Instructions (Signed)
YOU HAD AN ENDOSCOPIC PROCEDURE TODAY: Refer to the procedure report and other information in the discharge instructions given to you for any specific questions about what was found during the examination. If this information does not answer your questions, please call [place] office at [phone] to clarify.   YOU SHOULD EXPECT: Some feelings of bloating in the abdomen. Passage of more gas than usual. Walking can help get rid of the air that was put into your GI tract during the procedure and reduce the bloating. If you had a lower endoscopy (such as a colonoscopy or flexible sigmoidoscopy) you may notice spotting of blood in your stool or on the toilet paper. Some abdominal soreness may be present for a day or two, also.  DIET: Your first meal following the procedure should be a light meal and then it is ok to progress to your normal diet. A half-sandwich or bowl of soup is an example of a good first meal. Heavy or fried foods are harder to digest and may make you feel nauseous or bloated. Drink plenty of fluids but you should avoid alcoholic beverages for 24 hours. If you had a esophageal dilation, please see attached instructions for diet.    ACTIVITY: Your care partner should take you home directly after the procedure. You should plan to take it easy, moving slowly for the rest of the day. You can resume normal activity the day after the procedure however YOU SHOULD NOT DRIVE, use power tools, machinery or perform tasks that involve climbing or major physical exertion for 24 hours (because of the sedation medicines used during the test).   SYMPTOMS TO REPORT IMMEDIATELY: A gastroenterologist can be reached at any hour. Please call [phone]  for any of the following symptoms:   Following upper endoscopy (EGD, EUS, ERCP, esophageal dilation) Vomiting of blood or coffee ground material  New, significant abdominal pain  New, significant chest pain or pain under the shoulder blades  Painful or  persistently difficult swallowing  New shortness of breath  Black, tarry-looking or red, bloody stools  FOLLOW UP:  If any biopsies were taken you will be contacted by phone or by letter within the next 1-3 weeks. Call [phone]  if you have not heard about the biopsies in 3 weeks.  Please also call with any specific questions about appointments or follow up tests.  

## 2021-08-11 NOTE — H&P (Signed)
[facility] History and Physical ? ? ?Primary Care Physician:  [name], MD ? ? ?Reason for Procedure:   Melena, gastric AVM's ? ?Plan:    EGD with possible intervention ? ? ? ? ?HPI: Sheri Andrews is a 71 y.o. female is here for EGD for melena with h/o gastric AVM's , plan for evaluation and therapeutic intervention with APC as needed ?The risks and benefits as well as alternatives of endoscopic procedure(s) have been discussed and reviewed. All questions answered. The patient agrees to proceed. ? ?Please refer to office visit note 06/20/21 for additional details ? ? ?Past Medical History:  ?Diagnosis Date  ? Acid reflux disease   ? Allergic rhinitis   ? Anxiety   ? Asthma   ? Barrett esophagus   ? Dyspnea   ? Dysthymia   ? Hypercholesterolemia   ? Hypertension   ? Irritable bowel syndrome   ? Nephrolithiasis   ? Osteopenia   ? Osteoporosis   ? Other chest pain   ? Pulmonary fibrosis ([place])   ? Sjogren's disease ([place])   ? Status post dilation of esophageal narrowing   ? ? ?Past Surgical History:  ?Procedure Laterality Date  ? ESOPHAGOGASTRODUODENOSCOPY (EGD) WITH PROPOFOL N/A 05/02/2019  ? Procedure: ESOPHAGOGASTRODUODENOSCOPY (EGD) WITH PROPOFOL;  Surgeon: Mauri Pole, MD;  Location: [facility];  Service: Endoscopy;  Laterality: N/A;  needing APC available  ? FACIAL COSMETIC SURGERY    ? 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  ? TUBAL LIGATION  1986  ? ? ?Prior to Admission medications   ?Medication Sig Start Date End Date Taking? Authorizing Provider  ?albuterol (VENTOLIN HFA) 108 (90 Base) MCG/ACT inhaler 1-2 puff as needed 04/02/21  Yes Magdalen Spatz, NP  ?amLODipine (NORVASC) 10 MG tablet TAKE 1 TABLET(10 MG) BY MOUTH DAILY ?Patient taking differently: Take 10 mg by mouth every evening. 01/23/21  Yes [name], MD  ?Artificial Tear Solution (TEARS NATURALE OP) Place 1 drop into both eyes See admin instructions. Use 1 drop into both eyes (scheduled) in  the morning & may use 3 times daily if needed for dry /irritated eyes.   Yes [provider]  ?atorvastatin (LIPITOR) 40 MG tablet TAKE 1 TABLET BY MOUTH DAILY. ?Patient taking differently: Take 40 mg by mouth every evening. 07/25/21  Yes Josue Hector, MD  ?azelastine (OPTIVAR) 0.05 % ophthalmic solution Place 1 drop into both eyes daily as needed for allergies. 11/04/18  Yes [provider]  ?Azelastine HCl 0.15 % SOLN USE 2 SPRAYS IN EACH NOSTRIL IN THE MORNING AND AT BEDTIME ?Patient taking differently: Place 2 sprays into both nostrils 2 (two) times daily as needed (allergies.). 12/07/20  Yes Freddi Starr, MD  ?benzonatate (TESSALON PERLES) 100 MG capsule Take 1 capsule (100 mg total) by mouth 3 (three) times daily as needed. 04/09/21  Yes Freddi Starr, MD  ?budesonide-formoterol (SYMBICORT) 160-4.5 MCG/ACT inhaler Inhale 2 puffs into the lungs 2 (two) times daily. ?Patient taking differently: Inhale 2 puffs into the lungs 2 (two) times daily as needed (respiratory issues.). 05/12/21  Yes Parrett, Tammy S, NP  ?clonazePAM (KLONOPIN) 0.25 MG disintegrating tablet DISSOLVE ONE TABLET BY MOUTH EVERY DAY AS NEEDED 01/29/21  Yes [name], MD  ?EPIPEN 2-PAK 0.3 MG/0.3ML SOAJ injection Inject 0.3 mg into the muscle as needed for anaphylaxis.  02/25/14  Yes [provider]  ?escitalopram (LEXAPRO) 10 MG tablet Take 1 tablet (10 mg total)  by mouth daily. ?Patient taking differently: Take 10 mg by mouth at bedtime. 01/21/21  Yes [name], MD  ?estrogen, conjugated,-medroxyprogesterone (PREMPRO) 0.45-1.5 MG per tablet Take 1 tablet by mouth at bedtime.    Yes [provider]  ?fluticasone (FLONASE) 50 MCG/ACT nasal spray Place 2 sprays into both nostrils in the morning and at bedtime. 12/17/18  Yes [provider]  ?ipratropium (ATROVENT) 0.03 % nasal spray Place 2 sprays into both nostrils every 12 (twelve) hours. ?Patient taking differently:  Place 2 sprays into both nostrils 2 (two) times daily as needed (allergies.). 05/29/20  Yes Freddi Starr, MD  ?lansoprazole (PREVACID) 30 MG capsule Take 1 capsule (30 mg total) by mouth 2 (two) times daily before a meal. 06/20/21  Yes Mehak Roskelley, Venia Minks, MD  ?meloxicam (MOBIC) 15 MG tablet Take 15 mg by mouth daily. 08/04/21  Yes [provider]  ?montelukast (SINGULAIR) 10 MG tablet Take 1 tablet (10 mg total) by mouth at bedtime. 01/29/21  Yes [name], MD  ?Olopatadine HCl 0.7 % SOLN Place 1 drop into both eyes daily as needed (allergies.).    Yes [provider]  ?polyethylene glycol (MIRALAX / GLYCOLAX) 17 g packet Take 17 g by mouth in the morning.   Yes [provider]  ?Prenatal Vit-Fe Fumarate-FA (PRENATAL VITAMIN PO) Take 1 tablet by mouth every evening.   Yes [provider]  ?Probiotic Product (PROBIOTIC COLON SUPPORT PO) Take 1 capsule by mouth at bedtime.    Yes [provider]  ?Nintedanib (OFEV) 150 MG CAPS Take 1 capsule (150 mg total) by mouth 2 (two) times daily. 08/07/21   Freddi Starr, MD  ?Spacer/Aero-Holding Chambers (VALVED HOLDING CHAMBER) DEVI SMARTSIG:Inhalation Via Inhaler As Directed 02/11/21   [provider]  ? ? ?Current Facility-Administered Medications  ?Medication Dose Route Frequency Provider Last Rate Last Admin  ? 0.9 %  sodium chloride infusion   Intravenous Continuous Damien Cisar, Venia Minks, MD      ? ? ?Allergies as of 06/20/2021 - Review Complete 06/20/2021  ?Allergen Reaction Noted  ? Bupropion hcl Swelling   ? Doxycycline hyclate Other (See Comments) 02/25/2005  ? Erythromycin ethylsuccinate Other (See Comments) 02/25/2005  ? ? ?Family History  ?Problem Relation Age of Onset  ? Heart disease Mother   ? Diabetes Mother   ? Heart disease Father   ? Diabetes Sister   ? Heart disease Sister   ? Diabetes Brother   ? Heart disease Brother   ? Heart disease Brother   ? Cancer Maternal Uncle   ?     type  unknown  ? Throat cancer Paternal Uncle   ? Colon cancer Neg Hx   ? Stomach cancer Neg Hx   ? Rectal cancer Neg Hx   ? Pancreatic cancer Neg Hx   ? ? ?Social History  ? ?Socioeconomic History  ? Marital status: Married  ?  Spouse name: Not on file  ? Number of children: 2  ? Years of education: Not on file  ? Highest education level: Not on file  ?Occupational History  ? Occupation: retired  ?Tobacco Use  ? Smoking status: Never  ? Smokeless tobacco: Never  ?Vaping Use  ? Vaping Use: Never used  ?Substance and Sexual Activity  ? Alcohol use: Not Currently  ?  Alcohol/week: 2.0 standard drinks  ?  Types: 2 Shots of liquor per week  ? Drug use: No  ? Sexual activity: Yes  ?  Partners: Male  ?Other Topics Concern  ? Not on file  ?Social History Narrative  ? Not on file  ? ?Social Determinants of Health  ? ?Financial Resource Strain: Not on file  ?Food Insecurity: Not on file  ?Transportation Needs: Not on file  ?Physical Activity: Not on file  ?Stress: Not on file  ?Social Connections: Not on file  ?Intimate Partner Violence: Not on file  ? ? ?Review of Systems: ? ?All other review of systems negative except as mentioned in the HPI. ? ?Physical Exam: ?Vital signs in last 24 hours: ?  ?  ?General:   Alert, NAD ?Lungs:  Clear .   ?Heart:  Regular rate and rhythm ?Abdomen:  Soft, nontender and nondistended. ?Neuro/Psych:  Alert and cooperative. Normal mood and affect. A and O x 3 ? ? ?K. Denzil Magnuson , MD ?[phone]  ? ? ?  ?

## 2021-08-11 NOTE — Anesthesia Preprocedure Evaluation (Addendum)
Anesthesia Evaluation  ?Patient identified by MRN, date of birth, ID band ?Patient awake ? ? ? ?Reviewed: ?Allergy & Precautions, NPO status , Patient's Chart, lab work & pertinent test results ? ?Airway ?Mallampati: II ? ?TM Distance: >3 FB ?Neck ROM: Full ? ? ? Dental ?no notable dental hx. ? ?  ?Pulmonary ?asthma ,  ?Pulmonary fibrosis  ?  ?Pulmonary exam normal ?breath sounds clear to auscultation ? ? ? ? ? ? Cardiovascular ?hypertension, Pt. on medications ?Normal cardiovascular exam ?Rhythm:Regular Rate:Normal ? ? ?  ?Neuro/Psych ? Headaches, PSYCHIATRIC DISORDERS Anxiety Depression   ? GI/Hepatic ?Neg liver ROS, GERD  Medicated and Controlled,Irritable bowel syndrome ?  ?Endo/Other  ?negative endocrine ROS ? Renal/GU ?negative Renal ROS  ? ?  ?Musculoskeletal ?negative musculoskeletal ROS ?(+)  ? Abdominal ?  ?Peds ? Hematology ?negative hematology ROS ?(+)   ?Anesthesia Other Findings ?gerd ? Reproductive/Obstetrics ? ?  ? ? ? ? ? ? ? ? ? ? ? ? ? ?  ?  ? ? ? ? ? ? ? ?Anesthesia Physical ?Anesthesia Plan ? ?ASA: 2 ? ?Anesthesia Plan: MAC  ? ?Post-op Pain Management:   ? ?Induction: Intravenous ? ?PONV Risk Score and Plan: 2 and Propofol infusion and Treatment may vary due to age or medical condition ? ?Airway Management Planned: Nasal Cannula ? ?Additional Equipment:  ? ?Intra-op Plan:  ? ?Post-operative Plan:  ? ?Informed Consent: I have reviewed the patients History and Physical, chart, labs and discussed the procedure including the risks, benefits and alternatives for the proposed anesthesia with the patient or authorized representative who has indicated his/her understanding and acceptance.  ? ? ? ?Dental advisory given ? ?Plan Discussed with: CRNA ? ?Anesthesia Plan Comments:   ? ? ? ? ? ? ?Anesthesia Quick Evaluation ? ?

## 2021-08-11 NOTE — Transfer of Care (Signed)
Immediate Anesthesia Transfer of Care Note ? ?Patient: Sheri Andrews ? ?Procedure(s) Performed: ESOPHAGOGASTRODUODENOSCOPY (EGD) WITH PROPOFOL ? ?Patient Location: PACU ? ?Anesthesia Type:MAC ? ?Level of Consciousness: sedated ? ?Airway & Oxygen Therapy: Patient Spontanous Breathing and Patient connected to face mask oxygen ? ?Post-op Assessment: Report given to RN and Post -op Vital signs reviewed and stable ? ?Post vital signs: Reviewed and stable ? ?Last Vitals:  ?Vitals Value Taken Time  ?BP 102/53 08/11/21 1040  ?Temp    ?Pulse 74 08/11/21 1041  ?Resp 13 08/11/21 1041  ?SpO2 100 % 08/11/21 1041  ?Vitals shown include unvalidated device data. ? ?Last Pain:  ?Vitals:  ? 08/11/21 0949  ?TempSrc: Oral  ?PainSc: 0-No pain  ?   ? ?  ? ?Complications: No notable events documented. ?

## 2021-08-13 ENCOUNTER — Encounter (HOSPITAL_COMMUNITY): Payer: Self-pay | Admitting: Gastroenterology

## 2021-08-14 NOTE — Telephone Encounter (Signed)
I am not sure, I have not seen anything in particular so far. ?I received a response from Dr. Zadie Rhine who does not believe there is anything to prohibit taking hydroxychloroquine, would just need close monitoring from ophthalmology standpoint.

## 2021-08-19 NOTE — Progress Notes (Addendum)
? ?Office Visit Note ? ?Patient: Sheri Andrews             ?Date of Birth: 01/01/1951           ?MRN: [id number]             ?PCP: [name], MD ?Referring: [name], * ?Visit Date: 08/20/2021 ? ? ?Subjective:  ?Follow-up (Doing good) ? ? ?History of Present Illness: Sheri Andrews is a 71 y.o. 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 Since our last visit she is doing about the same starting Ofev treatment with Dr. Vaughan Browner. She saw Dr. Wilburn Cornelia completed course of antibiotics and had CT imaging of her head no evidence of swelling involvement to her salivary glands or lymph nodes appears more consistent for preauricular cyst. ? ?Previous HPI ?06/27/21 ?Sheri Andrews is a 71 y.o. female here for evaluation of Sjogren's syndrome. She had recent positive ANA and SSA Abs checked associated with new diagnosis of ILD. She has a previous history of moderate persistent asthma, GAVE, and barrett's esophagus. She had December visit with Dr. Erin Fulling for moderate persistent asthma with obstructive PFT findings and HRCT demonstrated definite changes compared to 2021. So far PFTs have not demonstrated any significant restrictive changes. ?Besides pulmonary issues she has a longstanding history of dry eyes and mouth complaints. She attributed this to her chronic allergic rhinitis and conjunctivitis and frequent sinusitis infections. She has taken allergy shots and frequent antihistamine medication for these problems. She sees ophthalmology recent exam with cataracts and also has some epiretinal membrane changes in both eyes. She mostly treats dry eyes with artificial tears/systane/refresh. ?She estimates dry mouth symptoms are since about 20 years ago. Just drinks a lot of fluid to treat this. She has numerous crowns no particular mouth ulcers or lesions. She has had oral thrush but was attributed to steroid inhaler for asthma. She has  dysphagia reports inability to eat without drinking fluids between solid foods. 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 Silverio Decamp for this problem. She takes PPI long term for heartburn and cough. ?She has mild severity of joint pain increased in her hands within the past few years. Does not have prolonged morning stiffness. Sometimes swelling in bilateral hands not associated with numbness or decreased strength. She notices some redness and nodules in distal finger joints. Intermittently swelling in the feet and ankles nothing persistently. She has some progressive 1st MTP bunions and left lateral foot tendonitis. ?Right cyst in front of right ear this is very chronic she discussed surgical removal with dermatologist a year ago. She sometimes expresses drainage from this during increased swelling. Recently worse with tenderness, swelling, and erythema. ?She denies any lymph node swelling, fevers, skin rashes, or raynaud's symptoms. ?Most recent metabolic panel eGFR 59 no history of problems. Most recent CBC slightly elevated relative lymphocytes 52.6% normal total level. ?  ?Labs reviewed ?ANA 1:40 speckled ?SSA 6.4 ?dsDNA, SSB, Scl-70 neg ?RF neg ?CCP neg ?ANCA neg ?CBC 52.6% lymphs ? ? ?Review of Systems  ?Constitutional:  Positive for fatigue.  ?HENT:  Positive for mouth dryness.   ?Eyes:  Positive for itching.  ?Respiratory:  Positive for shortness of breath.   ?Cardiovascular:  Positive for swelling in legs/feet.  ?Gastrointestinal:  Positive for diarrhea.  ?Endocrine: Positive for excessive thirst and increased urination.  ?Genitourinary:  Negative for difficulty  urinating.  ?Musculoskeletal:  Positive for joint pain, joint pain, morning stiffness and muscle tenderness.  ?Skin:  Negative for rash.  ?Allergic/Immunologic: Positive for susceptible to infections.  ?Neurological:  Negative for numbness.  ?Hematological:  Positive  for bruising/bleeding tendency.  ?Psychiatric/Behavioral:  Negative for sleep disturbance.   ? ?PMFS History:  ?Patient Active Problem List  ? Diagnosis Date Noted  ? Foamy urine 08/29/2021  ? Gastritis and gastroduodenitis   ? Hormone replacement therapy 06/27/2021  ? Abnormal cervical Papanicolaou smear 06/27/2021  ? Allergic rhinitis due to animal (cat) (dog) hair and dander 06/27/2021  ? Anemia 06/27/2021  ? Colitis 06/27/2021  ? Depressive disorder 06/27/2021  ? Insomnia 06/27/2021  ? Sjogren's syndrome ([place]) 06/27/2021  ? 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 High risk medication use 06/27/2021  ? ILD (interstitial lung disease) ([place]) 05/12/2021  ? Left foot pain 01/24/2021  ? Frontal headache 11/19/2020  ? Right epiretinal membrane 01/03/2020  ? Left epiretinal membrane 01/03/2020  ? Nuclear sclerotic cataract of right eye 01/03/2020  ? Nuclear sclerotic cataract of left eye 01/03/2020  ? 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 Anatomical narrow angle of right eye 01/03/2020  ? GAVE (gastric antral vascular ectasia)   ? Melena   ? Iron deficiency anemia due to chronic blood loss   ? Chronic pansinusitis 06/22/2018  ? Nasal turbinate hypertrophy 06/22/2018  ? Asthma 05/25/2018  ? Essential hypertension 06/29/2017  ? Routine general medical examination at a health care facility 05/15/2016  ? Family history of early CAD 03/09/2014  ? Osteopenia 02/01/2012  ? BARRETTS ESOPHAGUS 10/08/2008  ? NEPHROLITHIASIS 04/30/2007  ? OCD (obsessive compulsive disorder) 04/11/2007  ? HYPERCHOLESTEROLEMIA 04/08/2007  ? Gastroesophageal reflux disease 04/08/2007  ? IRRITABLE BOWEL SYNDROME 04/08/2007  ?  ?Past Medical History:  ?Diagnosis Date  ? Acid reflux disease   ? Allergic rhinitis   ? Anxiety   ? Asthma   ? Barrett esophagus   ? Dyspnea   ? Dysthymia   ? Hypercholesterolemia   ? Hypertension   ? Irritable bowel syndrome   ? Nephrolithiasis   ? Osteopenia   ? Osteoporosis   ? Other chest pain   ?  Pulmonary fibrosis ([place])   ? Sjogren's disease ([place])   ? Status post dilation of esophageal narrowing   ?  ?Family History  ?Problem Relation Age of Onset  ? Heart disease Mother   ? Diabetes Mother   ? Heart disease Father   ? Diabetes Sister   ? Heart disease Sister   ? Diabetes Brother   ? Heart disease Brother   ? Heart disease Brother   ? Cancer Maternal Uncle   ?     type unknown  ? Throat cancer Paternal Uncle   ? Colon cancer Neg Hx   ? Stomach cancer Neg Hx   ? Rectal cancer Neg Hx   ? Pancreatic cancer Neg Hx   ? ?Past Surgical History:  ?Procedure Laterality Date  ? ESOPHAGOGASTRODUODENOSCOPY (EGD) WITH PROPOFOL N/A 05/02/2019  ? Procedure: ESOPHAGOGASTRODUODENOSCOPY (EGD) WITH PROPOFOL;  Surgeon: Mauri Pole, MD;  Location: [facility];  Service: Endoscopy;  Laterality: N/A;  needing APC available  ? ESOPHAGOGASTRODUODENOSCOPY (EGD) WITH PROPOFOL N/A 08/11/2021  ? Procedure: ESOPHAGOGASTRODUODENOSCOPY (EGD) WITH PROPOFOL;  Surgeon: Mauri Pole, MD;  Location: [facility];  Service: Gastroenterology;  Laterality: N/A;  ? FACIAL COSMETIC SURGERY    ? 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  ?  TUBAL LIGATION  1986  ? ?Social History  ? ?Social History Narrative  ? Not on file  ? ?Immunization History  ?Administered Date(s) Administered  ? Fluad Quad(high Dose 65+) 01/21/2021  ? Influenza Split 02/01/2012  ? Influenza Whole 01/31/2009  ? Influenza, High Dose Seasonal PF 02/17/2016, 01/25/2017, 01/23/2019, 01/30/2020, 02/11/2021  ? Influenza,inj,Quad PF,6+ Mos 03/28/2015  ? Influenza,inj,quad, With Preservative 01/19/2019  ? Influenza-Unspecified 02/01/2017  ? PFIZER(Purple Top)SARS-COV-2 Vaccination 05/25/2019, 06/21/2019, 02/27/2020  ? Pneumococcal Conjugate-13 03/28/2015  ? Pneumococcal Polysaccharide-23 11/11/2015, 05/14/2016, 01/30/2020, 02/11/2021  ? Tdap 08/17/2011  ? Zoster Recombinat (Shingrix) 07/14/2017, 11/11/2017  ?  ? ?Objective: ?Vital Signs: BP 105/69 (BP  Location: Left Arm, Patient Position: Sitting, Cuff Size: Normal)   Pulse 82   Resp 15   Ht 5' 3.5" (1.613 m)   Wt 144 lb (65.3 kg)   BMI 25.11 kg/m?   ? ?Physical Exam ?Cardiovascular:  ?   Rate and Rhythm: Normal rate

## 2021-08-20 ENCOUNTER — Encounter: Payer: Self-pay | Admitting: Internal Medicine

## 2021-08-20 ENCOUNTER — Ambulatory Visit (INDEPENDENT_AMBULATORY_CARE_PROVIDER_SITE_OTHER): Payer: PPO | Admitting: Internal Medicine

## 2021-08-20 ENCOUNTER — Encounter: Payer: Self-pay | Admitting: Pulmonary Disease

## 2021-08-20 VITALS — BP 105/69 | HR 82 | Resp 15 | Ht 63.5 in | Wt 144.0 lb

## 2021-08-20 DIAGNOSIS — J849 Interstitial pulmonary disease, unspecified: Secondary | ICD-10-CM | POA: Diagnosis not present

## 2021-08-20 DIAGNOSIS — Z79899 Other long term (current) drug therapy: Secondary | ICD-10-CM | POA: Diagnosis not present

## 2021-08-20 DIAGNOSIS — M3501 Sicca syndrome with keratoconjunctivitis: Secondary | ICD-10-CM

## 2021-08-20 DIAGNOSIS — Q181 Preauricular sinus and cyst: Secondary | ICD-10-CM

## 2021-08-20 MED ORDER — HYDROXYCHLOROQUINE SULFATE 200 MG PO TABS: 200.0000 mg | ORAL_TABLET | Freq: Every day | ORAL | 3 refills | Status: DC

## 2021-08-20 NOTE — Patient Instructions (Signed)
Hydroxychloroquine Tablets ?What is this medication? ?HYDROXYCHLOROQUINE (hye drox ee KLOR oh kwin) treats autoimmune conditions, such as rheumatoid arthritis and lupus. It works by slowing down an overactive immune system. It may also be used to prevent and treat malaria. It works by killing the parasite that causes malaria. It belongs to a group of medications called DMARDs. ?This medicine may be used for other purposes; ask your health care provider or pharmacist if you have questions. ?COMMON BRAND NAME(S): Plaquenil, Quineprox ?What should I tell my care team before I take this medication? ?They need to know if you have any of these conditions: ?Diabetes ?Eye disease, vision problems ?G6PD deficiency ?Heart disease ?History of irregular heartbeat ?If you often drink alcohol ?Kidney disease ?Liver disease ?Porphyria ?Psoriasis ?An unusual or allergic reaction to chloroquine, hydroxychloroquine, other medications, foods, dyes, or preservatives ?Pregnant or trying to get pregnant ?Breast-feeding ?How should I use this medication? ?Take this medication by mouth with a glass of water. Take it as directed on the prescription label. Do not cut, crush or chew this medication. Swallow the tablets whole. Take it with food. Do not take it more than directed. Take all of this medication unless your care team tells you to stop it early. Keep taking it even if you think you are better. ?Take products with antacids in them at a different time of day than this medication. Take this medication 4 hours before or 4 hours after antacids. Talk to your care team if you have questions. ?Talk to your care team about the use of this medication in children. While this medication may be prescribed for selected conditions, precautions do apply. ?Overdosage: If you think you have taken too much of this medicine contact a poison control center or emergency room at once. ?NOTE: This medicine is only for you. Do not share this medicine with  others. ?What if I miss a dose? ?If you miss a dose, take it as soon as you can. If it is almost time for your next dose, take only that dose. Do not take double or extra doses. ?What may interact with this medication? ?Do not take this medication with any of the following: ?Cisapride ?Dronedarone ?Pimozide ?Thioridazine ?This medication may also interact with the following: ?Ampicillin ?Antacids ?Cimetidine ?Cyclosporine ?Digoxin ?Kaolin ?Medications for diabetes, like insulin, glipizide, glyburide ?Medications for seizures like carbamazepine, phenobarbital, phenytoin ?Mefloquine ?Methotrexate ?Other medications that prolong the QT interval (cause an abnormal heart rhythm) ?Praziquantel ?This list may not describe all possible interactions. Give your health care provider a list of all the medicines, herbs, non-prescription drugs, or dietary supplements you use. Also tell them if you smoke, drink alcohol, or use illegal drugs. Some items may interact with your medicine. ?What should I watch for while using this medication? ?Visit your care team for regular checks on your progress. Tell your care team if your symptoms do not start to get better or if they get worse. ?You may need blood work done while you are taking this medication. If you take other medications that can affect heart rhythm, you may need more testing. Talk to your care team if you have questions. ?Your vision may be tested before and during use of this medication. Tell your care team right away if you have any change in your eyesight. ?This medication may cause serious skin reactions. They can happen weeks to months after starting the medication. Contact your care team right away if you notice fevers or flu-like symptoms with a rash. The   rash may be red or purple and then turn into blisters or peeling of the skin. Or, you might notice a red rash with swelling of the face, lips or lymph nodes in your neck or under your arms. ?If you or your family  notice any changes in your behavior, such as new or worsening depression, thoughts of harming yourself, anxiety, or other unusual or disturbing thoughts, or memory loss, call your care team right away. ?What side effects may I notice from receiving this medication? ?Side effects that you should report to your care team as soon as possible: ?Allergic reactions--skin rash, itching, hives, swelling of the face, lips, tongue, or throat ?Aplastic anemia--unusual weakness or fatigue, dizziness, headache, trouble breathing, increased bleeding or bruising ?Change in vision ?Heart rhythm changes--fast or irregular heartbeat, dizziness, feeling faint or lightheaded, chest pain, trouble breathing ?Infection--fever, chills, cough, or sore throat ?Low blood sugar (hypoglycemia)--tremors or shaking, anxiety, sweating, cold or clammy skin, confusion, dizziness, rapid heartbeat ?Muscle injury--unusual weakness or fatigue, muscle pain, dark yellow or brown urine, decrease in amount of urine ?Pain, tingling, or numbness in the hands or feet ?Rash, fever, and swollen lymph nodes ?Redness, blistering, peeling, or loosening of the skin, including inside the mouth ?Thoughts of suicide or self-harm, worsening mood, or feelings of depression ?Unusual bruising or bleeding ?Side effects that usually do not require medical attention (report to your care team if they continue or are bothersome): ?Diarrhea ?Headache ?Nausea ?Stomach pain ?Vomiting ?This list may not describe all possible side effects. Call your doctor for medical advice about side effects. You may report side effects to FDA at 1-800-FDA-1088. ?Where should I keep my medication? ?Keep out of the reach of children and pets. ?Store at room temperature up to 30 degrees C (86 degrees F). Protect from light. Get rid of any unused medication after the expiration date. ?To get rid of medications that are no longer needed or have expired: ?Take the medication to a medication take-back  program. Check with your pharmacy or law enforcement to find a location. ?If you cannot return the medication, check the label or package insert to see if the medication should be thrown out in the garbage or flushed down the toilet. If you are not sure, ask your care team. If it is safe to put it in the trash, empty the medication out of the container. Mix the medication with cat litter, dirt, coffee grounds, or other unwanted substance. Seal the mixture in a bag or container. Put it in the trash. ?NOTE: This sheet is a summary. It may not cover all possible information. If you have questions about this medicine, talk to your doctor, pharmacist, or health care provider. ?? 2023 Elsevier/Gold Standard (2020-08-22 00:00:00) ? ?

## 2021-08-21 NOTE — Telephone Encounter (Signed)
Dr. Dewald, please see mychart message sent by pt and advise. 

## 2021-08-26 ENCOUNTER — Encounter: Payer: Self-pay | Admitting: Pulmonary Disease

## 2021-08-27 ENCOUNTER — Telehealth: Payer: Self-pay | Admitting: Internal Medicine

## 2021-08-27 ENCOUNTER — Encounter: Payer: Self-pay | Admitting: Internal Medicine

## 2021-08-27 DIAGNOSIS — R7303 Prediabetes: Secondary | ICD-10-CM

## 2021-08-27 DIAGNOSIS — R82998 Other abnormal findings in urine: Secondary | ICD-10-CM

## 2021-08-27 DIAGNOSIS — E7800 Pure hypercholesterolemia, unspecified: Secondary | ICD-10-CM

## 2021-08-27 NOTE — Telephone Encounter (Signed)
Notes were forwarded to doctor that patient requested. Patient verbalized understanding. Nothing further needed at this time!  ?

## 2021-08-27 NOTE — Telephone Encounter (Signed)
Patient called the office stating she started taking hydroxychloroquine about a week ago. Patient states her urine is now foamy and has been every time she has gone for at least a week. Patient requests a call back stating she does not know if this is a side effect or maybe a kidney infection. Patient requests a call back as soon as possible.  ?

## 2021-08-27 NOTE — Addendum Note (Signed)
Addended by: Pricilla Holm A on: 08/27/2021 12:22 PM ? ? Modules accepted: Orders ? ?

## 2021-08-28 ENCOUNTER — Other Ambulatory Visit (INDEPENDENT_AMBULATORY_CARE_PROVIDER_SITE_OTHER): Payer: PPO

## 2021-08-28 DIAGNOSIS — R7303 Prediabetes: Secondary | ICD-10-CM | POA: Diagnosis not present

## 2021-08-28 DIAGNOSIS — R82998 Other abnormal findings in urine: Secondary | ICD-10-CM | POA: Diagnosis not present

## 2021-08-28 DIAGNOSIS — E7800 Pure hypercholesterolemia, unspecified: Secondary | ICD-10-CM

## 2021-08-28 LAB — LIPID PANEL
Cholesterol: 129 mg/dL (ref 0–200)
HDL: 40.7 mg/dL (ref >39.00)
LDL Cholesterol: 53 mg/dL (ref 0–99)
NonHDL: 87.92
Total CHOL/HDL Ratio: 3
Triglycerides: 176 mg/dL — ABNORMAL HIGH (ref 0.0–149.0)
VLDL: 35.2 mg/dL (ref 0.0–40.0)

## 2021-08-28 LAB — URINALYSIS, ROUTINE W REFLEX MICROSCOPIC
Bilirubin Urine: NEGATIVE
Ketones, ur: NEGATIVE
Leukocytes,Ua: NEGATIVE
Nitrite: NEGATIVE
Specific Gravity, Urine: 1.02 (ref 1.000–1.030)
Total Protein, Urine: 100 — AB
Urine Glucose: NEGATIVE
Urobilinogen, UA: 0.2 (ref 0.0–1.0)
pH: 6 (ref 5.0–8.0)

## 2021-08-28 LAB — HEMOGLOBIN A1C: Hgb A1c MFr Bld: 5.9 % (ref 4.6–6.5)

## 2021-08-28 LAB — MICROALBUMIN / CREATININE URINE RATIO
Creatinine,U: 148.2 mg/dL
Microalb Creat Ratio: 43.7 mg/g — ABNORMAL HIGH (ref 0.0–30.0)
Microalb, Ur: 64.8 mg/dL — ABNORMAL HIGH (ref 0.0–1.9)

## 2021-08-28 NOTE — Telephone Encounter (Signed)
That is not a common side effect of hydroxychloroquine. But rarely this medicine could cause increased protein in the urine that would give a foamy appearance or increased frequency. The best test to check this would be the urine protein/creatinine ratio. We can order this if she would be able to provide a urine sample.

## 2021-08-28 NOTE — Telephone Encounter (Signed)
Patient advised per Dr. Benjamine Mola, that is not a common side effect of hydroxychloroquine. But rarely this medicine could cause increased protein in the urine that would give a foamy appearance or increased frequency. The best test to check this would be the urine protein/creatinine ratio. We can order this if she would be able to provide a urine sample. Patient states she is due to have labs done with PCP and will see if they can do that test as well. If not she will call the office and come have the test done here.  ?

## 2021-08-29 ENCOUNTER — Other Ambulatory Visit: Payer: Self-pay

## 2021-08-29 ENCOUNTER — Encounter: Payer: Self-pay | Admitting: Internal Medicine

## 2021-08-29 ENCOUNTER — Ambulatory Visit (INDEPENDENT_AMBULATORY_CARE_PROVIDER_SITE_OTHER): Payer: PPO | Admitting: Internal Medicine

## 2021-08-29 ENCOUNTER — Emergency Department (HOSPITAL_BASED_OUTPATIENT_CLINIC_OR_DEPARTMENT_OTHER): Payer: PPO | Admitting: Radiology

## 2021-08-29 ENCOUNTER — Encounter (HOSPITAL_BASED_OUTPATIENT_CLINIC_OR_DEPARTMENT_OTHER): Payer: Self-pay

## 2021-08-29 ENCOUNTER — Emergency Department (HOSPITAL_BASED_OUTPATIENT_CLINIC_OR_DEPARTMENT_OTHER)
Admission: EM | Admit: 2021-08-29 | Discharge: 2021-08-29 | Disposition: A | Discharge status: Home / Self Care | Payer: PPO | Attending: Emergency Medicine | Admitting: Emergency Medicine

## 2021-08-29 ENCOUNTER — Emergency Department (HOSPITAL_BASED_OUTPATIENT_CLINIC_OR_DEPARTMENT_OTHER): Payer: PPO

## 2021-08-29 DIAGNOSIS — J3089 Other allergic rhinitis: Secondary | ICD-10-CM | POA: Diagnosis not present

## 2021-08-29 DIAGNOSIS — I10 Essential (primary) hypertension: Secondary | ICD-10-CM | POA: Diagnosis not present

## 2021-08-29 DIAGNOSIS — R0789 Other chest pain: Secondary | ICD-10-CM | POA: Diagnosis not present

## 2021-08-29 DIAGNOSIS — N200 Calculus of kidney: Secondary | ICD-10-CM | POA: Diagnosis not present

## 2021-08-29 DIAGNOSIS — J849 Interstitial pulmonary disease, unspecified: Secondary | ICD-10-CM

## 2021-08-29 DIAGNOSIS — D500 Iron deficiency anemia secondary to blood loss (chronic): Secondary | ICD-10-CM

## 2021-08-29 DIAGNOSIS — R1013 Epigastric pain: Secondary | ICD-10-CM | POA: Diagnosis not present

## 2021-08-29 DIAGNOSIS — F32A Depression, unspecified: Secondary | ICD-10-CM

## 2021-08-29 DIAGNOSIS — I959 Hypotension, unspecified: Secondary | ICD-10-CM | POA: Diagnosis not present

## 2021-08-29 DIAGNOSIS — F419 Anxiety disorder, unspecified: Secondary | ICD-10-CM | POA: Diagnosis not present

## 2021-08-29 DIAGNOSIS — R079 Chest pain, unspecified: Secondary | ICD-10-CM | POA: Diagnosis not present

## 2021-08-29 DIAGNOSIS — R82998 Other abnormal findings in urine: Secondary | ICD-10-CM | POA: Diagnosis not present

## 2021-08-29 DIAGNOSIS — J3081 Allergic rhinitis due to animal (cat) (dog) hair and dander: Secondary | ICD-10-CM | POA: Diagnosis not present

## 2021-08-29 DIAGNOSIS — R202 Paresthesia of skin: Secondary | ICD-10-CM | POA: Diagnosis not present

## 2021-08-29 DIAGNOSIS — R110 Nausea: Secondary | ICD-10-CM | POA: Diagnosis not present

## 2021-08-29 DIAGNOSIS — R0602 Shortness of breath: Secondary | ICD-10-CM | POA: Diagnosis not present

## 2021-08-29 DIAGNOSIS — K449 Diaphragmatic hernia without obstruction or gangrene: Secondary | ICD-10-CM | POA: Insufficient documentation

## 2021-08-29 DIAGNOSIS — Z79899 Other long term (current) drug therapy: Secondary | ICD-10-CM | POA: Diagnosis not present

## 2021-08-29 DIAGNOSIS — M549 Dorsalgia, unspecified: Secondary | ICD-10-CM | POA: Diagnosis not present

## 2021-08-29 LAB — COMPREHENSIVE METABOLIC PANEL
ALT: 37 U/L (ref 0–44)
AST: 56 U/L — ABNORMAL HIGH (ref 15–41)
Albumin: 4.2 g/dL (ref 3.5–5.0)
Alkaline Phosphatase: 44 U/L (ref 38–126)
Anion gap: 14 (ref 5–15)
BUN: 18 mg/dL (ref 8–23)
CO2: 19 mmol/L — ABNORMAL LOW (ref 22–32)
Calcium: 9.9 mg/dL (ref 8.9–10.3)
Chloride: 106 mmol/L (ref 98–111)
Creatinine, Ser: 0.9 mg/dL (ref 0.44–1.00)
GFR, Estimated: >60 mL/min (ref >60)
Glucose, Bld: 107 mg/dL — ABNORMAL HIGH (ref 70–99)
Potassium: 3.4 mmol/L — ABNORMAL LOW (ref 3.5–5.1)
Sodium: 139 mmol/L (ref 135–145)
Total Bilirubin: 0.7 mg/dL (ref 0.3–1.2)
Total Protein: 7.2 g/dL (ref 6.5–8.1)

## 2021-08-29 LAB — CBC
HCT: 38 % (ref 36.0–46.0)
Hemoglobin: 13.2 g/dL (ref 12.0–15.0)
MCH: 30.3 pg (ref 26.0–34.0)
MCHC: 34.7 g/dL (ref 30.0–36.0)
MCV: 87.2 fL (ref 80.0–100.0)
Platelets: 224 10*3/uL (ref 150–400)
RBC: 4.36 MIL/uL (ref 3.87–5.11)
RDW: 13.3 % (ref 11.5–15.5)
WBC: 7.7 10*3/uL (ref 4.0–10.5)
nRBC: 0 % (ref 0.0–0.2)

## 2021-08-29 LAB — LIPASE, BLOOD: Lipase: 47 U/L (ref 11–51)

## 2021-08-29 LAB — TROPONIN I (HIGH SENSITIVITY)
Troponin I (High Sensitivity): 3 ng/L (ref <18)
Troponin I (High Sensitivity): 2 ng/L (ref <18)

## 2021-08-29 MED ORDER — IOHEXOL 350 MG/ML SOLN
100.0000 mL | Freq: Once | INTRAVENOUS | Status: AC | PRN
  Administered 2021-08-29: 100 mL via INTRAVENOUS

## 2021-08-29 NOTE — ED Triage Notes (Signed)
Pt BIB [facility]. EMS report pt had sudden onset epigastric pain and nausea. EMS noted hyperventilation on their arrival along with carpal-pedal spasms. Pt now stating pain is in her chest and back. Pt describes the pain as sharp and reports ShOB.  ?

## 2021-08-29 NOTE — Patient Instructions (Signed)
We will be back in touch about the labs. ?

## 2021-08-29 NOTE — Assessment & Plan Note (Signed)
Multiple new diagnoses are impacting her mental health and she is using coping skills to good effect. Still taking lexapro 10 mg daily and using clonazepam 0.25 mg daily prn. No change needed today. Mild worsening but coping well.  ?

## 2021-08-29 NOTE — Assessment & Plan Note (Signed)
She notified us prior to visit and we did collect U/A as well as microalbumin to creatinine ratio. Depending on if protein present/amount present we may need further evaluation. Both ofev and plaquenil have relatively rare side effects of proteinuria. Adjust as needed new problem could be concerning. ?

## 2021-08-29 NOTE — Assessment & Plan Note (Signed)
We did order and check CBC prior to visit results not available to share with her at visit but will follow up appropriately.  ?

## 2021-08-29 NOTE — ED Provider Notes (Signed)
?[facility] ?Provider Note ? ? ?CSN: [id number] ?Arrival date & time: 08/29/21  1233 ? ?  ? ?History ? ?Chief Complaint  ?Patient presents with  ? Abdominal Pain  ? ? ?Sheri Andrews is a 71 y.o. female who presents to the emergency department with sudden onset epigastric pain and nausea about 1030 this morning.  Patient states that her pain is now mostly in her chest and radiates to her back.  She is also felt she has tremors in her hands and her feet, was noted to be hyperventilating upon arrival.  She describes the pain as sharp with associated shortness of breath.  Was given 324 mg aspirin by EMS.  Patient states that she has a hiatal hernia that often gives her pain, but it has never lasted this long. ? ? ?Abdominal Pain ?Associated symptoms: chest pain and shortness of breath   ?Associated symptoms: no cough, no diarrhea, no fever, no nausea and no vomiting   ? ?  ? ?Home Medications ?Prior to Admission medications   ?Medication Sig Start Date End Date Taking? Authorizing Provider  ?albuterol (VENTOLIN HFA) 108 (90 Base) MCG/ACT inhaler 1-2 puff as needed 04/02/21   Magdalen Spatz, NP  ?amLODipine (NORVASC) 10 MG tablet TAKE 1 TABLET(10 MG) BY MOUTH DAILY ?Patient taking differently: Take 10 mg by mouth every evening. 01/23/21   [name], MD  ?Artificial Tear Solution (TEARS NATURALE OP) Place 1 drop into both eyes See admin instructions. Use 1 drop into both eyes (scheduled) in the morning & may use 3 times daily if needed for dry /irritated eyes.    [provider]  ?atorvastatin (LIPITOR) 40 MG tablet TAKE 1 TABLET BY MOUTH DAILY. ?Patient taking differently: Take 40 mg by mouth every evening. 07/25/21   Josue Hector, MD  ?azelastine (OPTIVAR) 0.05 % ophthalmic solution Place 1 drop into both eyes daily as needed for allergies. 11/04/18   [provider]  ?Azelastine HCl 0.15 % SOLN USE 2 SPRAYS IN EACH NOSTRIL IN THE MORNING AND AT BEDTIME ?Patient  taking differently: Place 2 sprays into both nostrils 2 (two) times daily as needed (allergies.). 12/07/20   Freddi Starr, MD  ?benzonatate (TESSALON PERLES) 100 MG capsule Take 1 capsule (100 mg total) by mouth 3 (three) times daily as needed. 04/09/21   Freddi Starr, MD  ?budesonide-formoterol (SYMBICORT) 160-4.5 MCG/ACT inhaler Inhale 2 puffs into the lungs 2 (two) times daily. ?Patient taking differently: Inhale 2 puffs into the lungs 2 (two) times daily as needed (respiratory issues.). 05/12/21   Parrett, Fonnie Mu, NP  ?clonazePAM (KLONOPIN) 0.25 MG disintegrating tablet DISSOLVE ONE TABLET BY MOUTH EVERY DAY AS NEEDED 01/29/21   [name], MD  ?EPIPEN 2-PAK 0.3 MG/0.3ML SOAJ injection Inject 0.3 mg into the muscle as needed for anaphylaxis.  02/25/14   [provider]  ?escitalopram (LEXAPRO) 10 MG tablet Take 1 tablet (10 mg total) by mouth daily. ?Patient taking differently: Take 10 mg by mouth at bedtime. 01/21/21   [name], MD  ?estrogen, conjugated,-medroxyprogesterone (PREMPRO) 0.45-1.5 MG per tablet Take 1 tablet by mouth at bedtime.     [provider]  ?fluticasone (FLONASE) 50 MCG/ACT nasal spray Place 2 sprays into both nostrils in the morning and at bedtime. 12/17/18   [provider]  ?hydroxychloroquine (PLAQUENIL) 200 MG tablet Take 1 tablet (200 mg total) by mouth daily. 08/20/21   Collier Salina, MD  ?ipratropium (ATROVENT) 0.03 %  nasal spray Place 2 sprays into both nostrils every 12 (twelve) hours. ?Patient taking differently: Place 2 sprays into both nostrils 2 (two) times daily as needed (allergies.). 05/29/20   Freddi Starr, MD  ?lansoprazole (PREVACID) 30 MG capsule Take 1 capsule (30 mg total) by mouth 2 (two) times daily before a meal. 06/20/21   Nandigam, Venia Minks, MD  ?meloxicam (MOBIC) 15 MG tablet Take 15 mg by mouth daily. 08/04/21   [provider]  ?montelukast (SINGULAIR) 10 MG tablet Take 1 tablet (10 mg  total) by mouth at bedtime. 01/29/21   [name], MD  ?Nintedanib (OFEV) 150 MG CAPS Take 1 capsule (150 mg total) by mouth 2 (two) times daily. 08/07/21   Freddi Starr, MD  ?Olopatadine HCl 0.7 % SOLN Place 1 drop into both eyes daily as needed (allergies.).     [provider]  ?polyethylene glycol (MIRALAX / GLYCOLAX) 17 g packet Take 17 g by mouth in the morning.    [provider]  ?Prenatal Vit-Fe Fumarate-FA (PRENATAL VITAMIN PO) Take 1 tablet by mouth every evening.    [provider]  ?Probiotic Product (PROBIOTIC COLON SUPPORT PO) Take 1 capsule by mouth at bedtime.     [provider]  ?Spacer/Aero-Holding Chambers (VALVED HOLDING CHAMBER) DEVI SMARTSIG:Inhalation Via Inhaler As Directed 02/11/21   [provider]  ?sucralfate (CARAFATE) 1 g tablet Take 1 tablet (1 g total) by mouth 4 (four) times daily -  with meals and at bedtime. 08/11/21 08/11/22  Mauri Pole, MD  ?   ? ?Allergies    ?Amoxicillin, Wellbutrin [bupropion], Doxycycline hyclate, and Erythromycin ethylsuccinate   ? ?Review of Systems   ?Review of Systems  ?Constitutional:  Negative for fever.  ?Respiratory:  Positive for shortness of breath. Negative for cough.   ?Cardiovascular:  Positive for chest pain.  ?Gastrointestinal:  Positive for abdominal pain. Negative for diarrhea, nausea and vomiting.  ?Genitourinary:  Negative for flank pain.  ?Musculoskeletal:  Positive for back pain.  ?Neurological:  Negative for weakness, light-headedness, numbness and headaches.  ?All other systems reviewed and are negative. ? ?Physical Exam ?Updated Vital Signs ?BP 134/70 (BP Location: Right Arm)   Pulse 74   Temp 98.1 ?F (36.7 ?C) (Oral)   Resp 14   Ht 5' 3.5" (1.613 m)   Wt 64.9 kg   SpO2 98%   BMI 24.93 kg/m?  ?Physical Exam ?Vitals and nursing note reviewed.  ?Constitutional:   ?   Appearance: Normal appearance.  ?   Comments: Pt appears anxious  ?HENT:  ?   Head:  Normocephalic and atraumatic.  ?Eyes:  ?   Conjunctiva/sclera: Conjunctivae normal.  ?Cardiovascular:  ?   Rate and Rhythm: Normal rate and regular rhythm.  ?   Pulses:     ?     Radial pulses are 2+ on the right side and 2+ on the left side.  ?     Posterior tibial pulses are 2+ on the right side and 2+ on the left side.  ?Pulmonary:  ?   Effort: Pulmonary effort is normal. No respiratory distress.  ?   Breath sounds: Normal breath sounds.  ?Abdominal:  ?   General: There is no distension.  ?   Palpations: Abdomen is soft. There is no pulsatile mass.  ?   Tenderness: There is no abdominal tenderness.  ?Musculoskeletal:  ?   Right lower leg: No edema.  ?   Left lower leg: No edema.  ?  Skin: ?   General: Skin is warm and dry.  ?Neurological:  ?   General: No focal deficit present.  ?   Mental Status: She is alert.  ? ? ?ED Results / Procedures / Treatments   ?Labs ?(all labs ordered are listed, but only abnormal results are displayed) ?Labs Reviewed  ?COMPREHENSIVE METABOLIC PANEL - Abnormal; Notable for the following components:  ?    Result Value  ? Potassium 3.4 (*)   ? CO2 19 (*)   ? Glucose, Bld 107 (*)   ? AST 56 (*)   ? All other components within normal limits  ?CBC  ?LIPASE, BLOOD  ?TROPONIN I (HIGH SENSITIVITY)  ?TROPONIN I (HIGH SENSITIVITY)  ? ? ?EKG ?EKG Interpretation ? ?Date/Time:  Friday Aug 29 2021 13:08:05 EDT ?Ventricular Rate:  80 ?PR Interval:    ?QRS Duration: 70 ?QT Interval:  378 ?QTC Calculation: 435 ?R Axis:   66 ?Text Interpretation: Normal sinus rhythm Septal infarct (cited on or before 29-May-2021) Abnormal ECG When compared with ECG of 29-May-2021 12:20, Poor data quality Confirmed by Aletta Edouard [phone]) on 08/29/2021 1:12:24 PM ? ?Radiology ?DG Chest 2 View ? ?Result Date: 08/29/2021 ?CLINICAL DATA:  Chest pain and shortness of breath. EXAM: CHEST - 2 VIEW COMPARISON:  02/24/2021 FINDINGS: Lungs are adequately inflated and otherwise clear. Cardiomediastinal silhouette and remainder of  the exam is unchanged. IMPRESSION: No active cardiopulmonary disease. Electronically Signed   By: Marin Olp M.D.   On: 08/29/2021 13:30  ? ?CT Angio Chest/Abd/Pel for Dissection W and/or Wo Contrast ? ?Result Date:

## 2021-08-29 NOTE — Progress Notes (Signed)
? ?  Subjective:  ? ?Patient ID: Sheri Andrews, female    DOB: [birth year], 71 y.o.   MRN: [id number] ? ?HPI ?Coming in for follow up. New foamy urine in last week. Has started on ofev and plaquenil since our last visit.  ? ?Review of Systems  ?Constitutional: Negative.   ?HENT: Negative.    ?Eyes: Negative.   ?Respiratory:  Negative for cough, chest tightness and shortness of breath.   ?Cardiovascular:  Negative for chest pain, palpitations and leg swelling.  ?Gastrointestinal:  Negative for abdominal distention, abdominal pain, constipation, diarrhea, nausea and vomiting.  ?Genitourinary:   ?     Foamy urine  ?Musculoskeletal: Negative.   ?Skin: Negative.   ?Neurological: Negative.   ?Psychiatric/Behavioral: Negative.    ? ?Objective:  ?Physical Exam ?Constitutional:   ?   Appearance: She is well-developed.  ?HENT:  ?   Head: Normocephalic and atraumatic.  ?Cardiovascular:  ?   Rate and Rhythm: Normal rate and regular rhythm.  ?Pulmonary:  ?   Effort: Pulmonary effort is normal. No respiratory distress.  ?   Breath sounds: Normal breath sounds. No wheezing or rales.  ?Abdominal:  ?   General: Bowel sounds are normal. There is no distension.  ?   Palpations: Abdomen is soft.  ?   Tenderness: There is no abdominal tenderness. There is no rebound.  ?Musculoskeletal:  ?   Cervical back: Normal range of motion.  ?Skin: ?   General: Skin is warm and dry.  ?Neurological:  ?   Mental Status: She is alert and oriented to person, place, and time.  ?   Coordination: Coordination normal.  ? ? ?Vitals:  ? 08/29/21 0943  ?BP: 124/80  ?Pulse: 93  ?Resp: 18  ?SpO2: 99%  ?Weight: 143 lb 12.8 oz (65.2 kg)  ?Height: 5' 3.5" (1.613 m)  ? ? ?Assessment & Plan:  ? ?

## 2021-08-29 NOTE — Assessment & Plan Note (Signed)
We did discuss that ofev can cause proteinuria which she has clinical signs of. Waiting on diagnostic workup to confirm or rule out. ?

## 2021-08-29 NOTE — Discharge Instructions (Addendum)
You were seen in the emergency department for chest pain. ? ?As we discussed we got lab work and imaging looking at your heart, lungs, electrolytes, and kidney function.  ? ?Your troponin value, which looks at the demand on your heart, was normal. ?The imaging of your chest and abdomen showed a small kidney stone, but otherwise no abnormalities. ? ?Continue your prilosec medicine and take anti-inflammatories as needed. I've send your primary doctor a message to make them aware. ? ?Continue to monitor how you're doing and return to the ER for new or worsening symptoms. ?

## 2021-09-01 ENCOUNTER — Encounter: Payer: Self-pay | Admitting: Internal Medicine

## 2021-09-01 NOTE — Telephone Encounter (Signed)
I spoke with Sheri Andrews urinalysis does not look typical to be hydroxychloroquine side effect with hematuria and mucous calcium oxalate crystals as well as mild proteinuria.  She reports previous history of nephrolithiasis has apparently had persistent nonobstructing renal stone could explain the calcium oxalate crystals present.  PCP office already plans to recheck these results in a month.  I recommended she could try pausing the hydroxychloroquine for at least the next 1 week and see if there is a difference in the observed change with foamy urine appearance but think this is less likely.

## 2021-09-02 ENCOUNTER — Encounter: Payer: Self-pay | Admitting: Pulmonary Disease

## 2021-09-02 DIAGNOSIS — Z5181 Encounter for therapeutic drug level monitoring: Secondary | ICD-10-CM

## 2021-09-02 DIAGNOSIS — R809 Proteinuria, unspecified: Secondary | ICD-10-CM

## 2021-09-03 NOTE — Telephone Encounter (Signed)
Mychart message sent by pt: ?Sheri Andrews [facility] (supporting Freddi Starr, MD) 21 hours ago (12:46 PM)  ? ?I have been having foamy urine for about 10 day. Urine tests shows protein in my urine.  ?Should I be concerned or do we watch this? ?I read Ofev could cause this. ?Thanks, ?Sheri Andrews?Sheri Andrews ? ? ? ? ?Looked at the result note from pt's PCP about the urinalysis and saw where she said to watch and in about a month see if there was still protein showing up. ? ? ?Dr. Erin Fulling, please advise on this for pt. ? ? ?

## 2021-09-04 NOTE — Telephone Encounter (Signed)
Sheri Andrews  I have reached out to the [facility] for BI to get more information. Copied you ion the email. Proteinura is listed but then we are not actively screening for it. So hopefully the [facility] can get [place] some data  Thanks    SIGNATURE    Dr. Brand Males, M.D., F.C.C.P,  Pulmonary and Critical Care Medicine Staff Physician, [place] Director - Interstitial Lung Disease  Program  Medical Director - [place] [place] at [place], [place], [address]  NPI Number:  NPI #[id number] [facility] Number: [id number]  Pager: [phone], If no answer  -[place] or [place] [phone] Telephone (clinical office): [phone] Telephone (research): [phone]  11:36 PM 09/04/2021

## 2021-09-08 DIAGNOSIS — J3089 Other allergic rhinitis: Secondary | ICD-10-CM | POA: Diagnosis not present

## 2021-09-08 DIAGNOSIS — J301 Allergic rhinitis due to pollen: Secondary | ICD-10-CM | POA: Diagnosis not present

## 2021-09-08 DIAGNOSIS — J3081 Allergic rhinitis due to animal (cat) (dog) hair and dander: Secondary | ICD-10-CM | POA: Diagnosis not present

## 2021-09-16 ENCOUNTER — Telehealth: Payer: Self-pay | Admitting: Pulmonary Disease

## 2021-09-16 NOTE — Telephone Encounter (Signed)
Spoke with the pt and notified that the urine lab was ordered  She verbalized understanding  Nothing further needed

## 2021-09-16 NOTE — Telephone Encounter (Signed)
Lmtcb for pt.  Urine test ordered.

## 2021-09-17 ENCOUNTER — Ambulatory Visit: Payer: PPO | Admitting: Pulmonary Disease

## 2021-09-18 ENCOUNTER — Encounter: Payer: Self-pay | Admitting: Pulmonary Disease

## 2021-09-18 ENCOUNTER — Ambulatory Visit: Payer: PPO | Admitting: Pulmonary Disease

## 2021-09-18 VITALS — BP 116/84 | HR 85 | Ht 63.5 in | Wt 143.0 lb

## 2021-09-18 DIAGNOSIS — Z5181 Encounter for therapeutic drug level monitoring: Secondary | ICD-10-CM

## 2021-09-18 NOTE — Patient Instructions (Addendum)
Continue Ofev twice daily  We will follow up with you about the 24hr urine protein test and make further decisions from there about the Ofev.  Continue monthly lab checks through October  Follow up in 3 months

## 2021-09-18 NOTE — Progress Notes (Signed)
Synopsis: Referred in January 2022 by Pricilla Holm, MD for Chronic Cough  Subjective:   PATIENT ID: Sheri Andrews GENDER: female DOB: [birth year], MRN: [id number]  HPI  Chief Complaint  Patient presents with   Follow-up    3 mo f/u for ILD. States her breathing has been stable since last visit.   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   She met with Dr. Vaughan Browner on 07/09/21 for second opinion. 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 She was started on hydroxychloroquine for the Sjogren's. It was recommended that we consider adding cellcept to her treatment regimen by Dr. Vaughan Browner.    She was started on Ofev in 07/2021. We received message on 5/16 that she is noticing foamy urine so a UA was performed by her PCP which showed elevated protein in the urine, presence of calcium oxalate crystals and elevated microalbumin. We have ordered her for a 24 hour urine collection to quantify the protein in her urine. This is noted for be a very rare side effect from Ofev as discussed with representatives from the [facility] and my colleagues.   She has tolerated Ofev well without much side effects. She has lost 5lbs and has been trying to lose weight by going to the gym.   Timeline for recent medications and proteinuria: 4/17 - Started Ofev  5/4 - Started hydroxychloroquine 5/7-9 Noted foamy urine 5/11 - UA performed indicating proteinuria 5/24 - Stopped hydroxychloroquine   OV 06/04/21 HRCT Chest was ordered after last visit due to abnormal flow volume loop concerning for either fixed airway obstruction versus variable extrathoracic airway obstruction. CT scan showed some  patchy areas of ground glass attenuation, septal thickening and parenchymal banding in the lungs bilaterally, most evident in the mid to lower lungs. Very mild cylindrical traction bronchiectasis and peripheral bronchiolectasis. No frank honeycombing. Inspiratory and expiratory imaging indicates mild air trapping. Final read was concerning for probable usual interstitial pneumonia.   These results were discussed with the patient at her visit with Rexene Edison, NP on 05/12/21 and an inflammatory panel was checked with a positive ANA titer of 1:40 with elevated SSA ab at 6.4. She was referred to rheumatology and has an appointment on 06/27/21 with Dr. Benjamine Mola.   She denies any diffuse joint aches or skin changes. She does have some dry eyes. No family history of pulmonary fibrosis or autoimmune conditions.  OV 04/08/21 She was trialed on Breztri inhaler after last visit and she does report some improvement in her breathing.  She was seen in video visit on 04/02/2021 for increasing chest tightness, nonproductive cough and some wheezing.  She was provided with steroid taper and noted improvement in her cough and wheezing but she continues to experience chest tightness.  Pulmonary function test today shows moderate obstructive defect with flow volume loop concerning for possible fixed airway obstruction versus possible variable extrathoracic airway obstruction.  OV 02/24/21 She has been using symbicort 160-4.16mg 2 puffs twice daily but has noticed increase shortness of breath since having covid 19 in June. She was treated outpatient with an antiviral medication. She denies night time awakenings. She is mainly experiencing exertional dyspnea and does notice wheezing when she is walking for exercise.  She continues on medications for her GERD and sinus congestion/drainage with out issues.  OV 05/29/20 She presented to the [facility] ER on 04/16/20 with sinus pressure, headaches, cough and sore throat. She tested  negative for Covid 19. Chest radiograph was unremarkable as well as BMP and CBC. She was provided with azelastine nasal spray and discharged home. She complains of cough since her ER visit that is dry. She has congestion with post-nasal drip. She has some sinus congestion and ear discomfort. She has shortness of breath with exertion and with coughing episodes. She has history of hiatal hernia and is on pantoprazole and famotidine for GERD. She reports a substernal chest discomfort that is not worse with exertion or associated with nausea or diaphoresis.   She is taking symbicort 160-4.90mg 2 puffs twice daily and does not complain of frequent wheezing. She is on montelukast for asthma and allergies. She is on flonase daily.   She has seen Dr. SWilburn Corneliain ENT perviously for pansinusitis, last seen in 06/2018.          Past Medical History:  Diagnosis Date   Acid reflux disease    Allergic rhinitis    Anxiety    Asthma    Barrett esophagus    Dyspnea    Dysthymia    Hypercholesterolemia    Hypertension    Irritable bowel syndrome    Nephrolithiasis    Osteopenia    Osteoporosis    Other chest pain    Pulmonary fibrosis (HCC)    Sjogren's disease ([place]    Status post dilation of esophageal narrowing      Family History  Problem Relation Age of Onset   Heart disease Mother    Diabetes Mother    Heart disease Father    Diabetes Sister    Heart disease Sister    Diabetes Brother    Heart disease Brother    Heart disease Brother    Cancer Maternal Uncle        type unknown   Throat cancer Paternal Uncle    Colon cancer Neg Hx    Stomach cancer Neg Hx    Rectal cancer Neg Hx    Pancreatic cancer Neg Hx      Social History   Socioeconomic History   Marital status: Married    Spouse name: Not on file   Number of children: 2   Years of education: Not on file   Highest education level: Not on file  Occupational History   Occupation: retired  Tobacco Use   Smoking status:  Never   Smokeless tobacco: Never  Vaping Use   Vaping Use: Never used  Substance and Sexual Activity   Alcohol use: Yes    Alcohol/week: 2.0 standard drinks    Types: 2 Shots of liquor per week   Drug use: No   Sexual activity: Yes    Partners: Male  Other Topics Concern   Not on file  Social History Narrative   Not on file   Social Determinants of Health   Financial Resource Strain: Not on file  Food Insecurity: Not on file  Transportation Needs: Not on file  Physical Activity: Not on file  Stress: Not on file  Social Connections: Not on file  Intimate Partner Violence: Not on file     Allergies  Allergen Reactions   Amoxicillin Other (See Comments)    Upset stomach   Wellbutrin [Bupropion] Swelling   Doxycycline Hyclate Other (See Comments)    upset  stomach   Erythromycin Ethylsuccinate Other (See Comments)     upset stomach     Outpatient Medications Prior to Visit  Medication Sig Dispense Refill   albuterol (VENTOLIN HFA) 108 (90 Base) MCG/ACT inhaler 1-2 puff as needed 18 g 3   amLODipine (NORVASC) 10 MG tablet TAKE 1 TABLET(10 MG) BY MOUTH DAILY (Patient taking differently: Take 10 mg by mouth every evening.) 90 tablet 2   Artificial Tear Solution (TEARS NATURALE OP) Place 1 drop into both eyes See admin instructions. Use 1 drop into both eyes (scheduled) in the morning & may use 3 times daily if needed for dry /irritated eyes.     atorvastatin (LIPITOR) 40 MG tablet TAKE 1 TABLET BY MOUTH DAILY. (Patient taking differently: Take 40 mg by mouth every evening.) 90 tablet 2   azelastine (OPTIVAR) 0.05 % ophthalmic solution Place 1 drop into both eyes daily as needed for allergies.     Azelastine HCl 0.15 % SOLN USE 2 SPRAYS IN EACH NOSTRIL IN THE MORNING AND AT BEDTIME (Patient taking differently: Place 2 sprays into both nostrils 2 (two) times daily as needed (allergies.).) 30 mL 0   benzonatate (TESSALON PERLES) 100 MG capsule Take 1 capsule (100 mg total) by  mouth 3 (three) times daily as needed. 30 capsule 0   budesonide-formoterol (SYMBICORT) 160-4.5 MCG/ACT inhaler Inhale 2 puffs into the lungs 2 (two) times daily. (Patient taking differently: Inhale 2 puffs into the lungs 2 (two) times daily as needed (respiratory issues.).) 1 each 6   clonazePAM (KLONOPIN) 0.25 MG disintegrating tablet DISSOLVE ONE TABLET BY MOUTH EVERY DAY AS NEEDED 30 tablet 0   EPIPEN 2-PAK 0.3 MG/0.3ML SOAJ injection Inject 0.3 mg into the muscle as needed for anaphylaxis.   1   escitalopram (LEXAPRO) 10 MG tablet Take 1 tablet (10 mg total) by mouth daily. (Patient taking differently: Take 10 mg by mouth at bedtime.) 90 tablet 3   estrogen, conjugated,-medroxyprogesterone (PREMPRO) 0.45-1.5 MG per tablet Take 1 tablet by mouth at bedtime.      fluticasone (FLONASE) 50 MCG/ACT nasal spray Place 2 sprays into both nostrils in the morning and at bedtime.     ipratropium (ATROVENT) 0.03 % nasal spray Place 2 sprays into both nostrils every 12 (twelve) hours. (Patient taking differently: Place 2 sprays into both nostrils 2 (two) times daily as needed (allergies.).) 30 mL 12   lansoprazole (PREVACID) 30 MG capsule Take 1 capsule (30 mg total) by mouth 2 (two) times daily before a meal. 60 capsule 3   montelukast (SINGULAIR) 10 MG tablet Take 1 tablet (10 mg total) by mouth at bedtime. 90 tablet 3   Nintedanib (OFEV) 150 MG CAPS Take 1 capsule (150 mg total) by mouth 2 (two) times daily. 180 capsule 1   Olopatadine HCl 0.7 % SOLN Place 1 drop into both eyes daily as needed (allergies.).      polyethylene glycol (MIRALAX / GLYCOLAX) 17 g packet Take 17 g by mouth in the morning.     Prenatal Vit-Fe Fumarate-FA (PRENATAL VITAMIN PO) Take 1 tablet by mouth every evening.     Probiotic Product (PROBIOTIC COLON SUPPORT PO) Take 1 capsule by mouth at bedtime.      Spacer/Aero-Holding Chambers (VALVED HOLDING CHAMBER) DEVI SMARTSIG:Inhalation Via Inhaler As Directed     sucralfate (CARAFATE)  1 g tablet Take 1 tablet (1 g total) by mouth 4 (four) times daily -  with meals and at bedtime. 60 tablet 0   meloxicam (MOBIC)  15 MG tablet Take 15 mg by mouth daily.     hydroxychloroquine (PLAQUENIL) 200 MG tablet Take 1 tablet (200 mg total) by mouth daily. (Patient not taking: Reported on 09/18/2021) 30 tablet 3   No facility-administered medications prior to visit.    Review of Systems  Constitutional:  Negative for chills, fever, malaise/fatigue and weight loss.  HENT:  Negative for congestion, sinus pain and sore throat.   Eyes: Negative.   Respiratory:  Positive for shortness of breath. Negative for cough, hemoptysis, sputum production and wheezing.   Cardiovascular:  Negative for chest pain, palpitations, orthopnea, claudication and leg swelling.  Gastrointestinal:  Negative for abdominal pain, heartburn, nausea and vomiting.  Genitourinary: Negative.   Musculoskeletal:  Negative for joint pain and myalgias.  Skin:  Negative for rash.  Neurological:  Negative for weakness.  Endo/Heme/Allergies: Negative.   Psychiatric/Behavioral: Negative.     Objective:   Vitals:   09/18/21 1454  BP: 116/84  Pulse: 85  SpO2: 99%  Weight: 143 lb (64.9 kg)  Height: 5' 3.5" (1.613 m)   Physical Exam Constitutional:      General: She is not in acute distress.    Appearance: Normal appearance. She is not ill-appearing.  HENT:     Head: Normocephalic and atraumatic.  Eyes:     General: No scleral icterus.    Conjunctiva/sclera: Conjunctivae normal.  Cardiovascular:     Rate and Rhythm: Normal rate and regular rhythm.     Pulses: Normal pulses.     Heart sounds: Normal heart sounds. No murmur heard. Pulmonary:     Effort: Pulmonary effort is normal.     Breath sounds: No wheezing, rhonchi or rales.  Musculoskeletal:     Right lower leg: No edema.     Left lower leg: No edema.  Skin:    General: Skin is warm and dry.  Neurological:     General: No focal deficit present.      Mental Status: She is alert.   CBC    Component Value Date/Time   WBC 7.7 08/29/2021 1315   RBC 4.36 08/29/2021 1315   HGB 13.2 08/29/2021 1315   HCT 38.0 08/29/2021 1315   PLT 224 08/29/2021 1315   MCV 87.2 08/29/2021 1315   MCH 30.3 08/29/2021 1315   MCHC 34.7 08/29/2021 1315   RDW 13.3 08/29/2021 1315   LYMPHSABS 2.6 06/19/2021 1239   MONOABS 0.5 06/19/2021 1239   EOSABS 0.1 06/19/2021 1239   BASOSABS 0.0 06/19/2021 1239      Latest Ref Rng & Units 08/29/2021    1:15 PM 08/04/2021    2:59 PM 06/19/2021   12:39 PM  BMP  Glucose 70 - 99 mg/dL 107   103   83    BUN 8 - 23 mg/dL _0 Creatinine 0.44 - 1.00 mg/dL 0.90   0.97   0.96    Sodium 135 - 145 mmol/L 139   138   140    Potassium 3.5 - 5.1 mmol/L 3.4   3.7   3.7    Chloride 98 - 111 mmol/L 106   105   104    CO2 22 - 32 mmol/L _1 Calcium 8.9 - 10.3 mg/dL 9.9   9.4   9.4     Chest imaging: HRCT Chest 04/29/21 1. The appearance of the lungs is compatible with interstitial lung disease, with a spectrum  of findings considered probable usual interstitial pneumonia (UIP) per current ATS guidelines. These findings are clearly progressive compared to prior cardiac CT 01/25/2020. Repeat high-resolution chest CT is suggested in 12 months to assess for temporal changes in the appearance of the lung parenchyma. 2. Aortic atherosclerosis, in addition to left main and 2 vessel coronary artery disease.   CXR 02/24/21 Increased interstitial markings bilaterally  CXR 04/16/20 The heart size and mediastinal contours are within normal limits. Both lungs are clear. No pleural effusion. The visualized skeletal structures are unremarkable.  PFT:    Latest Ref Rng & Units 04/08/2021   11:06 AM  PFT Results  FVC-Pre L 2.74    FVC-Predicted Pre % 96    FVC-Post L 2.77    FVC-Predicted Post % 97    Pre FEV1/FVC % % 66    Post FEV1/FCV % % 50    FEV1-Pre L 1.82    FEV1-Predicted Pre % 85    FEV1-Post L  1.37    DLCO uncorrected ml/min/mmHg 22.11    DLCO UNC% % 117    DLCO corrected ml/min/mmHg 22.11    DLCO COR %Predicted % 117    DLVA Predicted % 106    TLC L 5.14    TLC % Predicted % 104    RV % Predicted % 92      Labs:  Path:  Echo:  Heart Catheterization:  Assessment & Plan:   No diagnosis found.  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   She was started on Ofev 4/17 and has tolerated the medication well.   We are undergoing workup for proteinuria at this time which could be related to Ofev vs hydroxychloroquine. We have ordered a 24 hour urine study. Hydroxychloroquine has long half life and could remain in her system for over a month.   Based on her urine study we will determine the next steps on whether to continue/stop ofev.   We will check CMPs monthly since she is in the initial start time of Ofev.   We will consider Cellcept therapy in the future.   Follow up in 3 months.   Freda Jackson, MD [facility] Office: [phone]      Current Outpatient Medications:    albuterol (VENTOLIN HFA) 108 (90 Base) MCG/ACT inhaler, 1-2 puff as needed, Disp: 18 g, Rfl: 3   amLODipine (NORVASC) 10 MG tablet, TAKE 1 TABLET(10 MG) BY MOUTH DAILY (Patient taking differently: Take 10 mg by mouth every evening.), Disp: 90 tablet, Rfl: 2   Artificial Tear Solution (TEARS NATURALE OP), Place 1 drop into both eyes See admin instructions. Use 1 drop into both eyes (scheduled) in the morning & may use 3 times daily if needed for dry /irritated eyes., Disp: , Rfl:    atorvastatin (LIPITOR) 40 MG tablet, TAKE 1 TABLET BY MOUTH DAILY. (Patient taking differently: Take 40 mg by mouth every evening.), Disp: 90 tablet, Rfl: 2   azelastine (OPTIVAR) 0.05 % ophthalmic solution,  Place 1 drop into both eyes daily as needed for allergies., Disp: , Rfl:    Azelastine HCl 0.15 % SOLN, USE 2 SPRAYS IN EACH NOSTRIL IN THE MORNING AND AT BEDTIME (Patient taking differently: Place 2 sprays into both nostrils 2 (two) times daily as needed (allergies.).), Disp: 30 mL, Rfl: 0  benzonatate (TESSALON PERLES) 100 MG capsule, Take 1 capsule (100 mg total) by mouth 3 (three) times daily as needed., Disp: 30 capsule, Rfl: 0   budesonide-formoterol (SYMBICORT) 160-4.5 MCG/ACT inhaler, Inhale 2 puffs into the lungs 2 (two) times daily. (Patient taking differently: Inhale 2 puffs into the lungs 2 (two) times daily as needed (respiratory issues.).), Disp: 1 each, Rfl: 6   clonazePAM (KLONOPIN) 0.25 MG disintegrating tablet, DISSOLVE ONE TABLET BY MOUTH EVERY DAY AS NEEDED, Disp: 30 tablet, Rfl: 0   EPIPEN 2-PAK 0.3 MG/0.3ML SOAJ injection, Inject 0.3 mg into the muscle as needed for anaphylaxis. , Disp: , Rfl: 1   escitalopram (LEXAPRO) 10 MG tablet, Take 1 tablet (10 mg total) by mouth daily. (Patient taking differently: Take 10 mg by mouth at bedtime.), Disp: 90 tablet, Rfl: 3   estrogen, conjugated,-medroxyprogesterone (PREMPRO) 0.45-1.5 MG per tablet, Take 1 tablet by mouth at bedtime. , Disp: , Rfl:    fluticasone (FLONASE) 50 MCG/ACT nasal spray, Place 2 sprays into both nostrils in the morning and at bedtime., Disp: , Rfl:    ipratropium (ATROVENT) 0.03 % nasal spray, Place 2 sprays into both nostrils every 12 (twelve) hours. (Patient taking differently: Place 2 sprays into both nostrils 2 (two) times daily as needed (allergies.).), Disp: 30 mL, Rfl: 12   lansoprazole (PREVACID) 30 MG capsule, Take 1 capsule (30 mg total) by mouth 2 (two) times daily before a meal., Disp: 60 capsule, Rfl: 3   montelukast (SINGULAIR) 10 MG tablet, Take 1 tablet (10 mg total) by mouth at bedtime., Disp: 90 tablet, Rfl: 3   Nintedanib (OFEV) 150 MG CAPS, Take 1 capsule (150 mg total) by mouth 2 (two) times  daily., Disp: 180 capsule, Rfl: 1   Olopatadine HCl 0.7 % SOLN, Place 1 drop into both eyes daily as needed (allergies.). , Disp: , Rfl:    polyethylene glycol (MIRALAX / GLYCOLAX) 17 g packet, Take 17 g by mouth in the morning., Disp: , Rfl:    Prenatal Vit-Fe Fumarate-FA (PRENATAL VITAMIN PO), Take 1 tablet by mouth every evening., Disp: , Rfl:    Probiotic Product (PROBIOTIC COLON SUPPORT PO), Take 1 capsule by mouth at bedtime. , Disp: , Rfl:    Spacer/Aero-Holding Chambers (VALVED HOLDING CHAMBER) DEVI, SMARTSIG:Inhalation Via Inhaler As Directed, Disp: , Rfl:    sucralfate (CARAFATE) 1 g tablet, Take 1 tablet (1 g total) by mouth 4 (four) times daily -  with meals and at bedtime., Disp: 60 tablet, Rfl: 0   hydroxychloroquine (PLAQUENIL) 200 MG tablet, Take 1 tablet (200 mg total) by mouth daily. (Patient not taking: Reported on 09/18/2021), Disp: 30 tablet, Rfl: 3

## 2021-09-19 ENCOUNTER — Other Ambulatory Visit (INDEPENDENT_AMBULATORY_CARE_PROVIDER_SITE_OTHER): Payer: PPO

## 2021-09-19 DIAGNOSIS — Z5181 Encounter for therapeutic drug level monitoring: Secondary | ICD-10-CM

## 2021-09-19 DIAGNOSIS — J849 Interstitial pulmonary disease, unspecified: Secondary | ICD-10-CM

## 2021-09-19 DIAGNOSIS — J3089 Other allergic rhinitis: Secondary | ICD-10-CM | POA: Diagnosis not present

## 2021-09-19 DIAGNOSIS — J301 Allergic rhinitis due to pollen: Secondary | ICD-10-CM | POA: Diagnosis not present

## 2021-09-19 LAB — COMPREHENSIVE METABOLIC PANEL
ALT: 27 U/L (ref 0–35)
AST: 28 U/L (ref 0–37)
Albumin: 4.1 g/dL (ref 3.5–5.2)
Alkaline Phosphatase: 47 U/L (ref 39–117)
BUN: 13 mg/dL (ref 6–23)
CO2: 26 mEq/L (ref 19–32)
Calcium: 9.3 mg/dL (ref 8.4–10.5)
Chloride: 106 mEq/L (ref 96–112)
Creatinine, Ser: 0.77 mg/dL (ref 0.40–1.20)
GFR: 77.58 mL/min (ref >60.00)
Glucose, Bld: 95 mg/dL (ref 70–99)
Potassium: 3.6 mEq/L (ref 3.5–5.1)
Sodium: 139 mEq/L (ref 135–145)
Total Bilirubin: 0.4 mg/dL (ref 0.2–1.2)
Total Protein: 7.3 g/dL (ref 6.0–8.3)

## 2021-09-20 LAB — EXTRA SPECIMEN

## 2021-09-20 LAB — PROTEIN, URINE, 24 HOUR: Protein, 24H Urine: 1260 mg/24 h — ABNORMAL HIGH (ref 0–149)

## 2021-09-22 DIAGNOSIS — J301 Allergic rhinitis due to pollen: Secondary | ICD-10-CM | POA: Diagnosis not present

## 2021-09-22 DIAGNOSIS — J3089 Other allergic rhinitis: Secondary | ICD-10-CM | POA: Diagnosis not present

## 2021-09-22 DIAGNOSIS — J3081 Allergic rhinitis due to animal (cat) (dog) hair and dander: Secondary | ICD-10-CM | POA: Diagnosis not present

## 2021-09-24 ENCOUNTER — Telehealth: Payer: Self-pay | Admitting: Pulmonary Disease

## 2021-09-24 DIAGNOSIS — J3089 Other allergic rhinitis: Secondary | ICD-10-CM | POA: Diagnosis not present

## 2021-09-24 DIAGNOSIS — R809 Proteinuria, unspecified: Secondary | ICD-10-CM

## 2021-09-24 DIAGNOSIS — J849 Interstitial pulmonary disease, unspecified: Secondary | ICD-10-CM

## 2021-09-24 DIAGNOSIS — J301 Allergic rhinitis due to pollen: Secondary | ICD-10-CM | POA: Diagnosis not present

## 2021-09-25 NOTE — Telephone Encounter (Signed)
Called [facility] twice at [phone]. No one answered the phone nor did I hear a recording.   Called and spoke with patient. She is aware that I will keep trying to get in contact with [facility]. I also spoke to her about the referral to see Dr. Herby Abraham in [facility] about the proteinuria. She is still interested in this referral. I advised her that I would go ahead and send in the referral for her.   Referral has been placed.   Nothing further needed at time of call.

## 2021-09-29 ENCOUNTER — Encounter: Payer: Self-pay | Admitting: Internal Medicine

## 2021-09-30 ENCOUNTER — Encounter: Payer: Self-pay | Admitting: Gastroenterology

## 2021-10-06 ENCOUNTER — Encounter: Payer: Self-pay | Admitting: Pulmonary Disease

## 2021-10-06 NOTE — Telephone Encounter (Signed)
Separate message sent by pt with same info which has been sent to pharmacy team as well as Dr. Erin Fulling.

## 2021-10-06 NOTE — Telephone Encounter (Signed)
Discussed with patient Dr. August Albino recommendation of holding hydroxychloroquine for at least one month before repeating a 24-hour urine collection. Patient reports she has been off hydroxychlorquine since May 18th. Discussed with patient that we do not want to make too many changes at once as we will not be sure which medication, if any, could be affecting the protein in her urine. She endorses understanding of this and will schedule an appointment to repeat the 24-hour urine collection.   She also notes to have swelling in her legs. However, she reports no major weight changes. No edema/swelling adverse effects reported in the [facility] package insert.   Joseph Art, Pharm.D. PGY-1 Pharmacy Resident 10/06/2021 2:01 PM

## 2021-10-06 NOTE — Telephone Encounter (Signed)
Mychart message sent by pt: [facility] (supporting Freddi Starr, MD) 56 minutes ago (10:13 AM)    I would like to talk to [facility] about changing my dosage of Ofev due to leg and ankle swelling. Plus protein in my urine. Thank you.   Routing to pharmacy team as well as Dr. Erin Fulling for them to review. Please advise.

## 2021-10-07 ENCOUNTER — Encounter: Payer: Self-pay | Admitting: Pulmonary Disease

## 2021-10-07 DIAGNOSIS — Z5181 Encounter for therapeutic drug level monitoring: Secondary | ICD-10-CM

## 2021-10-07 DIAGNOSIS — R809 Proteinuria, unspecified: Secondary | ICD-10-CM

## 2021-10-07 NOTE — Telephone Encounter (Signed)
Received a message from patient stating that she wanted to repeat her urine test. She mentioned also completing blood work but I did not see any orders.   Dr. Erin Fulling, please advise if she needs blood work as well. Thanks!

## 2021-10-22 ENCOUNTER — Encounter: Payer: Self-pay | Admitting: Pulmonary Disease

## 2021-10-22 NOTE — Telephone Encounter (Signed)
Received the following message from patient:   "I have contacted poison ivy. It gets in my blood stream and goes to different body parts. I have to take prednisone to get it to go away. Can I take it with Ofev? Thank you"  Dr. Erin Fulling, can you please advise? Thanks!

## 2021-10-24 NOTE — Telephone Encounter (Signed)
Dr. Erin Fulling please Denice Paradise on the following My Chart message:   [facility] (supporting Freddi Starr, MD) 19 hours ago (3:10 PM)    I have had diarrhea every half hour since 200am. Not eating anything. , just liquids. Never had it that bad  Should I stop ofev for a couple days? Would you relay this info to [facility] ?   I did not start the prednisone . Thank you.

## 2021-10-28 ENCOUNTER — Ambulatory Visit (INDEPENDENT_AMBULATORY_CARE_PROVIDER_SITE_OTHER): Payer: PPO | Admitting: Pulmonary Disease

## 2021-10-28 DIAGNOSIS — J849 Interstitial pulmonary disease, unspecified: Secondary | ICD-10-CM | POA: Diagnosis not present

## 2021-10-29 ENCOUNTER — Telehealth: Payer: Self-pay | Admitting: Pharmacist

## 2021-10-29 DIAGNOSIS — J849 Interstitial pulmonary disease, unspecified: Secondary | ICD-10-CM

## 2021-10-29 MED ORDER — OFEV 150 MG PO CAPS: 150.0000 mg | ORAL_CAPSULE | Freq: Two times a day (BID) | ORAL | 2 refills | Status: DC

## 2021-10-29 NOTE — Telephone Encounter (Signed)
Patient returned call. She states she has enough Ofev '150mg'$  capsules to get through 11/14/21.  I discussed that if she is having recurrent diarrhea affecting her daily life, she can re-challenge with 100 mg capsules twice daily. She states she is willing to try this. However at this time, since she is planning to repeat urine test for elevated protein in urine and we do not want to make multiple medication changes at once, we will hold on sending in prescription for '100mg'$  capsules. Patient advised that I will send in refill for '150mg'$  capsules for only 30 day supply so if we need to make change to the lower dose after her office visit with Dr. Erin Fulling, we can more easily do that.   She verbalized understanding to all of the above.  Knox Saliva, PharmD, MPH, BCPS, CPP Clinical Pharmacist (Rheumatology and Pulmonology)

## 2021-10-29 NOTE — Telephone Encounter (Signed)
Received fax from [facility] requesting refill on ofev. Per chart review, patient has had diarrhea episodes.  Left VM for patient inquiring how much supply she has remaining. If recurrent diarrhea, she can consider lowering Ofev to '100mg'$  twice daily and see how she tolerates.  She has OV with Dr. Erin Fulling on 11/20/21. Will f/u w pt  Knox Saliva, PharmD, MPH, BCPS, CPP Clinical Pharmacist (Rheumatology and Pulmonology)

## 2021-10-30 NOTE — Telephone Encounter (Signed)
24 hour urine and CMET have been ordered. See phone note from 7/12 regarding pts OFEV.

## 2021-11-02 ENCOUNTER — Encounter: Payer: Self-pay | Admitting: Pulmonary Disease

## 2021-11-02 NOTE — Progress Notes (Signed)
Interstitial Lung Disease Multidisciplinary Conference   Sheri Andrews    MRN [id number]    DOB 11-19-1950  Primary Care Physician:Crawford, Austin Miles, MD  Referring Physician: Dr Charolett Bumpers MD  Time of Conference: 7.30am- 8.30am Date of conference: 10/28/2021 Location of Conference: -  Virtual  Participating Pulmonary: Dr Chilton Greathouse MD, Dr. Rise Paganini MD, Dr. 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 She has been started on ofev for CTD related ILD. PFTs are within normal limits. She was seen in consult by Dr. Isaiah Serge 3/22 with recommendation of adding cellcept to her treatment regimen.   Ofev led to problems with proteinuria and is currently on hold.  Case discussed at multidisciplinary conference for additional treatment recommendations.  Serology:  05/12/2021 ANA 1 is to 140, SSA 6.4  MDD discussion of CT scan   CT scan 04/29/21 reviewed showing lower lung predominant findings with predominantly groundglass opacities, mild traction bronchiectasis.  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  Review of scan shows more predominantly inflammatory findings with groundglass changes.  She is intolerant of Ofev.  Would be reasonable to try her on CellCept and if there is progression on adequate treatment then consider alternate antifibrotic with Esbriet.  Time Spent in preparation and discussion:  > 30 min   SIGNATURE   Truman Aceituno MD [facility] See Amion for pager  If no response to pager , please call [phone] until 7pm After 7:00 pm call Elink  [phone] 11/02/2021, 11:19 AM

## 2021-11-06 ENCOUNTER — Telehealth: Payer: Self-pay | Admitting: Pulmonary Disease

## 2021-11-06 NOTE — Telephone Encounter (Signed)
Received a fax from [facility] stating that they have processed the referral. Patient will see Dr. Wilhemina Bonito on 12/24/21 at 11am.

## 2021-11-10 ENCOUNTER — Other Ambulatory Visit: Payer: Self-pay | Admitting: Gastroenterology

## 2021-11-12 ENCOUNTER — Ambulatory Visit: Payer: PPO | Admitting: Pulmonary Disease

## 2021-11-17 NOTE — Progress Notes (Deleted)
Office Visit Note  Patient: Sheri Andrews             Date of Birth: Jan 21, 1951           MRN: [id number]             PCP: [name], MD Referring: [name], * Visit Date: 11/24/2021   Subjective:  No chief complaint on file.   History of Present Illness: Sheri Andrews is a 71 y.o. 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   Previous HPI 08/20/2021 Since our last visit she is doing about the same starting Ofev treatment with Dr. Vaughan Browner. She saw Dr. Wilburn Cornelia completed course of antibiotics and had CT imaging of her head no evidence of swelling involvement to her salivary glands or lymph nodes appears more consistent for preauricular cyst.   Previous HPI 06/27/21 Sheri Andrews is a 71 y.o. female here for evaluation of Sjogren's syndrome. She had recent positive ANA and SSA Abs checked associated with new diagnosis of ILD. She has a previous history of moderate persistent asthma, GAVE, and barrett's esophagus. She had December visit with Dr. Erin Fulling for moderate persistent asthma with obstructive PFT findings and HRCT demonstrated definite changes compared to 2021. So far PFTs have not demonstrated any significant restrictive changes. Besides pulmonary issues she has a longstanding history of dry eyes and mouth complaints. She attributed this to her chronic allergic rhinitis and conjunctivitis and frequent sinusitis infections. She has taken allergy shots and frequent antihistamine medication for these problems. She sees ophthalmology recent exam with cataracts and also has some epiretinal membrane changes in both eyes. She mostly treats dry eyes with artificial tears/systane/refresh. She estimates dry mouth symptoms are since about 20 years ago. Just drinks a lot of fluid to treat this. She has numerous crowns no particular mouth ulcers or lesions. She has had oral thrush but was attributed to steroid  inhaler for asthma. She has dysphagia reports inability to eat without drinking fluids between solid foods. 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 Silverio Decamp for this problem. She takes PPI long term for heartburn and cough. She has mild severity of joint pain increased in her hands within the past few years. Does not have prolonged morning stiffness. Sometimes swelling in bilateral hands not associated with numbness or decreased strength. She notices some redness and nodules in distal finger joints. Intermittently swelling in the feet and ankles nothing persistently. She has some progressive 1st MTP bunions and left lateral foot tendonitis. Right cyst in front of right ear this is very chronic she discussed surgical removal with dermatologist a year ago. She sometimes expresses drainage from this during increased swelling. Recently worse with tenderness, swelling, and erythema. She denies any lymph node swelling, fevers, skin rashes, or raynaud's symptoms. Most recent metabolic panel eGFR 59 no history of problems. Most recent CBC slightly elevated relative lymphocytes 52.6% normal total level.   Labs reviewed ANA 1:40 speckled SSA 6.4 dsDNA, SSB, Scl-70 neg RF neg CCP neg ANCA neg CBC 52.6% lymphs   No Rheumatology ROS completed.   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   High risk medication use 06/27/2021   ILD (interstitial lung disease) ([place]) 05/12/2021   Left foot pain 01/24/2021    Frontal headache 11/19/2020   Right epiretinal membrane 01/03/2020   Left epiretinal membrane 01/03/2020   Nuclear sclerotic cataract of right eye 01/03/2020   Nuclear sclerotic cataract of left eye 01/03/2020   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   Anatomical narrow angle of right eye 01/03/2020   GAVE (gastric antral vascular ectasia)    Melena    Iron deficiency anemia due to chronic blood loss    Chronic pansinusitis 06/22/2018   Nasal turbinate hypertrophy 06/22/2018   Asthma 05/25/2018   Essential hypertension 06/29/2017   Routine general medical examination at a health care facility 05/15/2016   Family history of early CAD 03/09/2014   Osteopenia 02/01/2012   BARRETTS ESOPHAGUS 10/08/2008   NEPHROLITHIASIS 04/30/2007   OCD (obsessive compulsive disorder) 04/11/2007   HYPERCHOLESTEROLEMIA 04/08/2007   Gastroesophageal reflux disease 04/08/2007   IRRITABLE BOWEL SYNDROME 04/08/2007    Past Medical History:  Diagnosis Date   Acid reflux disease    Allergic rhinitis    Anxiety    Asthma    Barrett esophagus    Dyspnea    Dysthymia    Hypercholesterolemia    Hypertension    Irritable bowel syndrome    Nephrolithiasis    Osteopenia    Osteoporosis    Other chest pain    Pulmonary fibrosis (HCC)    Sjogren's disease ([place])    Status post dilation of esophageal narrowing     Family History  Problem Relation Age of Onset   Heart disease Mother    Diabetes Mother    Heart disease Father    Diabetes Sister    Heart disease Sister    Diabetes Brother    Heart disease Brother    Heart disease Brother    Cancer Maternal Uncle        type unknown   Throat cancer Paternal Uncle    Colon cancer Neg Hx    Stomach cancer Neg Hx    Rectal cancer Neg Hx    Pancreatic cancer Neg Hx    Past Surgical History:  Procedure Laterality Date   ESOPHAGOGASTRODUODENOSCOPY (EGD) WITH PROPOFOL N/A 05/02/2019   Procedure: ESOPHAGOGASTRODUODENOSCOPY  (EGD) WITH PROPOFOL;  Surgeon: Mauri Pole, MD;  Location: [facility];  Service: Endoscopy;  Laterality: N/A;  needing APC available   ESOPHAGOGASTRODUODENOSCOPY (EGD) WITH PROPOFOL N/A 08/11/2021   Procedure: ESOPHAGOGASTRODUODENOSCOPY (EGD) WITH PROPOFOL;  Surgeon: Mauri Pole, MD;  Location: [facility];  Service: Gastroenterology;  Laterality: N/A;   FACIAL COSMETIC SURGERY     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 [place]   Social History   Social History Narrative   Not on file   Immunization History  Administered Date(s) Administered   Fluad Quad(high Dose 65+) 01/21/2021   Influenza Split 02/01/2012   Influenza Whole 01/31/2009   Influenza, High Dose Seasonal PF 02/17/2016, 01/25/2017, 01/23/2019, 01/30/2020, 02/11/2021   Influenza,inj,Quad PF,6+ Mos 03/28/2015   Influenza,inj,quad, With Preservative 01/19/2019   Influenza-Unspecified 02/01/2017   PFIZER(Purple Top)SARS-COV-2 Vaccination 05/25/2019, 06/21/2019, 02/27/2020   Pneumococcal Conjugate-13 03/28/2015   Pneumococcal Polysaccharide-23 11/11/2015, 05/14/2016, 01/30/2020, 02/11/2021   Tdap 08/17/2011  Zoster Recombinat (Shingrix) 07/14/2017, 11/11/2017     Objective: Vital Signs: There were no vitals taken for this visit.   Physical Exam   Musculoskeletal Exam: ***  CDAI Exam: CDAI Score: -- Patient Global: --; Provider Global: -- Swollen: --; Tender: -- Joint Exam 11/24/2021   No joint exam has been documented for this visit   There is currently no information documented on the homunculus. Go to the Rheumatology activity and complete the homunculus joint exam.  Investigation: No additional findings.  Imaging: No results found.  Recent Labs: Lab Results  Component Value Date   WBC 7.7 08/29/2021   HGB 13.2 08/29/2021   PLT 224 08/29/2021   NA 139 09/19/2021   K 3.6 09/19/2021   CL 106 09/19/2021   CO2 26 09/19/2021   GLUCOSE 95 09/19/2021    BUN 13 09/19/2021   CREATININE 0.77 09/19/2021   BILITOT 0.4 09/19/2021   ALKPHOS 47 09/19/2021   AST 28 09/19/2021   ALT 27 09/19/2021   PROT 7.3 09/19/2021   ALBUMIN 4.1 09/19/2021   CALCIUM 9.3 09/19/2021   GFRAA  08/11/2008    >60        The eGFR has been calculated using the MDRD equation. This calculation has not been validated in all clinical situations. eGFR's persistently <60 mL/min signify possible Chronic Kidney Disease.    Speciality Comments: No specialty comments available.  Procedures:  No procedures performed Allergies: Amoxicillin, Wellbutrin [bupropion], Doxycycline hyclate, and Erythromycin ethylsuccinate   Assessment / Plan:     Visit Diagnoses: No diagnosis found.  ***  Orders: No orders of the defined types were placed in this encounter.  No orders of the defined types were placed in this encounter.    Follow-Up Instructions: No follow-ups on file.   Bertram Savin, RT  Note - This record has been created using Editor, commissioning.  Chart creation errors have been sought, but may not always  have been located. Such creation errors do not reflect on  the standard of medical care.

## 2021-11-19 ENCOUNTER — Other Ambulatory Visit (INDEPENDENT_AMBULATORY_CARE_PROVIDER_SITE_OTHER): Payer: PPO

## 2021-11-19 DIAGNOSIS — R809 Proteinuria, unspecified: Secondary | ICD-10-CM | POA: Diagnosis not present

## 2021-11-19 DIAGNOSIS — Z5181 Encounter for therapeutic drug level monitoring: Secondary | ICD-10-CM | POA: Diagnosis not present

## 2021-11-19 DIAGNOSIS — H10413 Chronic giant papillary conjunctivitis, bilateral: Secondary | ICD-10-CM | POA: Diagnosis not present

## 2021-11-19 DIAGNOSIS — H25813 Combined forms of age-related cataract, bilateral: Secondary | ICD-10-CM | POA: Diagnosis not present

## 2021-11-19 DIAGNOSIS — M3500 Sicca syndrome, unspecified: Secondary | ICD-10-CM | POA: Diagnosis not present

## 2021-11-19 DIAGNOSIS — H35373 Puckering of macula, bilateral: Secondary | ICD-10-CM | POA: Diagnosis not present

## 2021-11-19 LAB — COMPREHENSIVE METABOLIC PANEL
ALT: 20 U/L (ref 0–35)
AST: 23 U/L (ref 0–37)
Albumin: 4.2 g/dL (ref 3.5–5.2)
Alkaline Phosphatase: 40 U/L (ref 39–117)
BUN: 14 mg/dL (ref 6–23)
CO2: 28 mEq/L (ref 19–32)
Calcium: 9.3 mg/dL (ref 8.4–10.5)
Chloride: 103 mEq/L (ref 96–112)
Creatinine, Ser: 0.82 mg/dL (ref 0.40–1.20)
GFR: 71.85 mL/min (ref >60.00)
Glucose, Bld: 94 mg/dL (ref 70–99)
Potassium: 3.8 mEq/L (ref 3.5–5.1)
Sodium: 140 mEq/L (ref 135–145)
Total Bilirubin: 0.5 mg/dL (ref 0.2–1.2)
Total Protein: 7.2 g/dL (ref 6.0–8.3)

## 2021-11-20 ENCOUNTER — Ambulatory Visit: Payer: PPO | Admitting: Pulmonary Disease

## 2021-11-20 ENCOUNTER — Encounter: Payer: Self-pay | Admitting: Pulmonary Disease

## 2021-11-20 VITALS — BP 112/78 | HR 81 | Ht 63.5 in | Wt 134.2 lb

## 2021-11-20 DIAGNOSIS — J849 Interstitial pulmonary disease, unspecified: Secondary | ICD-10-CM | POA: Diagnosis not present

## 2021-11-20 LAB — PROTEIN, URINE, 24 HOUR: Protein, 24H Urine: 322 mg/24 h — ABNORMAL HIGH (ref 0–149)

## 2021-11-20 NOTE — Patient Instructions (Addendum)
Recommend stopping Ofev  We will plan to start cellcept next month if doing ok after stopping Ofev  I will review your case with Dr. Vaughan Browner again.   Follow up in December with pulmonary function tests and after CT Chest scan   Follow up in December

## 2021-11-20 NOTE — Progress Notes (Signed)
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   She reports 14lbs weight loss, lack of appetite and diarrhea since being on the Ofev. She denies any issues with her breathing at this time. She has follow up with Dr. Benjamine Mola later this month and with nephrology in September.   OV 09/18/21 She met with Dr. Vaughan Browner on 07/09/21 for second opinion. 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 She was started on hydroxychloroquine for the Sjogren's. It was recommended that we consider adding cellcept to her treatment regimen by Dr. Vaughan Browner.    She was started on Ofev in 07/2021. We received message on 5/16 that she is noticing foamy urine so a UA was performed by her PCP which showed elevated protein in the urine, presence of calcium oxalate crystals and elevated microalbumin. We have ordered her for a 24 hour urine collection to quantify the protein in her urine. This is noted for be a very rare side effect from Ofev as discussed with representatives from the [facility] and my colleagues.   She has tolerated Ofev well without much side effects. She has lost 5lbs and has been trying to lose weight by going to the gym.   Timeline for recent medications and proteinuria: 4/17 - Started Ofev  5/4 - Started hydroxychloroquine 5/7-9 Noted foamy urine 5/11 - UA performed indicating proteinuria 5/24 - Stopped hydroxychloroquine   OV  06/04/21 HRCT Chest was ordered after last visit due to abnormal flow volume loop concerning for either fixed airway obstruction versus variable extrathoracic airway obstruction. CT scan showed some patchy areas of ground glass attenuation, septal thickening and parenchymal banding in the lungs bilaterally, most evident in the mid to lower lungs. Very mild cylindrical traction bronchiectasis and peripheral bronchiolectasis. No frank honeycombing. Inspiratory and expiratory imaging indicates mild air trapping. Final read was concerning for probable usual interstitial pneumonia.   These results were discussed with the patient at her visit with Sheri Edison, NP on 05/12/21 and an inflammatory panel was checked with a positive ANA titer of 1:40 with elevated SSA ab at 6.4. She was referred to rheumatology and has an appointment on 06/27/21 with Dr. Benjamine Mola.   She denies any diffuse joint aches or skin changes. She does have some dry eyes. No family history of pulmonary fibrosis or autoimmune conditions.  OV 04/08/21 She was trialed on Breztri inhaler after last visit and she does report some improvement in her breathing.  She was seen in video visit on 04/02/2021 for increasing chest tightness, nonproductive cough and some wheezing.  She was provided with steroid taper and noted improvement in her cough and wheezing but she continues to experience chest tightness.  Pulmonary function test today shows moderate obstructive defect with flow volume loop concerning for possible fixed airway obstruction versus possible variable extrathoracic airway obstruction.  OV 02/24/21 She has been using symbicort 160-4.67mg 2 puffs twice daily but has noticed increase shortness of breath since having  covid 19 in June. She was treated outpatient with an antiviral medication. She denies night time awakenings. She is mainly experiencing exertional dyspnea and does notice wheezing when she is walking for exercise.   She continues  on medications for her GERD and sinus congestion/drainage with out issues.  OV 05/29/20 She presented to the [facility] ER on 04/16/20 with sinus pressure, headaches, cough and sore throat. She tested negative for Covid 19. Chest radiograph was unremarkable as well as BMP and CBC. She was provided with azelastine nasal spray and discharged home. She complains of cough since her ER visit that is dry. She has congestion with post-nasal drip. She has some sinus congestion and ear discomfort. She has shortness of breath with exertion and with coughing episodes. She has history of hiatal hernia and is on pantoprazole and famotidine for GERD. She reports a substernal chest discomfort that is not worse with exertion or associated with nausea or diaphoresis.   She is taking symbicort 160-4.16mg 2 puffs twice daily and does not complain of frequent wheezing. She is on montelukast for asthma and allergies. She is on flonase daily.   She has seen Dr. SWilburn Corneliain ENT perviously for pansinusitis, last seen in 06/2018.          Past Medical History:  Diagnosis Date   Acid reflux disease    Allergic rhinitis    Anxiety    Asthma    Barrett esophagus    Dyspnea    Dysthymia    Hypercholesterolemia    Hypertension    Irritable bowel syndrome    Nephrolithiasis    Osteopenia    Osteoporosis    Other chest pain    Pulmonary fibrosis (HCC)    [place]    Status post dilation of esophageal narrowing      Family History  Problem Relation Age of Onset   Heart disease Mother    Diabetes Mother    Heart disease Father    Diabetes Sister    Heart disease Sister    Diabetes Brother    Heart disease Brother    Heart disease Brother    Cancer Maternal Uncle        type unknown   Throat cancer Paternal Uncle    Colon cancer Neg Hx    Stomach cancer Neg Hx    Rectal cancer Neg Hx    Pancreatic cancer Neg Hx      Social History   Socioeconomic History   Marital status: Married    Spouse  name: Not on file   Number of children: 2   Years of education: Not on file   Highest education level: Not on file  Occupational History   Occupation: retired  Tobacco Use   Smoking status: Never   Smokeless tobacco: Never  Vaping Use   Vaping Use: Never used  Substance and Sexual Activity   Alcohol use: Yes    Alcohol/week: 2.0 standard drinks of alcohol    Types: 2 Shots of liquor per week   Drug use: No   Sexual activity: Yes    Partners: Male  Other Topics Concern   Not on file  Social History Narrative   Not on file   Social Determinants of Health   Financial Resource Strain: Not on file  Food Insecurity: Not on file  Transportation Needs: Not on file  Physical Activity: Not on file  Stress: Not on file  Social Connections: Not on file  Intimate Partner Violence: Not  on file     Allergies  Allergen Reactions   Amoxicillin Other (See Comments)    Upset stomach   Wellbutrin [Bupropion] Swelling   Doxycycline Hyclate Other (See Comments)    upset  stomach   Erythromycin Ethylsuccinate Other (See Comments)     upset stomach     Outpatient Medications Prior to Visit  Medication Sig Dispense Refill   albuterol (VENTOLIN HFA) 108 (90 Base) MCG/ACT inhaler 1-2 puff as needed 18 g 3   amLODipine (NORVASC) 10 MG tablet TAKE 1 TABLET(10 MG) BY MOUTH DAILY (Patient taking differently: Take 10 mg by mouth every evening.) 90 tablet 2   Artificial Tear Solution (TEARS NATURALE OP) Place 1 drop into both eyes See admin instructions. Use 1 drop into both eyes (scheduled) in the morning & may use 3 times daily if needed for dry /irritated eyes.     atorvastatin (LIPITOR) 40 MG tablet TAKE 1 TABLET BY MOUTH DAILY. (Patient taking differently: Take 40 mg by mouth every evening.) 90 tablet 2   azelastine (OPTIVAR) 0.05 % ophthalmic solution Place 1 drop into both eyes daily as needed for allergies.     Azelastine HCl 0.15 % SOLN USE 2 SPRAYS IN EACH NOSTRIL IN THE MORNING AND AT  BEDTIME (Patient taking differently: Place 2 sprays into both nostrils 2 (two) times daily as needed (allergies.).) 30 mL 0   benzonatate (TESSALON PERLES) 100 MG capsule Take 1 capsule (100 mg total) by mouth 3 (three) times daily as needed. 30 capsule 0   budesonide-formoterol (SYMBICORT) 160-4.5 MCG/ACT inhaler Inhale 2 puffs into the lungs 2 (two) times daily. (Patient taking differently: Inhale 2 puffs into the lungs 2 (two) times daily as needed (respiratory issues.).) 1 each 6   clonazePAM (KLONOPIN) 0.25 MG disintegrating tablet DISSOLVE ONE TABLET BY MOUTH EVERY DAY AS NEEDED 30 tablet 0   EPIPEN 2-PAK 0.3 MG/0.3ML SOAJ injection Inject 0.3 mg into the muscle as needed for anaphylaxis.   1   escitalopram (LEXAPRO) 10 MG tablet Take 1 tablet (10 mg total) by mouth daily. (Patient taking differently: Take 10 mg by mouth at bedtime.) 90 tablet 3   estrogen, conjugated,-medroxyprogesterone (PREMPRO) 0.45-1.5 MG per tablet Take 1 tablet by mouth at bedtime.      fluticasone (FLONASE) 50 MCG/ACT nasal spray Place 2 sprays into both nostrils in the morning and at bedtime.     ipratropium (ATROVENT) 0.03 % nasal spray Place 2 sprays into both nostrils every 12 (twelve) hours. (Patient taking differently: Place 2 sprays into both nostrils 2 (two) times daily as needed (allergies.).) 30 mL 12   lansoprazole (PREVACID) 30 MG capsule TAKE 1 CAPSULE(30 MG) BY MOUTH TWICE DAILY BEFORE A MEAL 180 capsule 0   montelukast (SINGULAIR) 10 MG tablet Take 1 tablet (10 mg total) by mouth at bedtime. 90 tablet 3   Nintedanib (OFEV) 150 MG CAPS Take 1 capsule (150 mg total) by mouth 2 (two) times daily. 60 capsule 2   Olopatadine HCl 0.7 % SOLN Place 1 drop into both eyes daily as needed (allergies.).      Prenatal Vit-Fe Fumarate-FA (PRENATAL VITAMIN PO) Take 1 tablet by mouth every evening.     Probiotic Product (PROBIOTIC COLON SUPPORT PO) Take 1 capsule by mouth at bedtime.      Spacer/Aero-Holding Chambers  (VALVED HOLDING CHAMBER) DEVI SMARTSIG:Inhalation Via Inhaler As Directed     hydroxychloroquine (PLAQUENIL) 200 MG tablet Take 1 tablet (200 mg total) by mouth daily. (Patient not taking:  Reported on 11/20/2021) 30 tablet 3   polyethylene glycol (MIRALAX / GLYCOLAX) 17 g packet Take 17 g by mouth in the morning.     sucralfate (CARAFATE) 1 g tablet Take 1 tablet (1 g total) by mouth 4 (four) times daily -  with meals and at bedtime. 60 tablet 0   No facility-administered medications prior to visit.    Review of Systems  Constitutional:  Positive for weight loss. Negative for chills, fever and malaise/fatigue.  HENT:  Negative for congestion, sinus pain and sore throat.   Eyes: Negative.   Respiratory:  Negative for cough, hemoptysis, sputum production and wheezing.   Cardiovascular:  Negative for chest pain, palpitations, orthopnea, claudication and leg swelling.  Gastrointestinal:  Positive for diarrhea. Negative for abdominal pain, heartburn, nausea and vomiting.  Genitourinary: Negative.   Musculoskeletal:  Negative for joint pain and myalgias.  Skin:  Negative for rash.  Neurological:  Negative for weakness.  Endo/Heme/Allergies: Negative.   Psychiatric/Behavioral: Negative.      Objective:   Vitals:   11/20/21 1110  BP: 112/78  Pulse: 81  SpO2: 98%  Weight: 134 lb 3.2 oz (60.9 kg)  Height: 5' 3.5" (1.613 m)   Physical Exam Constitutional:      General: She is not in acute distress.    Appearance: Normal appearance. She is not ill-appearing.  HENT:     Head: Normocephalic and atraumatic.  Cardiovascular:     Rate and Rhythm: Normal rate and regular rhythm.     Pulses: Normal pulses.     Heart sounds: Normal heart sounds. No murmur heard. Pulmonary:     Effort: Pulmonary effort is normal.     Breath sounds: No wheezing, rhonchi or rales.  Musculoskeletal:     Right lower leg: No edema.     Left lower leg: No edema.  Skin:    General: Skin is warm and dry.   Neurological:     General: No focal deficit present.     Mental Status: She is alert.    CBC    Component Value Date/Time   WBC 7.7 08/29/2021 1315   RBC 4.36 08/29/2021 1315   HGB 13.2 08/29/2021 1315   HCT 38.0 08/29/2021 1315   PLT 224 08/29/2021 1315   MCV 87.2 08/29/2021 1315   MCH 30.3 08/29/2021 1315   MCHC 34.7 08/29/2021 1315   RDW 13.3 08/29/2021 1315   LYMPHSABS 2.6 06/19/2021 1239   MONOABS 0.5 06/19/2021 1239   EOSABS 0.1 06/19/2021 1239   BASOSABS 0.0 06/19/2021 1239      Latest Ref Rng & Units 11/19/2021    1:04 PM 09/19/2021   11:01 AM 08/29/2021    1:15 PM  BMP  Glucose 70 - 99 mg/dL 94  95  107   BUN 6 - 23 mg/dL _0 Creatinine 0.40 - 1.20 mg/dL 0.82  0.77  0.90   Sodium 135 - 145 mEq/L 140  139  139   Potassium 3.5 - 5.1 mEq/L 3.8  3.6  3.4   Chloride 96 - 112 mEq/L 103  106  106   CO2 19 - 32 mEq/L _1 Calcium 8.4 - 10.5 mg/dL 9.3  9.3  9.9    Chest imaging: HRCT Chest 04/29/21 1. 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 These findings are clearly progressive  compared to prior cardiac CT 01/25/2020. Repeat high-resolution chest CT is suggested in 12 months to assess for temporal changes in the appearance of the lung parenchyma. 2. Aortic atherosclerosis, in addition to left main and 2 vessel coronary artery disease.   CXR 02/24/21 Increased interstitial markings bilaterally  CXR 04/16/20 The heart size and mediastinal contours are within normal limits. Both lungs are clear. No pleural effusion. The visualized skeletal structures are unremarkable.  PFT:    Latest Ref Rng & Units 04/08/2021   11:06 AM  PFT Results  FVC-Pre L 2.74   FVC-Predicted Pre % 96   FVC-Post L 2.77   FVC-Predicted Post % 97   Pre FEV1/FVC % % 66   Post FEV1/FCV % % 50   FEV1-Pre L 1.82   FEV1-Predicted  Pre % 85   FEV1-Post L 1.37   DLCO uncorrected ml/min/mmHg 22.11   DLCO UNC% % 117   DLCO corrected ml/min/mmHg 22.11   DLCO COR %Predicted % 117   DLVA Predicted % 106   TLC L 5.14   TLC % Predicted % 104   RV % Predicted % 92     Labs:  Path:  Echo:  Heart Catheterization:  Assessment & Plan:   ILD (interstitial lung disease) ([place]) - Plan: CT CHEST HIGH RESOLUTION, Pulmonary Function Test  Discussion: Hudsyn Barich is a 71 year old woman, never smoker with history of allergic rhinitis, asthma and GERD who returns to pulmonary clinic for asthma and 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   She was started on Ofev 4/17 with symptoms of weight loss, diarrhea, and anorexia. She may have also have proteinuria related to this medication. Repeat 24 hour urine study shows urine protein of 334m/24hr compared to 1,269m24hr previously. I believe the hydroxychloroquine contributed mainly to this protreinuria and possibly a component due to Ofev, unless there are other etiologies of the proteinuria which will be further evaluated by nephrology next month.   Given the GI side effects and possible proteinuria, I recommend we stop Ofev at this time. Plan will be to start cellcept in the future for her sjogren's related ILD and if tolerated consider adding back antifibrotic therapy in the future.   We will repeat PFTs and HRCT chest in early December.  Follow up in 4 months.  JoFreda JacksonMD [facility] Office: [phone]    Current Outpatient Medications:    albuterol (VENTOLIN HFA) 108 (90 Base) MCG/ACT inhaler, 1-2 puff as needed, Disp: 18 g, Rfl: 3   amLODipine (NORVASC) 10 MG tablet, TAKE 1 TABLET(10 MG) BY MOUTH DAILY (Patient taking differently: Take 10 mg by mouth every evening.), Disp: 90 tablet, Rfl: 2   Artificial Tear Solution (TEARS NATURALE OP), Place 1 drop into both eyes See admin instructions. Use 1 drop into both  eyes (scheduled) in the morning & may use 3 times daily if needed for dry /irritated eyes., Disp: , Rfl:    atorvastatin (LIPITOR) 40 MG tablet, TAKE 1 TABLET BY MOUTH DAILY. (Patient taking differently: Take 40 mg by mouth every evening.), Disp: 90 tablet, Rfl: 2   azelastine (OPTIVAR) 0.05 % ophthalmic solution, Place 1 drop into both eyes daily as needed for allergies., Disp: , Rfl:    Azelastine HCl 0.15 % SOLN, USE 2 SPRAYS IN EACH NOSTRIL IN THE MORNING AND AT BEDTIME (Patient taking differently: Place 2 sprays into both nostrils 2 (two) times daily as needed (allergies.).), Disp: 30 mL, Rfl: 0  benzonatate (TESSALON PERLES) 100 MG capsule, Take 1 capsule (100 mg total) by mouth 3 (three) times daily as needed., Disp: 30 capsule, Rfl: 0   budesonide-formoterol (SYMBICORT) 160-4.5 MCG/ACT inhaler, Inhale 2 puffs into the lungs 2 (two) times daily. (Patient taking differently: Inhale 2 puffs into the lungs 2 (two) times daily as needed (respiratory issues.).), Disp: 1 each, Rfl: 6   clonazePAM (KLONOPIN) 0.25 MG disintegrating tablet, DISSOLVE ONE TABLET BY MOUTH EVERY DAY AS NEEDED, Disp: 30 tablet, Rfl: 0   EPIPEN 2-PAK 0.3 MG/0.3ML SOAJ injection, Inject 0.3 mg into the muscle as needed for anaphylaxis. , Disp: , Rfl: 1   escitalopram (LEXAPRO) 10 MG tablet, Take 1 tablet (10 mg total) by mouth daily. (Patient taking differently: Take 10 mg by mouth at bedtime.), Disp: 90 tablet, Rfl: 3   estrogen, conjugated,-medroxyprogesterone (PREMPRO) 0.45-1.5 MG per tablet, Take 1 tablet by mouth at bedtime. , Disp: , Rfl:    fluticasone (FLONASE) 50 MCG/ACT nasal spray, Place 2 sprays into both nostrils in the morning and at bedtime., Disp: , Rfl:    ipratropium (ATROVENT) 0.03 % nasal spray, Place 2 sprays into both nostrils every 12 (twelve) hours. (Patient taking differently: Place 2 sprays into both nostrils 2 (two) times daily as needed (allergies.).), Disp: 30 mL, Rfl: 12   lansoprazole (PREVACID) 30  MG capsule, TAKE 1 CAPSULE(30 MG) BY MOUTH TWICE DAILY BEFORE A MEAL, Disp: 180 capsule, Rfl: 0   montelukast (SINGULAIR) 10 MG tablet, Take 1 tablet (10 mg total) by mouth at bedtime., Disp: 90 tablet, Rfl: 3   Nintedanib (OFEV) 150 MG CAPS, Take 1 capsule (150 mg total) by mouth 2 (two) times daily., Disp: 60 capsule, Rfl: 2   Olopatadine HCl 0.7 % SOLN, Place 1 drop into both eyes daily as needed (allergies.). , Disp: , Rfl:    Prenatal Vit-Fe Fumarate-FA (PRENATAL VITAMIN PO), Take 1 tablet by mouth every evening., Disp: , Rfl:    Probiotic Product (PROBIOTIC COLON SUPPORT PO), Take 1 capsule by mouth at bedtime. , Disp: , Rfl:    Spacer/Aero-Holding Chambers (VALVED HOLDING CHAMBER) DEVI, SMARTSIG:Inhalation Via Inhaler As Directed, Disp: , Rfl:    hydroxychloroquine (PLAQUENIL) 200 MG tablet, Take 1 tablet (200 mg total) by mouth daily. (Patient not taking: Reported on 11/20/2021), Disp: 30 tablet, Rfl: 3   polyethylene glycol (MIRALAX / GLYCOLAX) 17 g packet, Take 17 g by mouth in the morning., Disp: , Rfl:    sucralfate (CARAFATE) 1 g tablet, Take 1 tablet (1 g total) by mouth 4 (four) times daily -  with meals and at bedtime., Disp: 60 tablet, Rfl: 0

## 2021-11-21 ENCOUNTER — Telehealth: Payer: Self-pay | Admitting: Gastroenterology

## 2021-11-21 ENCOUNTER — Ambulatory Visit: Payer: PPO | Admitting: Gastroenterology

## 2021-11-21 NOTE — Telephone Encounter (Signed)
Good Morning Dr. Silverio Decamp,  Patient called stating that she missed her appointment with you this morning at 9:50 due to having diarrhea and not being able to get out of the bathroom.  Patient was rescheduled for 10/9 at 10:40

## 2021-11-21 NOTE — Telephone Encounter (Signed)
Okay, thanks

## 2021-11-24 ENCOUNTER — Ambulatory Visit: Payer: PPO | Admitting: Internal Medicine

## 2021-11-24 DIAGNOSIS — M3501 Sicca syndrome with keratoconjunctivitis: Secondary | ICD-10-CM

## 2021-11-24 DIAGNOSIS — Q181 Preauricular sinus and cyst: Secondary | ICD-10-CM

## 2021-11-24 DIAGNOSIS — J849 Interstitial pulmonary disease, unspecified: Secondary | ICD-10-CM

## 2021-11-24 DIAGNOSIS — Z79899 Other long term (current) drug therapy: Secondary | ICD-10-CM

## 2021-11-25 ENCOUNTER — Encounter: Payer: Self-pay | Admitting: Internal Medicine

## 2021-11-25 ENCOUNTER — Ambulatory Visit: Payer: PPO | Attending: Internal Medicine | Admitting: Internal Medicine

## 2021-11-25 VITALS — BP 121/76 | HR 89 | Resp 14 | Ht 63.5 in | Wt 135.8 lb

## 2021-11-25 DIAGNOSIS — Z79899 Other long term (current) drug therapy: Secondary | ICD-10-CM

## 2021-11-25 DIAGNOSIS — J849 Interstitial pulmonary disease, unspecified: Secondary | ICD-10-CM

## 2021-11-25 DIAGNOSIS — M722 Plantar fascial fibromatosis: Secondary | ICD-10-CM

## 2021-11-25 DIAGNOSIS — H04123 Dry eye syndrome of bilateral lacrimal glands: Secondary | ICD-10-CM

## 2021-11-25 DIAGNOSIS — M3500 Sicca syndrome, unspecified: Secondary | ICD-10-CM

## 2021-11-25 DIAGNOSIS — K31819 Angiodysplasia of stomach and duodenum without bleeding: Secondary | ICD-10-CM

## 2021-11-25 DIAGNOSIS — Q181 Preauricular sinus and cyst: Secondary | ICD-10-CM

## 2021-11-25 DIAGNOSIS — K921 Melena: Secondary | ICD-10-CM

## 2021-11-25 NOTE — Progress Notes (Signed)
Office Visit Note  Patient: Sheri Andrews             Date of Birth: 12/19/1950           MRN: [id number]             PCP: [name], MD Referring: [name], * Visit Date: 11/25/2021   Subjective:  Follow-up (Feeling pretty good.)   History of Present Illness: Sheri Andrews is a 71 y.o. female here for follow up sjogren's syndrome with arthralgias, sicca symptoms, and ILD.  Since her last visit she noticed foamy appearance changes to her urine and had greater than 1 g proteinuria.  Hydroxychloroquine was discontinued with improvement of the proteinuria.  However despite this improving her ongoing GI intolerance including a large weight loss this caused her to discontinue the Ofev.  Currently planning to wait a month before starting alternative medication for ILD according to pulmonology planning. Has not had any return of swelling or symptoms bothering her around the salivary glands or preauricular cyst since seeing ENT for this problem earlier.  Previous HPI 08/20/2021 Sheri Andrews is a 71 y.o. 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 Since our last visit she is doing about the same starting Ofev treatment with Dr. Vaughan Browner. She saw Dr. Wilburn Cornelia completed course of antibiotics and had CT imaging of her head no evidence of swelling involvement to her salivary glands or lymph nodes appears more consistent for preauricular cyst.   Previous HPI 06/27/21 Sheri Andrews is a 72 y.o. female here for evaluation of Sjogren's syndrome. She had recent positive ANA and SSA Abs checked associated with new diagnosis of ILD. She has a previous history of moderate persistent asthma, GAVE, and barrett's esophagus. She had December visit with Dr. Erin Fulling for moderate persistent asthma with obstructive PFT findings and HRCT demonstrated definite changes compared to 2021. So far PFTs have not demonstrated any significant  restrictive changes. Besides pulmonary issues she has a longstanding history of dry eyes and mouth complaints. She attributed this to her chronic allergic rhinitis and conjunctivitis and frequent sinusitis infections. She has taken allergy shots and frequent antihistamine medication for these problems. She sees ophthalmology recent exam with cataracts and also has some epiretinal membrane changes in both eyes. She mostly treats dry eyes with artificial tears/systane/refresh. She estimates dry mouth symptoms are since about 20 years ago. Just drinks a lot of fluid to treat this. She has numerous crowns no particular mouth ulcers or lesions. She has had oral thrush but was attributed to steroid inhaler for asthma. She has dysphagia reports inability to eat without drinking fluids between solid foods. 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 Silverio Decamp for this problem. She takes PPI long term for heartburn and cough. She has mild severity of joint pain increased in her hands within the past few years. Does not have prolonged morning stiffness. Sometimes swelling in bilateral hands not associated with numbness or decreased strength. She notices some redness and nodules in distal finger joints. Intermittently swelling in the feet and ankles nothing persistently. She has some progressive 1st MTP bunions and left lateral foot tendonitis. Right cyst in front of right ear this is very chronic she discussed surgical removal with dermatologist a year ago. She sometimes expresses drainage from this during increased swelling. Recently worse with tenderness, swelling, and erythema.  She denies any lymph node swelling, fevers, skin rashes, or raynaud's symptoms. Most recent metabolic panel eGFR 59 no history of problems. Most recent CBC slightly elevated relative lymphocytes 52.6% normal total level.   Labs reviewed ANA 1:40 speckled SSA  6.4 dsDNA, SSB, Scl-70 neg RF neg CCP neg ANCA neg CBC 52.6% lymphs   Review of Systems  Constitutional:  Negative for fatigue.  HENT:  Positive for mouth dryness. Negative for mouth sores.   Eyes:  Positive for dryness.  Respiratory:  Positive for cough. Negative for shortness of breath.   Cardiovascular:  Negative for chest pain and palpitations.  Gastrointestinal:  Negative for blood in stool, constipation and diarrhea.  Endocrine: Negative for increased urination.  Genitourinary:  Negative for involuntary urination.  Musculoskeletal:  Positive for morning stiffness. Negative for joint pain, joint pain, joint swelling, myalgias, muscle weakness, muscle tenderness and myalgias.  Skin:  Positive for sensitivity to sunlight. Negative for color change, rash and hair loss.  Allergic/Immunologic: Negative for susceptible to infections.  Neurological:  Positive for headaches. Negative for dizziness.  Hematological:  Negative for swollen glands.  Psychiatric/Behavioral:  Positive for sleep disturbance. Negative for depressed mood. The patient is nervous/anxious.     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   High risk medication use 06/27/2021   ILD (interstitial lung disease) ([place]) 05/12/2021   Left foot pain 01/24/2021   Frontal headache 11/19/2020   Right epiretinal membrane 01/03/2020   Left epiretinal membrane 01/03/2020   Nuclear sclerotic cataract of right eye 01/03/2020   Nuclear sclerotic cataract of left eye 01/03/2020    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   Anatomical narrow angle of right eye 01/03/2020   GAVE (gastric antral vascular ectasia)    Melena    Iron deficiency anemia due to chronic blood loss    Chronic pansinusitis 06/22/2018   Nasal turbinate hypertrophy 06/22/2018   Asthma 05/25/2018   Essential hypertension 06/29/2017   Routine general medical examination at a health care facility 05/15/2016   Family history of early CAD 03/09/2014   Osteopenia 02/01/2012   BARRETTS ESOPHAGUS 10/08/2008   NEPHROLITHIASIS 04/30/2007   OCD (obsessive compulsive disorder) 04/11/2007   HYPERCHOLESTEROLEMIA 04/08/2007   Gastroesophageal reflux disease 04/08/2007   IRRITABLE BOWEL SYNDROME 04/08/2007    Past Medical History:  Diagnosis Date   Acid reflux disease    Allergic rhinitis    Anxiety    Asthma    Barrett esophagus    Dyspnea    Dysthymia    Hypercholesterolemia    Hypertension    Irritable bowel syndrome    Nephrolithiasis    Osteopenia    Osteoporosis    Other chest pain    Pulmonary fibrosis (HCC)    Sjogren's disease ([place])    Status post dilation of esophageal narrowing     Family History  Problem Relation Age of Onset   Heart disease Mother    Diabetes Mother    Heart disease Father    Diabetes Sister    Heart disease Sister  Diabetes Brother    Heart disease Brother    Heart disease Brother    Cancer Maternal Uncle        type unknown   Throat cancer Paternal Uncle    Colon cancer Neg Hx    Stomach cancer Neg Hx    Rectal cancer Neg Hx    Pancreatic cancer Neg Hx    Past Surgical History:  Procedure Laterality Date   ESOPHAGOGASTRODUODENOSCOPY (EGD) WITH PROPOFOL N/A 05/02/2019   Procedure: ESOPHAGOGASTRODUODENOSCOPY (EGD) WITH PROPOFOL;  Surgeon: Mauri Pole, MD;  Location: [facility];  Service: Endoscopy;  Laterality: N/A;  needing APC available   ESOPHAGOGASTRODUODENOSCOPY (EGD) WITH PROPOFOL N/A 08/11/2021    Procedure: ESOPHAGOGASTRODUODENOSCOPY (EGD) WITH PROPOFOL;  Surgeon: Mauri Pole, MD;  Location: [facility];  Service: Gastroenterology;  Laterality: N/A;   FACIAL COSMETIC SURGERY     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 [place]   Social History   Social History Narrative   Not on file   Immunization History  Administered Date(s) Administered   Fluad Quad(high Dose 65+) 01/21/2021   Influenza Split 02/01/2012   Influenza Whole 01/31/2009   Influenza, High Dose Seasonal PF 02/17/2016, 01/25/2017, 01/23/2019, 01/30/2020, 02/11/2021   Influenza,inj,Quad PF,6+ Mos 03/28/2015   Influenza,inj,quad, With Preservative 01/19/2019   Influenza-Unspecified 02/01/2017   PFIZER(Purple Top)SARS-COV-2 Vaccination 05/25/2019, 06/21/2019, 02/27/2020   Pneumococcal Conjugate-13 03/28/2015   Pneumococcal Polysaccharide-23 11/11/2015, 05/14/2016, 01/30/2020, 02/11/2021   Tdap 08/17/2011   Zoster Recombinat (Shingrix) 07/14/2017, 11/11/2017     Objective: Vital Signs: BP 121/76 (BP Location: Left Arm, Patient Position: Sitting, Cuff Size: Normal)   Pulse 89   Resp 14   Ht 5' 3.5" (1.613 m)   Wt 135 lb 12.8 oz (61.6 kg)   BMI 23.68 kg/m    Physical Exam Cardiovascular:     Rate and Rhythm: Normal rate and regular rhythm.  Pulmonary:     Effort: Pulmonary effort is normal.     Comments: Bilateral inspiratory crackles Musculoskeletal:     Right lower leg: No edema.     Left lower leg: No edema.  Skin:    General: Skin is warm and dry.     Findings: No rash.  Neurological:     Mental Status: She is alert.  Psychiatric:        Mood and Affect: Mood normal.      Musculoskeletal Exam:  Shoulders full ROM no tenderness or swelling Elbows full ROM no tenderness or swelling Wrists full ROM no tenderness or swelling Fingers full ROM, no tenderness, [place]ber[place]'s nodes of right second and third finger Knees full ROM no tenderness or swelling Ankles  full ROM no tenderness or swelling   Investigation: No additional findings.  Imaging: No results found.  Recent Labs: Lab Results  Component Value Date   WBC 7.7 08/29/2021   HGB 13.2 08/29/2021   PLT 224 08/29/2021   NA 140 11/19/2021   K 3.8 11/19/2021   CL 103 11/19/2021   CO2 28 11/19/2021   GLUCOSE 94 11/19/2021   BUN 14 11/19/2021   CREATININE 0.82 11/19/2021   BILITOT 0.5 11/19/2021   ALKPHOS 40 11/19/2021   AST 23 11/19/2021   ALT 20 11/19/2021   PROT 7.2 11/19/2021   ALBUMIN 4.2 11/19/2021   CALCIUM 9.3 11/19/2021   GFRAA  08/11/2008    >60        The eGFR has been calculated using the MDRD equation. This calculation  has not been validated in all clinical situations. eGFR's persistently <60 mL/min signify possible Chronic Kidney Disease.    Speciality Comments: No specialty comments available.  Procedures:  No procedures performed Allergies: Amoxicillin, Wellbutrin [bupropion], Doxycycline hyclate, and Erythromycin ethylsuccinate   Assessment / Plan:     Visit Diagnoses: Sjogren's syndrome, with unspecified organ involvement ([place])  Not having too many symptomatic complaints dry eyes and mouth are being well controlled with conservative treatment and OTC drops as needed.  Would not recommend rechallenging hydroxychloroquine due to associated proteinuria which is uncommon but known side effect.  Considering pulmonary disease involvement would consider mycophenolate for alternative DMARD.  High risk medication use - hydroxychloroquine (PLAQUENIL) 200 MG tablet by mouth daily.  Discontinuing hydroxychloroquine toxicity screening.  She has had recent labs with cell count and metabolic panel that were normal.  ILD (interstitial lung disease) [facility])  Ongoing follow-up with pulmonology clinic is scheduled to see in December for repeat pulmonary function testing and review of CT scan.  Orders: No orders of the defined types were placed in this  encounter.  No orders of the defined types were placed in this encounter.    Follow-Up Instructions: Return in about 3 months (around 02/25/2022) for SS/ILD f/u 49mo.   CCollier Salina MD  Note - This record has been created using [employer]  Chart creation errors have been sought, but may not always  have been located. Such creation errors do not reflect on  the standard of medical care.

## 2021-12-01 DIAGNOSIS — J301 Allergic rhinitis due to pollen: Secondary | ICD-10-CM | POA: Diagnosis not present

## 2021-12-01 DIAGNOSIS — M85852 Other specified disorders of bone density and structure, left thigh: Secondary | ICD-10-CM | POA: Diagnosis not present

## 2021-12-01 DIAGNOSIS — Z780 Asymptomatic menopausal state: Secondary | ICD-10-CM | POA: Diagnosis not present

## 2021-12-01 DIAGNOSIS — J3089 Other allergic rhinitis: Secondary | ICD-10-CM | POA: Diagnosis not present

## 2021-12-01 DIAGNOSIS — J3081 Allergic rhinitis due to animal (cat) (dog) hair and dander: Secondary | ICD-10-CM | POA: Diagnosis not present

## 2021-12-01 DIAGNOSIS — M85851 Other specified disorders of bone density and structure, right thigh: Secondary | ICD-10-CM | POA: Diagnosis not present

## 2021-12-04 DIAGNOSIS — J3089 Other allergic rhinitis: Secondary | ICD-10-CM | POA: Diagnosis not present

## 2021-12-04 DIAGNOSIS — J301 Allergic rhinitis due to pollen: Secondary | ICD-10-CM | POA: Diagnosis not present

## 2021-12-04 DIAGNOSIS — J3081 Allergic rhinitis due to animal (cat) (dog) hair and dander: Secondary | ICD-10-CM | POA: Diagnosis not present

## 2021-12-05 ENCOUNTER — Encounter: Payer: Self-pay | Admitting: Internal Medicine

## 2021-12-05 ENCOUNTER — Other Ambulatory Visit: Payer: Self-pay

## 2021-12-05 ENCOUNTER — Telehealth: Payer: Self-pay | Admitting: Internal Medicine

## 2021-12-05 DIAGNOSIS — I10 Essential (primary) hypertension: Secondary | ICD-10-CM

## 2021-12-05 NOTE — Telephone Encounter (Signed)
Caller & Relationship to patient: Sheri Andrews  Call back number: [phone]  Date of last office visit: 08/29/21  Date of next office visit:   Medication(s) to be refilled: amLODipine (NORVASC) 10 MG tablet   Preferred Pharmacy:  [facility], [place] - [address] Phone:  [phone]  Fax:  [phone]

## 2021-12-08 ENCOUNTER — Other Ambulatory Visit: Payer: Self-pay | Admitting: Internal Medicine

## 2021-12-08 ENCOUNTER — Other Ambulatory Visit: Payer: Self-pay

## 2021-12-08 DIAGNOSIS — I10 Essential (primary) hypertension: Secondary | ICD-10-CM

## 2021-12-08 MED ORDER — AMLODIPINE BESYLATE 10 MG PO TABS: ORAL_TABLET | ORAL | 2 refills | Status: DC

## 2021-12-08 NOTE — Telephone Encounter (Signed)
Pt called to check the status of her refill.

## 2021-12-09 DIAGNOSIS — J3089 Other allergic rhinitis: Secondary | ICD-10-CM | POA: Diagnosis not present

## 2021-12-09 DIAGNOSIS — J301 Allergic rhinitis due to pollen: Secondary | ICD-10-CM | POA: Diagnosis not present

## 2021-12-09 DIAGNOSIS — J3081 Allergic rhinitis due to animal (cat) (dog) hair and dander: Secondary | ICD-10-CM | POA: Diagnosis not present

## 2021-12-14 ENCOUNTER — Encounter: Payer: Self-pay | Admitting: Internal Medicine

## 2021-12-16 DIAGNOSIS — J3081 Allergic rhinitis due to animal (cat) (dog) hair and dander: Secondary | ICD-10-CM | POA: Diagnosis not present

## 2021-12-16 DIAGNOSIS — J301 Allergic rhinitis due to pollen: Secondary | ICD-10-CM | POA: Diagnosis not present

## 2021-12-16 DIAGNOSIS — J3089 Other allergic rhinitis: Secondary | ICD-10-CM | POA: Diagnosis not present

## 2021-12-23 DIAGNOSIS — J3081 Allergic rhinitis due to animal (cat) (dog) hair and dander: Secondary | ICD-10-CM | POA: Diagnosis not present

## 2021-12-23 DIAGNOSIS — J301 Allergic rhinitis due to pollen: Secondary | ICD-10-CM | POA: Diagnosis not present

## 2021-12-23 DIAGNOSIS — J3089 Other allergic rhinitis: Secondary | ICD-10-CM | POA: Diagnosis not present

## 2021-12-24 ENCOUNTER — Ambulatory Visit (INDEPENDENT_AMBULATORY_CARE_PROVIDER_SITE_OTHER): Payer: PPO | Admitting: Pulmonary Disease

## 2021-12-24 ENCOUNTER — Ambulatory Visit: Payer: PPO | Admitting: Pulmonary Disease

## 2021-12-24 ENCOUNTER — Encounter: Payer: Self-pay | Admitting: Pulmonary Disease

## 2021-12-24 VITALS — BP 120/64 | HR 94 | Ht 63.0 in | Wt 136.4 lb

## 2021-12-24 DIAGNOSIS — M3500 Sicca syndrome, unspecified: Secondary | ICD-10-CM | POA: Diagnosis not present

## 2021-12-24 DIAGNOSIS — R809 Proteinuria, unspecified: Secondary | ICD-10-CM | POA: Diagnosis not present

## 2021-12-24 DIAGNOSIS — J849 Interstitial pulmonary disease, unspecified: Secondary | ICD-10-CM | POA: Diagnosis not present

## 2021-12-24 DIAGNOSIS — I129 Hypertensive chronic kidney disease with stage 1 through stage 4 chronic kidney disease, or unspecified chronic kidney disease: Secondary | ICD-10-CM | POA: Diagnosis not present

## 2021-12-24 NOTE — Patient Instructions (Signed)
Follow up in December after your CT Chest scan  We will repeat pulmonary function tests at that time  We will discuss starting Cellcept at that time

## 2021-12-24 NOTE — Progress Notes (Signed)
Synopsis: Referred in January 2022 by Pricilla Holm, MD for Chronic Cough  Subjective:   PATIENT ID: Sheri Andrews GENDER: female DOB: 10/16/1950, MRN: [id number]  HPI  Chief Complaint  Patient presents with   Follow-up    Pt is here for ILD follow up. Pt is thinking that we are changing her medication today. She is to have scan done. And is to be seen again in Dec with pfts.    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   She is feeling much better since stopping Ofev. She feels tired and reports she is not doing as much as she used to. She saw Dr. Johnney Ou of nephrology who is monitoring the proteinuria and is not concerned for underlying kidney disease at this time. She has gained 2lbs since last visit.  OV 11/20/21 She reports 14lbs weight loss, lack of appetite and diarrhea since being on the Ofev. She denies any issues with her breathing at this time. She has follow up with Dr. Benjamine Mola later this month and with nephrology in September.   OV 09/18/21 She met with Dr. Vaughan Browner on 07/09/21 for second opinion. 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 She was started on hydroxychloroquine for the Sjogren's. It was recommended that we consider adding cellcept to her treatment regimen by Dr. Vaughan Browner.    She was started on Ofev in 07/2021. We received message on 5/16 that she is noticing foamy urine so a UA was performed by her PCP which showed elevated protein in the urine, presence of calcium oxalate crystals and elevated microalbumin. We have ordered her for a 24 hour urine collection to quantify the protein in her urine. This is noted for be a very rare side effect from Ofev as discussed  with representatives from the [facility] and my colleagues.   She has tolerated Ofev well without much side effects. She has lost 5lbs and has been trying to lose weight by going to the gym.   Timeline for recent medications and proteinuria: 4/17 - Started Ofev  5/4 - Started hydroxychloroquine 5/7-9 Noted foamy urine 5/11 - UA performed indicating proteinuria 5/24 - Stopped hydroxychloroquine   OV 06/04/21 HRCT Chest was ordered after last visit due to abnormal flow volume loop concerning for either fixed airway obstruction versus variable extrathoracic airway obstruction. CT scan showed some patchy areas of ground glass attenuation, septal thickening and parenchymal banding in the lungs bilaterally, most evident in the mid to lower lungs. Very mild cylindrical traction bronchiectasis and peripheral bronchiolectasis. No frank honeycombing. Inspiratory and expiratory imaging indicates mild air trapping. Final read was concerning for probable usual interstitial pneumonia.   These results were discussed with the patient at her visit with Rexene Edison, NP on 05/12/21 and an inflammatory panel was checked with a positive ANA titer of 1:40 with elevated SSA ab at 6.4. She was referred to rheumatology and has an appointment on 06/27/21 with Dr. Benjamine Mola.   She denies any diffuse joint aches or skin changes. She does have some dry eyes. No family history of pulmonary fibrosis or autoimmune conditions.  OV 04/08/21 She was trialed on Breztri inhaler after last visit and she does report some improvement in her breathing.  She  was seen in video visit on 04/02/2021 for increasing chest tightness, nonproductive cough and some wheezing.  She was provided with steroid taper and noted improvement in her cough and wheezing but she continues to experience chest tightness.  Pulmonary function test today shows moderate obstructive defect with flow volume loop concerning for possible fixed airway obstruction  versus possible variable extrathoracic airway obstruction.  OV 02/24/21 She has been using symbicort 160-4.93mg 2 puffs twice daily but has noticed increase shortness of breath since having covid 19 in June. She was treated outpatient with an antiviral medication. She denies night time awakenings. She is mainly experiencing exertional dyspnea and does notice wheezing when she is walking for exercise.   She continues on medications for her GERD and sinus congestion/drainage with out issues.  OV 05/29/20 [facility] with sinus pressure, headaches, cough and sore throat. She tested negative for Covid 19. Chest radiograph was unremarkable as well as BMP and CBC. She was provided with azelastine nasal spray and discharged home. She complains of cough since her ER visit that is dry. She has congestion with post-nasal drip. She has some sinus congestion and ear discomfort. She has shortness of breath with exertion and with coughing episodes. She has history of hiatal hernia and is on pantoprazole and famotidine for GERD. She reports a substernal chest discomfort that is not worse with exertion or associated with nausea or diaphoresis.   She is taking symbicort 160-4.578m 2 puffs twice daily and does not complain of frequent wheezing. She is on montelukast for asthma and allergies. She is on flonase daily.   She has seen Dr. ShWilburn Cornelian ENT perviously for pansinusitis, last seen in 06/2018.          Past Medical History:  Diagnosis Date   Acid reflux disease    Allergic rhinitis    Anxiety    Asthma    Barrett esophagus    Dyspnea    Dysthymia    Hypercholesterolemia    Hypertension    Irritable bowel syndrome    Nephrolithiasis    Osteopenia    Osteoporosis    Other chest pain    Pulmonary fibrosis (HCC)    S[place]og[place]'s disease ([facility]   Status post dilation of esophageal narrowing      Family History  Problem Relation Age of Onset   Heart disease Mother     Diabetes Mother    Heart disease Father    Diabetes Sister    Heart disease Sister    Diabetes Brother    Heart disease Brother    Heart disease Brother    Cancer Maternal Uncle        type unknown   Throat cancer Paternal Uncle    Colon cancer Neg Hx    Stomach cancer Neg Hx    Rectal cancer Neg Hx    Pancreatic cancer Neg Hx      Social History   Socioeconomic History   Marital status: Married    Spouse name: Not on file   Number of children: 2   Years of education: Not on file   Highest education level: Not on file  Occupational History   Occupation: retired  Tobacco Use   Smoking status: Never   Smokeless tobacco: Never  Vaping Use   Vaping Use: Never used  Substance and Sexual Activity   Alcohol use: Yes    Alcohol/week: 2.0 standard drinks of alcohol    Types: 2 Shots  of liquor per week   Drug use: No   Sexual activity: Yes    Partners: Male  Other Topics Concern   Not on file  Social History Narrative   Not on file   Social Determinants of Health   Financial Resource Strain: Not on file  Food Insecurity: Not on file  Transportation Needs: Not on file  Physical Activity: Not on file  Stress: Not on file  Social Connections: Not on file  Intimate Partner Violence: Not on file     Allergies  Allergen Reactions   Amoxicillin Other (See Comments)    Upset stomach   Wellbutrin [Bupropion] Swelling   Doxycycline Hyclate Other (See Comments)    upset  stomach   Erythromycin Ethylsuccinate Other (See Comments)     upset stomach     Outpatient Medications Prior to Visit  Medication Sig Dispense Refill   albuterol (VENTOLIN HFA) 108 (90 Base) MCG/ACT inhaler 1-2 puff as needed 18 g 3   amLODipine (NORVASC) 10 MG tablet TAKE 1 TABLET(10 MG) BY MOUTH DAILY Strength: 10 mg 90 tablet 2   Artificial Tear Solution (TEARS NATURALE OP) Place 1 drop into both eyes See admin instructions. Use 1 drop into both eyes (scheduled) in the morning & may use 3 times  daily if needed for dry /irritated eyes.     atorvastatin (LIPITOR) 40 MG tablet TAKE 1 TABLET BY MOUTH DAILY. (Patient taking differently: Take 40 mg by mouth every evening.) 90 tablet 2   azelastine (OPTIVAR) 0.05 % ophthalmic solution Place 1 drop into both eyes daily as needed for allergies.     Azelastine HCl 0.15 % SOLN USE 2 SPRAYS IN EACH NOSTRIL IN THE MORNING AND AT BEDTIME (Patient taking differently: Place 2 sprays into both nostrils 2 (two) times daily as needed (allergies.).) 30 mL 0   benzonatate (TESSALON PERLES) 100 MG capsule Take 1 capsule (100 mg total) by mouth 3 (three) times daily as needed. 30 capsule 0   budesonide-formoterol (SYMBICORT) 160-4.5 MCG/ACT inhaler Inhale 2 puffs into the lungs 2 (two) times daily. (Patient taking differently: Inhale 2 puffs into the lungs 2 (two) times daily as needed (respiratory issues.).) 1 each 6   clonazePAM (KLONOPIN) 0.25 MG disintegrating tablet DISSOLVE ONE TABLET BY MOUTH EVERY DAY AS NEEDED 30 tablet 0   EPIPEN 2-PAK 0.3 MG/0.3ML SOAJ injection Inject 0.3 mg into the muscle as needed for anaphylaxis.   1   escitalopram (LEXAPRO) 10 MG tablet Take 1 tablet (10 mg total) by mouth daily. (Patient taking differently: Take 10 mg by mouth at bedtime.) 90 tablet 3   estrogen, conjugated,-medroxyprogesterone (PREMPRO) 0.45-1.5 MG per tablet Take 1 tablet by mouth at bedtime.      fluticasone (FLONASE) 50 MCG/ACT nasal spray Place 2 sprays into both nostrils in the morning and at bedtime.     ipratropium (ATROVENT) 0.03 % nasal spray Place 2 sprays into both nostrils every 12 (twelve) hours. (Patient taking differently: Place 2 sprays into both nostrils 2 (two) times daily as needed (allergies.).) 30 mL 12   lansoprazole (PREVACID) 30 MG capsule TAKE 1 CAPSULE(30 MG) BY MOUTH TWICE DAILY BEFORE A MEAL 180 capsule 0   montelukast (SINGULAIR) 10 MG tablet Take 1 tablet (10 mg total) by mouth at bedtime. 90 tablet 3   Olopatadine HCl 0.7 % SOLN  Place 1 drop into both eyes daily as needed (allergies.).      Prenatal Vit-Fe Fumarate-FA (PRENATAL VITAMIN PO) Take 1 tablet by mouth  every evening.     Probiotic Product (PROBIOTIC COLON SUPPORT PO) Take 1 capsule by mouth at bedtime.      Spacer/Aero-Holding Chambers (VALVED HOLDING CHAMBER) DEVI SMARTSIG:Inhalation Via Inhaler As Directed     No facility-administered medications prior to visit.    Review of Systems  Constitutional:  Negative for chills, fever, malaise/fatigue and weight loss.  HENT:  Negative for congestion, sinus pain and sore throat.   Eyes: Negative.   Respiratory:  Negative for cough, hemoptysis, sputum production and wheezing.   Cardiovascular:  Negative for chest pain, palpitations, orthopnea, claudication and leg swelling.  Gastrointestinal:  Negative for abdominal pain, diarrhea, heartburn, nausea and vomiting.  Genitourinary: Negative.   Musculoskeletal:  Negative for joint pain and myalgias.  Skin:  Negative for rash.  Neurological:  Negative for weakness.  Endo/Heme/Allergies: Negative.   Psychiatric/Behavioral: Negative.      Objective:   Vitals:   12/24/21 1419  BP: 120/64  Pulse: 94  SpO2: 96%  Weight: 136 lb 6.4 oz (61.9 kg)  Height: 5' 3" (1.6 m)   Physical Exam Constitutional:      General: She is not in acute distress.    Appearance: Normal appearance. She is not ill-appearing.  HENT:     Head: Normocephalic and atraumatic.  Cardiovascular:     Rate and Rhythm: Normal rate and regular rhythm.     Pulses: Normal pulses.     Heart sounds: Normal heart sounds. No murmur heard. Pulmonary:     Effort: Pulmonary effort is normal.     Breath sounds: No wheezing, rhonchi or rales.  Musculoskeletal:     Right lower leg: No edema.     Left lower leg: No edema.  Skin:    General: Skin is warm and dry.  Neurological:     General: No focal deficit present.     Mental Status: She is alert.    CBC    Component Value Date/Time   WBC  7.7 08/29/2021 1315   RBC 4.36 08/29/2021 1315   HGB 13.2 08/29/2021 1315   HCT 38.0 08/29/2021 1315   PLT 224 08/29/2021 1315   MCV 87.2 08/29/2021 1315   MCH 30.3 08/29/2021 1315   MCHC 34.7 08/29/2021 1315   RDW 13.3 08/29/2021 1315   LYMPHSABS 2.6 06/19/2021 1239   MONOABS 0.5 06/19/2021 1239   EOSABS 0.1 06/19/2021 1239   BASOSABS 0.0 06/19/2021 1239      Latest Ref Rng & Units 11/19/2021    1:04 PM 09/19/2021   11:01 AM 08/29/2021    1:15 PM  BMP  Glucose 70 - 99 mg/dL 94  95  107   BUN 6 - 23 mg/dL _0 Creatinine 0.40 - 1.20 mg/dL 0.82  0.77  0.90   Sodium 135 - 145 mEq/L 140  139  139   Potassium 3.5 - 5.1 mEq/L 3.8  3.6  3.4   Chloride 96 - 112 mEq/L 103  106  106   CO2 19 - 32 mEq/L _1 Calcium 8.4 - 10.5 mg/dL 9.3  9.3  9.9    Chest imaging: HRCT Chest 04/29/21 1. 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 These findings are clearly progressive compared to prior cardiac CT 01/25/2020. Repeat high-resolution chest CT is suggested in 12 months to assess for temporal changes in the  appearance of the lung parenchyma. 2. Aortic atherosclerosis, in addition to left main and 2 vessel coronary artery disease.   CXR 02/24/21 Increased interstitial markings bilaterally  CXR 04/16/20 The heart size and mediastinal contours are within normal limits. Both lungs are clear. No pleural effusion. The visualized skeletal structures are unremarkable.  PFT:    Latest Ref Rng & Units 04/08/2021   11:06 AM  PFT Results  FVC-Pre L 2.74   FVC-Predicted Pre % 96   FVC-Post L 2.77   FVC-Predicted Post % 97   Pre FEV1/FVC % % 66   Post FEV1/FCV % % 50   FEV1-Pre L 1.82   FEV1-Predicted Pre % 85   FEV1-Post L 1.37   DLCO uncorrected ml/min/mmHg 22.11   DLCO UNC% % 117   DLCO corrected ml/min/mmHg 22.11   DLCO COR  %Predicted % 117   DLVA Predicted % 106   TLC L 5.14   TLC % Predicted % 104   RV % Predicted % 92     Labs:  Path:  Echo:  Heart Catheterization:  Assessment & Plan:   ILD (interstitial lung disease) (HCC)  Discussion: Sheri Andrews is a 71 year old woman, never smoker with history of allergic rhinitis, asthma and GERD who returns to pulmonary clinic for asthma and 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   She was started on Ofev 4/17 and stopped 8/3 due to intolerance with weight loss and GI symptoms. There was also concern for proteinuria but this is felt mostly due to hydroxychloroquine. She is following with nephrology for proteinuria.   We will repeat PFTs and HRCT chest in early December.  Plan will be to start cellcept in the future for her sjogren's related ILD and if tolerated consider adding back antifibrotic therapy in the future.   Follow up in December after HRCT and with PFTs.  Freda Jackson, MD [facility] Office: [phone]      Current Outpatient Medications:    albuterol (VENTOLIN HFA) 108 (90 Base) MCG/ACT inhaler, 1-2 puff as needed, Disp: 18 g, Rfl: 3   amLODipine (NORVASC) 10 MG tablet, TAKE 1 TABLET(10 MG) BY MOUTH DAILY Strength: 10 mg, Disp: 90 tablet, Rfl: 2   Artificial Tear Solution (TEARS NATURALE OP), Place 1 drop into both eyes See admin instructions. Use 1 drop into both eyes (scheduled) in the morning & may use 3 times daily if needed for dry /irritated eyes., Disp: , Rfl:    atorvastatin (LIPITOR) 40 MG tablet, TAKE 1 TABLET BY MOUTH DAILY. (Patient taking differently: Take 40 mg by mouth every evening.), Disp: 90 tablet, Rfl: 2   azelastine (OPTIVAR) 0.05 % ophthalmic solution, Place 1 drop into both eyes daily as needed for allergies., Disp: , Rfl:    Azelastine HCl 0.15 % SOLN, USE 2 SPRAYS IN EACH NOSTRIL IN THE MORNING AND AT BEDTIME (Patient taking differently: Place 2 sprays into  both nostrils 2 (two) times daily as needed (allergies.).), Disp: 30 mL, Rfl: 0   benzonatate (TESSALON PERLES) 100 MG capsule, Take 1 capsule (100 mg total) by mouth 3 (three) times daily as needed., Disp: 30 capsule, Rfl: 0   budesonide-formoterol (SYMBICORT) 160-4.5 MCG/ACT inhaler, Inhale 2 puffs into the lungs 2 (two) times daily. (Patient taking differently: Inhale 2 puffs into the lungs 2 (two) times daily as needed (respiratory issues.).), Disp: 1 each, Rfl: 6   clonazePAM (KLONOPIN) 0.25 MG disintegrating tablet, DISSOLVE ONE TABLET BY MOUTH EVERY DAY AS  NEEDED, Disp: 30 tablet, Rfl: 0   EPIPEN 2-PAK 0.3 MG/0.3ML SOAJ injection, Inject 0.3 mg into the muscle as needed for anaphylaxis. , Disp: , Rfl: 1   escitalopram (LEXAPRO) 10 MG tablet, Take 1 tablet (10 mg total) by mouth daily. (Patient taking differently: Take 10 mg by mouth at bedtime.), Disp: 90 tablet, Rfl: 3   estrogen, conjugated,-medroxyprogesterone (PREMPRO) 0.45-1.5 MG per tablet, Take 1 tablet by mouth at bedtime. , Disp: , Rfl:    fluticasone (FLONASE) 50 MCG/ACT nasal spray, Place 2 sprays into both nostrils in the morning and at bedtime., Disp: , Rfl:    ipratropium (ATROVENT) 0.03 % nasal spray, Place 2 sprays into both nostrils every 12 (twelve) hours. (Patient taking differently: Place 2 sprays into both nostrils 2 (two) times daily as needed (allergies.).), Disp: 30 mL, Rfl: 12   lansoprazole (PREVACID) 30 MG capsule, TAKE 1 CAPSULE(30 MG) BY MOUTH TWICE DAILY BEFORE A MEAL, Disp: 180 capsule, Rfl: 0   montelukast (SINGULAIR) 10 MG tablet, Take 1 tablet (10 mg total) by mouth at bedtime., Disp: 90 tablet, Rfl: 3   Olopatadine HCl 0.7 % SOLN, Place 1 drop into both eyes daily as needed (allergies.). , Disp: , Rfl:    Prenatal Vit-Fe Fumarate-FA (PRENATAL VITAMIN PO), Take 1 tablet by mouth every evening., Disp: , Rfl:    Probiotic Product (PROBIOTIC COLON SUPPORT PO), Take 1 capsule by mouth at bedtime. , Disp: , Rfl:     Spacer/Aero-Holding Chambers (VALVED HOLDING CHAMBER) DEVI, SMARTSIG:Inhalation Via Inhaler As Directed, Disp: , Rfl:

## 2021-12-25 ENCOUNTER — Ambulatory Visit: Payer: PPO | Admitting: Pulmonary Disease

## 2021-12-26 DIAGNOSIS — J3089 Other allergic rhinitis: Secondary | ICD-10-CM | POA: Diagnosis not present

## 2021-12-26 DIAGNOSIS — J301 Allergic rhinitis due to pollen: Secondary | ICD-10-CM | POA: Diagnosis not present

## 2021-12-27 ENCOUNTER — Encounter: Payer: Self-pay | Admitting: Pulmonary Disease

## 2021-12-30 DIAGNOSIS — J3089 Other allergic rhinitis: Secondary | ICD-10-CM | POA: Diagnosis not present

## 2021-12-30 DIAGNOSIS — J301 Allergic rhinitis due to pollen: Secondary | ICD-10-CM | POA: Diagnosis not present

## 2021-12-30 DIAGNOSIS — J3081 Allergic rhinitis due to animal (cat) (dog) hair and dander: Secondary | ICD-10-CM | POA: Diagnosis not present

## 2021-12-30 NOTE — Telephone Encounter (Signed)
Dr. Erin Andrews, pt is asking what vaccines would be helpful for her. I have informed her the flu vaccine and the RSV vaccine would be beneficial for her. She states she will not do COVID booster. Please advise if there are any further recommendations for her. Thank you!!

## 2021-12-31 ENCOUNTER — Encounter: Payer: Self-pay | Admitting: Internal Medicine

## 2021-12-31 DIAGNOSIS — H1013 Acute atopic conjunctivitis, bilateral: Secondary | ICD-10-CM

## 2022-01-02 ENCOUNTER — Other Ambulatory Visit: Payer: Self-pay | Admitting: Internal Medicine

## 2022-01-02 DIAGNOSIS — H1013 Acute atopic conjunctivitis, bilateral: Secondary | ICD-10-CM

## 2022-01-02 MED ORDER — AZELASTINE HCL 0.05 % OP SOLN: 1.0000 [drp] | Freq: Two times a day (BID) | OPHTHALMIC | 0 refills | Status: DC | PRN

## 2022-01-02 NOTE — Telephone Encounter (Signed)
Next Visit: 03/04/2022  Last Visit: 11/25/2021  Last Fill:  Dx: Dry eyes  Current Dose per office note on 11/25/2021: not mentioned in note  Okay to refill azelastine?

## 2022-01-04 ENCOUNTER — Other Ambulatory Visit: Payer: Self-pay | Admitting: Internal Medicine

## 2022-01-06 DIAGNOSIS — J3089 Other allergic rhinitis: Secondary | ICD-10-CM | POA: Diagnosis not present

## 2022-01-06 DIAGNOSIS — J301 Allergic rhinitis due to pollen: Secondary | ICD-10-CM | POA: Diagnosis not present

## 2022-01-06 DIAGNOSIS — J3081 Allergic rhinitis due to animal (cat) (dog) hair and dander: Secondary | ICD-10-CM | POA: Diagnosis not present

## 2022-01-13 DIAGNOSIS — J3081 Allergic rhinitis due to animal (cat) (dog) hair and dander: Secondary | ICD-10-CM | POA: Diagnosis not present

## 2022-01-13 DIAGNOSIS — J301 Allergic rhinitis due to pollen: Secondary | ICD-10-CM | POA: Diagnosis not present

## 2022-01-13 DIAGNOSIS — J3089 Other allergic rhinitis: Secondary | ICD-10-CM | POA: Diagnosis not present

## 2022-01-15 ENCOUNTER — Telehealth: Payer: Self-pay | Admitting: Internal Medicine

## 2022-01-15 NOTE — Telephone Encounter (Signed)
N/A unable to leave a message for patient to call back to schedule Medicare Annual Wellness Visit   Last AWV  01/21/21  Please schedule at anytime with [facility] if patient calls the office back.    Any questions, please call me at [phone]

## 2022-01-23 DIAGNOSIS — R809 Proteinuria, unspecified: Secondary | ICD-10-CM | POA: Diagnosis not present

## 2022-01-26 ENCOUNTER — Ambulatory Visit (INDEPENDENT_AMBULATORY_CARE_PROVIDER_SITE_OTHER)
Admission: RE | Admit: 2022-01-26 | Discharge: 2022-01-26 | Disposition: A | Discharge status: Home / Self Care | Payer: PPO | Source: Ambulatory Visit | Attending: Gastroenterology | Admitting: Gastroenterology

## 2022-01-26 ENCOUNTER — Ambulatory Visit: Payer: PPO | Admitting: Gastroenterology

## 2022-01-26 ENCOUNTER — Encounter: Payer: Self-pay | Admitting: Gastroenterology

## 2022-01-26 VITALS — BP 118/64 | HR 93 | Ht 63.0 in | Wt 136.5 lb

## 2022-01-26 DIAGNOSIS — R0602 Shortness of breath: Secondary | ICD-10-CM

## 2022-01-26 DIAGNOSIS — R059 Cough, unspecified: Secondary | ICD-10-CM | POA: Diagnosis not present

## 2022-01-26 DIAGNOSIS — K219 Gastro-esophageal reflux disease without esophagitis: Secondary | ICD-10-CM

## 2022-01-26 MED ORDER — LANSOPRAZOLE 30 MG PO CPDR: DELAYED_RELEASE_CAPSULE | ORAL | 6 refills | Status: DC

## 2022-01-26 NOTE — Patient Instructions (Signed)
.  We have sent the following medications to your pharmacy for you to pick up at your convenience: Lansoprazole  Your provider has requested that you have an chest x ray before leaving today. Please go to the basement floor to our Radiology department for the test.  ._______________________________________________________  If you are age 72 or older, your body mass index should be between 23-30. Your Body mass index is 24.18 kg/m. If this is out of the aforementioned range listed, please consider follow up with your Primary Care Provider.  If you are age 4 or younger, your body mass index should be between 19-25. Your Body mass index is 24.18 kg/m. If this is out of the aformentioned range listed, please consider follow up with your Primary Care Provider.   ________________________________________________________  The [facility] providers would like to encourage you to use [facility] to communicate with providers for non-urgent requests or questions.  Due to long hold times on the telephone, sending your provider a message by [facility] may be a faster and more efficient way to get a response.  Please allow 48 business hours for a response.  Please remember that this is for non-urgent requests.  _______________________________________________________  Thank you for choosing me and [facility].  Dr. Silverio Decamp

## 2022-01-26 NOTE — Progress Notes (Signed)
Sheri Andrews    [id number]    20-May-1950  Primary Care Physician:Crawford, Real Cons, MD  Referring Physician: [name], MD [address] [place],  [place] [address]   Chief complaint: GERD with erosive gastritis  HPI:  71 year old very pleasant female here for follow-up visit s/p EGD for gastritis.  She continues to have chronic cough.  She has nasal congestion and is worried about possible bronchitis/pneumonia  She is followed by pulmonary for ILD and is scheduled for high-resolution CT chest in December  GERD symptoms are improved on lansoprazole  EGD June 20, 2021 - Z-line regular, 35 cm from the incisors. - Normal esophagus. - Gastric erosions with no bleeding and no stigmata of recent bleeding. - Normal examined duodenum.  Has history of gastric angiectasia, is s/p eradication with argon plasma coagulation in January 2021     Denies any abdominal pain, vomiting, rectal bleeding or dysphagia.     EGD May 02, 2019 - Normal esophagus. - Two non-bleeding angiodysplastic lesions in the stomach. Treated with argon plasma coagulation (APC). - Normal examined duodenum.   EGD 03/08/2019 - Normal esophagus. - Gastric antral vascular ectasia without bleeding. - Normal examined duodenum.   Colonoscopy March 08, 2019  - The perianal and digital rectal examinations were normal. - External and internal hemorrhoids were found during retroflexion. The hemorrhoids were medium-sized. - The exam was otherwise without abnormality.       Outpatient Encounter Medications as of 01/26/2022  Medication Sig   albuterol (VENTOLIN HFA) 108 (90 Base) MCG/ACT inhaler 1-2 puff as needed   amLODipine (NORVASC) 10 MG tablet TAKE 1 TABLET(10 MG) BY MOUTH DAILY Strength: 10 mg   Artificial Tear Solution (TEARS NATURALE OP) Place 1 drop into both eyes See admin instructions. Use 1 drop into both eyes (scheduled) in the morning & may use 3 times daily  if needed for dry /irritated eyes.   atorvastatin (LIPITOR) 40 MG tablet TAKE 1 TABLET BY MOUTH DAILY. (Patient taking differently: Take 40 mg by mouth every evening.)   azelastine (OPTIVAR) 0.05 % ophthalmic solution INSTILL 1 DROP IN BOTH EYES TWICE DAILY AS NEEDED   Azelastine HCl 0.15 % SOLN USE 2 SPRAYS IN EACH NOSTRIL IN THE MORNING AND AT BEDTIME (Patient taking differently: Place 2 sprays into both nostrils 2 (two) times daily as needed (allergies.).)   benzonatate (TESSALON PERLES) 100 MG capsule Take 1 capsule (100 mg total) by mouth 3 (three) times daily as needed.   budesonide-formoterol (SYMBICORT) 160-4.5 MCG/ACT inhaler Inhale 2 puffs into the lungs 2 (two) times daily. (Patient taking differently: Inhale 2 puffs into the lungs 2 (two) times daily as needed (respiratory issues.).)   clonazePAM (KLONOPIN) 0.25 MG disintegrating tablet DISSOLVE ONE TABLET BY MOUTH EVERY DAY AS NEEDED   EPIPEN 2-PAK 0.3 MG/0.3ML SOAJ injection Inject 0.3 mg into the muscle as needed for anaphylaxis.    escitalopram (LEXAPRO) 10 MG tablet TAKE 1 TABLET(10 MG) BY MOUTH DAILY   estrogen, conjugated,-medroxyprogesterone (PREMPRO) 0.45-1.5 MG per tablet Take 1 tablet by mouth at bedtime.    fluticasone (FLONASE) 50 MCG/ACT nasal spray Place 2 sprays into both nostrils in the morning and at bedtime.   ipratropium (ATROVENT) 0.03 % nasal spray Place 2 sprays into both nostrils every 12 (twelve) hours. (Patient taking differently: Place 2 sprays into both nostrils 2 (two) times daily as needed (allergies.).)   lansoprazole (PREVACID) 30 MG capsule TAKE 1 CAPSULE(30  MG) BY MOUTH TWICE DAILY BEFORE A MEAL   montelukast (SINGULAIR) 10 MG tablet Take 1 tablet (10 mg total) by mouth at bedtime.   Prenatal Vit-Fe Fumarate-FA (PRENATAL VITAMIN PO) Take 1 tablet by mouth every evening.   Probiotic Product (PROBIOTIC COLON SUPPORT PO) Take 1 capsule by mouth at bedtime.    Spacer/Aero-Holding Chambers (VALVED HOLDING  CHAMBER) DEVI SMARTSIG:Inhalation Via Inhaler As Directed   No facility-administered encounter medications on file as of 01/26/2022.    Allergies as of 01/26/2022 - Review Complete 01/26/2022  Allergen Reaction Noted   Amoxicillin Other (See Comments) 08/05/2021   Wellbutrin [bupropion] Swelling 06/27/2021   Doxycycline hyclate Other (See Comments) 02/25/2005   Erythromycin ethylsuccinate Other (See Comments) 02/25/2005    Past Medical History:  Diagnosis Date   Acid reflux disease    Allergic rhinitis    Anxiety    Asthma    Barrett esophagus    Dyspnea    Dysthymia    Hypercholesterolemia    Hypertension    Irritable bowel syndrome    Nephrolithiasis    Osteopenia    Osteoporosis    Other chest pain    Pulmonary fibrosis (HCC)    Sjogren's disease ([place])    Status post dilation of esophageal narrowing     Past Surgical History:  Procedure Laterality Date   ESOPHAGOGASTRODUODENOSCOPY (EGD) WITH PROPOFOL N/A 05/02/2019   Procedure: ESOPHAGOGASTRODUODENOSCOPY (EGD) WITH PROPOFOL;  Surgeon: Mauri Pole, MD;  Location: [facility];  Service: Endoscopy;  Laterality: N/A;  needing APC available   ESOPHAGOGASTRODUODENOSCOPY (EGD) WITH PROPOFOL N/A 08/11/2021   Procedure: ESOPHAGOGASTRODUODENOSCOPY (EGD) WITH PROPOFOL;  Surgeon: Mauri Pole, MD;  Location: [facility];  Service: Gastroenterology;  Laterality: N/A;   FACIAL COSMETIC SURGERY     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   TUBAL LIGATION  1986    Family History  Problem Relation Age of Onset   Heart disease Mother    Diabetes Mother    Heart disease Father    Diabetes Sister    Heart disease Sister    Diabetes Brother    Heart disease Brother    Heart disease Brother    Cancer Maternal Uncle        type unknown   Throat cancer Paternal Uncle    Colon cancer Neg Hx    Stomach cancer Neg Hx    Rectal cancer Neg Hx    Pancreatic cancer Neg Hx     Social History    Socioeconomic History   Marital status: Married    Spouse name: Not on file   Number of children: 2   Years of education: Not on file   Highest education level: Not on file  Occupational History   Occupation: retired  Tobacco Use   Smoking status: Never   Smokeless tobacco: Never  Vaping Use   Vaping Use: Never used  Substance and Sexual Activity   Alcohol use: Yes    Alcohol/week: 2.0 standard drinks of alcohol    Types: 2 Shots of liquor per week   Drug use: No   Sexual activity: Yes    Partners: Male  Other Topics Concern   Not on file  Social History Narrative   Not on file   Social Determinants of Health   Financial Resource Strain: Not on file  Food Insecurity: Not on file  Transportation Needs: Not on file  Physical Activity: Not on file  Stress: Not on file  Social  Connections: Not on file  Intimate Partner Violence: Not on file      Review of systems: All other review of systems negative except as mentioned in the HPI.   Physical Exam: Vitals:   01/26/22 1055  BP: 118/64  Pulse: 93   Body mass index is 24.18 kg/m. Gen:      No acute distress HEENT:  sclera anicteric Abd:      soft, non-tender; no palpable masses, no distension S1-S2 Bilateral fine crackles at bases the lungs Ext:    No edema Neuro: alert and oriented x 3 Psych: normal mood and affect  Data Reviewed:  Reviewed labs, radiology imaging, old records and pertinent past GI work up   Assessment and Plan/Recommendations:  71 year old very pleasant female with history of ILD, chronic cough, GERD and erosive gastritis Continue lansoprazole 30 mg twice daily before breakfast and dinner Antireflux measures Avoid NSAIDs  We will obtain 2 view chest x-ray to exclude pneumonia, complaints of worsening cough associated with nasal congestion and mucus production   The patient was provided an opportunity to ask questions and all were answered. The patient agreed with the plan and  demonstrated an understanding of the instructions.  Damaris Hippo , MD    CC: [name], *

## 2022-01-27 ENCOUNTER — Encounter: Payer: Self-pay | Admitting: Gastroenterology

## 2022-01-27 ENCOUNTER — Encounter: Payer: Self-pay | Admitting: Pulmonary Disease

## 2022-01-27 ENCOUNTER — Encounter: Payer: Self-pay | Admitting: Internal Medicine

## 2022-01-28 ENCOUNTER — Telehealth: Payer: Self-pay | Admitting: Gastroenterology

## 2022-01-28 NOTE — Telephone Encounter (Signed)
Patient called looking for someone to go over chest xray results.

## 2022-01-28 NOTE — Telephone Encounter (Signed)
Please review the messages with her PCP and pulmonology. She is reaching out about the CXR result.

## 2022-01-28 NOTE — Telephone Encounter (Signed)
Called patient back and discussed chest x-ray results.  Negative for any consolidation or pneumonia. She had excruciating upper back pain rating to his chest, was in the ER in May 2023, had CT chest at that time with no apparent reason.  Possibly could have had T7 endplate fracture at the time causing the upper back pain radiating to the chest.  She is scheduled for CT high-res chest in December, can further evaluate findings at the time. Patient was very appreciative of the phone call and felt reassured.

## 2022-02-03 DIAGNOSIS — J301 Allergic rhinitis due to pollen: Secondary | ICD-10-CM | POA: Diagnosis not present

## 2022-02-03 DIAGNOSIS — J3089 Other allergic rhinitis: Secondary | ICD-10-CM | POA: Diagnosis not present

## 2022-02-10 DIAGNOSIS — R519 Headache, unspecified: Secondary | ICD-10-CM | POA: Diagnosis not present

## 2022-02-10 DIAGNOSIS — H35373 Puckering of macula, bilateral: Secondary | ICD-10-CM | POA: Diagnosis not present

## 2022-02-10 DIAGNOSIS — H10413 Chronic giant papillary conjunctivitis, bilateral: Secondary | ICD-10-CM | POA: Diagnosis not present

## 2022-02-10 DIAGNOSIS — H25813 Combined forms of age-related cataract, bilateral: Secondary | ICD-10-CM | POA: Diagnosis not present

## 2022-02-10 DIAGNOSIS — J3089 Other allergic rhinitis: Secondary | ICD-10-CM | POA: Diagnosis not present

## 2022-02-10 DIAGNOSIS — J301 Allergic rhinitis due to pollen: Secondary | ICD-10-CM | POA: Diagnosis not present

## 2022-02-10 DIAGNOSIS — J3081 Allergic rhinitis due to animal (cat) (dog) hair and dander: Secondary | ICD-10-CM | POA: Diagnosis not present

## 2022-02-10 DIAGNOSIS — M3500 Sicca syndrome, unspecified: Secondary | ICD-10-CM | POA: Diagnosis not present

## 2022-02-10 LAB — HM DIABETES EYE EXAM

## 2022-02-11 DIAGNOSIS — Z1231 Encounter for screening mammogram for malignant neoplasm of breast: Secondary | ICD-10-CM | POA: Diagnosis not present

## 2022-02-11 DIAGNOSIS — H532 Diplopia: Secondary | ICD-10-CM | POA: Diagnosis not present

## 2022-02-12 ENCOUNTER — Ambulatory Visit (INDEPENDENT_AMBULATORY_CARE_PROVIDER_SITE_OTHER): Payer: PPO | Admitting: Internal Medicine

## 2022-02-12 ENCOUNTER — Ambulatory Visit (INDEPENDENT_AMBULATORY_CARE_PROVIDER_SITE_OTHER): Payer: PPO

## 2022-02-12 VITALS — BP 118/78 | HR 85 | Temp 97.3°F | Ht 63.0 in | Wt 137.0 lb

## 2022-02-12 VITALS — BP 118/78 | HR 85 | Temp 97.3°F | Resp 16 | Ht 63.0 in | Wt 137.2 lb

## 2022-02-12 DIAGNOSIS — I10 Essential (primary) hypertension: Secondary | ICD-10-CM

## 2022-02-12 DIAGNOSIS — E7800 Pure hypercholesterolemia, unspecified: Secondary | ICD-10-CM | POA: Diagnosis not present

## 2022-02-12 DIAGNOSIS — Z0000 Encounter for general adult medical examination without abnormal findings: Secondary | ICD-10-CM

## 2022-02-12 DIAGNOSIS — Z23 Encounter for immunization: Secondary | ICD-10-CM | POA: Diagnosis not present

## 2022-02-12 DIAGNOSIS — R82998 Other abnormal findings in urine: Secondary | ICD-10-CM | POA: Diagnosis not present

## 2022-02-12 DIAGNOSIS — K219 Gastro-esophageal reflux disease without esophagitis: Secondary | ICD-10-CM

## 2022-02-12 DIAGNOSIS — F32A Depression, unspecified: Secondary | ICD-10-CM

## 2022-02-12 DIAGNOSIS — R809 Proteinuria, unspecified: Secondary | ICD-10-CM | POA: Diagnosis not present

## 2022-02-12 LAB — URINALYSIS, ROUTINE W REFLEX MICROSCOPIC
Bilirubin Urine: NEGATIVE
Ketones, ur: NEGATIVE
Leukocytes,Ua: NEGATIVE
Nitrite: NEGATIVE
RBC / HPF: NONE SEEN (ref 0–?)
Specific Gravity, Urine: 1.025 (ref 1.000–1.030)
Total Protein, Urine: 30 — AB
Urine Glucose: NEGATIVE
Urobilinogen, UA: 0.2 (ref 0.0–1.0)
pH: 6 (ref 5.0–8.0)

## 2022-02-12 LAB — MICROALBUMIN / CREATININE URINE RATIO
Creatinine,U: 197.5 mg/dL
Microalb Creat Ratio: 13.3 mg/g (ref 0.0–30.0)
Microalb, Ur: 26.3 mg/dL — ABNORMAL HIGH (ref 0.0–1.9)

## 2022-02-12 MED ORDER — CLONAZEPAM 0.25 MG PO TBDP: ORAL_TABLET | ORAL | 3 refills | Status: DC

## 2022-02-12 MED ORDER — LOSARTAN POTASSIUM 50 MG PO TABS: 50.0000 mg | ORAL_TABLET | Freq: Every day | ORAL | 3 refills | Status: DC

## 2022-02-12 MED ORDER — MONTELUKAST SODIUM 10 MG PO TABS: 10.0000 mg | ORAL_TABLET | Freq: Every day | ORAL | 3 refills | Status: AC

## 2022-02-12 MED ORDER — ATORVASTATIN CALCIUM 40 MG PO TABS: 40.0000 mg | ORAL_TABLET | Freq: Every day | ORAL | 2 refills | Status: DC

## 2022-02-12 NOTE — Patient Instructions (Addendum)
Sheri Andrews , Thank you for taking time to come for your Medicare Wellness Visit. I appreciate your ongoing commitment to your health goals. Please review the following plan we discussed and let me know if I can assist you in the future.   These are the goals we discussed:  Goals      My goal is to get pulmonary fibrosis under control, get to gym on a regular basis and keep walking.        This is a list of the screening recommended for you and due dates:  Health Maintenance  Topic Date Due   COVID-19 Vaccine (4 - Pfizer risk series) 04/23/2020   Tetanus Vaccine  08/16/2021   Flu Shot  11/18/2021   Medicare Annual Wellness Visit  02/20/2022   Mammogram  07/31/2022   Colon Cancer Screening  03/07/2029   Pneumonia Vaccine  Completed   DEXA scan (bone density measurement)  Completed   Hepatitis C Screening: USPSTF Recommendation to screen - Ages 25-79 yo.  Completed   Zoster (Shingles) Vaccine  Completed   HPV Vaccine  Aged Out    Advanced directives: Yes  Conditions/risks identified: Yes  Next appointment: Follow up in one year for your annual wellness visit.   Preventive Care 65 Years and Older, Female Preventive care refers to lifestyle choices and visits with your health care provider that can promote health and wellness. What does preventive care include? A yearly physical exam. This is also called an annual well check. Dental exams once or twice a year. Routine eye exams. Ask your health care provider how often you should have your eyes checked. Personal lifestyle choices, including: Daily care of your teeth and gums. Regular physical activity. Eating a healthy diet. Avoiding tobacco and drug use. Limiting alcohol use. Practicing safe sex. Taking low-dose aspirin every day. Taking vitamin and mineral supplements as recommended by your health care provider. What happens during an annual well check? The services and screenings done by your health care provider during  your annual well check will depend on your age, overall health, lifestyle risk factors, and family history of disease. Counseling  Your health care provider may ask you questions about your: Alcohol use. Tobacco use. Drug use. Emotional well-being. Home and relationship well-being. Sexual activity. Eating habits. History of falls. Memory and ability to understand (cognition). Work and work Statistician. Reproductive health. Screening  You may have the following tests or measurements: Height, weight, and BMI. Blood pressure. Lipid and cholesterol levels. These may be checked every 5 years, or more frequently if you are over 47 years old. Skin check. Lung cancer screening. You may have this screening every year starting at age 9 if you have a 30-pack-year history of smoking and currently smoke or have quit within the past 15 years. Fecal occult blood test (FOBT) of the stool. You may have this test every year starting at age 57. Flexible sigmoidoscopy or colonoscopy. You may have a sigmoidoscopy every 5 years or a colonoscopy every 10 years starting at age 79. Hepatitis C blood test. Hepatitis B blood test. Sexually transmitted disease (STD) testing. Diabetes screening. This is done by checking your blood sugar (glucose) after you have not eaten for a while (fasting). You may have this done every 1-3 years. Bone density scan. This is done to screen for osteoporosis. You may have this done starting at age 83. Mammogram. This may be done every 1-2 years. Talk to your health care provider about how often you should  have regular mammograms. Talk with your health care provider about your test results, treatment options, and if necessary, the need for more tests. Vaccines  Your health care provider may recommend certain vaccines, such as: Influenza vaccine. This is recommended every year. Tetanus, diphtheria, and acellular pertussis (Tdap, Td) vaccine. You may need a Td booster every 10  years. Zoster vaccine. You may need this after age 23. Pneumococcal 13-valent conjugate (PCV13) vaccine. One dose is recommended after age 54. Pneumococcal polysaccharide (PPSV23) vaccine. One dose is recommended after age 65. Talk to your health care provider about which screenings and vaccines you need and how often you need them. This information is not intended to replace advice given to you by your health care provider. Make sure you discuss any questions you have with your health care provider. Document Released: 05/03/2015 Document Revised: 12/25/2015 Document Reviewed: 02/05/2015 Elsevier Interactive Patient Education  2017 [place] Prevention in the Home Falls can cause injuries. They can happen to people of all ages. There are many things you can do to make your home safe and to help prevent falls. What can I do on the outside of my home? Regularly fix the edges of walkways and driveways and fix any cracks. Remove anything that might make you trip as you walk through a door, such as a raised step or threshold. Trim any bushes or trees on the path to your home. Use bright outdoor lighting. Clear any walking paths of anything that might make someone trip, such as rocks or tools. Regularly check to see if handrails are loose or broken. Make sure that both sides of any steps have handrails. Any raised decks and porches should have guardrails on the edges. Have any leaves, snow, or ice cleared regularly. Use sand or salt on walking paths during winter. Clean up any spills in your garage right away. This includes oil or grease spills. What can I do in the bathroom? Use night lights. Install grab bars by the toilet and in the tub and shower. Do not use towel bars as grab bars. Use non-skid mats or decals in the tub or shower. If you need to sit down in the shower, use a plastic, non-slip stool. Keep the floor dry. Clean up any water that spills on the floor as soon as it  happens. Remove soap buildup in the tub or shower regularly. Attach bath mats securely with double-sided non-slip rug tape. Do not have throw rugs and other things on the floor that can make you trip. What can I do in the bedroom? Use night lights. Make sure that you have a light by your bed that is easy to reach. Do not use any sheets or blankets that are too big for your bed. They should not hang down onto the floor. Have a firm chair that has side arms. You can use this for support while you get dressed. Do not have throw rugs and other things on the floor that can make you trip. What can I do in the kitchen? Clean up any spills right away. Avoid walking on wet floors. Keep items that you use a lot in easy-to-reach places. If you need to reach something above you, use a strong step stool that has a grab bar. Keep electrical cords out of the way. Do not use floor polish or wax that makes floors slippery. If you must use wax, use non-skid floor wax. Do not have throw rugs and other things on the floor  that can make you trip. What can I do with my stairs? Do not leave any items on the stairs. Make sure that there are handrails on both sides of the stairs and use them. Fix handrails that are broken or loose. Make sure that handrails are as long as the stairways. Check any carpeting to make sure that it is firmly attached to the stairs. Fix any carpet that is loose or worn. Avoid having throw rugs at the top or bottom of the stairs. If you do have throw rugs, attach them to the floor with carpet tape. Make sure that you have a light switch at the top of the stairs and the bottom of the stairs. If you do not have them, ask someone to add them for you. What else can I do to help prevent falls? Wear shoes that: Do not have high heels. Have rubber bottoms. Are comfortable and fit you well. Are closed at the toe. Do not wear sandals. If you use a stepladder: Make sure that it is fully opened.  Do not climb a closed stepladder. Make sure that both sides of the stepladder are locked into place. Ask someone to hold it for you, if possible. Clearly mark and make sure that you can see: Any grab bars or handrails. First and last steps. Where the edge of each step is. Use tools that help you move around (mobility aids) if they are needed. These include: Canes. Walkers. Scooters. Crutches. Turn on the lights when you go into a dark area. Replace any light bulbs as soon as they burn out. Set up your furniture so you have a clear path. Avoid moving your furniture around. If any of your floors are uneven, fix them. If there are any pets around you, be aware of where they are. Review your medicines with your doctor. Some medicines can make you feel dizzy. This can increase your chance of falling. Ask your doctor what other things that you can do to help prevent falls. This information is not intended to replace advice given to you by your health care provider. Make sure you discuss any questions you have with your health care provider. Document Released: 01/31/2009 Document Revised: 09/12/2015 Document Reviewed: 05/11/2014 Elsevier Interactive Patient Education  2017 [employer].

## 2022-02-12 NOTE — Progress Notes (Signed)
   Subjective:   Patient ID: Sheri Andrews, female    DOB: Dec 18, 1950, 71 y.o.   MRN: [id number]  HPI The patient is a 71 YO female coming in for physical.  PMH, [place], social history reviewed and updated  Review of Systems  Constitutional: Negative.   HENT: Negative.    Eyes: Negative.   Respiratory:  Negative for cough, chest tightness and shortness of breath.   Cardiovascular:  Negative for chest pain, palpitations and leg swelling.  Gastrointestinal:  Negative for abdominal distention, abdominal pain, constipation, diarrhea, nausea and vomiting.  Musculoskeletal: Negative.   Skin: Negative.   Neurological: Negative.   Psychiatric/Behavioral: Negative.      Objective:  Physical Exam Constitutional:      Appearance: She is well-developed.  HENT:     Head: Normocephalic and atraumatic.  Cardiovascular:     Rate and Rhythm: Normal rate and regular rhythm.  Pulmonary:     Effort: Pulmonary effort is normal. No respiratory distress.     Breath sounds: Normal breath sounds. No wheezing or rales.  Abdominal:     General: Bowel sounds are normal. There is no distension.     Palpations: Abdomen is soft.     Tenderness: There is no abdominal tenderness. There is no rebound.  Musculoskeletal:     Cervical back: Normal range of motion.  Skin:    General: Skin is warm and dry.  Neurological:     Mental Status: She is alert and oriented to person, place, and time.     Coordination: Coordination normal.     Vitals:   02/12/22 1047  BP: 118/78  Pulse: 85  Temp: (!) 97.3 F (36.3 C)  TempSrc: Oral  SpO2: 97%  Weight: 137 lb (62.1 kg)  Height: '5\' 3"'$  (1.6 m)    Assessment & Plan:

## 2022-02-12 NOTE — Progress Notes (Signed)
Subjective:   Sheri Andrews is a 71 y.o. female who presents for Medicare Annual (Subsequent) preventive examination.  Review of Systems     Cardiac Risk Factors include: advanced age (>41mn, >>16women);dyslipidemia;family history of premature cardiovascular disease;hypertension     Objective:    Today's Vitals   02/12/22 1004  BP: 118/78  Pulse: 85  Resp: 16  Temp: (!) 97.3 F (36.3 C)  SpO2: 97%  Weight: 137 lb 3.2 oz (62.2 kg)  Height: '5\' 3"'$  (1.6 m)  PainSc: 0-No pain   Body mass index is 24.3 kg/m.     02/12/2022   10:44 AM 08/29/2021    1:12 PM 08/11/2021    9:47 AM 05/02/2019    7:50 AM 04/01/2015    2:48 PM  Advanced Directives  Does Patient Have a Medical Advance Directive? Yes Yes No No Yes  Type of [place]     [place] of [place]? No - copy requested    No - copy requested  Would patient like information on creating a medical advance directive?    Yes (MAU/Ambulatory/Procedural Areas - Information given)     Current Medications (verified) Outpatient Encounter Medications as of 02/12/2022  Medication Sig   albuterol (VENTOLIN HFA) 108 (90 Base) MCG/ACT inhaler 1-2 puff as needed   amLODipine (NORVASC) 10 MG tablet TAKE 1 TABLET(10 MG) BY MOUTH DAILY Strength: 10 mg   Artificial Tear Solution (TEARS NATURALE OP) Place 1 drop into both eyes See admin instructions. Use 1 drop into both eyes (scheduled) in the morning & may use 3 times daily if needed for dry /irritated eyes.   atorvastatin (LIPITOR) 40 MG tablet TAKE 1 TABLET BY MOUTH DAILY. (Patient taking differently: Take 40 mg by mouth every evening.)   azelastine (OPTIVAR) 0.05 % ophthalmic solution INSTILL 1 DROP IN BOTH EYES TWICE DAILY AS NEEDED   Azelastine HCl 0.15 % SOLN USE 2 SPRAYS IN EACH NOSTRIL IN THE MORNING AND AT BEDTIME (Patient taking differently: Place 2 sprays into both  nostrils 2 (two) times daily as needed (allergies.).)   benzonatate (TESSALON PERLES) 100 MG capsule Take 1 capsule (100 mg total) by mouth 3 (three) times daily as needed.   budesonide-formoterol (SYMBICORT) 160-4.5 MCG/ACT inhaler Inhale 2 puffs into the lungs 2 (two) times daily. (Patient taking differently: Inhale 2 puffs into the lungs 2 (two) times daily as needed (respiratory issues.).)   clonazePAM (KLONOPIN) 0.25 MG disintegrating tablet DISSOLVE ONE TABLET BY MOUTH EVERY DAY AS NEEDED   EPIPEN 2-PAK 0.3 MG/0.3ML SOAJ injection Inject 0.3 mg into the muscle as needed for anaphylaxis.    escitalopram (LEXAPRO) 10 MG tablet TAKE 1 TABLET(10 MG) BY MOUTH DAILY   estrogen, conjugated,-medroxyprogesterone (PREMPRO) 0.45-1.5 MG per tablet Take 1 tablet by mouth at bedtime.    fluticasone (FLONASE) 50 MCG/ACT nasal spray Place 2 sprays into both nostrils in the morning and at bedtime.   ipratropium (ATROVENT) 0.03 % nasal spray Place 2 sprays into both nostrils every 12 (twelve) hours. (Patient taking differently: Place 2 sprays into both nostrils 2 (two) times daily as needed (allergies.).)   lansoprazole (PREVACID) 30 MG capsule TAKE 1 CAPSULE(30 MG) BY MOUTH TWICE DAILY BEFORE A MEAL   losartan (COZAAR) 50 MG tablet Take 50 mg by mouth daily.   montelukast (SINGULAIR) 10 MG tablet Take 1 tablet (10 mg total) by mouth at bedtime.   Prenatal Vit-Fe  Fumarate-FA (PRENATAL VITAMIN PO) Take 1 tablet by mouth every evening.   Probiotic Product (PROBIOTIC COLON SUPPORT PO) Take 1 capsule by mouth at bedtime.    Spacer/Aero-Holding Chambers (VALVED HOLDING CHAMBER) DEVI SMARTSIG:Inhalation Via Inhaler As Directed   No facility-administered encounter medications on file as of 02/12/2022.    Allergies (verified) Amoxicillin, Wellbutrin [bupropion], Doxycycline hyclate, and Erythromycin ethylsuccinate   History: Past Medical History:  Diagnosis Date   Acid reflux disease    Allergic rhinitis     Anxiety    Asthma    Barrett esophagus    Dyspnea    Dysthymia    Hypercholesterolemia    Hypertension    Irritable bowel syndrome    Nephrolithiasis    Osteopenia    Osteoporosis    Other chest pain    Pulmonary fibrosis (HCC)    Sjogren's disease (HCC)    Status post dilation of esophageal narrowing    Past Surgical History:  Procedure Laterality Date   ESOPHAGOGASTRODUODENOSCOPY (EGD) WITH PROPOFOL N/A 05/02/2019   Procedure: ESOPHAGOGASTRODUODENOSCOPY (EGD) WITH PROPOFOL;  Surgeon: Mauri Pole, MD;  Location: [facility];  Service: Endoscopy;  Laterality: N/A;  needing APC available   ESOPHAGOGASTRODUODENOSCOPY (EGD) WITH PROPOFOL N/A 08/11/2021   Procedure: ESOPHAGOGASTRODUODENOSCOPY (EGD) WITH PROPOFOL;  Surgeon: Mauri Pole, MD;  Location: [facility];  Service: Gastroenterology;  Laterality: N/A;   FACIAL COSMETIC SURGERY     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   TUBAL LIGATION  1986   Family History  Problem Relation Age of Onset   Heart disease Mother    Diabetes Mother    Heart disease Father    Diabetes Sister    Heart disease Sister    Diabetes Brother    Heart disease Brother    Heart disease Brother    Cancer Maternal Uncle        type unknown   Throat cancer Paternal Uncle    Colon cancer Neg Hx    Stomach cancer Neg Hx    Rectal cancer Neg Hx    Pancreatic cancer Neg Hx    Social History   Socioeconomic History   Marital status: Married    Spouse name: Not on file   Number of children: 2   Years of education: Not on file   Highest education level: Not on file  Occupational History   Occupation: retired  Tobacco Use   Smoking status: Never   Smokeless tobacco: Never  Vaping Use   Vaping Use: Never used  Substance and Sexual Activity   Alcohol use: Yes    Alcohol/week: 2.0 standard drinks of alcohol    Types: 2 Shots of liquor per week   Drug use: No   Sexual activity: Yes    Partners: Male  Other  Topics Concern   Not on file  Social History Narrative   Not on file   Social Determinants of Health   Financial Resource Strain: Low Risk  (02/12/2022)   Overall Financial Resource Strain (CARDIA)    Difficulty of Paying Living Expenses: Not hard at all  Food Insecurity: No Food Insecurity (02/12/2022)   Hunger Vital Sign    Worried About Running Out of Food in the Last Year: Never true    Ran Out of Food in the Last Year: Never true  Transportation Needs: No Transportation Needs (02/12/2022)   PRAPARE - Hydrologist (Medical): No    Lack of Transportation (Non-Medical): No  Physical Activity: Sufficiently Active (02/12/2022)   Exercise Vital Sign    Days of Exercise per Week: 7 days    Minutes of Exercise per Session: 30 min  Stress: No Stress Concern Present (02/12/2022)   [place]    Feeling of Stress : Not at all  Social Connections: [place] (02/12/2022)   Social Connection and Isolation Panel [NHANES]    Frequency of Communication with Friends and Family: More than three times a week    Frequency of Social Gatherings with Friends and Family: More than three times a week    Attends Religious Services: More than 4 times per year    Active Member of [employer] or Organizations: Yes    Attends Music therapist: More than 4 times per year    Marital Status: Married    Tobacco Counseling Counseling given: Not Answered   Clinical Intake:  Pre-visit preparation completed: Yes  Pain : No/denies pain Pain Score: 0-No pain     BMI - recorded: 24.3 Nutritional Status: BMI of 19-24  Normal Nutritional Risks: None Diabetes: No  How often do you need to have someone help you when you read instructions, pamphlets, or other written materials from your doctor or pharmacy?: 1 - Never What is the last grade level you completed in school?: Bachelor's Degree in  Education  Diabetic? no  Interpreter Needed?: No  Information entered by :: Lisette Abu, LPN.   Activities of Daily Living    02/12/2022   10:45 AM  In your present state of health, do you have any difficulty performing the following activities:  Hearing? 0  Vision? 0  Difficulty concentrating or making decisions? 0  Walking or climbing stairs? 0  Dressing or bathing? 0  Doing errands, shopping? 0  Preparing Food and eating ? N  Using the Toilet? N  In the past six months, have you accidently leaked urine? N  Do you have problems with loss of bowel control? N  Managing your Medications? N  Managing your Finances? N  Housekeeping or managing your Housekeeping? N    Patient Care Team: [name], MD as PCP - General (Internal Medicine) Charlton Haws, [facility] (Pharmacist) Charlton Haws, [facility] as Pharmacist (Pharmacist) Warden Fillers, MD as Consulting Physician (Ophthalmology) Zadie Rhine Clent Demark, MD as Consulting Physician (Ophthalmology)  Indicate any recent Medical Services you may have received from other than [facility] providers in the past year (date may be approximate).     Assessment:   This is a routine wellness examination for Aaliyana.  Hearing/Vision screen Hearing Screening - Comments:: Denies hearing difficulties   Vision Screening - Comments:: Wears rx glasses - up to date with routine eye exams with Warden Fillers, MD and Samuella Cota, MD.   Dietary issues and exercise activities discussed: Current Exercise Habits: Home exercise routine;Structured exercise class, Type of exercise: walking;treadmill;stretching;strength training/weights;exercise ball;calisthenics;yoga, Time (Minutes): 30, Frequency (Times/Week): 7, Weekly Exercise (Minutes/Week): 210, Intensity: Moderate, Exercise limited by: respiratory conditions(s);cardiac condition(s)   Goals Addressed             This Visit's Progress    My goal is to get pulmonary fibrosis  under control, get to gym on a regular basis and keep walking.        Depression Screen    02/12/2022   10:49 AM 02/12/2022   10:05 AM 08/29/2021    9:49 AM 11/24/2019   11:14 AM 06/28/2017   10:49 AM  PHQ 2/9 Scores  PHQ - 2 Score 0 0 1 0 0  PHQ- 9 Score 0  9      Fall Risk    02/12/2022   10:20 AM 08/29/2021    9:49 AM 07/18/2020    4:12 PM 03/15/2019    9:47 AM 11/21/2018    5:07 PM  [place] in the past year? 0 1 1 0   Comment    Emm[employer] Telephone Survey: data to providers prior to load [employer] Survey: data to providers prior to load  Number falls in past yr: 0 1 0    Comment     Emmi Telephone Survey Actual Response =   Injury with Fall? 0 0 1    Comment   bruise on left arm    Risk for fall due to : No Fall Risks      Follow up Falls prevention discussed        [place]:  Any stairs in or around the home? Yes  If so, are there any without handrails? No  Home free of loose throw rugs in walkways, pet beds, electrical cords, etc? Yes  Adequate lighting in your home to reduce risk of falls? Yes   ASSISTIVE DEVICES UTILIZED TO PREVENT FALLS:  Life alert? No  Use of a cane, walker or w/c? No  Grab bars in the bathroom? No  Shower chair or bench in shower? Yes  Elevated toilet seat or a handicapped toilet? No   TIMED UP AND GO:  Was the test performed? Yes .  Length of time to ambulate 10 feet: 6 sec.   Gait steady and fast without use of assistive device  Cognitive Function:        02/12/2022   10:16 AM  6CIT Screen  What Year? 0 points  What month? 0 points  What time? 0 points  Count back from 20 0 points  Months in reverse 0 points  Repeat phrase 0 points  Total Score 0 points    Immunizations Immunization History  Administered Date(s) Administered   Fluad Quad(high Dose 65+) 01/21/2021   Influenza Split 02/01/2012   Influenza Whole 01/31/2009   Influenza, High Dose Seasonal PF 02/17/2016,  01/25/2017, 01/23/2019, 01/30/2020, 02/11/2021   Influenza,inj,Quad PF,6+ Mos 03/28/2015   Influenza,inj,quad, With Preservative 01/19/2019   Influenza-Unspecified 02/01/2017   PFIZER(Purple Top)SARS-COV-2 Vaccination 05/25/2019, 06/21/2019, 02/27/2020   Pneumococcal Conjugate-13 03/28/2015   Pneumococcal Polysaccharide-23 11/11/2015, 05/14/2016, 01/30/2020, 02/11/2021   Tdap 08/17/2011   Zoster Recombinat (Shingrix) 07/14/2017, 11/11/2017    TDAP status: Due, Education has been provided regarding the importance of this vaccine. Advised may receive this vaccine at local pharmacy or Health Dept. Aware to provide a copy of the vaccination record if obtained from local pharmacy or Health Dept. Verbalized acceptance and understanding.  Flu Vaccine status: Completed at today's visit  Pneumococcal vaccine status: Up to date  Covid-19 vaccine status: Completed vaccines  Qualifies for Shingles Vaccine? Yes   Zostavax completed No   Shingrix Completed?: Yes  Screening Tests Health Maintenance  Topic Date Due   COVID-19 Vaccine (4 - Pfizer risk series) 04/23/2020   TETANUS/TDAP  08/16/2021   INFLUENZA VACCINE  11/18/2021   MAMMOGRAM  07/31/2022   Medicare Annual Wellness (AWV)  03/15/2023   COLONOSCOPY (Pts 45-9yr Insurance coverage will need to be confirmed)  03/07/2029   Pneumonia Vaccine 71 Years old  Completed   DEXA SCAN  Completed  Hepatitis C Screening  Completed   Zoster Vaccines- Shingrix  Completed   HPV VACCINES  Aged Out    Health Maintenance  Health Maintenance Due  Topic Date Due   COVID-19 Vaccine (4 - Pfizer risk series) 04/23/2020   TETANUS/TDAP  08/16/2021   INFLUENZA VACCINE  11/18/2021    Colorectal cancer screening: Type of screening: Colonoscopy. Completed 03/08/2019. Repeat every 10 years  Mammogram status: Completed 02/11/2022. Repeat every year  Bone Density status: Completed 07/13/2019. Results reflect: Bone density results: OSTEOPENIA. Repeat  every 2-3 years.  Lung Cancer Screening: (Low Dose CT Chest recommended if Age 103-80 years, 30 pack-year currently smoking OR have quit w/in 15years.) does not qualify.   Lung Cancer Screening Referral: no  Additional Screening:  Hepatitis C Screening: does qualify; Completed 05/14/2016  Vision Screening: Recommended annual ophthalmology exams for early detection of glaucoma and other disorders of the eye. Is the patient up to date with their annual eye exam?  Yes  Who is the provider or what is the name of the office in which the patient attends annual eye exams? Warden Fillers, MD and Deloria Lair, MD. If pt is not established with a provider, would they like to be referred to a provider to establish care? No .   Dental Screening: Recommended annual dental exams for proper oral hygiene  Community Resource Referral / Chronic Care Management: CRR required this visit?  No   CCM required this visit?  No      Plan:     I have personally reviewed and noted the following in the patient's chart:   Medical and social history Use of alcohol, tobacco or illicit drugs  Current medications and supplements including opioid prescriptions. Patient is not currently taking opioid prescriptions. Functional ability and status Nutritional status Physical activity Advanced directives List of other physicians Hospitalizations, surgeries, and ER visits in previous 12 months Vitals Screenings to include cognitive, depression, and falls Referrals and appointments  In addition, I have reviewed and discussed with patient certain preventive protocols, quality metrics, and best practice recommendations. A written personalized care plan for preventive services as well as general preventive health recommendations were provided to patient.     Sheral Flow, LPN   55/20/8022   Nurse Notes: N/A

## 2022-02-13 ENCOUNTER — Encounter: Payer: Self-pay | Admitting: Internal Medicine

## 2022-02-13 NOTE — Assessment & Plan Note (Signed)
Taking prevacid 30 mg daily and will continue. Controlled.

## 2022-02-13 NOTE — Assessment & Plan Note (Signed)
Continue lipitor and refilled at visit. Recent lipid at goal.

## 2022-02-13 NOTE — Assessment & Plan Note (Signed)
BP at goal refill losartan 50 mg daily and continue amlodipine 10 mg daily.

## 2022-02-13 NOTE — Assessment & Plan Note (Signed)
Flu shot given at [facility]. Pneumonia complete. Shingrix complete. Tetanus due at pharmacy. Colonoscopy up to date. Mammogram up to date, pap smear aged out and dexa complete. Counseled about sun safety and mole surveillance. Counseled about the dangers of distracted driving. Given 10 year screening recommendations.

## 2022-02-13 NOTE — Assessment & Plan Note (Signed)
Amount of proteinuria is decreasing. Checking U/A and microalbumin to creatinine ratio.

## 2022-02-13 NOTE — Assessment & Plan Note (Signed)
Worsening and we have increased lexapro to 20 mg daily. Will do new rx on refill if effective.

## 2022-02-17 DIAGNOSIS — J3089 Other allergic rhinitis: Secondary | ICD-10-CM | POA: Diagnosis not present

## 2022-02-17 DIAGNOSIS — J3081 Allergic rhinitis due to animal (cat) (dog) hair and dander: Secondary | ICD-10-CM | POA: Diagnosis not present

## 2022-02-17 DIAGNOSIS — J301 Allergic rhinitis due to pollen: Secondary | ICD-10-CM | POA: Diagnosis not present

## 2022-02-24 DIAGNOSIS — J3089 Other allergic rhinitis: Secondary | ICD-10-CM | POA: Diagnosis not present

## 2022-02-24 DIAGNOSIS — R800 Isolated proteinuria: Secondary | ICD-10-CM | POA: Diagnosis not present

## 2022-02-24 DIAGNOSIS — J301 Allergic rhinitis due to pollen: Secondary | ICD-10-CM | POA: Diagnosis not present

## 2022-02-24 DIAGNOSIS — N200 Calculus of kidney: Secondary | ICD-10-CM | POA: Diagnosis not present

## 2022-02-24 DIAGNOSIS — J3081 Allergic rhinitis due to animal (cat) (dog) hair and dander: Secondary | ICD-10-CM | POA: Diagnosis not present

## 2022-03-03 DIAGNOSIS — J3089 Other allergic rhinitis: Secondary | ICD-10-CM | POA: Diagnosis not present

## 2022-03-03 DIAGNOSIS — J301 Allergic rhinitis due to pollen: Secondary | ICD-10-CM | POA: Diagnosis not present

## 2022-03-04 ENCOUNTER — Ambulatory Visit: Payer: PPO | Admitting: Internal Medicine

## 2022-03-14 ENCOUNTER — Encounter: Payer: Self-pay | Admitting: Pulmonary Disease

## 2022-03-18 ENCOUNTER — Ambulatory Visit: Payer: PPO | Admitting: Internal Medicine

## 2022-03-18 DIAGNOSIS — R809 Proteinuria, unspecified: Secondary | ICD-10-CM | POA: Diagnosis not present

## 2022-03-18 DIAGNOSIS — M3500 Sicca syndrome, unspecified: Secondary | ICD-10-CM | POA: Diagnosis not present

## 2022-03-18 DIAGNOSIS — J849 Interstitial pulmonary disease, unspecified: Secondary | ICD-10-CM | POA: Diagnosis not present

## 2022-03-18 DIAGNOSIS — I129 Hypertensive chronic kidney disease with stage 1 through stage 4 chronic kidney disease, or unspecified chronic kidney disease: Secondary | ICD-10-CM | POA: Diagnosis not present

## 2022-03-22 NOTE — Progress Notes (Deleted)
Office Visit Note  Patient: Sheri Andrews             Date of Birth: 1950-07-31           MRN: [id number]             PCP: [name], MD Referring: [name], * Visit Date: 03/23/2022   Subjective:  No chief complaint on file.   History of Present Illness: Sheri Andrews is a 71 y.o. female here for follow up for sjogren syndrome with associated ILD not on current immunosuppressive medication. Previous trial of Ofev and HCQ with concern for increased proteinuria. Urology assessment since last visit without significant problem found.***   Previous HPI 11/25/21 Sheri Andrews is a 71 y.o. female here for follow up sjogren's syndrome with arthralgias, sicca symptoms, and ILD.  Since her last visit she noticed foamy appearance changes to her urine and had greater than 1 g proteinuria.  Hydroxychloroquine was discontinued with improvement of the proteinuria.  However despite this improving her ongoing GI intolerance including a large weight loss this caused her to discontinue the Ofev.  Currently planning to wait a month before starting alternative medication for ILD according to pulmonology planning. Has not had any return of swelling or symptoms bothering her around the salivary glands or preauricular cyst since seeing ENT for this problem earlier.   Previous HPI 08/20/2021 Sheri Andrews is a 71 y.o. 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 Since our last visit she is doing about the same starting Ofev treatment with Dr. Vaughan Browner. She saw Dr. Wilburn Cornelia completed course of antibiotics and had CT imaging of her head no evidence of swelling involvement to her salivary glands or lymph nodes appears more consistent for preauricular cyst.   Previous HPI 06/27/21 Sheri Andrews is a 71 y.o. female here for evaluation of Sjogren's syndrome. She had recent positive ANA and SSA Abs checked associated with new  diagnosis of ILD. She has a previous history of moderate persistent asthma, GAVE, and barrett's esophagus. She had December visit with Dr. Erin Fulling for moderate persistent asthma with obstructive PFT findings and HRCT demonstrated definite changes compared to 2021. So far PFTs have not demonstrated any significant restrictive changes. Besides pulmonary issues she has a longstanding history of dry eyes and mouth complaints. She attributed this to her chronic allergic rhinitis and conjunctivitis and frequent sinusitis infections. She has taken allergy shots and frequent antihistamine medication for these problems. She sees ophthalmology recent exam with cataracts and also has some epiretinal membrane changes in both eyes. She mostly treats dry eyes with artificial tears/systane/refresh. She estimates dry mouth symptoms are since about 20 years ago. Just drinks a lot of fluid to treat this. She has numerous crowns no particular mouth ulcers or lesions. She has had oral thrush but was attributed to steroid inhaler for asthma. She has dysphagia reports inability to eat without drinking fluids between solid foods. 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 Silverio Decamp for this problem. She takes PPI long term for heartburn and cough. She has mild severity of joint pain increased in her hands within the past few years. Does not have prolonged morning stiffness. Sometimes swelling in bilateral hands not associated with numbness or decreased strength. She notices some redness and nodules in distal finger joints. Intermittently swelling in the feet and ankles  nothing persistently. She has some progressive 1st MTP bunions and left lateral foot tendonitis. Right cyst in front of right ear this is very chronic she discussed surgical removal with dermatologist a year ago. She sometimes expresses drainage from this during increased swelling. Recently  worse with tenderness, swelling, and erythema. She denies any lymph node swelling, fevers, skin rashes, or raynaud's symptoms. Most recent metabolic panel eGFR 59 no history of problems. Most recent CBC slightly elevated relative lymphocytes 52.6% normal total level.   Labs reviewed ANA 1:40 speckled SSA 6.4 dsDNA, SSB, Scl-70 neg RF neg CCP neg ANCA neg CBC 52.6% lymphs   No Rheumatology ROS completed.   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   High risk medication use 06/27/2021   ILD (interstitial lung disease) ([place]) 05/12/2021   Left foot pain 01/24/2021   Right epiretinal membrane 01/03/2020   Nuclear sclerotic cataract of right eye 01/03/2020   Nuclear sclerotic cataract of left eye 01/03/2020   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   Anatomical narrow angle of right eye 01/03/2020   GAVE (gastric antral vascular ectasia)    Iron deficiency anemia due to chronic blood loss    Chronic pansinusitis 06/22/2018   Nasal turbinate hypertrophy 06/22/2018   Asthma 05/25/2018   Essential hypertension 06/29/2017   Routine general medical examination at a health care facility 05/15/2016   Family history of early CAD 03/09/2014   Osteopenia 02/01/2012   BARRETTS ESOPHAGUS 10/08/2008   NEPHROLITHIASIS 04/30/2007   OCD (obsessive compulsive disorder) 04/11/2007   HYPERCHOLESTEROLEMIA 04/08/2007   Gastroesophageal reflux disease 04/08/2007   IRRITABLE BOWEL SYNDROME 04/08/2007    Past Medical History:  Diagnosis Date   Acid reflux disease    Allergic rhinitis    Anxiety    Asthma    Barrett esophagus     Dyspnea    Dysthymia    Hypercholesterolemia    Hypertension    Irritable bowel syndrome    Nephrolithiasis    Osteopenia    Osteoporosis    Other chest pain    Pulmonary fibrosis (HCC)    Sjogren's disease ([place])    Status post dilation of esophageal narrowing     Family History  Problem Relation Age of Onset   Heart disease Mother    Diabetes Mother    Heart disease Father    Diabetes Sister    Heart disease Sister    Diabetes Brother    Heart disease Brother    Heart disease Brother    Cancer Maternal Uncle        type unknown   Throat cancer Paternal Uncle    Colon cancer Neg Hx    Stomach cancer Neg Hx    Rectal cancer Neg Hx    Pancreatic cancer Neg Hx    Past Surgical History:  Procedure Laterality Date   ESOPHAGOGASTRODUODENOSCOPY (EGD) WITH PROPOFOL N/A 05/02/2019   Procedure: ESOPHAGOGASTRODUODENOSCOPY (EGD) WITH PROPOFOL;  Surgeon: Mauri Pole, MD;  Location: [facility];  Service: Endoscopy;  Laterality: N/A;  needing APC available   ESOPHAGOGASTRODUODENOSCOPY (EGD) WITH PROPOFOL N/A 08/11/2021   Procedure: ESOPHAGOGASTRODUODENOSCOPY (EGD) WITH PROPOFOL;  Surgeon: Harl Bowie  V, MD;  Location: [facility];  Service: Gastroenterology;  Laterality: N/A;   FACIAL COSMETIC SURGERY     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 [place]   Social History   Social History Narrative   Not on file   Immunization History  Administered Date(s) Administered   Fluad Quad(high Dose 65+) 01/21/2021, 02/12/2022   Influenza Split 02/01/2012   Influenza Whole 01/31/2009   Influenza, High Dose Seasonal PF 02/17/2016, 01/25/2017, 01/23/2019, 01/30/2020, 02/11/2021   Influenza,inj,Quad PF,6+ Mos 03/28/2015   Influenza,inj,quad, With Preservative 01/19/2019   Influenza-Unspecified 02/01/2017   PFIZER(Purple Top)SARS-COV-2 Vaccination 05/25/2019, 06/21/2019, 02/27/2020   Pneumococcal Conjugate-13 03/28/2015   Pneumococcal  Polysaccharide-23 11/11/2015, 05/14/2016, 01/30/2020, 02/11/2021   Tdap 08/17/2011   Zoster Recombinat (Shingrix) 07/14/2017, 11/11/2017     Objective: Vital Signs: There were no vitals taken for this visit.   Physical Exam   Musculoskeletal Exam: ***  CDAI Exam: CDAI Score: -- Patient Global: --; Provider Global: -- Swollen: --; Tender: -- Joint Exam 03/23/2022   No joint exam has been documented for this visit   There is currently no information documented on the homunculus. Go to the Rheumatology activity and complete the homunculus joint exam.  Investigation: No additional findings.  Imaging: No results found.  Recent Labs: Lab Results  Component Value Date   WBC 7.7 08/29/2021   HGB 13.2 08/29/2021   PLT 224 08/29/2021   NA 140 11/19/2021   K 3.8 11/19/2021   CL 103 11/19/2021   CO2 28 11/19/2021   GLUCOSE 94 11/19/2021   BUN 14 11/19/2021   CREATININE 0.82 11/19/2021   BILITOT 0.5 11/19/2021   ALKPHOS 40 11/19/2021   AST 23 11/19/2021   ALT 20 11/19/2021   PROT 7.2 11/19/2021   ALBUMIN 4.2 11/19/2021   CALCIUM 9.3 11/19/2021   GFRAA  08/11/2008    >60        The eGFR has been calculated using the MDRD equation. This calculation has not been validated in all clinical situations. eGFR's persistently <60 mL/min signify possible Chronic Kidney Disease.    Speciality Comments: No specialty comments available.  Procedures:  No procedures performed Allergies: Amoxicillin, Wellbutrin [bupropion], Doxycycline hyclate, and Erythromycin ethylsuccinate   Assessment / Plan:     Visit Diagnoses: No diagnosis found.  ***  Orders: No orders of the defined types were placed in this encounter.  No orders of the defined types were placed in this encounter.    Follow-Up Instructions: No follow-ups on file.   Collier Salina, MD  Note - This record has been created using [employer].  Chart creation errors have been sought, but may not  always  have been located. Such creation errors do not reflect on  the standard of medical care.

## 2022-03-23 ENCOUNTER — Ambulatory Visit (HOSPITAL_COMMUNITY)
Admission: RE | Admit: 2022-03-23 | Discharge: 2022-03-23 | Disposition: A | Discharge status: Home / Self Care | Payer: PPO | Source: Ambulatory Visit | Attending: Pulmonary Disease | Admitting: Pulmonary Disease

## 2022-03-23 ENCOUNTER — Ambulatory Visit: Payer: PPO | Admitting: Internal Medicine

## 2022-03-23 DIAGNOSIS — I700 Atherosclerosis of aorta: Secondary | ICD-10-CM | POA: Diagnosis not present

## 2022-03-23 DIAGNOSIS — R918 Other nonspecific abnormal finding of lung field: Secondary | ICD-10-CM | POA: Diagnosis not present

## 2022-03-23 DIAGNOSIS — J849 Interstitial pulmonary disease, unspecified: Secondary | ICD-10-CM | POA: Diagnosis not present

## 2022-03-23 DIAGNOSIS — K449 Diaphragmatic hernia without obstruction or gangrene: Secondary | ICD-10-CM | POA: Diagnosis not present

## 2022-03-25 ENCOUNTER — Encounter: Payer: Self-pay | Admitting: Pulmonary Disease

## 2022-03-25 ENCOUNTER — Ambulatory Visit (INDEPENDENT_AMBULATORY_CARE_PROVIDER_SITE_OTHER): Payer: PPO | Admitting: Pulmonary Disease

## 2022-03-25 ENCOUNTER — Encounter: Payer: Self-pay | Admitting: Internal Medicine

## 2022-03-25 VITALS — BP 132/88 | HR 87 | Temp 98.1°F | Ht 63.0 in | Wt 139.0 lb

## 2022-03-25 DIAGNOSIS — M3502 Sicca syndrome with lung involvement: Secondary | ICD-10-CM

## 2022-03-25 DIAGNOSIS — J849 Interstitial pulmonary disease, unspecified: Secondary | ICD-10-CM

## 2022-03-25 DIAGNOSIS — J329 Chronic sinusitis, unspecified: Secondary | ICD-10-CM | POA: Diagnosis not present

## 2022-03-25 LAB — PULMONARY FUNCTION TEST
DL/VA % pred: 119 %
DL/VA: 4.97 ml/min/mmHg/L
DLCO cor % pred: 69 %
DLCO cor: 13.03 ml/min/mmHg
DLCO unc % pred: 69 %
DLCO unc: 13.03 ml/min/mmHg
FEF 25-75 Post: 0.72 L/sec
FEF 25-75 Pre: 1.59 L/sec
FEF2575-%Change-Post: -54 %
FEF2575-%Pred-Post: 40 %
FEF2575-%Pred-Pre: 88 %
FEV1-%Change-Post: -30 %
FEV1-%Pred-Post: 64 %
FEV1-%Pred-Pre: 92 %
FEV1-Post: 1.38 L
FEV1-Pre: 1.99 L
FEV1FVC-%Change-Post: -31 %
FEV1FVC-%Pred-Pre: 101 %
FEV6-%Change-Post: 1 %
FEV6-%Pred-Post: 97 %
FEV6-%Pred-Pre: 95 %
FEV6-Post: 2.64 L
FEV6-Pre: 2.59 L
FEV6FVC-%Change-Post: 0 %
FEV6FVC-%Pred-Post: 104 %
FEV6FVC-%Pred-Pre: 104 %
FVC-%Change-Post: 1 %
FVC-%Pred-Post: 93 %
FVC-%Pred-Pre: 91 %
FVC-Post: 2.64 L
FVC-Pre: 2.59 L
Post FEV1/FVC ratio: 52 %
Post FEV6/FVC ratio: 100 %
Pre FEV1/FVC ratio: 77 %
Pre FEV6/FVC Ratio: 100 %
RV % pred: 78 %
RV: 1.7 L
TLC % pred: 108 %
TLC: 5.32 L

## 2022-03-25 MED ORDER — PREDNISONE 10 MG PO TABS: ORAL_TABLET | ORAL | 0 refills | Status: AC

## 2022-03-25 MED ORDER — AMOXICILLIN-POT CLAVULANATE 875-125 MG PO TABS: 1.0000 | ORAL_TABLET | Freq: Two times a day (BID) | ORAL | 0 refills | Status: DC

## 2022-03-25 MED ORDER — FLUCONAZOLE 200 MG PO TABS: 200.0000 mg | ORAL_TABLET | Freq: Every day | ORAL | 0 refills | Status: DC | PRN

## 2022-03-25 NOTE — Progress Notes (Signed)
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   She has been doing well since last visit. No progressive changes in her exertional dyspnea. She recently saw Dr. Johnney Ou of nephrology and is on losartan now for her blood pressure. Her Cr has normalized.   PFTs today show stable FEV1 and FVC and improved TLC. Her DLCO has decreased but she reports having trouble completing the breathing test at the end. Her follow up HRCT Chest shows improvement of the bilateral lower lobe interstitial findings compared to last CT scan 04/29/21.  OV 12/24/21 She is feeling much better since stopping Ofev. She feels tired and reports she is not doing as much as she used to. She saw Dr. Johnney Ou of nephrology who is monitoring the proteinuria and is not concerned for underlying kidney disease at this time. She has gained 2lbs since last visit.  OV 11/20/21 She reports 14lbs weight loss, lack of appetite and diarrhea since being on the Ofev. She denies any issues with her breathing at this time. She has follow up with Dr. Benjamine Mola later this month and with nephrology in September.   OV 09/18/21 She met with Dr. Vaughan Browner on 07/09/21 for second opinion. 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 She was started on hydroxychloroquine for the Sjogren's. It was recommended that we consider adding cellcept to her treatment regimen by Dr. Vaughan Browner.    She was started on Ofev in 07/2021. We received message  on 5/16 that she is noticing foamy urine so a UA was performed by her PCP which showed elevated protein in the urine, presence of calcium oxalate crystals and elevated microalbumin. We have ordered her for a 24 hour urine collection to quantify the protein in her urine. This is noted for be a very rare side effect from Ofev as discussed with representatives from the [facility] and my colleagues.   She has tolerated Ofev well without much side effects. She has lost 5lbs and has been trying to lose weight by going to the gym.   Timeline for recent medications and proteinuria: 4/17 - Started Ofev  5/4 - Started hydroxychloroquine 5/7-9 Noted foamy urine 5/11 - UA performed indicating proteinuria 5/24 - Stopped hydroxychloroquine   OV 06/04/21 HRCT Chest was ordered after last visit due to abnormal flow volume loop concerning for either fixed airway obstruction versus variable extrathoracic airway obstruction. CT scan showed some patchy areas of ground glass attenuation, septal thickening and parenchymal banding in the lungs bilaterally, most evident in the mid to lower lungs. Very mild cylindrical traction bronchiectasis and peripheral bronchiolectasis. No frank honeycombing. Inspiratory and expiratory imaging indicates mild air trapping. Final read was concerning for probable usual interstitial pneumonia.   These results were discussed with the patient at her visit with Rexene Edison, NP on 05/12/21 and an inflammatory panel was checked with a positive ANA titer of 1:40 with elevated SSA ab at 6.4. She was referred to rheumatology and has an appointment on 06/27/21 with Dr. Benjamine Mola.  She denies any diffuse joint aches or skin changes. She does have some dry eyes. No family history of pulmonary fibrosis or autoimmune conditions.  OV 04/08/21 She was trialed on Breztri inhaler after last visit and she does report some improvement in her breathing.  She was seen in video visit on 04/02/2021 for  increasing chest tightness, nonproductive cough and some wheezing.  She was provided with steroid taper and noted improvement in her cough and wheezing but she continues to experience chest tightness.  Pulmonary function test today shows moderate obstructive defect with flow volume loop concerning for possible fixed airway obstruction versus possible variable extrathoracic airway obstruction.  OV 02/24/21 She has been using symbicort 160-4.24mg 2 puffs twice daily but has noticed increase shortness of breath since having covid 19 in June. She was treated outpatient with an antiviral medication. She denies night time awakenings. She is mainly experiencing exertional dyspnea and does notice wheezing when she is walking for exercise.   She continues on medications for her GERD and sinus congestion/drainage with out issues.  OV 05/29/20 [facility] with sinus pressure, headaches, cough and sore throat. She tested negative for Covid 19. Chest radiograph was unremarkable as well as BMP and CBC. She was provided with azelastine nasal spray and discharged home. She complains of cough since her ER visit that is dry. She has congestion with post-nasal drip. She has some sinus congestion and ear discomfort. She has shortness of breath with exertion and with coughing episodes. She has history of hiatal hernia and is on pantoprazole and famotidine for GERD. She reports a substernal chest discomfort that is not worse with exertion or associated with nausea or diaphoresis.   She is taking symbicort 160-4.544m 2 puffs twice daily and does not complain of frequent wheezing. She is on montelukast for asthma and allergies. She is on flonase daily.   She has seen Dr. ShWilburn Cornelian ENT perviously for pansinusitis, last seen in 06/2018.          Past Medical History:  Diagnosis Date   Acid reflux disease    Allergic rhinitis    Anxiety    Asthma    Barrett esophagus    Dyspnea    Dysthymia     Hypercholesterolemia    Hypertension    Irritable bowel syndrome    Nephrolithiasis    Osteopenia    Osteoporosis    Other chest pain    Pulmonary fibrosis (HCC)    [place] disease [place]   Status post dilation of esophageal narrowing      Family History  Problem Relation Age of Onset   Heart disease Mother    Diabetes Mother    Heart disease Father    Diabetes Sister    Heart disease Sister    Diabetes Brother    Heart disease Brother    Heart disease Brother    Cancer Maternal Uncle        type unknown   Throat cancer Paternal Uncle    Colon cancer Neg Hx    Stomach cancer Neg Hx    Rectal cancer Neg Hx    Pancreatic cancer Neg Hx      Social History   Socioeconomic History   Marital status: Married    Spouse name: Not on file   Number of children: 2   Years of education: Not on file   Highest education level: Not on file  Occupational History   Occupation: retired  Tobacco Use   Smoking status: Never   Smokeless tobacco: Never  Vaping Use   Vaping Use: Never used  Substance and Sexual Activity   Alcohol use: Yes    Alcohol/week: 2.0 standard drinks of alcohol    Types: 2 Shots of liquor per week   Drug use: No   Sexual activity: Yes    Partners: Male  Other Topics Concern   Not on file  Social History Narrative   Not on file   Social Determinants of Health   Financial Resource Strain: Low Risk  (02/12/2022)   Overall Financial Resource Strain (CARDIA)    Difficulty of Paying Living Expenses: Not hard at all  Food Insecurity: No Food Insecurity (02/12/2022)   Hunger Vital Sign    Worried About Running Out of Food in the Last Year: Never true    Ran Out of Food in the Last Year: Never true  Transportation Needs: No Transportation Needs (02/12/2022)   PRAPARE - Hydrologist (Medical): No    Lack of Transportation (Non-Medical): No  Physical Activity: Sufficiently Active (02/12/2022)   Exercise Vital Sign    Days  of Exercise per Week: 7 days    Minutes of Exercise per Session: 30 min  Stress: No Stress Concern Present (02/12/2022)   [place]    Feeling of Stress : Not at all  Social Connections: [place] (02/12/2022)   Social Connection and Isolation Panel [NHANES]    Frequency of Communication with Friends and Family: More than three times a week    Frequency of Social Gatherings with Friends and Family: More than three times a week    Attends Religious Services: More than 4 times per year    Active Member of [employer] or Organizations: Yes    Attends Music therapist: More than 4 times per year    Marital Status: Married  Human resources officer Violence: Not At Risk (02/12/2022)   Humiliation, Afraid, Rape, and Kick questionnaire    Fear of Current or Ex-Partner: No    Emotionally Abused: No    Physically Abused: No    Sexually Abused: No     Allergies  Allergen Reactions   Amoxicillin Other (See Comments)    Upset stomach   Wellbutrin [Bupropion] Swelling   Doxycycline Hyclate Other (See Comments)    upset  stomach   Erythromycin Ethylsuccinate Other (See Comments)     upset stomach     Outpatient Medications Prior to Visit  Medication Sig Dispense Refill   albuterol (VENTOLIN HFA) 108 (90 Base) MCG/ACT inhaler 1-2 puff as needed 18 g 3   Artificial Tear Solution (TEARS NATURALE OP) Place 1 drop into both eyes See admin instructions. Use 1 drop into both eyes (scheduled) in the morning & may use 3 times daily if needed for dry /irritated eyes.     atorvastatin (LIPITOR) 40 MG tablet Take 1 tablet (40 mg total) by mouth daily. 90 tablet 2   azelastine (OPTIVAR) 0.05 % ophthalmic solution INSTILL 1 DROP IN BOTH EYES TWICE DAILY AS NEEDED 18 mL 0   Azelastine HCl 0.15 % SOLN USE 2 SPRAYS IN EACH NOSTRIL IN THE MORNING AND AT BEDTIME (Patient taking differently: Place 2 sprays into both nostrils 2 (two)  times daily as needed (allergies.).) 30 mL 0   benzonatate (TESSALON PERLES) 100 MG capsule Take 1 capsule (100 mg total) by mouth 3 (three) times daily as needed.  30 capsule 0   budesonide-formoterol (SYMBICORT) 160-4.5 MCG/ACT inhaler Inhale 2 puffs into the lungs 2 (two) times daily. (Patient taking differently: Inhale 2 puffs into the lungs 2 (two) times daily as needed (respiratory issues.).) 1 each 6   clonazePAM (KLONOPIN) 0.25 MG disintegrating tablet DISSOLVE ONE TABLET BY MOUTH EVERY DAY AS NEEDED 30 tablet 3   EPIPEN 2-PAK 0.3 MG/0.3ML SOAJ injection Inject 0.3 mg into the muscle as needed for anaphylaxis.   1   escitalopram (LEXAPRO) 10 MG tablet TAKE 1 TABLET(10 MG) BY MOUTH DAILY (Patient taking differently: 20 mg.) 90 tablet 3   estrogen, conjugated,-medroxyprogesterone (PREMPRO) 0.45-1.5 MG per tablet Take 1 tablet by mouth at bedtime.      fluticasone (FLONASE) 50 MCG/ACT nasal spray Place 2 sprays into both nostrils in the morning and at bedtime.     ipratropium (ATROVENT) 0.03 % nasal spray Place 2 sprays into both nostrils every 12 (twelve) hours. (Patient taking differently: Place 2 sprays into both nostrils 2 (two) times daily as needed (allergies.).) 30 mL 12   lansoprazole (PREVACID) 30 MG capsule TAKE 1 CAPSULE(30 MG) BY MOUTH TWICE DAILY BEFORE A MEAL 60 capsule 6   losartan (COZAAR) 50 MG tablet Take 1 tablet (50 mg total) by mouth daily. 90 tablet 3   montelukast (SINGULAIR) 10 MG tablet Take 1 tablet (10 mg total) by mouth at bedtime. 90 tablet 3   Prenatal Vit-Fe Fumarate-FA (PRENATAL VITAMIN PO) Take 1 tablet by mouth every evening.     Probiotic Product (PROBIOTIC COLON SUPPORT PO) Take 1 capsule by mouth at bedtime.      Spacer/Aero-Holding Chambers (VALVED HOLDING CHAMBER) DEVI SMARTSIG:Inhalation Via Inhaler As Directed     amLODipine (NORVASC) 10 MG tablet TAKE 1 TABLET(10 MG) BY MOUTH DAILY Strength: 10 mg 90 tablet 2   No facility-administered medications prior  to visit.   Review of Systems  Constitutional:  Negative for chills, fever, malaise/fatigue and weight loss.  HENT:  Negative for congestion, sinus pain and sore throat.   Eyes: Negative.   Respiratory:  Positive for shortness of breath (exertional). Negative for cough, hemoptysis, sputum production and wheezing.   Cardiovascular:  Negative for chest pain, palpitations, orthopnea, claudication and leg swelling.  Gastrointestinal:  Negative for abdominal pain, diarrhea, heartburn, nausea and vomiting.  Genitourinary: Negative.   Musculoskeletal:  Negative for joint pain and myalgias.  Skin:  Negative for rash.  Neurological:  Negative for weakness.  Endo/Heme/Allergies: Negative.   Psychiatric/Behavioral: Negative.     Objective:   Vitals:   03/25/22 1300  BP: 132/88  Pulse: 87  Temp: 98.1 F (36.7 C)  SpO2: 98%  Weight: 139 lb (63 kg)  Height: _0  (1.6 m)   Physical Exam Constitutional:      General: She is not in acute distress.    Appearance: Normal appearance. She is not ill-appearing.  HENT:     Head: Normocephalic and atraumatic.  Cardiovascular:     Rate and Rhythm: Normal rate and regular rhythm.     Pulses: Normal pulses.     Heart sounds: Normal heart sounds. No murmur heard. Pulmonary:     Effort: Pulmonary effort is normal.     Breath sounds: No wheezing, rhonchi or rales.  Musculoskeletal:     Right lower leg: No edema.     Left lower leg: No edema.  Skin:    General: Skin is warm and dry.  Neurological:     General: No focal deficit present.  Mental Status: She is alert.    CBC    Component Value Date/Time   WBC 7.7 08/29/2021 1315   RBC 4.36 08/29/2021 1315   HGB 13.2 08/29/2021 1315   HCT 38.0 08/29/2021 1315   PLT 224 08/29/2021 1315   MCV 87.2 08/29/2021 1315   MCH 30.3 08/29/2021 1315   MCHC 34.7 08/29/2021 1315   RDW 13.3 08/29/2021 1315   LYMPHSABS 2.6 06/19/2021 1239   MONOABS 0.5 06/19/2021 1239   EOSABS 0.1 06/19/2021 1239    BASOSABS 0.0 06/19/2021 1239      Latest Ref Rng & Units 11/19/2021    1:04 PM 09/19/2021   11:01 AM 08/29/2021    1:15 PM  BMP  Glucose 70 - 99 mg/dL 94  95  107   BUN 6 - 23 mg/dL _0 Creatinine 0.40 - 1.20 mg/dL 0.82  0.77  0.90   Sodium 135 - 145 mEq/L 140  139  139   Potassium 3.5 - 5.1 mEq/L 3.8  3.6  3.4   Chloride 96 - 112 mEq/L 103  106  106   CO2 19 - 32 mEq/L _1 Calcium 8.4 - 10.5 mg/dL 9.3  9.3  9.9    Chest imaging: HRCT Chest 03/23/22 Very mild patchy scattered subpleural reticulation and ground-glass opacity in both lungs, substantially decreased since 04/29/2021 chest CT. No significant bronchiectasis or honeycombing. Favor residual mild bland nonspecific postinfectious/postinflammatory scarring. Interstitial lung disease less favored given the significant improvement. 2. One vessel coronary atherosclerosis. 3. Small hiatal hernia. 4.  Aortic Atherosclerosis  HRCT Chest 04/29/21 1. 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 These findings are clearly progressive compared to prior cardiac CT 01/25/2020. Repeat high-resolution chest CT is suggested in 12 months to assess for temporal changes in the appearance of the lung parenchyma. 2. Aortic atherosclerosis, in addition to left main and 2 vessel coronary artery disease.   CXR 02/24/21 Increased interstitial markings bilaterally  CXR 04/16/20 The heart size and mediastinal contours are within normal limits. Both lungs are clear. No pleural effusion. The visualized skeletal structures are unremarkable.  PFT:    Latest Ref Rng & Units 04/08/2021   11:06 AM  PFT Results  FVC-Pre L 2.74   FVC-Predicted Pre % 96   FVC-Post L 2.77   FVC-Predicted Post % 97   Pre FEV1/FVC % % 66   Post FEV1/FCV % % 50   FEV1-Pre L 1.82   FEV1-Predicted Pre % 85    FEV1-Post L 1.37   DLCO uncorrected ml/min/mmHg 22.11   DLCO UNC% % 117   DLCO corrected ml/min/mmHg 22.11   DLCO COR %Predicted % 117   DLVA Predicted % 106   TLC L 5.14   TLC % Predicted % 104   RV % Predicted % 92     Labs:  Path:  Echo:  Heart Catheterization:  Assessment & Plan:   ILD (interstitial lung disease) (HCC)  Discussion: Ashle Stief is a 71 year old woman, never smoker with history of allergic rhinitis, asthma and GERD who returns to pulmonary clinic for asthma and 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   She was started on Ofev 4/17 and stopped 8/3 due to intolerance with weight loss and GI symptoms. There was also concern for proteinuria but this is felt mostly due  to hydroxychloroquine. She is following with nephrology for proteinuria. Her renal function and proteinuria have improved since stopping the above medications.  Repeat PFTs today are overall stable with and isolated decreased in DLCO. I question how accurate this is based on her difficulty completing the PFT today. Her HRCT Chest scan shows improvement in the bilateraly patchy interstitial changes. She is overall feeling well without progression of her exertional dyspnea.   We will continue to monitor with PFTs in 3 months.   She can continue to use symbicort inhaler.   Will prescribe augmentin BID for 7 days for a sinus infection.  Follow up in 3 months.  Freda Jackson, MD [facility] Office: [phone]      Current Outpatient Medications:    albuterol (VENTOLIN HFA) 108 (90 Base) MCG/ACT inhaler, 1-2 puff as needed, Disp: 18 g, Rfl: 3   Artificial Tear Solution (TEARS NATURALE OP), Place 1 drop into both eyes See admin instructions. Use 1 drop into both eyes (scheduled) in the morning & may use 3 times daily if needed for dry /irritated eyes., Disp: , Rfl:    atorvastatin (LIPITOR) 40 MG tablet, Take 1 tablet (40 mg total) by mouth  daily., Disp: 90 tablet, Rfl: 2   azelastine (OPTIVAR) 0.05 % ophthalmic solution, INSTILL 1 DROP IN BOTH EYES TWICE DAILY AS NEEDED, Disp: 18 mL, Rfl: 0   Azelastine HCl 0.15 % SOLN, USE 2 SPRAYS IN EACH NOSTRIL IN THE MORNING AND AT BEDTIME (Patient taking differently: Place 2 sprays into both nostrils 2 (two) times daily as needed (allergies.).), Disp: 30 mL, Rfl: 0   benzonatate (TESSALON PERLES) 100 MG capsule, Take 1 capsule (100 mg total) by mouth 3 (three) times daily as needed., Disp: 30 capsule, Rfl: 0   budesonide-formoterol (SYMBICORT) 160-4.5 MCG/ACT inhaler, Inhale 2 puffs into the lungs 2 (two) times daily. (Patient taking differently: Inhale 2 puffs into the lungs 2 (two) times daily as needed (respiratory issues.).), Disp: 1 each, Rfl: 6   clonazePAM (KLONOPIN) 0.25 MG disintegrating tablet, DISSOLVE ONE TABLET BY MOUTH EVERY DAY AS NEEDED, Disp: 30 tablet, Rfl: 3   EPIPEN 2-PAK 0.3 MG/0.3ML SOAJ injection, Inject 0.3 mg into the muscle as needed for anaphylaxis. , Disp: , Rfl: 1   escitalopram (LEXAPRO) 10 MG tablet, TAKE 1 TABLET(10 MG) BY MOUTH DAILY (Patient taking differently: 20 mg.), Disp: 90 tablet, Rfl: 3   estrogen, conjugated,-medroxyprogesterone (PREMPRO) 0.45-1.5 MG per tablet, Take 1 tablet by mouth at bedtime. , Disp: , Rfl:    fluticasone (FLONASE) 50 MCG/ACT nasal spray, Place 2 sprays into both nostrils in the morning and at bedtime., Disp: , Rfl:    ipratropium (ATROVENT) 0.03 % nasal spray, Place 2 sprays into both nostrils every 12 (twelve) hours. (Patient taking differently: Place 2 sprays into both nostrils 2 (two) times daily as needed (allergies.).), Disp: 30 mL, Rfl: 12   lansoprazole (PREVACID) 30 MG capsule, TAKE 1 CAPSULE(30 MG) BY MOUTH TWICE DAILY BEFORE A MEAL, Disp: 60 capsule, Rfl: 6   losartan (COZAAR) 50 MG tablet, Take 1 tablet (50 mg total) by mouth daily., Disp: 90 tablet, Rfl: 3   montelukast (SINGULAIR) 10 MG tablet, Take 1 tablet (10 mg total)  by mouth at bedtime., Disp: 90 tablet, Rfl: 3   Prenatal Vit-Fe Fumarate-FA (PRENATAL VITAMIN PO), Take 1 tablet by mouth every evening., Disp: , Rfl:    Probiotic Product (PROBIOTIC COLON SUPPORT PO), Take 1 capsule by mouth at bedtime. , Disp: ,  Rfl:    Spacer/Aero-Holding Chambers (VALVED HOLDING CHAMBER) DEVI, SMARTSIG:Inhalation Via Inhaler As Directed, Disp: , Rfl:

## 2022-03-25 NOTE — Progress Notes (Signed)
Full PFT performed today. °

## 2022-03-25 NOTE — Patient Instructions (Signed)
Full PFT performed today. °

## 2022-03-25 NOTE — Patient Instructions (Addendum)
Start augmentin for sinus infection  Start prednisone taper for sinus infection  Continue to use symbicort as needed  Your CT scan looks improved overall  Your PFTs are stable but your diffusion capacity is lower than before. We will check PFTs in 3 months to monitor  Follow up in 3 months

## 2022-03-26 MED ORDER — ESCITALOPRAM OXALATE 20 MG PO TABS: 20.0000 mg | ORAL_TABLET | Freq: Every day | ORAL | 3 refills | Status: DC

## 2022-03-26 NOTE — Addendum Note (Signed)
Addended by: Pricilla Holm A on: 03/26/2022 10:04 AM   Modules accepted: Orders

## 2022-03-31 DIAGNOSIS — J3089 Other allergic rhinitis: Secondary | ICD-10-CM | POA: Diagnosis not present

## 2022-03-31 DIAGNOSIS — J301 Allergic rhinitis due to pollen: Secondary | ICD-10-CM | POA: Diagnosis not present

## 2022-04-05 ENCOUNTER — Encounter: Payer: Self-pay | Admitting: Pulmonary Disease

## 2022-04-23 DIAGNOSIS — J301 Allergic rhinitis due to pollen: Secondary | ICD-10-CM | POA: Diagnosis not present

## 2022-04-23 DIAGNOSIS — J3089 Other allergic rhinitis: Secondary | ICD-10-CM | POA: Diagnosis not present

## 2022-04-29 ENCOUNTER — Ambulatory Visit: Payer: PPO | Admitting: Neurology

## 2022-05-04 DIAGNOSIS — J301 Allergic rhinitis due to pollen: Secondary | ICD-10-CM | POA: Diagnosis not present

## 2022-05-04 DIAGNOSIS — H1045 Other chronic allergic conjunctivitis: Secondary | ICD-10-CM | POA: Diagnosis not present

## 2022-05-04 DIAGNOSIS — T781XXD Other adverse food reactions, not elsewhere classified, subsequent encounter: Secondary | ICD-10-CM | POA: Diagnosis not present

## 2022-05-04 DIAGNOSIS — J3089 Other allergic rhinitis: Secondary | ICD-10-CM | POA: Diagnosis not present

## 2022-05-04 DIAGNOSIS — R052 Subacute cough: Secondary | ICD-10-CM | POA: Diagnosis not present

## 2022-05-06 DIAGNOSIS — J301 Allergic rhinitis due to pollen: Secondary | ICD-10-CM | POA: Diagnosis not present

## 2022-05-06 DIAGNOSIS — J3089 Other allergic rhinitis: Secondary | ICD-10-CM | POA: Diagnosis not present

## 2022-05-12 ENCOUNTER — Ambulatory Visit: Payer: PPO | Admitting: Cardiovascular Disease

## 2022-05-12 ENCOUNTER — Encounter: Payer: Self-pay | Admitting: Pulmonary Disease

## 2022-05-12 DIAGNOSIS — J329 Chronic sinusitis, unspecified: Secondary | ICD-10-CM

## 2022-05-13 ENCOUNTER — Telehealth (INDEPENDENT_AMBULATORY_CARE_PROVIDER_SITE_OTHER): Payer: PPO | Admitting: Internal Medicine

## 2022-05-13 ENCOUNTER — Telehealth: Payer: Self-pay | Admitting: Internal Medicine

## 2022-05-13 DIAGNOSIS — E7800 Pure hypercholesterolemia, unspecified: Secondary | ICD-10-CM | POA: Diagnosis not present

## 2022-05-13 DIAGNOSIS — J849 Interstitial pulmonary disease, unspecified: Secondary | ICD-10-CM | POA: Diagnosis not present

## 2022-05-13 DIAGNOSIS — U071 COVID-19: Secondary | ICD-10-CM

## 2022-05-13 DIAGNOSIS — T7840XD Allergy, unspecified, subsequent encounter: Secondary | ICD-10-CM | POA: Diagnosis not present

## 2022-05-13 MED ORDER — AMOXICILLIN-POT CLAVULANATE 875-125 MG PO TABS: 1.0000 | ORAL_TABLET | Freq: Two times a day (BID) | ORAL | 0 refills | Status: DC

## 2022-05-13 MED ORDER — HYDROCODONE BIT-HOMATROP MBR 5-1.5 MG/5ML PO SOLN: 5.0000 mL | Freq: Four times a day (QID) | ORAL | 0 refills | Status: AC | PRN

## 2022-05-13 MED ORDER — NIRMATRELVIR/RITONAVIR (PAXLOVID)TABLET: 3.0000 | ORAL_TABLET | Freq: Two times a day (BID) | ORAL | 0 refills | Status: AC

## 2022-05-13 MED ORDER — FLUCONAZOLE 200 MG PO TABS: 200.0000 mg | ORAL_TABLET | Freq: Every day | ORAL | 0 refills | Status: DC | PRN

## 2022-05-13 MED ORDER — PREDNISONE 20 MG PO TABS: 20.0000 mg | ORAL_TABLET | Freq: Every day | ORAL | 0 refills | Status: DC

## 2022-05-13 NOTE — Progress Notes (Unsigned)
Patient ID: Sheri Andrews, female   DOB: [birth year], 72 y.o.   MRN: [id number]  Virtual Visit via Video Note  I connected with Sheri Andrews on 05/14/22 at  1:40 PM EST by a video enabled telemedicine application and verified that I am speaking with the correct person using two identifiers.  Location of all participants today Patient: at home Provider: at office   I discussed the limitations of evaluation and management by telemedicine and the availability of in person appointments. The patient expressed understanding and agreed to proceed.  History of Present Illness:  Here with 2-3 days acute onset fever, facial pain, pressure, headache, general weakness and malaise, , with mild ST and cough, but pt denies chest pain, wheezing, increased sob or doe, orthopnea, PND, increased LE swelling, palpitations, dizziness or syncope. Also no n/v/d.  Has undelrying Ild , inhaler helps.  Pt tested + home covid yesterday, asking for tx.  Does have several wks ongoing nasal allergy symptoms with clearish congestion, itch and sneezing, without fever, pain, ST, cough, swelling or wheezing.  Taking crestor without difficutly. Past Medical History:  Diagnosis Date   Acid reflux disease    Allergic rhinitis    Anxiety    Asthma    Barrett esophagus    Dyspnea    Dysthymia    Hypercholesterolemia    Hypertension    Irritable bowel syndrome    Nephrolithiasis    Osteopenia    Osteoporosis    Other chest pain    Pulmonary fibrosis (HCC)    Sjogren's disease (HCC)    Status post dilation of esophageal narrowing    Past Surgical History:  Procedure Laterality Date   ESOPHAGOGASTRODUODENOSCOPY (EGD) WITH PROPOFOL N/A 05/02/2019   Procedure: ESOPHAGOGASTRODUODENOSCOPY (EGD) WITH PROPOFOL;  Surgeon: Mauri Pole, MD;  Location: [facility];  Service: Endoscopy;  Laterality: N/A;  needing APC available   ESOPHAGOGASTRODUODENOSCOPY (EGD) WITH PROPOFOL N/A 08/11/2021   Procedure:  ESOPHAGOGASTRODUODENOSCOPY (EGD) WITH PROPOFOL;  Surgeon: Mauri Pole, MD;  Location: [facility];  Service: Gastroenterology;  Laterality: N/A;   FACIAL COSMETIC SURGERY     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 She has never used smokeless tobacco. She reports current alcohol use of about 2.0 standard drinks of alcohol per week. She reports that she does not use drugs. family history includes Cancer in her maternal uncle; Diabetes in her brother, mother, and sister; Heart disease in her brother, brother, father, mother, and sister; Throat cancer in her paternal uncle. Allergies  Allergen Reactions   Amoxicillin Other (See Comments)    Upset stomach   Wellbutrin [Bupropion] Swelling   Doxycycline Hyclate Other (See Comments)    upset  stomach   Erythromycin Ethylsuccinate Other (See Comments)     upset stomach   Current Outpatient Medications on File Prior to Visit  Medication Sig Dispense Refill   albuterol (VENTOLIN HFA) 108 (90 Base) MCG/ACT inhaler 1-2 puff as needed 18 g 3   amoxicillin-clavulanate (AUGMENTIN) 875-125 MG tablet Take 1 tablet by mouth 2 (two) times daily. 14 tablet 0   Artificial Tear Solution (TEARS NATURALE OP) Place 1 drop into both eyes See admin instructions. Use 1 drop into both eyes (scheduled) in the morning & may use 3 times daily if needed for dry /irritated eyes.     atorvastatin (LIPITOR) 40 MG tablet Take 1 tablet (40 mg total) by  mouth daily. 90 tablet 2   azelastine (OPTIVAR) 0.05 % ophthalmic solution INSTILL 1 DROP IN BOTH EYES TWICE DAILY AS NEEDED 18 mL 0   Azelastine HCl 0.15 % SOLN USE 2 SPRAYS IN EACH NOSTRIL IN THE MORNING AND AT BEDTIME (Patient taking differently: Place 2 sprays into both nostrils 2 (two) times daily as needed (allergies.).) 30 mL 0   benzonatate (TESSALON PERLES) 100 MG capsule Take 1 capsule (100 mg total) by mouth 3 (three) times daily as  needed. 30 capsule 0   budesonide-formoterol (SYMBICORT) 160-4.5 MCG/ACT inhaler Inhale 2 puffs into the lungs 2 (two) times daily. (Patient taking differently: Inhale 2 puffs into the lungs 2 (two) times daily as needed (respiratory issues.).) 1 each 6   clonazePAM (KLONOPIN) 0.25 MG disintegrating tablet DISSOLVE ONE TABLET BY MOUTH EVERY DAY AS NEEDED 30 tablet 3   EPIPEN 2-PAK 0.3 MG/0.3ML SOAJ injection Inject 0.3 mg into the muscle as needed for anaphylaxis.   1   escitalopram (LEXAPRO) 20 MG tablet Take 1 tablet (20 mg total) by mouth daily. 90 tablet 3   estrogen, conjugated,-medroxyprogesterone (PREMPRO) 0.45-1.5 MG per tablet Take 1 tablet by mouth at bedtime.      fluconazole (DIFLUCAN) 200 MG tablet Take 1 tablet (200 mg total) by mouth daily as needed. 1 tablet 0   fluticasone (FLONASE) 50 MCG/ACT nasal spray Place 2 sprays into both nostrils in the morning and at bedtime.     ipratropium (ATROVENT) 0.03 % nasal spray Place 2 sprays into both nostrils every 12 (twelve) hours. (Patient taking differently: Place 2 sprays into both nostrils 2 (two) times daily as needed (allergies.).) 30 mL 12   lansoprazole (PREVACID) 30 MG capsule TAKE 1 CAPSULE(30 MG) BY MOUTH TWICE DAILY BEFORE A MEAL 60 capsule 6   losartan (COZAAR) 50 MG tablet Take 1 tablet (50 mg total) by mouth daily. 90 tablet 3   montelukast (SINGULAIR) 10 MG tablet Take 1 tablet (10 mg total) by mouth at bedtime. 90 tablet 3   predniSONE (DELTASONE) 20 MG tablet Take 1 tablet (20 mg total) by mouth daily with breakfast. 5 tablet 0   Prenatal Vit-Fe Fumarate-FA (PRENATAL VITAMIN PO) Take 1 tablet by mouth every evening.     Probiotic Product (PROBIOTIC COLON SUPPORT PO) Take 1 capsule by mouth at bedtime.      Spacer/Aero-Holding Chambers (VALVED HOLDING CHAMBER) DEVI SMARTSIG:Inhalation Via Inhaler As Directed     No current facility-administered medications on file prior to visit.    Observations/Objective: Alert, NAD,  appropriate mood and affect, resps normal, cn 2-12 intact, moves all 4s, no visible rash or swelling Lab Results  Component Value Date   WBC 7.7 08/29/2021   HGB 13.2 08/29/2021   HCT 38.0 08/29/2021   PLT 224 08/29/2021   GLUCOSE 94 11/19/2021   CHOL 129 08/28/2021   TRIG 176.0 (H) 08/28/2021   HDL 40.70 08/28/2021   LDLDIRECT 102.0 09/06/2018   LDLCALC 53 08/28/2021   ALT 20 11/19/2021   AST 23 11/19/2021   NA 140 11/19/2021   K 3.8 11/19/2021   CL 103 11/19/2021   CREATININE 0.82 11/19/2021   BUN 14 11/19/2021   CO2 28 11/19/2021   TSH 1.85 01/21/2021   HGBA1C 5.9 08/28/2021   MICROALBUR 26.3 (H) 02/12/2022   Assessment and Plan: See notes  Follow Up Instructions: See notes   I discussed the assessment and treatment plan with the patient. The patient was provided an opportunity to ask questions and  all were answered. The patient agreed with the plan and demonstrated an understanding of the instructions.   The patient was advised to call back or seek an in-person evaluation if the symptoms worsen or if the condition fails to improve as anticipated.   Cathlean Cower, MD

## 2022-05-13 NOTE — Telephone Encounter (Signed)
Patient called and said that the cough medicine that Dr. Jenny Reichmann prescribed today is not covered by her insurance.  She would like something else called in - she states that she doesn't know what he called in today but she has taken the perles before.  Please send to [facility] at [facility] [place]

## 2022-05-13 NOTE — Telephone Encounter (Signed)
Mychart message sent by pt:  [facility] (supporting Freddi Starr, MD)16 hours ago (9:56 PM)    My sinus infection from early Dec is back or never cleared up completely. Having headaches, sinus pressure and pain.  Can Dr Erin Fulling call in the same meds again? Thank you, Waynard Edwards     Dr. Erin Fulling, please advise on this.  Looks like pt was prescribed Augmentin last on 03/25/22 as well as pred taper and diflucan.

## 2022-05-14 ENCOUNTER — Encounter: Payer: Self-pay | Admitting: Internal Medicine

## 2022-05-14 DIAGNOSIS — T7840XA Allergy, unspecified, initial encounter: Secondary | ICD-10-CM | POA: Insufficient documentation

## 2022-05-14 NOTE — Assessment & Plan Note (Signed)
Mild to mod, for antibx course paxlovid, cough med prn,,  to f/u any worsening symptoms or concerns

## 2022-05-14 NOTE — Assessment & Plan Note (Signed)
Pt to continue inhaler prn

## 2022-05-14 NOTE — Assessment & Plan Note (Signed)
Mild to mod, for prednisone taper course,  to f/u any worsening symptoms or concerns

## 2022-05-14 NOTE — Assessment & Plan Note (Signed)
Pt asked to hold crestor for 5 days with taking paxlovid

## 2022-05-14 NOTE — Patient Instructions (Signed)
Please take all new medication as prescribed 

## 2022-05-19 MED ORDER — GUAIFENESIN-DM 100-10 MG/5ML PO SYRP: 5.0000 mL | ORAL_SOLUTION | ORAL | 1 refills | Status: DC | PRN

## 2022-05-19 NOTE — Telephone Encounter (Signed)
Called pt no answer LMOM w/MD replace.Marland KitchenJohny Andrews

## 2022-05-19 NOTE — Telephone Encounter (Signed)
Ok for robitussin DM -= done erx 

## 2022-05-25 DIAGNOSIS — J3081 Allergic rhinitis due to animal (cat) (dog) hair and dander: Secondary | ICD-10-CM | POA: Diagnosis not present

## 2022-05-25 DIAGNOSIS — J301 Allergic rhinitis due to pollen: Secondary | ICD-10-CM | POA: Diagnosis not present

## 2022-05-25 DIAGNOSIS — J3089 Other allergic rhinitis: Secondary | ICD-10-CM | POA: Diagnosis not present

## 2022-05-26 ENCOUNTER — Ambulatory Visit: Payer: PPO | Attending: Internal Medicine | Admitting: Internal Medicine

## 2022-05-26 ENCOUNTER — Encounter: Payer: Self-pay | Admitting: Internal Medicine

## 2022-05-26 VITALS — BP 118/82 | HR 98 | Resp 16 | Ht 62.0 in | Wt 141.0 lb

## 2022-05-26 DIAGNOSIS — M3501 Sicca syndrome with keratoconjunctivitis: Secondary | ICD-10-CM | POA: Diagnosis not present

## 2022-05-26 DIAGNOSIS — Z79899 Other long term (current) drug therapy: Secondary | ICD-10-CM | POA: Diagnosis not present

## 2022-05-26 DIAGNOSIS — J849 Interstitial pulmonary disease, unspecified: Secondary | ICD-10-CM | POA: Diagnosis not present

## 2022-05-26 NOTE — Progress Notes (Signed)
Office Visit Note  Patient: Sheri Andrews             Date of Birth: [birth year]           MRN: [id number]             PCP: [name], MD Referring: [name], * Visit Date: 05/26/2022   Subjective:  Follow-up (Patient states shortness of breathe and palpitations.)   History of Present Illness: Sheri Andrews is a 72 y.o. female here for follow up for Sjogren's syndrome with arthralgias, sicca symptoms, and associated ILD lung changes.  She had repeat CT scan in December that looked good with no progression of pulmonary changes on imaging.  Also had pulmonary clinic follow-up in December that looked good off maintenance treatment.  She was ill with COVID again treated with a course of Paxlovid also some complication with sinus infection to treated with Augmentin prednisone and single dose of Diflucan.  Did not get his sick as with her previous COVID illness but still has some lingering symptoms.  At this point she is back to doing just her inhaler therapy off any steroids.  Does have some left-sided jaw pain has not noticed any particular lesions in her mouth or jaw swelling.  Previous HPI 11/25/21 Sheri Andrews is a 72 y.o. female here for follow up sjogren's syndrome with arthralgias, sicca symptoms, and ILD.  Since her last visit she noticed foamy appearance changes to her urine and had greater than 1 g proteinuria.  Hydroxychloroquine was discontinued with improvement of the proteinuria.  However despite this improving her ongoing GI intolerance including a large weight loss this caused her to discontinue the Ofev.  Currently planning to wait a month before starting alternative medication for ILD according to pulmonology planning. Has not had any return of swelling or symptoms bothering her around the salivary glands or preauricular cyst since seeing ENT for this problem earlier.   Previous HPI 08/20/2021 Sheri Andrews is a 72 y.o. female here for follow up for  sjogren's syndrome with arthralgias, sicca symptoms, and suspected CTD-ILD. Since our last visit she is doing about the same starting Ofev treatment with Dr. Vaughan Browner. She saw Dr. Wilburn Cornelia completed course of antibiotics and had CT imaging of her head no evidence of swelling involvement to her salivary glands or lymph nodes appears more consistent for preauricular cyst.   Previous HPI 06/27/21 Sheri Andrews is a 72 y.o. female here for evaluation of Sjogren's syndrome. She had recent positive ANA and SSA Abs checked associated with new diagnosis of ILD. She has a previous history of moderate persistent asthma, GAVE, and barrett's esophagus. She had December visit with Dr. Erin Fulling for moderate persistent asthma with obstructive PFT findings and HRCT demonstrated definite changes compared to 2021. So far PFTs have not demonstrated any significant restrictive changes. Besides pulmonary issues she has a longstanding history of dry eyes and mouth complaints. She attributed this to her chronic allergic rhinitis and conjunctivitis and frequent sinusitis infections. She has taken allergy shots and frequent antihistamine medication for these problems. She sees ophthalmology recent exam with cataracts and also has some epiretinal membrane changes in both eyes. She mostly treats dry eyes with artificial tears/systane/refresh. She estimates dry mouth symptoms are since about 20 years ago. Just drinks a lot of fluid to treat this. She has numerous crowns no particular mouth ulcers or lesions. She has had oral thrush but was attributed to steroid inhaler for asthma.  She has dysphagia reports inability to eat without drinking fluids between solid foods. 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 Silverio Decamp for this problem. She takes PPI long term for heartburn and cough. She has mild severity of joint pain increased in her hands within the past few  years. Does not have prolonged morning stiffness. Sometimes swelling in bilateral hands not associated with numbness or decreased strength. She notices some redness and nodules in distal finger joints. Intermittently swelling in the feet and ankles nothing persistently. She has some progressive 1st MTP bunions and left lateral foot tendonitis. Right cyst in front of right ear this is very chronic she discussed surgical removal with dermatologist a year ago. She sometimes expresses drainage from this during increased swelling. Recently worse with tenderness, swelling, and erythema. She denies any lymph node swelling, fevers, skin rashes, or raynaud's symptoms. Most recent metabolic panel eGFR 59 no history of problems. Most recent CBC slightly elevated relative lymphocytes 52.6% normal total level.   Labs reviewed ANA 1:40 speckled SSA 6.4 dsDNA, SSB, Scl-70 neg RF neg CCP neg ANCA neg CBC 52.6% lymphs   Review of Systems  Constitutional:  Positive for fatigue.  HENT:  Positive for mouth dryness. Negative for mouth sores.   Eyes:  Positive for dryness.  Respiratory:  Positive for shortness of breath.   Cardiovascular:  Positive for palpitations. Negative for chest pain.  Gastrointestinal:  Positive for constipation and diarrhea. Negative for blood in stool.  Endocrine: Negative for increased urination.  Genitourinary:  Negative for involuntary urination.  Musculoskeletal:  Negative for joint pain, gait problem, joint pain, joint swelling, myalgias, muscle weakness, morning stiffness, muscle tenderness and myalgias.  Skin:  Negative for color change, rash, hair loss and sensitivity to sunlight.  Allergic/Immunologic: Negative for susceptible to infections.  Neurological:  Positive for headaches. Negative for dizziness.  Hematological:  Negative for swollen glands.  Psychiatric/Behavioral:  Positive for sleep disturbance. Negative for depressed mood. The patient is nervous/anxious.      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   High risk medication use 06/27/2021   ILD (interstitial lung disease) ([place]) 05/12/2021   Left foot pain 01/24/2021   Right epiretinal membrane 01/03/2020   Nuclear sclerotic cataract of right eye 01/03/2020   Nuclear sclerotic cataract of left eye 01/03/2020   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   Anatomical narrow angle of right eye 01/03/2020   GAVE (gastric antral vascular ectasia)    Iron deficiency anemia due to chronic blood loss    Chronic pansinusitis 06/22/2018   Nasal turbinate hypertrophy 06/22/2018   Asthma 05/25/2018   Essential hypertension 06/29/2017   Routine general medical examination at a health care facility 05/15/2016   Family history of early CAD 03/09/2014   Osteopenia 02/01/2012   BARRETTS ESOPHAGUS 10/08/2008   NEPHROLITHIASIS 04/30/2007   OCD (obsessive compulsive disorder) 04/11/2007   HYPERCHOLESTEROLEMIA 04/08/2007   Gastroesophageal reflux disease 04/08/2007   IRRITABLE BOWEL SYNDROME 04/08/2007    Past Medical History:  Diagnosis Date  Acid reflux disease    Allergic rhinitis    Anxiety    Asthma    Barrett esophagus    Dyspnea    Dysthymia    Hypercholesterolemia    Hypertension    Irritable bowel syndrome    Nephrolithiasis    Osteopenia    Osteoporosis    Other chest pain    Pulmonary fibrosis (HCC)    Sjogren's disease ([place])    Status post dilation of esophageal narrowing     Family History  Problem Relation Age of Onset   Heart disease Mother     Diabetes Mother    Heart disease Father    Diabetes Sister    Heart disease Sister    Diabetes Brother    Heart disease Brother    Heart disease Brother    Cancer Maternal Uncle        type unknown   Throat cancer Paternal Uncle    Colon cancer Neg Hx    Stomach cancer Neg Hx    Rectal cancer Neg Hx    Pancreatic cancer Neg Hx    Past Surgical History:  Procedure Laterality Date   ESOPHAGOGASTRODUODENOSCOPY (EGD) WITH PROPOFOL N/A 05/02/2019   Procedure: ESOPHAGOGASTRODUODENOSCOPY (EGD) WITH PROPOFOL;  Surgeon: Mauri Pole, MD;  Location: [facility];  Service: Endoscopy;  Laterality: N/A;  needing APC available   ESOPHAGOGASTRODUODENOSCOPY (EGD) WITH PROPOFOL N/A 08/11/2021   Procedure: ESOPHAGOGASTRODUODENOSCOPY (EGD) WITH PROPOFOL;  Surgeon: Mauri Pole, MD;  Location: [facility];  Service: Gastroenterology;  Laterality: N/A;   FACIAL COSMETIC SURGERY     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 [place]   Social History   Social History Narrative   Not on file   Immunization History  Administered Date(s) Administered   Fluad Quad(high Dose 65+) 01/21/2021, 02/12/2022   Influenza Split 02/01/2012   Influenza Whole 01/31/2009   Influenza, High Dose Seasonal PF 02/17/2016, 01/25/2017, 01/23/2019, 01/30/2020, 02/11/2021   Influenza,inj,Quad PF,6+ Mos 03/28/2015   Influenza,inj,quad, With Preservative 01/19/2019   Influenza-Unspecified 02/01/2017   PFIZER(Purple Top)SARS-COV-2 Vaccination 05/25/2019, 06/21/2019, 02/27/2020   Pneumococcal Conjugate-13 03/28/2015   Pneumococcal Polysaccharide-23 11/11/2015, 05/14/2016, 01/30/2020, 02/11/2021   Tdap 08/17/2011   Zoster Recombinat (Shingrix) 07/14/2017, 11/11/2017     Objective: Vital Signs: BP 118/82 (BP Location: Left Arm, Patient Position: Sitting, Cuff Size: Small)   Pulse 98   Resp 16   Ht 5' 2"$  (1.575 m)   Wt 141 lb (64 kg)   BMI 25.79 kg/m    Physical Exam Eyes:      Conjunctiva/sclera: Conjunctivae normal.  Cardiovascular:     Rate and Rhythm: Normal rate and regular rhythm.  Pulmonary:     Effort: Pulmonary effort is normal.     Comments: Bibasilar inspiratory crackles with good air movement Musculoskeletal:     Right lower leg: No edema.     Left lower leg: No edema.  Lymphadenopathy:     Cervical: No cervical adenopathy.  Skin:    General: Skin is warm and dry.     Findings: No rash.  Neurological:     Mental Status: She is alert.  Psychiatric:        Mood and Affect: Mood normal.      Musculoskeletal Exam:  Shoulders full ROM no tenderness or swelling Elbows full ROM no tenderness or swelling Wrists full ROM no tenderness or swelling Fingers full ROM no tenderness or swelling, Heberden's nodes present right second and  third DIP joints Knees full ROM no tenderness or swelling Ankles full ROM no tenderness or swelling  Investigation: No additional findings.  Imaging: DG Knee AP/LAT W/Sunrise Right  Result Date: 06/09/2022 CLINICAL DATA:  Pain EXAM: RIGHT KNEE 3 VIEWS COMPARISON:  None Available. FINDINGS: Minimal degenerative changes in the patellofemoral compartment. No fracture, dislocation, bony lesion, or joint effusion. IMPRESSION: Minimal degenerative changes in the patellofemoral compartment. Electronically Signed   By: Dorise Bullion III M.D.   On: 06/09/2022 12:48    Recent Labs: Lab Results  Component Value Date   WBC 7.7 08/29/2021   HGB 13.2 08/29/2021   PLT 224 08/29/2021   NA 140 11/19/2021   K 3.8 11/19/2021   CL 103 11/19/2021   CO2 28 11/19/2021   GLUCOSE 94 11/19/2021   BUN 14 11/19/2021   CREATININE 0.82 11/19/2021   BILITOT 0.5 11/19/2021   ALKPHOS 40 11/19/2021   AST 23 11/19/2021   ALT 20 11/19/2021   PROT 7.2 11/19/2021   ALBUMIN 4.2 11/19/2021   CALCIUM 9.3 11/19/2021   GFRAA  08/11/2008    >60        The eGFR has been calculated using the MDRD equation. This calculation has not  been validated in all clinical situations. eGFR's persistently <60 mL/min signify possible Chronic Kidney Disease.    Speciality Comments: No specialty comments available.  Procedures:  No procedures performed Allergies: Amoxicillin, Wellbutrin [bupropion], Doxycycline hyclate, and Erythromycin ethylsuccinate   Assessment / Plan:     Visit Diagnoses: Sjogren's syndrome with keratoconjunctivitis sicca ([place]) - Plan: Sjogrens syndrome-A extractable nuclear antibody, C3 and C4, Sedimentation rate, C-reactive protein  Extrapulmonary symptoms appear pretty mild has dryness without any obvious inflammatory changes no lesions or exacerbation.  Additional symptomatic treatment such as [place] possible patient is not overly concerned to start new medication for this.  Recommend we recheck antibody titer and serum inflammatory markers for evidence of disease activity that would provide more indication needing to treat in which case mycophenolate would be next recommended therapy to try.  Will plan to have her come back and collect lab after another week or 2 due to recent COVID and sinus section illness may affect lab results.  ILD (interstitial lung disease) ([place])  Findings appear stable slightly better CT report slightly worse DLCO on PFTs from December workup.  Unfortunately had another round of upper respiratory infection.  Concern is post-COVID inflammatory changes versus CTD-ILD trying to work up disease activity as above.  Orders: Orders Placed This Encounter  Procedures   Sjogrens syndrome-A extractable nuclear antibody   C3 and C4   Sedimentation rate   C-reactive protein   No orders of the defined types were placed in this encounter.    Follow-Up Instructions: No follow-ups on file.   Collier Salina, MD  Note - This record has been created using [employer].  Chart creation errors have been sought, but may not always  have been located. Such creation errors do not  reflect on  the standard of medical care.

## 2022-06-08 ENCOUNTER — Ambulatory Visit (INDEPENDENT_AMBULATORY_CARE_PROVIDER_SITE_OTHER): Payer: PPO | Admitting: Sports Medicine

## 2022-06-08 ENCOUNTER — Ambulatory Visit (INDEPENDENT_AMBULATORY_CARE_PROVIDER_SITE_OTHER): Payer: PPO

## 2022-06-08 VITALS — BP 120/80 | Ht 62.0 in | Wt 149.0 lb

## 2022-06-08 DIAGNOSIS — M25561 Pain in right knee: Secondary | ICD-10-CM | POA: Diagnosis not present

## 2022-06-08 DIAGNOSIS — M1711 Unilateral primary osteoarthritis, right knee: Secondary | ICD-10-CM | POA: Diagnosis not present

## 2022-06-08 MED ORDER — MELOXICAM 15 MG PO TABS: 15.0000 mg | ORAL_TABLET | Freq: Every day | ORAL | 0 refills | Status: DC

## 2022-06-08 NOTE — Progress Notes (Signed)
Sheri Andrews D.[place] Phone: [phone]   Assessment and Plan:    1. 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  No history of knee pain, no MOI - X-ray obtained in clinic.  My interpretation: No acute fracture or dislocation.  Cortical irregularity seen on fibular head that does correlate with area of TTP.  Fabella present.  No significant degenerative change - Start meloxicam 15 mg daily x2 weeks.  If still having pain after 2 weeks, complete 3rd-week of meloxicam. May use remaining meloxicam as needed once daily for pain control.  Do not to use additional NSAIDs while taking meloxicam.  May use Tylenol [phone] mg 2 to 3 times a day for breakthrough pain. - Start HEP for knee and mobilization of fibular head - DG Knee AP/LAT W/Sunrise Right; Future    Pertinent previous records reviewed include none   Follow Up: 4 weeks for reevaluation.  If no improvement or worsening of symptoms, could consider physical therapy versus lower extremity OMT   Subjective:   I, Sheri Andrews, am serving as a Education administrator for Doctor Glennon Mac  Chief Complaint: right leg pain   HPI:   06/08/22 Patient is a 72 year old female complaining of right leg pain. Patient states that she has had pain since Saturday morning, she has pain behind the knee and calf, isnt able to flex or extend all the way due to pain, has been taking naproxen and that helped some, no MOI, no numbness or tingling, no swelling or warmth , pain does ache through the calf, has a hard time going up and down steps , does have antalgic gait    Relevant Historical Information: Hypertension, Barrett's esophagus, Sjogren's syndrome  Additional pertinent review of systems negative.   Current Outpatient Medications:     albuterol (VENTOLIN HFA) 108 (90 Base) MCG/ACT inhaler, 1-2 puff as needed, Disp: 18 g, Rfl: 3   Artificial Tear Solution (TEARS NATURALE OP), Place 1 drop into both eyes See admin instructions. Use 1 drop into both eyes (scheduled) in the morning & may use 3 times daily if needed for dry /irritated eyes., Disp: , Rfl:    atorvastatin (LIPITOR) 40 MG tablet, Take 1 tablet (40 mg total) by mouth daily., Disp: 90 tablet, Rfl: 2   azelastine (OPTIVAR) 0.05 % ophthalmic solution, INSTILL 1 DROP IN BOTH EYES TWICE DAILY AS NEEDED, Disp: 18 mL, Rfl: 0   Azelastine HCl 0.15 % SOLN, USE 2 SPRAYS IN EACH NOSTRIL IN THE MORNING AND AT BEDTIME (Patient taking differently: Place 2 sprays into both nostrils 2 (two) times daily as needed (allergies.).), Disp: 30 mL, Rfl: 0   benzonatate (TESSALON PERLES) 100 MG capsule, Take 1 capsule (100 mg total) by mouth 3 (three) times daily as needed., Disp: 30 capsule, Rfl: 0   budesonide-formoterol (SYMBICORT) 160-4.5 MCG/ACT inhaler, Inhale 2 puffs into the lungs 2 (two) times daily. (Patient taking differently: Inhale 2 puffs into the lungs 2 (two) times daily as needed (respiratory issues.).), Disp: 1 each, Rfl: 6   clonazePAM (KLONOPIN) 0.25 MG disintegrating tablet, DISSOLVE ONE TABLET BY MOUTH EVERY DAY AS NEEDED, Disp: 30 tablet, Rfl: 3   EPIPEN 2-PAK 0.3 MG/0.3ML SOAJ injection, Inject 0.3 mg into the muscle as needed for anaphylaxis. , Disp: , Rfl: 1   escitalopram (LEXAPRO) 20 MG  tablet, Take 1 tablet (20 mg total) by mouth daily., Disp: 90 tablet, Rfl: 3   estrogen, conjugated,-medroxyprogesterone (PREMPRO) 0.45-1.5 MG per tablet, Take 1 tablet by mouth at bedtime. , Disp: , Rfl:    fluconazole (DIFLUCAN) 200 MG tablet, Take 1 tablet (200 mg total) by mouth daily as needed., Disp: 1 tablet, Rfl: 0   fluticasone (FLONASE) 50 MCG/ACT nasal spray, Place 2 sprays into both nostrils in the morning and at bedtime., Disp: , Rfl:    ipratropium (ATROVENT) 0.03 % nasal  spray, Place 2 sprays into both nostrils every 12 (twelve) hours. (Patient taking differently: Place 2 sprays into both nostrils 2 (two) times daily as needed (allergies.).), Disp: 30 mL, Rfl: 12   lansoprazole (PREVACID) 30 MG capsule, TAKE 1 CAPSULE(30 MG) BY MOUTH TWICE DAILY BEFORE A MEAL, Disp: 60 capsule, Rfl: 6   losartan (COZAAR) 50 MG tablet, Take 1 tablet (50 mg total) by mouth daily., Disp: 90 tablet, Rfl: 3   montelukast (SINGULAIR) 10 MG tablet, Take 1 tablet (10 mg total) by mouth at bedtime., Disp: 90 tablet, Rfl: 3   Prenatal Vit-Fe Fumarate-FA (PRENATAL VITAMIN PO), Take 1 tablet by mouth every evening., Disp: , Rfl:    Probiotic Product (PROBIOTIC COLON SUPPORT PO), Take 1 capsule by mouth at bedtime. , Disp: , Rfl:    Spacer/Aero-Holding Chambers (VALVED HOLDING CHAMBER) DEVI, SMARTSIG:Inhalation Via Inhaler As Directed, Disp: , Rfl:    TRELEGY ELLIPTA 200-62.5-25 MCG/ACT AEPB, Take 1 puff by mouth daily., Disp: , Rfl:    amoxicillin-clavulanate (AUGMENTIN) 875-125 MG tablet, Take 1 tablet by mouth 2 (two) times daily. (Patient not taking: Reported on 05/26/2022), Disp: 14 tablet, Rfl: 0   guaiFENesin-dextromethorphan (ROBITUSSIN DM) 100-10 MG/5ML syrup, Take 5 mLs by mouth every 4 (four) hours as needed for cough. (Patient not taking: Reported on 05/26/2022), Disp: 118 mL, Rfl: 1   predniSONE (DELTASONE) 20 MG tablet, Take 1 tablet (20 mg total) by mouth daily with breakfast., Disp: 5 tablet, Rfl: 0   Objective:     Vitals:   06/08/22 1327  BP: 120/80  Weight: 149 lb (67.6 kg)  Height: 5' 2"$  (1.575 m)      Body mass index is 27.25 kg/m.    Physical Exam:    General:  awake, alert oriented, no acute distress nontoxic Skin: no suspicious lesions or rashes Neuro:sensation intact and strength 5/5 with no deficits, no atrophy, normal muscle tone Psych: No signs of anxiety, depression or other mood disorder  Right knee:  No swelling No deformity Neg fluid wave, joint  milking ROM Flex 110, Ext 0 TTP fibular head, proximal calf musculature, lateral femoral condyle NTTP over the quad tendon, medial fem condyle, lat fem condyle, patella, plica, patella tendon, tibial tuberostiy,  posterior fossa, pes anserine bursa, gerdy's tubercle, medial jt line, lateral jt line Neg anterior and posterior drawer Neg lachman Neg sag sign Negative varus stress Negative valgus stress Negative McMurray Negative Thessaly Pain through calf with single-leg weightbearing heel raise Gait antalgic, favoring left leg   Electronically signed by:  Sheri Andrews D.[facility] 1:50 PM 06/08/22

## 2022-06-08 NOTE — Patient Instructions (Signed)
Good to see you  - Start meloxicam 15 mg daily x2 weeks.  If still having pain after 2 weeks, complete 3rd-week of meloxicam. May use remaining meloxicam as needed once daily for pain control.  Do not to use additional NSAIDs while taking meloxicam.  May use Tylenol [phone] mg 2 to 3 times a day for breakthrough pain. Knee HEP  4 week follow up

## 2022-06-09 ENCOUNTER — Encounter (INDEPENDENT_AMBULATORY_CARE_PROVIDER_SITE_OTHER): Payer: PPO | Admitting: Ophthalmology

## 2022-06-09 ENCOUNTER — Encounter (INDEPENDENT_AMBULATORY_CARE_PROVIDER_SITE_OTHER): Payer: Self-pay

## 2022-06-09 ENCOUNTER — Telehealth: Payer: Self-pay | Admitting: Internal Medicine

## 2022-06-09 NOTE — Telephone Encounter (Signed)
To be as accurate as possible would be best to check this off the meloxicam for 5 days. This is not an absolute requirement, so if she has issues with finishing this in 2 weeks as planned we could go ahead to check this just might not be as sensitive

## 2022-06-09 NOTE — Telephone Encounter (Signed)
Patient called stating Dr. Benjamine Mola wanted her to have labwork next week 06/15/22, but was just prescribed Meloxicam 1 tablet/day for 2 weeks by Dr. Glennon Mac for her leg pain.  Patient requested a return call to let her know if she needs to wait on labwork until she finishes the medication.

## 2022-06-09 NOTE — Telephone Encounter (Signed)
I called patient, patient verbalized understanding. 

## 2022-06-17 ENCOUNTER — Encounter: Payer: Self-pay | Admitting: Neurology

## 2022-06-17 ENCOUNTER — Ambulatory Visit: Payer: PPO | Admitting: Neurology

## 2022-06-17 VITALS — BP 125/88 | HR 89 | Ht 63.0 in | Wt 138.2 lb

## 2022-06-17 DIAGNOSIS — H5460 Unqualified visual loss, one eye, unspecified: Secondary | ICD-10-CM | POA: Diagnosis not present

## 2022-06-17 DIAGNOSIS — G8929 Other chronic pain: Secondary | ICD-10-CM | POA: Diagnosis not present

## 2022-06-17 DIAGNOSIS — R519 Headache, unspecified: Secondary | ICD-10-CM

## 2022-06-17 DIAGNOSIS — G43901 Migraine, unspecified, not intractable, with status migrainosus: Secondary | ICD-10-CM

## 2022-06-17 DIAGNOSIS — R51 Headache with orthostatic component, not elsewhere classified: Secondary | ICD-10-CM | POA: Diagnosis not present

## 2022-06-17 MED ORDER — ZAVZPRET 10 MG/ACT NA SOLN: 1.0000 | Freq: Once | NASAL | 0 refills | Status: AC

## 2022-06-17 MED ORDER — UBRELVY 100 MG PO TABS: 100.0000 mg | ORAL_TABLET | Freq: Two times a day (BID) | ORAL | 0 refills | Status: AC

## 2022-06-17 NOTE — Patient Instructions (Addendum)
MRI of the brain w/wo contrast MRA of the head Carotid dopplers Blood work IF negative I would treat for migraines and see if we can resolve this headache.  Zavzpret today in clinic and daily For the next 2 days ubrelvy twice daily     There is increased risk for stroke in women with migraine with aura and a contraindication for the combined contraceptive pill for use by women who have migraine with aura. The risk for women with migraine without aura is lower. However other risk factors like smoking are far more likely to increase stroke risk than migraine. There is a recommendation for no smoking and for the use of OCPs without estrogen such as progestogen only pills particularly for women with migraine with aura.Marland Kitchen People who have migraine headaches with auras may be 3 times more likely to have a stroke caused by a blood clot, compared to migraine patients who don't see auras. Women who take hormone-replacement therapy may be 30 percent more likely to suffer a clot-based stroke than women not taking medication containing estrogen. Other risk factors like smoking and high blood pressure may be  much more important."  Migraine Headache A migraine headache is an intense pulsing or throbbing pain on one or both sides of the head. Migraine headaches may also cause other symptoms, such as nausea, vomiting, and sensitivity to light and noise. A migraine headache can last from 4 hours to 3 days. Talk with your health care provider about what things may bring on (trigger) your migraine headaches. What are the causes? The exact cause is not known. However, a migraine may be caused when nerves in the brain get irritated and release chemicals that cause blood vessels to become inflamed. This inflammation causes pain. Migraines may be triggered or caused by: Smoking. Medicines, such as: Nitroglycerin, which is used to treat chest pain. Birth control pills. Estrogen. Certain blood pressure medicines. Foods  or drinks that contain nitrates, glutamate, aspartame, MSG, or tyramine. Certain foods or drinks, such as aged cheeses, chocolate, alcohol, or caffeine. Doing physical activity that is very hard. Other triggers may include: Menstruation. Pregnancy. Hunger. Stress. Getting too much or too little sleep. Weather changes. Tiredness (fatigue). What increases the risk? The following factors may make you more likely to have migraine headaches: Being between the ages of 60-39 years old. Being female. Having a family history of migraine headaches. Being Caucasian. Having a mental health condition, such as depression or anxiety. Being obese. What are the signs or symptoms? The main symptom of this condition is pulsing or throbbing pain. This pain may: Happen in any area of the head, such as on one or both sides. Make it hard to do daily activities. Get worse with physical activity. Get worse around bright lights, loud noises, or smells. Other symptoms may include: Nausea. Vomiting. Dizziness. Before a migraine headache starts, you may get warning signs (an aura). An aura may include: Seeing flashing lights or having blind spots. Seeing bright spots, halos, or zigzag lines. Having tunnel vision or blurred vision. Having numbness or a tingling feeling. Having trouble talking. Having muscle weakness. After a migraine ends, you may have symptoms. These may include: Feeling tired. Trouble concentrating. How is this diagnosed? A migraine headache can be diagnosed based on: Your symptoms. A physical exam. Tests, such as: A CT scan or an MRI of the head. These tests can help rule out other causes of headaches. Taking fluid from the spine (lumbar puncture) to examine it (cerebrospinal fluid  analysis, or CSF analysis). How is this treated? This condition may be treated with medicines that: Relieve pain and nausea. Prevent migraines. Treatment may also include: Acupuncture. Lifestyle  changes like avoiding foods that trigger migraine headaches. Learning ways to control your body (biofeedback). Talk therapy to help you know and deal with negative thoughts (cognitive behavioral therapy). Follow these instructions at home: Medicines Take over-the-counter and prescription medicines only as told by your provider. Ask your provider if the medicine prescribed to you: Requires you to avoid driving or using machinery. Can cause constipation. You may need to take these actions to prevent or treat constipation: Drink enough fluid to keep your pee (urine) pale yellow. Take over-the-counter or prescription medicines. Eat foods that are high in fiber, such as beans, whole grains, and fresh fruits and vegetables. Limit foods that are high in fat and processed sugars, such as fried or sweet foods. Lifestyle  Do not drink alcohol. Do not use any products that contain nicotine or tobacco. These products include cigarettes, chewing tobacco, and vaping devices, such as e-cigarettes. If you need help quitting, ask your provider. Get 7-9 hours of sleep each night, or the amount recommended by your provider. Find ways to manage stress, such as meditation, deep breathing, or yoga. Try to exercise regularly. This can help lessen how bad and how often your migraines occur. General instructions Keep a journal to find out what triggers your migraines, so you can avoid those things. For example, write down: What you eat and drink. How much sleep you get. Any change to your diet or medicines. If you have a migraine headache: Avoid things that make your symptoms worse, such as bright lights. Lie down in a dark, quiet room. Do not drive or use machinery. Ask your provider what activities are safe for you while you have symptoms. Keep all follow-up visits. Your provider will monitor your symptoms and recommend any further treatment. Where to find more information Coalition for Headache and  Migraine Patients (CHAMP): headachemigraine.org American Migraine Foundation: americanmigrainefoundation.org National Headache Foundation: headaches.org Contact a health care provider if: You have symptoms that are different or worse than your usual migraine headache symptoms. You have more than 15 days of headaches in one month. Get help right away if: Your migraine headache becomes severe or lasts more than 72 hours. You have a fever or stiff neck. You have vision loss. Your muscles feel weak or like you cannot control them. You lose your balance often or have trouble walking. You faint. You have a seizure. This information is not intended to replace advice given to you by your health care provider. Make sure you discuss any questions you have with your health care provider. Document Revised: 12/01/2021 Document Reviewed: 12/01/2021 Elsevier Patient Education  [place] Tablets What is this medication? UBROGEPANT (ue BROE je pant) treats migraines. It works by blocking a substance in the body that causes migraines. It is not used to prevent migraines. This medicine may be used for other purposes; ask your health care provider or pharmacist if you have questions. COMMON BRAND NAME(S): Roselyn Meier What should I tell my care team before I take this medication? They need to know if you have any of these conditions: Kidney disease Liver disease An unusual or allergic reaction to ubrogepant, other medications, foods, dyes, or preservatives Pregnant or trying to get pregnant Breast-feeding How should I use this medication? Take this medication by mouth with a glass of water. Take it as directed  on the prescription label. You can take it with or without food. If it upsets your stomach, take it with food. Keep taking it unless your care team tells you to stop. Talk to your care team about the use of this medication in children. Special care may be needed. Overdosage: If  you think you have taken too much of this medicine contact a poison control center or emergency room at once. NOTE: This medicine is only for you. Do not share this medicine with others. What if I miss a dose? This does not apply. This medication is not for regular use. What may interact with this medication? Do not take this medication with any of the following: Adagrasib Ceritinib Certain antibiotics, such as chloramphenicol, clarithromycin, telithromycin Certain antivirals for HIV, such as atazanavir, cobicistat, darunavir, delavirdine, fosamprenavir, indinavir, ritonavir Certain medications for fungal infections, such as itraconazole, ketoconazole, posaconazole, voriconazole Conivaptan Grapefruit Idelalisib Mifepristone Nefazodone Ribociclib This medication may also interact with the following: Carvedilol Certain medications for seizures, such as phenobarbital, phenytoin Ciprofloxacin Cyclosporine Eltrombopag Fluconazole Fluvoxamine Quinidine Rifampin St. John's wort Verapamil This list may not describe all possible interactions. Give your health care provider a list of all the medicines, herbs, non-prescription drugs, or dietary supplements you use. Also tell them if you smoke, drink alcohol, or use illegal drugs. Some items may interact with your medicine. What should I watch for while using this medication? Visit your care team for regular checks on your progress. Tell your care team if your symptoms do not start to get better or if they get worse. Your mouth may get dry. Chewing sugarless gum or sucking hard candy and drinking plenty of water may help. Contact your care team if the problem does not go away or is severe. What side effects may I notice from receiving this medication? Side effects that you should report to your care team as soon as possible: Allergic reactions--skin rash, itching, hives, swelling of the face, lips, tongue, or throat Side effects that usually  do not require medical attention (report to your care team if they continue or are bothersome): Drowsiness Dry mouth Fatigue Nausea This list may not describe all possible side effects. Call your doctor for medical advice about side effects. You may report side effects to FDA at 1-800-FDA-1088. Where should I keep my medication? Keep out of the reach of children and pets. Store between 15 and 30 degrees C (59 and 86 degrees F). Get rid of any unused medication after the expiration date. To get rid of medications that are no longer needed or have expired: Take the medication to a medication take-back program. Check with your pharmacy or law enforcement to find a location. If you cannot return the medication, check the label or package insert to see if the medication should be thrown out in the garbage or flushed down the toilet. If you are not sure, ask your care team. If it is safe to put it in the trash, pour the medication out of the container. Mix the medication with cat litter, dirt, coffee grounds, or other unwanted substance. Seal the mixture in a bag or container. Put it in the trash. NOTE: This sheet is a summary. It may not cover all possible information. If you have questions about this medicine, talk to your doctor, pharmacist, or health care provider.  2023 Elsevier/Gold Standard (2021-05-15 00:00:00) Zavegepant Nasal Spray What is this medication? ZAVEGEPANT (za VE je pant) treats migraines. It works by blocking a substance in the  body that causes migraines. It is not used to prevent migraines. This medicine may be used for other purposes; ask your health care provider or pharmacist if you have questions. COMMON BRAND NAME(S): ZAVZPRET What should I tell my care team before I take this medication? They need to know if you have any of these conditions: Kidney disease Liver disease An unusual or allergic reaction to zavegepant, other medications, foods, dyes, or  preservatives Pregnant or trying to get pregnant Breast-feeding How should I use this medication? This medication is for use in the nose. Take it as directed on the prescription label. Do not use it more often than directed. Make sure that you are using your nasal spray correctly. Ask your care team if you have any questions. Talk to your care team about the use of this medication in children. Special care may be needed. Overdosage: If you think you have taken too much of this medicine contact a poison control center or emergency room at once. NOTE: This medicine is only for you. Do not share this medicine with others. What if I miss a dose? This does not apply. This medication is not for regular use. It should only be used as needed. What may interact with this medication? Decongestant nasal sprays This medication may affect how other medications work, and other medications may affect the way this medication works. Talk with your care team about all of the medications you take. They may suggest changes to your treatment plan to lower the risk of side effects and to make sure your medications work as intended. This list may not describe all possible interactions. Give your health care provider a list of all the medicines, herbs, non-prescription drugs, or dietary supplements you use. Also tell them if you smoke, drink alcohol, or use illegal drugs. Some items may interact with your medicine. What should I watch for while using this medication? Visit your care team for regular checks on your progress. Tell your care team if your symptoms do not start to get better or if they get worse. What side effects may I notice from receiving this medication? Side effects that you should report to your care team as soon as possible: Allergic reactions--skin rash, itching, hives, swelling of the face, lips, tongue, or throat Side effects that usually do not require medical attention (report to your care team if  they continue or are bothersome): Change in taste Dryness or irritation inside the nose Nausea Vomiting This list may not describe all possible side effects. Call your doctor for medical advice about side effects. You may report side effects to FDA at 1-800-FDA-1088. Where should I keep my medication? Keep out of the reach of children and pets. Store at room temperature between 20 and 25 degrees C (68 and 77 degrees F). Do not freeze. Get rid of any unused medication after the expiration date. To get rid of medications that are no longer needed or have expired: Take the medication to a medication take-back program. Check with your pharmacy or law enforcement to find a location. If you cannot return the medication, ask your pharmacist or care team how to get rid of this medication safely. NOTE: This sheet is a summary. It may not cover all possible information. If you have questions about this medicine, talk to your doctor, pharmacist, or health care provider.  2023 Elsevier/Gold Standard (2021-07-02 00:00:00)

## 2022-06-17 NOTE — Progress Notes (Unsigned)
[facility]    Provider:  Dr Jaynee Eagles Requesting Provider: Warden Fillers, MD Primary Care Provider:  [name], MD  CC:  vision changes  HPI:  Sheri Andrews is a 72 y.o. female here as requested by Warden Fillers, MD for headaches and vision changes. She started having triangles move from one eye to the other last fall and got bigger and bigger and bigger, headache followed, bad headache migrainous. She has a remote hx of migraines after red wine. Happened 3 times. About 30 minutes prior to headache. Behind the eyes, pulsating/pounding/throbbing, light sensitivity, no nausea, hurt to move, the vision changes resolved in an hour as did the headache but unclear because she fell asleep. Last one was unilateral vision changes stayed white complete monocular vision loss without a headache. Then went away but was was very different than the other episodes. Has not happened in a few months. She is having new pain in her right leg lower leg aching and numbness and right leg weakness trouble walking. Still having right leg pain radiating lateral leg and up to the hip. Still having pressure and pain in the right temporal area, continuous, pressure, behind the right eye. No other focal neurologic deficits, associated symptoms, inciting events or modifiable factors.  Reviewed bloodwork Also reviewed notes from ophthalmology, npo BRAO or other suspected etiology     Latest Ref Rng & Units 06/17/2022   12:14 PM 08/29/2021    1:15 PM 06/19/2021   12:39 PM  CBC  WBC 3.4 - 10.8 x10E3/uL 6.1  7.7  4.9   Hemoglobin 11.1 - 15.9 g/dL 14.2  13.2  13.4   Hematocrit 34.0 - 46.6 % 42.0  38.0  39.2   Platelets 150 - 450 x10E3/uL 266  224  239.0       Latest Ref Rng & Units 06/17/2022   12:14 PM 11/19/2021    1:04 PM 09/19/2021   11:01 AM  CMP  Glucose 70 - 99 mg/dL 98  94  95   BUN 8 - 27 mg/dL '16  14  13   '$ Creatinine 0.57 - 1.00 mg/dL 1.00  0.82  0.77   Sodium 134 - 144 mmol/L  142  140  139   Potassium 3.5 - 5.2 mmol/L 4.4  3.8  3.6   Chloride 96 - 106 mmol/L 104  103  106   CO2 20 - 29 mmol/L '25  28  26   '$ Calcium 8.7 - 10.3 mg/dL 9.9  9.3  9.3   Total Protein 6.0 - 8.5 g/dL 7.0  7.2  7.3   Total Bilirubin 0.0 - 1.2 mg/dL 0.3  0.5  0.4   Alkaline Phos 44 - 121 IU/L 50  40  47   AST 0 - 40 IU/L '21  23  28   '$ ALT 0 - 32 IU/L '15  20  27     '$ Review of Systems: Patient complains of symptoms per HPI as well as the following symptoms monocular vision loss. Pertinent negatives and positives per HPI. All others negative.   Social History   Socioeconomic History   Marital status: Married    Spouse name: Not on file   Number of children: 2   Years of education: Not on file   Highest education level: Not on file  Occupational History   Occupation: retired  Tobacco Use   Smoking status: Never    Passive exposure: Never   Smokeless tobacco: Never  Vaping Use   Vaping  Use: Never used  Substance and Sexual Activity   Alcohol use: Yes    Alcohol/week: 1.0 standard drink of alcohol    Types: 1 Standard drinks or equivalent per week    Comment: rare twice month   Drug use: No   Sexual activity: Yes    Partners: Male  Other Topics Concern   Not on file  Social History Narrative   Caffiene 2 cups daily.    Working retired Recruitment consultant PR   Social Determinants of Radio broadcast assistant Strain: [place]  (02/12/2022)   Overall Financial Resource Strain (CARDIA)    Difficulty of Paying Living Expenses: Not hard at all  Food Insecurity: No Food Insecurity (02/12/2022)   Hunger Vital Sign    Worried About Running Out of Food in the Last Year: Never true    Independence in the Last Year: Never true  Transportation Needs: No Transportation Needs (02/12/2022)   PRAPARE - Hydrologist (Medical): No    Lack of Transportation (Non-Medical): No  Physical Activity: Sufficiently Active (02/12/2022)   Exercise Vital Sign    Days of  Exercise per Week: 7 days    Minutes of Exercise per Session: 30 min  Stress: No Stress Concern Present (02/12/2022)   [place]    Feeling of Stress : Not at all  Social Connections: [place] (02/12/2022)   Social Connection and Isolation Panel [NHANES]    Frequency of Communication with Friends and Family: More than three times a week    Frequency of Social Gatherings with Friends and Family: More than three times a week    Attends Religious Services: More than 4 times per year    Active Member of [employer] or Organizations: Yes    Attends Music therapist: More than 4 times per year    Marital Status: Married  Human resources officer Violence: Not At Risk (02/12/2022)   Humiliation, Afraid, Rape, and Kick questionnaire    Fear of Current or Ex-Partner: No    Emotionally Abused: No    Physically Abused: No    Sexually Abused: No    Family History  Problem Relation Age of Onset   Heart disease Mother    Diabetes Mother    Heart disease Father    Diabetes Sister    Heart disease Sister    Diabetes Brother    Heart disease Brother    Heart disease Brother    Cancer Maternal Uncle        type unknown   Throat cancer Paternal Uncle    Colon cancer Neg Hx    Stomach cancer Neg Hx    Rectal cancer Neg Hx    Pancreatic cancer Neg Hx    Migraines Neg Hx     Past Medical History:  Diagnosis Date   Acid reflux disease    Allergic rhinitis    Anxiety    Asthma    Barrett esophagus    Dyspnea    Dysthymia    Hypercholesterolemia    Hypertension    Irritable bowel syndrome    Nephrolithiasis    Osteopenia    Osteoporosis    Other chest pain    Pulmonary fibrosis (HCC)    Sjogren's disease ([place])    Status post dilation of esophageal narrowing     Patient Active Problem List   Diagnosis Date Noted   Allergies 05/14/2022   COVID-19 virus  infection 05/13/2022   Plantar fasciitis  11/25/2021   Foamy urine 08/29/2021   Hormone replacement therapy 06/27/2021   Abnormal cervical Papanicolaou smear 06/27/2021   Anemia 06/27/2021   Colitis 06/27/2021   Depressive disorder 06/27/2021   Insomnia 06/27/2021   Sjogren's syndrome ([place]) 06/27/2021   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   High risk medication use 06/27/2021   ILD (interstitial lung disease) ([place]) 05/12/2021   Left foot pain 01/24/2021   Right epiretinal membrane 01/03/2020   Nuclear sclerotic cataract of right eye 01/03/2020   Nuclear sclerotic cataract of left eye 01/03/2020   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   Anatomical narrow angle of right eye 01/03/2020   GAVE (gastric antral vascular ectasia)    Iron deficiency anemia due to chronic blood loss    Chronic pansinusitis 06/22/2018   Nasal turbinate hypertrophy 06/22/2018   Asthma 05/25/2018   Essential hypertension 06/29/2017   Routine general medical examination at a health care facility 05/15/2016   Family history of early CAD 03/09/2014   Osteopenia 02/01/2012   BARRETTS ESOPHAGUS 10/08/2008   NEPHROLITHIASIS 04/30/2007   OCD (obsessive compulsive disorder) 04/11/2007   HYPERCHOLESTEROLEMIA 04/08/2007   Gastroesophageal reflux disease 04/08/2007   IRRITABLE BOWEL SYNDROME 04/08/2007    Past Surgical History:  Procedure Laterality Date   ESOPHAGOGASTRODUODENOSCOPY (EGD) WITH PROPOFOL N/A 05/02/2019   Procedure: ESOPHAGOGASTRODUODENOSCOPY (EGD) WITH PROPOFOL;  Surgeon: Mauri Pole, MD;  Location: [facility];  Service: Endoscopy;  Laterality: N/A;  needing APC available   ESOPHAGOGASTRODUODENOSCOPY (EGD) WITH PROPOFOL N/A 08/11/2021   Procedure: ESOPHAGOGASTRODUODENOSCOPY (EGD) WITH PROPOFOL;  Surgeon: Mauri Pole, MD;  Location: [facility];  Service: Gastroenterology;  Laterality: N/A;   FACIAL COSMETIC SURGERY     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 Use 1 drop into both eyes (scheduled) in the morning & may use 3 times daily if needed for dry /irritated eyes.     atorvastatin (LIPITOR) 40 MG tablet Take 1 tablet (40 mg total) by mouth daily. 90 tablet 2   azelastine (OPTIVAR) 0.05 % ophthalmic solution INSTILL 1 DROP IN BOTH EYES TWICE DAILY AS NEEDED 18 mL 0   Azelastine HCl 0.15 % SOLN USE 2 SPRAYS IN EACH NOSTRIL IN THE MORNING AND AT BEDTIME (Patient taking differently: Place 2 sprays into both nostrils 2 (two) times daily as needed (allergies.).) 30 mL 0   benzonatate (TESSALON PERLES) 100 MG capsule Take 1 capsule (100 mg total) by mouth 3 (three) times daily as needed. 30 capsule 0   budesonide-formoterol (SYMBICORT) 160-4.5 MCG/ACT inhaler Inhale 2 puffs into the lungs 2 (two) times daily. (Patient taking differently: Inhale 2 puffs into the lungs 2 (two) times daily as needed (respiratory issues.).) 1 each 6   clonazePAM (KLONOPIN) 0.25 MG disintegrating tablet DISSOLVE ONE TABLET BY MOUTH EVERY DAY AS NEEDED 30 tablet 3   EPIPEN 2-PAK 0.3 MG/0.3ML SOAJ injection Inject 0.3 mg into the muscle as needed for anaphylaxis.   1   escitalopram (LEXAPRO) 20 MG tablet Take 1 tablet (20 mg total) by mouth daily. 90 tablet 3   estrogen, conjugated,-medroxyprogesterone (PREMPRO) 0.45-1.5 MG per tablet Take 1 tablet by mouth at  bedtime.      fluticasone (FLONASE) 50 MCG/ACT nasal spray Place 2 sprays into both nostrils in the morning and at bedtime.     ipratropium (ATROVENT) 0.03 % nasal spray Place 2 sprays into both nostrils every 12 (twelve) hours. (Patient taking differently: Place 2 sprays into both nostrils 2 (two) times daily as needed (allergies.).) 30 mL 12   lansoprazole (PREVACID) 30 MG  capsule TAKE 1 CAPSULE(30 MG) BY MOUTH TWICE DAILY BEFORE A MEAL 60 capsule 6   losartan (COZAAR) 50 MG tablet Take 1 tablet (50 mg total) by mouth daily. 90 tablet 3   meloxicam (MOBIC) 15 MG tablet Take 1 tablet (15 mg total) by mouth daily. 30 tablet 0   montelukast (SINGULAIR) 10 MG tablet Take 1 tablet (10 mg total) by mouth at bedtime. 90 tablet 3   Multiple Vitamin (MULTIVITAMIN) capsule Take 1 capsule by mouth daily.     Probiotic Product (PROBIOTIC COLON SUPPORT PO) Take 1 capsule by mouth at bedtime.      Spacer/Aero-Holding Chambers (VALVED HOLDING CHAMBER) DEVI SMARTSIG:Inhalation Via Inhaler As Directed     TRELEGY ELLIPTA 200-62.5-25 MCG/ACT AEPB Take 1 puff by mouth daily.     Ubrogepant (UBRELVY) 100 MG TABS Take 1 tablet (100 mg total) by mouth in the morning and at bedtime. Maximum '200mg'$  a day. 4 tablet 0   No current facility-administered medications for this visit.    Allergies as of 06/17/2022 - Review Complete 06/17/2022  Allergen Reaction Noted   Amoxicillin Other (See Comments) 08/05/2021   Wellbutrin [bupropion] Swelling 06/27/2021   Doxycycline hyclate Other (See Comments) 02/25/2005   Erythromycin ethylsuccinate Other (See Comments) 02/25/2005    Vitals: BP 125/88   Pulse 89   Ht '5\' 3"'$  (1.6 m)   Wt 138 lb 3.2 oz (62.7 kg)   BMI 24.48 kg/m  Last Weight:  Wt Readings from Last 1 Encounters:  06/17/22 138 lb 3.2 oz (62.7 kg)   Last Height:   Ht Readings from Last 1 Encounters:  06/17/22 '5\' 3"'$  (1.6 m)     Physical exam: Exam: Gen: NAD, conversant, well nourised, well groomed                     CV: RRR, no MRG. No Carotid Bruits. No peripheral edema, warm, nontender Eyes: Conjunctivae clear without exudates or hemorrhage  Neuro: Detailed Neurologic Exam  Speech:    Speech is normal; fluent and spontaneous with normal comprehension.  Cognition:    The patient is oriented to person, place, and time;     recent and remote memory intact;      language fluent;     normal attention, concentration,     fund of knowledge Cranial Nerves:    The pupils are equal, round, and reactive to light. The fundi are normal and spontaneous venous pulsations are present. Visual fields are full to finger confrontation. Extraocular movements are intact. Trigeminal sensation is intact and the muscles of mastication are normal. The face is symmetric. The palate elevates in the midline. Hearing intact. Voice is normal. Shoulder shrug is normal. The tongue has normal motion without fasciculations.   Coordination:    Normal finger to nose and heel to shin. Normal rapid alternating movements.   Gait:    Heel-toe and tandem gait are normal.   Motor Observation:    No asymmetry, no atrophy, and no involuntary movements noted. Tone:    Normal muscle tone.    Posture:  Posture is normal. normal erect    Strength:    Strength is V/V in the upper and lower limbs.      Sensation: intact to LT     Reflex Exam:  DTR's:    Deep tendon reflexes in the upper and lower extremities are normal bilaterally.   Toes:    The toes are downgoing bilaterally.   Clonus:    Clonus is absent.    Assessment/Plan:  Likely migraine with aura however concerning symptoms monocular vision loss, right continuous temporal headache, new onset >50, new vision changes, intractable headache right temporal, vision changes right eye. Need MRI of the brain and MRA of the head and carotid dopplers.   MRI of the brain w/wo contrast MRA of the head Carotid dopplers Blood work IF negative I would treat for migraines and see if we can resolve this headache.  Zavzpret today in clinic and daily For the next 2 days ubrelvy twice daily   Orders Placed This Encounter  Procedures   Sedimentation rate   C-reactive protein   CBC with Differential/Platelets   Comprehensive metabolic panel   TSH Rfx on Abnormal to Free T4   Meds ordered this encounter  Medications   Zavegepant  HCl (ZAVZPRET) 10 MG/ACT SOLN    Sig: Place 1 spray into the nose once for 1 dose.    Dispense:  1 each    Refill:  0   Ubrogepant (UBRELVY) 100 MG TABS    Sig: Take 1 tablet (100 mg total) by mouth in the morning and at bedtime. Maximum '200mg'$  a day.    Dispense:  4 tablet    Refill:  0    Cc: Warden Fillers, MD,  [name], MD  Sarina Ill, MD  [facility] [address] [place], [place] [address]  Phone [phone] Fax [phone]

## 2022-06-18 ENCOUNTER — Telehealth: Payer: Self-pay | Admitting: Neurology

## 2022-06-18 LAB — CBC WITH DIFFERENTIAL/PLATELET
Basophils Absolute: 0 10*3/uL (ref 0.0–0.2)
Basos: 0 %
EOS (ABSOLUTE): 0.1 10*3/uL (ref 0.0–0.4)
Eos: 1 %
Hematocrit: 42 % (ref 34.0–46.6)
Hemoglobin: 14.2 g/dL (ref 11.1–15.9)
Immature Grans (Abs): 0 10*3/uL (ref 0.0–0.1)
Immature Granulocytes: 1 %
Lymphocytes Absolute: 2.3 10*3/uL (ref 0.7–3.1)
Lymphs: 38 %
MCH: 31.1 pg (ref 26.6–33.0)
MCHC: 33.8 g/dL (ref 31.5–35.7)
MCV: 92 fL (ref 79–97)
Monocytes Absolute: 0.4 10*3/uL (ref 0.1–0.9)
Monocytes: 7 %
Neutrophils Absolute: 3.3 10*3/uL (ref 1.4–7.0)
Neutrophils: 53 %
Platelets: 266 10*3/uL (ref 150–450)
RBC: 4.57 x10E6/uL (ref 3.77–5.28)
RDW: 13.3 % (ref 11.7–15.4)
WBC: 6.1 10*3/uL (ref 3.4–10.8)

## 2022-06-18 LAB — COMPREHENSIVE METABOLIC PANEL
ALT: 15 IU/L (ref 0–32)
AST: 21 IU/L (ref 0–40)
Albumin/Globulin Ratio: 1.9 (ref 1.2–2.2)
Albumin: 4.6 g/dL (ref 3.8–4.8)
Alkaline Phosphatase: 50 IU/L (ref 44–121)
BUN/Creatinine Ratio: 16 (ref 12–28)
BUN: 16 mg/dL (ref 8–27)
Bilirubin Total: 0.3 mg/dL (ref 0.0–1.2)
CO2: 25 mmol/L (ref 20–29)
Calcium: 9.9 mg/dL (ref 8.7–10.3)
Chloride: 104 mmol/L (ref 96–106)
Creatinine, Ser: 1 mg/dL (ref 0.57–1.00)
Globulin, Total: 2.4 g/dL (ref 1.5–4.5)
Glucose: 98 mg/dL (ref 70–99)
Potassium: 4.4 mmol/L (ref 3.5–5.2)
Sodium: 142 mmol/L (ref 134–144)
Total Protein: 7 g/dL (ref 6.0–8.5)
eGFR: 60 mL/min/{1.73_m2} (ref >59)

## 2022-06-18 LAB — SEDIMENTATION RATE: Sed Rate: 3 mm/hr (ref 0–40)

## 2022-06-18 LAB — C-REACTIVE PROTEIN: CRP: <1 mg/L (ref 0–10)

## 2022-06-18 NOTE — Telephone Encounter (Signed)
[place]althteam adv NPR sent to GI [phone]

## 2022-06-19 DIAGNOSIS — J3089 Other allergic rhinitis: Secondary | ICD-10-CM | POA: Diagnosis not present

## 2022-06-19 DIAGNOSIS — J301 Allergic rhinitis due to pollen: Secondary | ICD-10-CM | POA: Diagnosis not present

## 2022-06-19 LAB — TSH RFX ON ABNORMAL TO FREE T4: TSH: 2.12 u[IU]/mL (ref 0.450–4.500)

## 2022-06-20 ENCOUNTER — Encounter: Payer: Self-pay | Admitting: Neurology

## 2022-06-23 DIAGNOSIS — H40032 Anatomical narrow angle, left eye: Secondary | ICD-10-CM | POA: Diagnosis not present

## 2022-06-23 DIAGNOSIS — H2513 Age-related nuclear cataract, bilateral: Secondary | ICD-10-CM | POA: Diagnosis not present

## 2022-06-23 DIAGNOSIS — H35373 Puckering of macula, bilateral: Secondary | ICD-10-CM | POA: Diagnosis not present

## 2022-06-25 DIAGNOSIS — J301 Allergic rhinitis due to pollen: Secondary | ICD-10-CM | POA: Diagnosis not present

## 2022-06-25 DIAGNOSIS — J3089 Other allergic rhinitis: Secondary | ICD-10-CM | POA: Diagnosis not present

## 2022-06-25 DIAGNOSIS — J3081 Allergic rhinitis due to animal (cat) (dog) hair and dander: Secondary | ICD-10-CM | POA: Diagnosis not present

## 2022-06-29 ENCOUNTER — Encounter: Payer: Self-pay | Admitting: Neurology

## 2022-06-29 ENCOUNTER — Other Ambulatory Visit: Payer: Self-pay | Admitting: Neurology

## 2022-06-29 MED ORDER — ALPRAZOLAM 0.25 MG PO TABS: ORAL_TABLET | ORAL | 0 refills | Status: DC

## 2022-07-02 ENCOUNTER — Telehealth: Payer: Self-pay | Admitting: *Deleted

## 2022-07-02 ENCOUNTER — Encounter: Payer: Self-pay | Admitting: Gastroenterology

## 2022-07-02 ENCOUNTER — Ambulatory Visit (HOSPITAL_COMMUNITY)
Admission: RE | Admit: 2022-07-02 | Discharge: 2022-07-02 | Disposition: A | Discharge status: Home / Self Care | Payer: PPO | Source: Ambulatory Visit | Attending: Neurology | Admitting: Neurology

## 2022-07-02 ENCOUNTER — Encounter: Payer: Self-pay | Admitting: Sports Medicine

## 2022-07-02 DIAGNOSIS — R51 Headache with orthostatic component, not elsewhere classified: Secondary | ICD-10-CM

## 2022-07-02 DIAGNOSIS — H5460 Unqualified visual loss, one eye, unspecified: Secondary | ICD-10-CM | POA: Diagnosis not present

## 2022-07-02 DIAGNOSIS — G8929 Other chronic pain: Secondary | ICD-10-CM | POA: Diagnosis not present

## 2022-07-02 DIAGNOSIS — R519 Headache, unspecified: Secondary | ICD-10-CM

## 2022-07-02 NOTE — Telephone Encounter (Signed)
-----   Message from Melvenia Beam, MD sent at 07/02/2022  1:34 PM EDT ----- Carotid dopplers look fine, no significant stenosis in the carotids look great thanks have a great weekend

## 2022-07-02 NOTE — Telephone Encounter (Signed)
Called [facility]. She said DEA number is fine. Dr Cathren Laine # is in date.

## 2022-07-06 DIAGNOSIS — J301 Allergic rhinitis due to pollen: Secondary | ICD-10-CM | POA: Diagnosis not present

## 2022-07-06 DIAGNOSIS — J3081 Allergic rhinitis due to animal (cat) (dog) hair and dander: Secondary | ICD-10-CM | POA: Diagnosis not present

## 2022-07-06 DIAGNOSIS — J3089 Other allergic rhinitis: Secondary | ICD-10-CM | POA: Diagnosis not present

## 2022-07-06 NOTE — Progress Notes (Signed)
Aleen Sells D.[facility] [address] [place] [address] Phone: [phone]   Assessment and Plan:     1. Chronic right-sided low back pain with right-sided sciatica 2. Pain of right hip 3. Acute pain of right knee 4. Left knee pain, unspecified chronicity -Chronic exacerbation, subsequent visit - Patient has had significant improvement in right-sided knee pain, however she presents today with multiple musculoskeletal complaints including left knee pain, right calf and shin pain, right hip pain radiating down right leg.  Images obtained at office visit today.  My interpretation: No acute fracture or dislocation.  Mild degenerative changes in lateral left knee compartment.  Mild degenerative changes in bilateral hip joints with femoral spurring.  Mild facet arthropathy at L5-S1. - Start Tylenol 500 to 1000 mg tablets 2-3 times a day for day-to-day pain relief  - May discontinue meloxicam at this time and may use remainder as needed.  Recommend only using meloxicam for breakthrough pain, no more than 1-2 times per week.  Refill provided today - Continue HEP for knees and add in HEP for hips - DG Knee AP/LAT W/[place] Left; Future    Pertinent previous records reviewed include none   Follow Up: 4 weeks for reevaluation.  If no improvement or worsening of symptoms, could consider physical therapy versus CSI.  Patient recently completed NSAID course and has completed 2 prednisone courses this calendar year, so I do not recommend repeating courses at this time   Subjective:   I, Jerene Canny, am serving as a Neurosurgeon for Doctor Richardean Sale   Chief Complaint: right leg pain    HPI:    06/08/22 Patient is a 72 year old female complaining of right leg pain. Patient states that she has had pain since Saturday morning, she has pain behind the knee and calf, isnt able to flex or extend all the way due to pain, has been taking naproxen and that  helped some, no MOI, no numbness or tingling, no swelling or warmth , pain does ache through the calf, has a hard time going up and down steps , does have antalgic gait    07/07/2022 Patient states that she is okay, pain is still in the calf , at night she has pain from her leg to her hip , notes osteopenia that hasn't been check in awhile , finished the meloxicam     Relevant Historical Information: Hypertension, Barrett's esophagus, Sjogren's syndrome  Additional pertinent review of systems negative.   Current Outpatient Medications:    albuterol (VENTOLIN HFA) 108 (90 Base) MCG/ACT inhaler, 1-2 puff as needed, Disp: 18 g, Rfl: 3   ALPRAZolam (XANAX) 0.25 MG tablet, Take 1-2 tabs (0.25mg -0.50mg ) 30-60 minutes before procedure. May repeat if needed.Do not drive., Disp: 4 tablet, Rfl: 0   Artificial Tear Solution (TEARS NATURALE OP), Place 1 drop into both eyes See admin instructions. Use 1 drop into both eyes (scheduled) in the morning & may use 3 times daily if needed for dry /irritated eyes., Disp: , Rfl:    atorvastatin (LIPITOR) 40 MG tablet, Take 1 tablet (40 mg total) by mouth daily., Disp: 90 tablet, Rfl: 2   azelastine (OPTIVAR) 0.05 % ophthalmic solution, INSTILL 1 DROP IN BOTH EYES TWICE DAILY AS NEEDED, Disp: 18 mL, Rfl: 0   Azelastine HCl 0.15 % SOLN, USE 2 SPRAYS IN EACH NOSTRIL IN THE MORNING AND AT BEDTIME (Patient taking differently: Place 2 sprays into both nostrils 2 (two) times daily as  needed (allergies.).), Disp: 30 mL, Rfl: 0   benzonatate (TESSALON PERLES) 100 MG capsule, Take 1 capsule (100 mg total) by mouth 3 (three) times daily as needed., Disp: 30 capsule, Rfl: 0   budesonide-formoterol (SYMBICORT) 160-4.5 MCG/ACT inhaler, Inhale 2 puffs into the lungs 2 (two) times daily. (Patient taking differently: Inhale 2 puffs into the lungs 2 (two) times daily as needed (respiratory issues.).), Disp: 1 each, Rfl: 6   clonazePAM (KLONOPIN) 0.25 MG disintegrating tablet, DISSOLVE  ONE TABLET BY MOUTH EVERY DAY AS NEEDED, Disp: 30 tablet, Rfl: 3   EPIPEN 2-PAK 0.3 MG/0.3ML SOAJ injection, Inject 0.3 mg into the muscle as needed for anaphylaxis. , Disp: , Rfl: 1   escitalopram (LEXAPRO) 20 MG tablet, Take 1 tablet (20 mg total) by mouth daily., Disp: 90 tablet, Rfl: 3   estrogen, conjugated,-medroxyprogesterone (PREMPRO) 0.45-1.5 MG per tablet, Take 1 tablet by mouth at bedtime. , Disp: , Rfl:    fluticasone (FLONASE) 50 MCG/ACT nasal spray, Place 2 sprays into both nostrils in the morning and at bedtime., Disp: , Rfl:    ipratropium (ATROVENT) 0.03 % nasal spray, Place 2 sprays into both nostrils every 12 (twelve) hours. (Patient taking differently: Place 2 sprays into both nostrils 2 (two) times daily as needed (allergies.).), Disp: 30 mL, Rfl: 12   lansoprazole (PREVACID) 30 MG capsule, TAKE 1 CAPSULE(30 MG) BY MOUTH TWICE DAILY BEFORE A MEAL, Disp: 60 capsule, Rfl: 6   losartan (COZAAR) 50 MG tablet, Take 1 tablet (50 mg total) by mouth daily., Disp: 90 tablet, Rfl: 3   montelukast (SINGULAIR) 10 MG tablet, Take 1 tablet (10 mg total) by mouth at bedtime., Disp: 90 tablet, Rfl: 3   Multiple Vitamin (MULTIVITAMIN) capsule, Take 1 capsule by mouth daily., Disp: , Rfl:    Probiotic Product (PROBIOTIC COLON SUPPORT PO), Take 1 capsule by mouth at bedtime. , Disp: , Rfl:    Spacer/Aero-Holding Chambers (VALVED HOLDING CHAMBER) DEVI, SMARTSIG:Inhalation Via Inhaler As Directed, Disp: , Rfl:    TRELEGY ELLIPTA 200-62.5-25 MCG/ACT AEPB, Take 1 puff by mouth daily., Disp: , Rfl:    Ubrogepant (UBRELVY) 100 MG TABS, Take 1 tablet (100 mg total) by mouth in the morning and at bedtime. Maximum 200mg  a day., Disp: 4 tablet, Rfl: 0   meloxicam (MOBIC) 15 MG tablet, Take 1 tablet (15 mg total) by mouth daily as needed for pain., Disp: 30 tablet, Rfl: 0   Objective:     Vitals:   07/07/22 1354  BP: 122/82  Pulse: 76  SpO2: 99%  Weight: 140 lb (63.5 kg)  Height: 5\' 3"  (1.6 m)       Body mass index is 24.8 kg/m.    Physical Exam:    Gen: Appears well, nad, nontoxic and pleasant Psych: Alert and oriented, appropriate mood and affect Neuro: sensation intact, strength is 5/5 in upper and lower extremities, muscle tone wnl Skin: no susupicious lesions or rashes  Back - Normal skin, Spine with normal alignment and no deformity.   No tenderness to vertebral process palpation.   Paraspinous muscles are not tender and without spasm NTTP gluteal musculature Straight leg raise negative Trendelenberg negative  Bilateral knee/lower extremity: No swelling No deformity Neg fluid wave, joint milking ROM Flex 110, Ext 0 TTP pes anserine bursa, medial joint line, anterior medial tibia, right greater trochanteric bursa, right IT band NTTP over the quad tendon, medial fem condyle, lat fem condyle, patella, plica, patella tendon, tibial tuberostiy, fibular head, posterior fossa, pes anserine  bursa, gerdy's tubercle, medial jt line, lateral jt line Neg anterior and posterior drawer Neg lachman Neg sag sign Negative varus stress Negative valgus stress Negative McMurray Negative [place]ss[place]  Gait normal    Electronically signed by:  Aleen Sells D.[facility] 2:45 PM 07/07/22

## 2022-07-07 ENCOUNTER — Ambulatory Visit (INDEPENDENT_AMBULATORY_CARE_PROVIDER_SITE_OTHER): Payer: PPO | Admitting: Sports Medicine

## 2022-07-07 ENCOUNTER — Other Ambulatory Visit: Payer: Self-pay

## 2022-07-07 ENCOUNTER — Ambulatory Visit (INDEPENDENT_AMBULATORY_CARE_PROVIDER_SITE_OTHER): Payer: PPO

## 2022-07-07 VITALS — BP 122/82 | HR 76 | Ht 63.0 in | Wt 140.0 lb

## 2022-07-07 DIAGNOSIS — M25561 Pain in right knee: Secondary | ICD-10-CM

## 2022-07-07 DIAGNOSIS — M545 Low back pain, unspecified: Secondary | ICD-10-CM

## 2022-07-07 DIAGNOSIS — M25551 Pain in right hip: Secondary | ICD-10-CM

## 2022-07-07 DIAGNOSIS — G8929 Other chronic pain: Secondary | ICD-10-CM

## 2022-07-07 DIAGNOSIS — J849 Interstitial pulmonary disease, unspecified: Secondary | ICD-10-CM

## 2022-07-07 DIAGNOSIS — M47816 Spondylosis without myelopathy or radiculopathy, lumbar region: Secondary | ICD-10-CM | POA: Diagnosis not present

## 2022-07-07 DIAGNOSIS — M25562 Pain in left knee: Secondary | ICD-10-CM

## 2022-07-07 DIAGNOSIS — M5136 Other intervertebral disc degeneration, lumbar region: Secondary | ICD-10-CM | POA: Diagnosis not present

## 2022-07-07 DIAGNOSIS — M5441 Lumbago with sciatica, right side: Secondary | ICD-10-CM | POA: Diagnosis not present

## 2022-07-07 DIAGNOSIS — M1611 Unilateral primary osteoarthritis, right hip: Secondary | ICD-10-CM | POA: Diagnosis not present

## 2022-07-07 MED ORDER — MELOXICAM 15 MG PO TABS: 15.0000 mg | ORAL_TABLET | Freq: Every day | ORAL | 0 refills | Status: AC | PRN

## 2022-07-07 NOTE — Patient Instructions (Addendum)
Good to see you  Tylenol [phone] mg 2-3 times a day for pain relief  May use meloxicam for breakthrough pain no more than 1-2 times a week  meloxicam refill Continue knee and Hip HEP 4 week follow up

## 2022-07-07 NOTE — Progress Notes (Signed)
pft  

## 2022-07-08 ENCOUNTER — Ambulatory Visit (INDEPENDENT_AMBULATORY_CARE_PROVIDER_SITE_OTHER): Payer: PPO | Admitting: Pulmonary Disease

## 2022-07-08 ENCOUNTER — Encounter: Payer: Self-pay | Admitting: Pulmonary Disease

## 2022-07-08 VITALS — BP 128/82 | HR 80 | Ht 63.0 in | Wt 142.0 lb

## 2022-07-08 DIAGNOSIS — J849 Interstitial pulmonary disease, unspecified: Secondary | ICD-10-CM

## 2022-07-08 DIAGNOSIS — M3502 Sicca syndrome with lung involvement: Secondary | ICD-10-CM | POA: Diagnosis not present

## 2022-07-08 LAB — PULMONARY FUNCTION TEST
DL/VA % pred: 107 %
DL/VA: 4.46 ml/min/mmHg/L
DLCO cor % pred: 110 %
DLCO cor: 20.67 ml/min/mmHg
DLCO unc % pred: 113 %
DLCO unc: 21.16 ml/min/mmHg
FEF 25-75 Post: 1.83 L/sec
FEF 25-75 Pre: 2.03 L/sec
FEF2575-%Change-Post: -9 %
FEF2575-%Pred-Post: 104 %
FEF2575-%Pred-Pre: 116 %
FEV1-%Change-Post: 2 %
FEV1-%Pred-Post: 98 %
FEV1-%Pred-Pre: 96 %
FEV1-Post: 2.07 L
FEV1-Pre: 2.03 L
FEV1FVC-%Change-Post: 0 %
FEV1FVC-%Pred-Pre: 105 %
FEV6-%Change-Post: 2 %
FEV6-%Pred-Post: 97 %
FEV6-%Pred-Pre: 94 %
FEV6-Post: 2.6 L
FEV6-Pre: 2.54 L
FEV6FVC-%Change-Post: 0 %
FEV6FVC-%Pred-Post: 104 %
FEV6FVC-%Pred-Pre: 104 %
FVC-%Change-Post: 1 %
FVC-%Pred-Post: 92 %
FVC-%Pred-Pre: 91 %
FVC-Post: 2.6 L
FVC-Pre: 2.55 L
Post FEV1/FVC ratio: 80 %
Post FEV6/FVC ratio: 100 %
Pre FEV1/FVC ratio: 80 %
Pre FEV6/FVC Ratio: 99 %
RV % pred: 96 %
RV: 2.11 L
TLC % pred: 102 %
TLC: 5.02 L

## 2022-07-08 NOTE — Progress Notes (Signed)
Synopsis: Referred in January 2022 by Hillard Danker, MD for Chronic Cough  Subjective:   PATIENT ID: Sheri Andrews GENDER: female DOB: 1950/08/16, MRN: [id number]  HPI  Chief Complaint  Patient presents with   Follow-up    F/U after PFT. States her breathing has been doing well since last visit.    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   PFTs are within normal limits today. She has been feeling well since last visit. She has been working on getting her house ready to put on the market and considering a move to [place] near her children.   OV 03/25/22 She has been doing well since last visit. No progressive changes in her exertional dyspnea. She recently saw Dr. Glenna Fellows of nephrology and is on losartan now for her blood pressure. Her Cr has normalized.   PFTs today show stable FEV1 and FVC and improved TLC. Her DLCO has decreased but she reports having trouble completing the breathing test at the end. Her follow up HRCT Chest shows improvement of the bilateral lower lobe interstitial findings compared to last CT scan 04/29/21.  OV 12/24/21 She is feeling much better since stopping Ofev. She feels tired and reports she is not doing as much as she used to. She saw Dr. Glenna Fellows of nephrology who is monitoring the proteinuria and is not concerned for underlying kidney disease at this time. She has gained 2lbs since last visit.  OV 11/20/21 She reports 14lbs weight loss, lack of appetite and diarrhea since being on the Ofev. She denies any issues with her breathing at this time. She has follow up with Dr. Dimple Casey later this month and with nephrology in September.   OV 09/18/21 She met with Dr. Isaiah Serge on 07/09/21 for second opinion. He discussed with her that she has connective tissue disease- associated interstitial lung  disease in the setting of Sjogren's Syndrome based on her recent diagnosis from rheumatology. She was started on hydroxychloroquine for the Sjogren's. It was recommended that we consider adding cellcept to her treatment regimen by Dr. Isaiah Serge.    She was started on Ofev in 07/2021. We received message on 5/16 that she is noticing foamy urine so a UA was performed by her PCP which showed elevated protein in the urine, presence of calcium oxalate crystals and elevated microalbumin. We have ordered her for a 24 hour urine collection to quantify the protein in her urine. This is noted for be a very rare side effect from Ofev as discussed with representatives from the pharmaceutical company and my colleagues.   She has tolerated Ofev well without much side effects. She has lost 5lbs and has been trying to lose weight by going to the gym.   Timeline for recent medications and proteinuria: 4/17 - Started Ofev  5/4 - Started hydroxychloroquine 5/7-9 Noted foamy urine 5/11 - UA performed indicating proteinuria 5/24 - Stopped hydroxychloroquine   OV 06/04/21 HRCT Chest was ordered after last visit due to abnormal flow volume loop concerning for either fixed airway obstruction versus variable extrathoracic airway obstruction. CT scan showed some patchy areas of ground glass attenuation, septal thickening and parenchymal banding in the lungs bilaterally, most evident in the mid to lower lungs. Very mild cylindrical traction bronchiectasis and peripheral bronchiolectasis. No frank honeycombing. Inspiratory and expiratory imaging indicates mild air trapping. Final read was  concerning for probable usual interstitial pneumonia.   These results were discussed with the patient at her visit with Rubye Oaks, NP on 05/12/21 and an inflammatory panel was checked with a positive ANA titer of 1:40 with elevated SSA ab at 6.4. She was referred to rheumatology and has an appointment on 06/27/21 with Dr. Dimple Casey.   She denies  any diffuse joint aches or skin changes. She does have some dry eyes. No family history of pulmonary fibrosis or autoimmune conditions.  OV 04/08/21 She was trialed on Breztri inhaler after last visit and she does report some improvement in her breathing.  She was seen in video visit on 04/02/2021 for increasing chest tightness, nonproductive cough and some wheezing.  She was provided with steroid taper and noted improvement in her cough and wheezing but she continues to experience chest tightness.  Pulmonary function test today shows moderate obstructive defect with flow volume loop concerning for possible fixed airway obstruction versus possible variable extrathoracic airway obstruction.  OV 02/24/21 She has been using symbicort 160-4.46mcg 2 puffs twice daily but has noticed increase shortness of breath since having covid 19 in June. She was treated outpatient with an antiviral medication. She denies night time awakenings. She is mainly experiencing exertional dyspnea and does notice wheezing when she is walking for exercise.   She continues on medications for her GERD and sinus congestion/drainage with out issues.  OV 05/29/20 She presented to the [facility] ER on 04/16/20 with sinus pressure, headaches, cough and sore throat. She tested negative for Covid 19. Chest radiograph was unremarkable as well as BMP and CBC. She was provided with azelastine nasal spray and discharged home. She complains of cough since her ER visit that is dry. She has congestion with post-nasal drip. She has some sinus congestion and ear discomfort. She has shortness of breath with exertion and with coughing episodes. She has history of hiatal hernia and is on pantoprazole and famotidine for GERD. She reports a substernal chest discomfort that is not worse with exertion or associated with nausea or diaphoresis.   She is taking symbicort 160-4.12mcg 2 puffs twice daily and does not complain of frequent wheezing. She is on montelukast  for asthma and allergies. She is on flonase daily.   She has seen Dr. Annalee Genta in ENT perviously for pansinusitis, last seen in 06/2018.          Past Medical History:  Diagnosis Date   Acid reflux disease    Allergic rhinitis    Anxiety    Asthma    Barrett esophagus    Dyspnea    Dysthymia    Hypercholesterolemia    Hypertension    Irritable bowel syndrome    Nephrolithiasis    Osteopenia    Osteoporosis    Other chest pain    Pulmonary fibrosis (HCC)    Sjogren's disease (HCC)    Status post dilation of esophageal narrowing      Family History  Problem Relation Age of Onset   Heart disease Mother    Diabetes Mother    Heart disease Father    Diabetes Sister    Heart disease Sister    Diabetes Brother    Heart disease Brother    Heart disease Brother    Cancer Maternal Uncle        type unknown   Throat cancer Paternal Uncle    Colon cancer Neg Hx    Stomach cancer Neg Hx    Rectal cancer Neg Hx  Pancreatic cancer Neg Hx    Migraines Neg Hx      Social History   Socioeconomic History   Marital status: Married    Spouse name: Not on file   Number of children: 2   Years of education: Not on file   Highest education level: Not on file  Occupational History   Occupation: retired  Tobacco Use   Smoking status: Never    Passive exposure: Never   Smokeless tobacco: Never  Vaping Use   Vaping Use: Never used  Substance and Sexual Activity   Alcohol use: Yes    Alcohol/week: 1.0 standard drink of alcohol    Types: 1 Standard drinks or equivalent per week    Comment: rare twice month   Drug use: No   Sexual activity: Yes    Partners: Male  Other Topics Concern   Not on file  Social History Narrative   Caffiene 2 cups daily.    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PR   Social Determinants of Corporate investment banker Strain: Low Risk  (02/12/2022)   Overall Financial Resource Strain (CARDIA)    Difficulty of Paying Living Expenses: Not hard at all  Food  Insecurity: No Food Insecurity (02/12/2022)   Hunger Vital Sign    Worried About Running Out of Food in the Last Year: Never true    Ran Out of Food in the Last Year: Never true  Transportation Needs: No Transportation Needs (02/12/2022)   PRAPARE - Administrator, Civil Service (Medical): No    Lack of Transportation (Non-Medical): No  Physical Activity: Sufficiently Active (02/12/2022)   Exercise Vital Sign    Days of Exercise per Week: 7 days    Minutes of Exercise per Session: 30 min  Stress: No Stress Concern Present (02/12/2022)   [employer] of Occupational Health - Occupational Stress Questionnaire    Feeling of Stress : Not at all  Social Connections: Socially Integrated (02/12/2022)   Social Connection and Isolation Panel [NHANES]    Frequency of Communication with Friends and Family: More than three times a week    Frequency of Social Gatherings with Friends and Family: More than three times a week    Attends Religious Services: More than 4 times per year    Active Member of [employer] or Organizations: Yes    Attends Engineer, structural: More than 4 times per year    Marital Status: Married  Catering manager Violence: Not At Risk (02/12/2022)   Humiliation, Afraid, Rape, and Kick questionnaire    Fear of Current or Ex-Partner: No    Emotionally Abused: No    Physically Abused: No    Sexually Abused: No     Allergies  Allergen Reactions   Amoxicillin Other (See Comments)    Upset stomach   Wellbutrin [Bupropion] Swelling   Doxycycline Hyclate Other (See Comments)    upset  stomach   Erythromycin Ethylsuccinate Other (See Comments)     upset stomach     Outpatient Medications Prior to Visit  Medication Sig Dispense Refill   albuterol (VENTOLIN HFA) 108 (90 Base) MCG/ACT inhaler 1-2 puff as needed 18 g 3   ALPRAZolam (XANAX) 0.25 MG tablet Take 1-2 tabs (0.25mg -0.50mg ) 30-60 minutes before procedure. May repeat if needed.Do not drive. 4  tablet 0   Artificial Tear Solution (TEARS NATURALE OP) Place 1 drop into both eyes See admin instructions. Use 1 drop into both eyes (scheduled) in the morning & may use 3  times daily if needed for dry /irritated eyes.     atorvastatin (LIPITOR) 40 MG tablet Take 1 tablet (40 mg total) by mouth daily. 90 tablet 2   azelastine (OPTIVAR) 0.05 % ophthalmic solution INSTILL 1 DROP IN BOTH EYES TWICE DAILY AS NEEDED 18 mL 0   Azelastine HCl 0.15 % SOLN USE 2 SPRAYS IN EACH NOSTRIL IN THE MORNING AND AT BEDTIME (Patient taking differently: Place 2 sprays into both nostrils 2 (two) times daily as needed (allergies.).) 30 mL 0   benzonatate (TESSALON PERLES) 100 MG capsule Take 1 capsule (100 mg total) by mouth 3 (three) times daily as needed. 30 capsule 0   clonazePAM (KLONOPIN) 0.25 MG disintegrating tablet DISSOLVE ONE TABLET BY MOUTH EVERY DAY AS NEEDED 30 tablet 3   EPIPEN 2-PAK 0.3 MG/0.3ML SOAJ injection Inject 0.3 mg into the muscle as needed for anaphylaxis.   1   escitalopram (LEXAPRO) 20 MG tablet Take 1 tablet (20 mg total) by mouth daily. 90 tablet 3   estrogen, conjugated,-medroxyprogesterone (PREMPRO) 0.45-1.5 MG per tablet Take 1 tablet by mouth at bedtime.      fluticasone (FLONASE) 50 MCG/ACT nasal spray Place 2 sprays into both nostrils in the morning and at bedtime.     ipratropium (ATROVENT) 0.03 % nasal spray Place 2 sprays into both nostrils every 12 (twelve) hours. (Patient taking differently: Place 2 sprays into both nostrils 2 (two) times daily as needed (allergies.).) 30 mL 12   lansoprazole (PREVACID) 30 MG capsule TAKE 1 CAPSULE(30 MG) BY MOUTH TWICE DAILY BEFORE A MEAL 60 capsule 6   losartan (COZAAR) 50 MG tablet Take 1 tablet (50 mg total) by mouth daily. 90 tablet 3   meloxicam (MOBIC) 15 MG tablet Take 1 tablet (15 mg total) by mouth daily as needed for pain. 30 tablet 0   montelukast (SINGULAIR) 10 MG tablet Take 1 tablet (10 mg total) by mouth at bedtime. 90 tablet 3    Multiple Vitamin (MULTIVITAMIN) capsule Take 1 capsule by mouth daily.     Probiotic Product (PROBIOTIC COLON SUPPORT PO) Take 1 capsule by mouth at bedtime.      Spacer/Aero-Holding Chambers (VALVED HOLDING CHAMBER) DEVI SMARTSIG:Inhalation Via Inhaler As Directed     TRELEGY ELLIPTA 200-62.5-25 MCG/ACT AEPB Take 1 puff by mouth daily.     Ubrogepant (UBRELVY) 100 MG TABS Take 1 tablet (100 mg total) by mouth in the morning and at bedtime. Maximum 200mg  a day. 4 tablet 0   budesonide-formoterol (SYMBICORT) 160-4.5 MCG/ACT inhaler Inhale 2 puffs into the lungs 2 (two) times daily. (Patient taking differently: Inhale 2 puffs into the lungs 2 (two) times daily as needed (respiratory issues.).) 1 each 6   No facility-administered medications prior to visit.   Review of Systems  Constitutional:  Negative for chills, fever, malaise/fatigue and weight loss.  HENT:  Negative for congestion, sinus pain and sore throat.   Eyes: Negative.   Respiratory:  Positive for shortness of breath (exertional). Negative for cough, hemoptysis, sputum production and wheezing.   Cardiovascular:  Negative for chest pain, palpitations, orthopnea, claudication and leg swelling.  Gastrointestinal:  Negative for abdominal pain, diarrhea, heartburn, nausea and vomiting.  Genitourinary: Negative.   Musculoskeletal:  Negative for joint pain and myalgias.  Skin:  Negative for rash.  Neurological:  Negative for weakness.  Endo/Heme/Allergies: Negative.   Psychiatric/Behavioral: Negative.     Objective:   Vitals:   07/08/22 1501  BP: 128/82  Pulse: 80  SpO2: 99%  Weight:  142 lb (64.4 kg)  Height: 5\' 3"  (1.6 m)   Physical Exam Constitutional:      General: She is not in acute distress.    Appearance: Normal appearance. She is not ill-appearing.  HENT:     Head: Normocephalic and atraumatic.  Cardiovascular:     Rate and Rhythm: Normal rate and regular rhythm.     Pulses: Normal pulses.     Heart sounds: Normal  heart sounds. No murmur heard. Pulmonary:     Effort: Pulmonary effort is normal.     Breath sounds: No wheezing, rhonchi or rales.  Musculoskeletal:     Right lower leg: No edema.     Left lower leg: No edema.  Skin:    General: Skin is warm and dry.  Neurological:     General: No focal deficit present.     Mental Status: She is alert.    CBC    Component Value Date/Time   WBC 6.1 06/17/2022 1214   WBC 7.7 08/29/2021 1315   RBC 4.57 06/17/2022 1214   RBC 4.36 08/29/2021 1315   HGB 14.2 06/17/2022 1214   HCT 42.0 06/17/2022 1214   PLT 266 06/17/2022 1214   MCV 92 06/17/2022 1214   MCH 31.1 06/17/2022 1214   MCH 30.3 08/29/2021 1315   MCHC 33.8 06/17/2022 1214   MCHC 34.7 08/29/2021 1315   RDW 13.3 06/17/2022 1214   LYMPHSABS 2.3 06/17/2022 1214   MONOABS 0.5 06/19/2021 1239   EOSABS 0.1 06/17/2022 1214   BASOSABS 0.0 06/17/2022 1214      Latest Ref Rng & Units 06/17/2022   12:14 PM 11/19/2021    1:04 PM 09/19/2021   11:01 AM  BMP  Glucose 70 - 99 mg/dL 98  94  95   BUN 8 - 27 mg/dL 16  14  13    Creatinine 0.57 - 1.00 mg/dL 6.96  2.95  2.84   BUN/Creat Ratio 12 - 28 16     Sodium 134 - 144 mmol/L 142  140  139   Potassium 3.5 - 5.2 mmol/L 4.4  3.8  3.6   Chloride 96 - 106 mmol/L 104  103  106   CO2 20 - 29 mmol/L 25  28  26    Calcium 8.7 - 10.3 mg/dL 9.9  9.3  9.3    Chest imaging: HRCT Chest 03/23/22 Very mild patchy scattered subpleural reticulation and ground-glass opacity in both lungs, substantially decreased since 04/29/2021 chest CT. No significant bronchiectasis or honeycombing. Favor residual mild bland nonspecific postinfectious/postinflammatory scarring. Interstitial lung disease less favored given the significant improvement. 2. One vessel coronary atherosclerosis. 3. Small hiatal hernia. 4.  Aortic Atherosclerosis  HRCT Chest 04/29/21 1. The appearance of the lungs is compatible with interstitial lung disease, with a spectrum of findings  considered probable usual interstitial pneumonia (UIP) per current ATS guidelines. These findings are clearly progressive compared to prior cardiac CT 01/25/2020. Repeat high-resolution chest CT is suggested in 12 months to assess for temporal changes in the appearance of the lung parenchyma. 2. Aortic atherosclerosis, in addition to left main and 2 vessel coronary artery disease.   CXR 02/24/21 Increased interstitial markings bilaterally  CXR 04/16/20 The heart size and mediastinal contours are within normal limits. Both lungs are clear. No pleural effusion. The visualized skeletal structures are unremarkable.  PFT:    Latest Ref Rng & Units 07/07/2022    9:03 AM 03/25/2022   12:03 PM 04/08/2021   11:06 AM  PFT Results  FVC-Pre L 2.55  P 2.59  2.74   FVC-Predicted Pre % 91  P 91  96   FVC-Post L 2.60  P 2.64  2.77   FVC-Predicted Post % 92  P 93  97   Pre FEV1/FVC % % 80  P 77  66   Post FEV1/FCV % % 80  P 52  50   FEV1-Pre L 2.03  P 1.99  1.82   FEV1-Predicted Pre % 96  P 92  85   FEV1-Post L 2.07  P 1.38  1.37   DLCO uncorrected ml/min/mmHg 21.16  P 13.03  22.11   DLCO UNC% % 113  P 69  117   DLCO corrected ml/min/mmHg 20.67  P 13.03  22.11   DLCO COR %Predicted % 110  P 69  117   DLVA Predicted % 107  P 119  106   TLC L 5.02  P 5.32  5.14   TLC % Predicted % 102  P 108  104   RV % Predicted % 96  P 78  92     P Preliminary result    Labs:  Path:  Echo:  Heart Catheterization:  Assessment & Plan:   No diagnosis found.  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   She was started on Ofev 4/17 and stopped 8/3 due to intolerance with weight loss and GI symptoms. There was also concern for proteinuria but this is felt mostly due to hydroxychloroquine. She is following with  nephrology for proteinuria. Her renal function and proteinuria have improved since stopping the above medications.  Repeat PFTs today have returned to the normal range. Her HRCT Chest scan from December shows improvement in the bilateraly patchy interstitial changes. She is overall feeling well without progression of her exertional dyspnea.   We will continue to monitor with PFTs in 9 months along with HRCT Chest.  She can continue to use symbicort inhaler.   Follow up in 9 months.  Melody Comas, MD [facility] Office: [phone]      Current Outpatient Medications:    albuterol (VENTOLIN HFA) 108 (90 Base) MCG/ACT inhaler, 1-2 puff as needed, Disp: 18 g, Rfl: 3   ALPRAZolam (XANAX) 0.25 MG tablet, Take 1-2 tabs (0.25mg -0.50mg ) 30-60 minutes before procedure. May repeat if needed.Do not drive., Disp: 4 tablet, Rfl: 0   Artificial Tear Solution (TEARS NATURALE OP), Place 1 drop into both eyes See admin instructions. Use 1 drop into both eyes (scheduled) in the morning & may use 3 times daily if needed for dry /irritated eyes., Disp: , Rfl:    atorvastatin (LIPITOR) 40 MG tablet, Take 1 tablet (40 mg total) by mouth daily., Disp: 90 tablet, Rfl: 2   azelastine (OPTIVAR) 0.05 % ophthalmic solution, INSTILL 1 DROP IN BOTH EYES TWICE DAILY AS NEEDED, Disp: 18 mL, Rfl: 0   Azelastine HCl 0.15 % SOLN, USE 2 SPRAYS IN EACH NOSTRIL IN THE MORNING AND AT BEDTIME (Patient taking differently: Place 2 sprays into both nostrils 2 (two) times daily as needed (allergies.).), Disp: 30 mL, Rfl: 0   benzonatate (TESSALON PERLES) 100 MG capsule, Take 1 capsule (100 mg total) by mouth 3 (three) times daily as needed., Disp: 30 capsule, Rfl: 0   clonazePAM (KLONOPIN) 0.25 MG disintegrating tablet, DISSOLVE ONE TABLET BY MOUTH  EVERY DAY AS NEEDED, Disp: 30 tablet, Rfl: 3   EPIPEN 2-PAK 0.3 MG/0.3ML SOAJ injection, Inject 0.3 mg into the muscle as needed for anaphylaxis. , Disp: , Rfl:  1   escitalopram (LEXAPRO) 20 MG tablet, Take 1 tablet (20 mg total) by mouth daily., Disp: 90 tablet, Rfl: 3   estrogen, conjugated,-medroxyprogesterone (PREMPRO) 0.45-1.5 MG per tablet, Take 1 tablet by mouth at bedtime. , Disp: , Rfl:    fluticasone (FLONASE) 50 MCG/ACT nasal spray, Place 2 sprays into both nostrils in the morning and at bedtime., Disp: , Rfl:    ipratropium (ATROVENT) 0.03 % nasal spray, Place 2 sprays into both nostrils every 12 (twelve) hours. (Patient taking differently: Place 2 sprays into both nostrils 2 (two) times daily as needed (allergies.).), Disp: 30 mL, Rfl: 12   lansoprazole (PREVACID) 30 MG capsule, TAKE 1 CAPSULE(30 MG) BY MOUTH TWICE DAILY BEFORE A MEAL, Disp: 60 capsule, Rfl: 6   losartan (COZAAR) 50 MG tablet, Take 1 tablet (50 mg total) by mouth daily., Disp: 90 tablet, Rfl: 3   meloxicam (MOBIC) 15 MG tablet, Take 1 tablet (15 mg total) by mouth daily as needed for pain., Disp: 30 tablet, Rfl: 0   montelukast (SINGULAIR) 10 MG tablet, Take 1 tablet (10 mg total) by mouth at bedtime., Disp: 90 tablet, Rfl: 3   Multiple Vitamin (MULTIVITAMIN) capsule, Take 1 capsule by mouth daily., Disp: , Rfl:    Probiotic Product (PROBIOTIC COLON SUPPORT PO), Take 1 capsule by mouth at bedtime. , Disp: , Rfl:    Spacer/Aero-Holding Chambers (VALVED HOLDING CHAMBER) DEVI, SMARTSIG:Inhalation Via Inhaler As Directed, Disp: , Rfl:    TRELEGY ELLIPTA 200-62.5-25 MCG/ACT AEPB, Take 1 puff by mouth daily., Disp: , Rfl:    Ubrogepant (UBRELVY) 100 MG TABS, Take 1 tablet (100 mg total) by mouth in the morning and at bedtime. Maximum 200mg  a day., Disp: 4 tablet, Rfl: 0

## 2022-07-08 NOTE — Patient Instructions (Signed)
Full PFT performed today. °

## 2022-07-08 NOTE — Patient Instructions (Addendum)
Your breathing tests are within normal limits today  We will repeat breathing tests and HRCT Chest in December this year  Follow up in 9 months, call sooner if needed

## 2022-07-08 NOTE — Progress Notes (Signed)
Full PFT performed today. °

## 2022-07-09 ENCOUNTER — Ambulatory Visit
Admission: RE | Admit: 2022-07-09 | Discharge: 2022-07-09 | Disposition: A | Discharge status: Home / Self Care | Payer: PPO | Source: Ambulatory Visit | Attending: Neurology | Admitting: Neurology

## 2022-07-09 ENCOUNTER — Encounter: Payer: Self-pay | Admitting: Pulmonary Disease

## 2022-07-09 DIAGNOSIS — R519 Headache, unspecified: Secondary | ICD-10-CM | POA: Diagnosis not present

## 2022-07-09 DIAGNOSIS — H5460 Unqualified visual loss, one eye, unspecified: Secondary | ICD-10-CM

## 2022-07-09 DIAGNOSIS — R51 Headache with orthostatic component, not elsewhere classified: Secondary | ICD-10-CM

## 2022-07-09 DIAGNOSIS — G43901 Migraine, unspecified, not intractable, with status migrainosus: Secondary | ICD-10-CM

## 2022-07-09 DIAGNOSIS — G43401 Hemiplegic migraine, not intractable, with status migrainosus: Secondary | ICD-10-CM | POA: Diagnosis not present

## 2022-07-09 DIAGNOSIS — G8929 Other chronic pain: Secondary | ICD-10-CM | POA: Diagnosis not present

## 2022-07-09 MED ORDER — GADOPICLENOL 0.5 MMOL/ML IV SOLN
6.0000 mL | Freq: Once | INTRAVENOUS | Status: AC | PRN
  Administered 2022-07-09: 6 mL via INTRAVENOUS

## 2022-07-13 NOTE — Progress Notes (Signed)
CARDIOLOGY CONSULT NOTE       Patient ID: Sheri Andrews MRN: [id number] DOB/AGE: [birth year] 72 y.o.  Admit date: (Not on file) Referring Physician: [name] Primary Physician: [name], MD Primary Cardiologist: [name]   HPI:  72 y.o. referred by Dr [name] 01/26/20 for chest pain and family history of premature CAD 3/4 siblings have had MI She is being Rx with statin for HLD LDL 109 She had a normal Myovue in 2010 and normal ETT in March 2019 achieving 7 Mets and 102% PMHR She has had significant reflux and barrett's with esophageal stretching Rx with calcium blocker for HTN and carries a diagnosis of asthma with no smoking history   Coronary calcium score 8 in 2015 isolated to mid/distal LAD Coronary calcium score 72 01/25/20 70 th percentile  Myouve 05/29/21 normal no ischemia EF 62%   Likely had COVID in [place] and was sick for weeks travels a lot And was in [place] and [place]   ILD/Asthma  - sees Rexene Edison feels better on Symbicort rather than Breztri 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   Retired Pharmacist, 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 Considering move to [place] to be near her kids Normal Cr and started on Losartan for BP 02/12/22   No complaints happy her lungs are stable She does not want to try Cellcept until PFTls or CT look worse  ROS All other systems reviewed and negative except as noted above  Past Medical History:  Diagnosis Date   Acid reflux disease    Allergic rhinitis    Anxiety    Asthma    Barrett esophagus    Dyspnea    Dysthymia    Hypercholesterolemia    Hypertension    Irritable bowel syndrome    Nephrolithiasis    Osteopenia    Osteoporosis    Other chest pain    Pulmonary fibrosis ([place])    Sjogren's disease ([place])    Status post dilation of esophageal narrowing     Family History  Problem Relation Age of Onset   Heart disease Mother    Diabetes Mother    Heart disease Father    Diabetes Sister    Heart  disease Sister    Diabetes Brother    Heart disease Brother    Heart disease Brother    Cancer Maternal Uncle        type unknown   Throat cancer Paternal Uncle    Colon cancer Neg Hx    Stomach cancer Neg Hx    Rectal cancer Neg Hx    Pancreatic cancer Neg Hx    Migraines Neg Hx     Social History   Socioeconomic History   Marital status: Married    Spouse name: Not on file   Number of children: 2   Years of education: Not on file   Highest education level: Not on file  Occupational History   Occupation: retired  Tobacco Use   Smoking status: Never    Passive exposure: Never   Smokeless tobacco: Never  Vaping Use   Vaping Use: Never used  Substance and Sexual Activity   Alcohol use: Yes    Alcohol/week: 1.0 standard drink of alcohol    Types: 1 Standard drinks or equivalent per week    Comment: rare twice month   Drug use: No   Sexual activity: Yes    Partners: Male  Other Topics Concern   Not on file  Social History Narrative  Caffiene 2 cups daily.    Working retired Recruitment consultant PR   Social Determinants of Radio broadcast assistant Strain: [place]  (02/12/2022)   Overall Financial Resource Strain (CARDIA)    Difficulty of Paying Living Expenses: Not hard at all  Food Insecurity: No Food Insecurity (02/12/2022)   Hunger Vital Sign    Worried About Running Out of Food in the Last Year: Never true    [place] in the Last Year: Never true  Transportation Needs: No Transportation Needs (02/12/2022)   PRAPARE - Hydrologist (Medical): No    Lack of Transportation (Non-Medical): No  Physical Activity: Sufficiently Active (02/12/2022)   Exercise Vital Sign    Days of Exercise per Week: 7 days    Minutes of Exercise per Session: 30 min  Stress: No Stress Concern Present (02/12/2022)   [place]    Feeling of Stress : Not at all  Social Connections: [place] (02/12/2022)   Social Connection and Isolation Panel [NHANES]    Frequency of Communication with Friends and Family: More than three times a week    Frequency of Social Gatherings with Friends and Family: More than three times a week    Attends Religious Services: More than 4 times per year    Active Member of [employer] or Organizations: Yes    Attends Archivist Meetings: More than 4 times per year    Marital Status: Married  Human resources officer Violence: Not At Risk (02/12/2022)   Humiliation, Afraid, Rape, and Kick questionnaire    Fear of Current or Ex-Partner: No    Emotionally Abused: No    Physically Abused: No    Sexually Abused: No    Past Surgical History:  Procedure Laterality Date   ESOPHAGOGASTRODUODENOSCOPY (EGD) WITH PROPOFOL N/A 05/02/2019   Procedure: ESOPHAGOGASTRODUODENOSCOPY (EGD) WITH PROPOFOL;  Surgeon: Mauri Pole, MD;  Location: [facility];  Service: Endoscopy;  Laterality: N/A;  needing APC available   ESOPHAGOGASTRODUODENOSCOPY (EGD) WITH PROPOFOL N/A 08/11/2021   Procedure: ESOPHAGOGASTRODUODENOSCOPY (EGD) WITH PROPOFOL;  Surgeon: Mauri Pole, MD;  Location: [facility];  Service: Gastroenterology;  Laterality: N/A;   FACIAL COSMETIC SURGERY     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   TUBAL LIGATION  1986      Current Outpatient Medications:    albuterol (VENTOLIN HFA) 108 (90 Base) MCG/ACT inhaler, 1-2 puff as needed, Disp: 18 g, Rfl: 3   ALPRAZolam (XANAX) 0.25 MG tablet, Take 1-2 tabs (0.25mg -0.50mg ) 30-60 minutes before procedure. May repeat if needed.Do not drive., Disp: 4 tablet, Rfl: 0   Artificial Tear Solution (TEARS NATURALE OP), Place 1 drop into both eyes See admin instructions. Use 1 drop into both eyes (scheduled) in the morning & may use 3 times daily if needed for dry /irritated eyes., Disp: , Rfl:    atorvastatin (LIPITOR) 40 MG tablet, Take 1 tablet (40 mg total) by mouth daily., Disp: 90  tablet, Rfl: 2   azelastine (OPTIVAR) 0.05 % ophthalmic solution, INSTILL 1 DROP IN BOTH EYES TWICE DAILY AS NEEDED, Disp: 18 mL, Rfl: 0   Azelastine HCl 0.15 % SOLN, USE 2 SPRAYS IN EACH NOSTRIL IN THE MORNING AND AT BEDTIME (Patient taking differently: Place 2 sprays into both nostrils 2 (two) times daily as needed (allergies.).), Disp: 30 mL, Rfl: 0   benzonatate (TESSALON PERLES) 100 MG capsule, Take 1 capsule (100  mg total) by mouth 3 (three) times daily as needed., Disp: 30 capsule, Rfl: 0   clonazePAM (KLONOPIN) 0.25 MG disintegrating tablet, DISSOLVE ONE TABLET BY MOUTH EVERY DAY AS NEEDED, Disp: 30 tablet, Rfl: 3   EPIPEN 2-PAK 0.3 MG/0.3ML SOAJ injection, Inject 0.3 mg into the muscle as needed for anaphylaxis. , Disp: , Rfl: 1   escitalopram (LEXAPRO) 20 MG tablet, Take 1 tablet (20 mg total) by mouth daily., Disp: 90 tablet, Rfl: 3   estrogen, conjugated,-medroxyprogesterone (PREMPRO) 0.45-1.5 MG per tablet, Take 1 tablet by mouth at bedtime. , Disp: , Rfl:    fluticasone (FLONASE) 50 MCG/ACT nasal spray, Place 2 sprays into both nostrils in the morning and at bedtime., Disp: , Rfl:    ipratropium (ATROVENT) 0.03 % nasal spray, Place 2 sprays into both nostrils every 12 (twelve) hours. (Patient taking differently: Place 2 sprays into both nostrils 2 (two) times daily as needed (allergies.).), Disp: 30 mL, Rfl: 12   lansoprazole (PREVACID) 30 MG capsule, TAKE 1 CAPSULE(30 MG) BY MOUTH TWICE DAILY BEFORE A MEAL, Disp: 60 capsule, Rfl: 6   losartan (COZAAR) 50 MG tablet, Take 1 tablet (50 mg total) by mouth daily., Disp: 90 tablet, Rfl: 3   meloxicam (MOBIC) 15 MG tablet, Take 1 tablet (15 mg total) by mouth daily as needed for pain., Disp: 30 tablet, Rfl: 0   montelukast (SINGULAIR) 10 MG tablet, Take 1 tablet (10 mg total) by mouth at bedtime., Disp: 90 tablet, Rfl: 3   Multiple Vitamin (MULTIVITAMIN) capsule, Take 1 capsule by mouth daily., Disp: , Rfl:    Probiotic Product (PROBIOTIC  COLON SUPPORT PO), Take 1 capsule by mouth at bedtime. , Disp: , Rfl:    Spacer/Aero-Holding Chambers (VALVED HOLDING CHAMBER) DEVI, SMARTSIG:Inhalation Via Inhaler As Directed, Disp: , Rfl:    TRELEGY ELLIPTA 200-62.5-25 MCG/ACT AEPB, Take 1 puff by mouth daily., Disp: , Rfl:    Ubrogepant (UBRELVY) 100 MG TABS, Take 1 tablet (100 mg total) by mouth in the morning and at bedtime. Maximum 200mg  a day., Disp: 4 tablet, Rfl: 0    Physical Exam: There were no vitals taken for this visit.   Affect appropriate Healthy:  appears stated age 18: normal Neck supple with no adenopathy JVP normal no bruits no thyromegaly Lungs clear with no wheezing and good diaphragmatic motion Heart:  S1/S2 no murmur, no rub, gallop or click PMI normal Abdomen: benighn, BS positve, no tenderness, no AAA no bruit.  No HSM or HJR Distal pulses intact with no bruits No edema Neuro non-focal Skin warm and dry No muscular weakness   Labs:   Lab Results  Component Value Date   WBC 6.1 06/17/2022   HGB 14.2 06/17/2022   HCT 42.0 06/17/2022   MCV 92 06/17/2022   PLT 266 06/17/2022   No results for input(s): "NA", "K", "CL", "CO2", "BUN", "CREATININE", "CALCIUM", "PROT", "BILITOT", "ALKPHOS", "ALT", "AST", "GLUCOSE" in the last 168 hours.  Invalid input(s): "LABALBU" Lab Results  Component Value Date   CKTOTAL 79 08/11/2008   CKMB 0.7 08/11/2008   TROPONINI <0.01        NO INDICATION OF MYOCARDIAL INJURY. 08/11/2008    Lab Results  Component Value Date   CHOL 129 08/28/2021   CHOL 158 09/04/2020   CHOL 148 04/26/2020   Lab Results  Component Value Date   HDL 40.70 08/28/2021   HDL 39.50 09/04/2020   HDL 32 (L) 04/26/2020   Lab Results  Component Value Date  LDLCALC 53 08/28/2021   LDLCALC 94 09/04/2020   LDLCALC 89 04/26/2020   Lab Results  Component Value Date   TRIG 176.0 (H) 08/28/2021   TRIG 124.0 09/04/2020   TRIG 153 (H) 04/26/2020   Lab Results  Component Value Date    CHOLHDL 3 08/28/2021   CHOLHDL 4 09/04/2020   CHOLHDL 4.6 (H) 04/26/2020   Lab Results  Component Value Date   LDLDIRECT 102.0 09/06/2018   LDLDIRECT 149.1 02/01/2012   LDLDIRECT 140.2 12/19/2009      Radiology: DG Lumbar Spine 2-3 Views  Result Date: 07/10/2022 CLINICAL DATA:  low back pain EXAM: LUMBAR SPINE - 2-3 VIEW COMPARISON:  CT dated March 23, 2022. FINDINGS: There are five non-rib bearing lumbar-type vertebral bodies. There is normal alignment. There is no evidence for acute fracture or subluxation. Intervertebral disc spaces are relatively preserved. Lower lumbar facet arthropathy. Nonobstructive 3 mm LEFT-sided nephrolithiasis. IMPRESSION: 1. Mild degenerative changes of the lumbar spine. 2. Nonobstructive 3 mm LEFT-sided nephrolithiasis. Electronically Signed   By: Valentino Saxon M.D.   On: 07/10/2022 17:10   DG HIP UNILAT WITH PELVIS 2-3 VIEWS RIGHT  Result Date: 07/10/2022 CLINICAL DATA:  hip pain EXAM: DG HIP (WITH OR WITHOUT PELVIS) 2-3V RIGHT COMPARISON:  None Available. FINDINGS: No acute fracture or dislocation. Mild degenerative changes of the RIGHT hip. Degenerative changes of the lower lumbar spine. Included view of the contralateral hip demonstrates mild degenerative changes. No area of erosion or osseous destruction. No unexpected radiopaque foreign body. Soft tissues are unremarkable. IMPRESSION: Mild degenerative changes of the RIGHT hip. If there is a persistent clinical concern for nondisplaced hip or pelvic fracture, recommend dedicated pelvic MRI. Electronically Signed   By: Valentino Saxon M.D.   On: 07/10/2022 17:07   MR ANGIO HEAD WO CONTRAST  Result Date: 07/10/2022  [facility] [address], [place], [place] [address] [phone] NEUROIMAGING REPORT STUDY DATE: 07/09/2022 PATIENT NAME: PORSCHEA BATTE DOB: 1950/07/06 MRN: [id number] ORDERING CLINICIAN: Dr Leonie Man CLINICAL HISTORY:  32 year patient with new right temporal  headaches COMPARISON FILMS:  none EXAM: MRA Brain wo TECHNIQUE: 2d and 3 d TOF images were obtained and reconstructed to create angiographic effect of vessels of intracranial circulation at circle of willis CONTRAST:  none IMAGING SITE: [facility] FINDINGS: Both internal carotid arteries show normal flow and calibre in their petrous, cavernous and terminal supraclinoid portions. Both middle and anterior cerebral arteries show normal flow and calibre. Both vertebral arteries are codominant without stenosis and basilar artery shows normal flow and calibre. Both posterior cerebral arteries show normal flow and calibre. There are no definite visualized aneurysms noted though aneurysms less than 3 mm are not adequately visualized on MRA due to technical limitations.    Unremarkable MRA of brain wo contrast showing no significant narrowing of large and medium size intracranial arteries. INTERPRETING PHYSICIAN: Antony Contras, MD Certified in  Neuroimaging by [place] of Neuroimaging and [employer]rological Subspecialities  MR BRAIN W WO CONTRAST  Result Date: 07/10/2022  [facility] [address] [place], [place] [address] [phone] NEUROIMAGING REPORT STUDY DATE: 07/09/2022 PATIENT NAME: SHANTORIA DELMONICO DOB: 07/16/1950 MRN: [id number] ORDERING CLINICIAN: Dr Leonie Man CLINICAL HISTORY:  71 year patient with new right temporal headaches COMPARISON FILMS:  none EXAM: MRI Brain w/wo TECHNIQUE: sagittal T 1, axial T1, T 2, FLAIR, DWI, ADC map, SWI, coronal T 2 and post contrast axial and coronal T 1 images were  obtained thru the brain CONTRAST: 6 ml iv gadopiclenol IMAGING SITE:  [facility] FINDINGS: The brain parenchyma shows mild age related changes of chronic small vessel disease and generalized cerebral atrophy. DWI imaging is negative for acute ischemia. SWI images do not show significant microhemorrhages.The subarachnoid spaces and ventricular system appear  normal. Cortical sulci and guyri appear normal.Extraaxial brain structures appear normal. Calvarium shows no abnormalities. Orbits appear normal. Paranasal sinuses show mild inflammatory changes and mucus retension cyst /polyp in right maxillary antrum.Pituitary gland and cerebellar tonsils appear normal. Visualized portion of upper cervical spine appears normal. Flow voids of large vessels of intracranial circulation appear normal. Post contrast images do not show any abnormal enhancement.    MRI Brain w/wo contrast showing age related changes of chronic small vessel disease and generalized cerebral atrophy. INTERPRETING PHYSICIAN: Antony Contras, MD Certified in  Neuroimaging by [place] of Neuroimaging and [place] [employer] for Neurological Subspecialities  DG Knee AP/LAT W/Sunrise Left  Result Date: 07/08/2022 CLINICAL DATA:  Pain EXAM: LEFT KNEE 3 VIEWS COMPARISON:  None Available. FINDINGS: No evidence of fracture, dislocation, or joint effusion. No evidence of arthropathy or other focal bone abnormality. Soft tissues are unremarkable. IMPRESSION: Negative. Electronically Signed   By: Dorise Bullion III M.D.   On: 07/08/2022 13:59   VAS US CAROTID  Result Date: 07/03/2022 Carotid Arterial Duplex Study Patient Name:  KEIMANI PAIR  Date of Exam:   07/02/2022 Medical Rec #: [id number]       Accession #:    [id number] Date of Birth: 24-Oct-1950       Patient Gender: F Patient Age:   48 years Exam Location:  [facility] Procedure:      VAS US CAROTID Referring Phys: Sarina Ill --------------------------------------------------------------------------------  Indications:       Visual disturbance. Headaches Risk Factors:      Hypertension, hyperlipidemia. Comparison Study:  No prior exam Performing Technologist: Alvia Grove RVT  Examination Guidelines: A complete evaluation includes B-mode imaging, spectral Doppler, color Doppler, and power Doppler as needed of all accessible portions  of each vessel. Bilateral testing is considered an integral part of a complete examination. Limited examinations for reoccurring indications may be performed as noted.  Right Carotid Findings: +----------+--------+--------+--------+------------------+--------+           PSV cm/sEDV cm/sStenosisPlaque DescriptionComments +----------+--------+--------+--------+------------------+--------+ CCA Prox  102     16                                         +----------+--------+--------+--------+------------------+--------+ CCA Mid   99      20                                         +----------+--------+--------+--------+------------------+--------+ CCA Distal94      22                                         +----------+--------+--------+--------+------------------+--------+ ICA Prox  99      34      Normal                             +----------+--------+--------+--------+------------------+--------+ ICA Mid  110     35                                         +----------+--------+--------+--------+------------------+--------+ ICA Distal93      29                                tortuous +----------+--------+--------+--------+------------------+--------+ ECA       80      12                                         +----------+--------+--------+--------+------------------+--------+ +----------+--------+-------+----------------+-------------------+           PSV cm/sEDV cmsDescribe        Arm Pressure (mmHG) +----------+--------+-------+----------------+-------------------+ Subclavian69      3      Multiphasic, [id number]                 +----------+--------+-------+----------------+-------------------+ +---------+--------+--+--------+--+---------+ VertebralPSV cm/s44EDV cm/s12Antegrade +---------+--------+--+--------+--+---------+  Left Carotid Findings: +----------+--------+--------+--------+------------------+--------+           PSV cm/sEDV  cm/sStenosisPlaque DescriptionComments +----------+--------+--------+--------+------------------+--------+ CCA Prox  161     27                                         +----------+--------+--------+--------+------------------+--------+ CCA Mid   84      15                                         +----------+--------+--------+--------+------------------+--------+ CCA Distal91      18              heterogenous               +----------+--------+--------+--------+------------------+--------+ ICA Prox  67      22      1-39%   heterogenous               +----------+--------+--------+--------+------------------+--------+ ICA Mid   78      27                                         +----------+--------+--------+--------+------------------+--------+ ICA Distal83      29                                         +----------+--------+--------+--------+------------------+--------+ ECA       52      3                                          +----------+--------+--------+--------+------------------+--------+ +----------+--------+--------+----------------+-------------------+           PSV cm/sEDV cm/sDescribe        Arm Pressure (mmHG) +----------+--------+--------+----------------+-------------------+ [id number]     2       Multiphasic, [id number]                 +----------+--------+--------+----------------+-------------------+ +---------+--------+--+--------+--+---------+  VertebralPSV cm/s60EDV cm/s16Antegrade +---------+--------+--+--------+--+---------+   Summary: Right Carotid: There is no evidence of stenosis in the right ICA. Left Carotid: Velocities in the left ICA are consistent with a 1-39% stenosis. Vertebrals:  Bilateral vertebral arteries demonstrate antegrade flow. Subclavians: Normal flow hemodynamics were seen in bilateral subclavian              arteries. *See table(s) above for measurements and observations.  Electronically signed by Orlie Pollen on 07/03/2022 at 5:53:25 PM.    Final      EKG: SR rate 89 normal 01/25/20 07/13/2022 SR rate 94 normal    ASSESSMENT AND PLAN:   1. HLD:  Premature family history of CAD Calcium score 70 th percentile on statin target LDL < 70   2. Chest Pain:  Normal ETT in 2019 CRF;s HTN, HLD, and premature family history see above Normal myovue 05/29/21 no ischemia Observe   3. ILD/Asthma:  No active wheezing continue symbicort PFT;s ok f/u Dr Eugenie Birks   4. GERD:  With esophageal dilatation low carb diet Continue pepcid and protonix   5. Rheum:  positive Sjogrens antibody   6. HTN:  improved on losartan   F/U pulmonary  F/U cards in a year   Signed: Jenkins Rouge 07/13/2022, 5:37 PM

## 2022-07-14 ENCOUNTER — Ambulatory Visit: Payer: PPO | Attending: Cardiovascular Disease | Admitting: Cardiovascular Disease

## 2022-07-14 ENCOUNTER — Encounter: Payer: Self-pay | Admitting: Cardiovascular Disease

## 2022-07-14 VITALS — BP 126/76 | HR 94 | Ht 63.0 in | Wt 143.4 lb

## 2022-07-14 DIAGNOSIS — R079 Chest pain, unspecified: Secondary | ICD-10-CM | POA: Diagnosis not present

## 2022-07-14 DIAGNOSIS — J849 Interstitial pulmonary disease, unspecified: Secondary | ICD-10-CM | POA: Diagnosis not present

## 2022-07-14 DIAGNOSIS — E785 Hyperlipidemia, unspecified: Secondary | ICD-10-CM | POA: Diagnosis not present

## 2022-07-14 DIAGNOSIS — I10 Essential (primary) hypertension: Secondary | ICD-10-CM | POA: Diagnosis not present

## 2022-07-14 NOTE — Telephone Encounter (Signed)
Dr. Rush Landmark is the one with expertise regarding pancreas, we can help him get an appointment with our office.  If Dr. Rush Landmark cannot take him on, I am happy to accept him as a patient.

## 2022-07-14 NOTE — Patient Instructions (Addendum)
Medication Instructions:  Your physician recommends that you continue on your current medications as directed. Please refer to the Current Medication list given to you today.  *If you need a refill on your cardiac medications before your next appointment, please call your pharmacy*  Lab Work: If you have labs (blood work) drawn today and your tests are completely normal, you will receive your results only by: MyChart Message (if you have MyChart) OR A paper copy in the mail If you have any lab test that is abnormal or we need to change your treatment, we will call you to review the results.  Testing/Procedures: None ordered today.  Follow-Up: At [facility], you and your health needs are our priority.  As part of our continuing mission to provide you with exceptional heart care, we have created designated Provider Care Teams.  These Care Teams include your primary Cardiologist (physician) and Advanced Practice Providers (APPs -  Physician Assistants and Nurse Practitioners) who all work together to provide you with the care you need, when you need it.  We recommend signing up for the patient portal called "MyChart".  Sign up information is provided on this After Visit Summary.  MyChart is used to connect with patients for Virtual Visits (Telemedicine).  Patients are able to view lab/test results, encounter notes, upcoming appointments, etc.  Non-urgent messages can be sent to your provider as well.   To learn more about what you can do with MyChart, go to [email or web address].    Your next appointment:   6 month(s)  Provider:   Peter Nishan, MD     

## 2022-07-28 DIAGNOSIS — J301 Allergic rhinitis due to pollen: Secondary | ICD-10-CM | POA: Diagnosis not present

## 2022-07-28 DIAGNOSIS — J3081 Allergic rhinitis due to animal (cat) (dog) hair and dander: Secondary | ICD-10-CM | POA: Diagnosis not present

## 2022-07-28 DIAGNOSIS — J3089 Other allergic rhinitis: Secondary | ICD-10-CM | POA: Diagnosis not present

## 2022-08-03 ENCOUNTER — Other Ambulatory Visit: Payer: Self-pay | Admitting: *Deleted

## 2022-08-03 DIAGNOSIS — J301 Allergic rhinitis due to pollen: Secondary | ICD-10-CM | POA: Diagnosis not present

## 2022-08-03 DIAGNOSIS — J3089 Other allergic rhinitis: Secondary | ICD-10-CM | POA: Diagnosis not present

## 2022-08-03 DIAGNOSIS — J3081 Allergic rhinitis due to animal (cat) (dog) hair and dander: Secondary | ICD-10-CM | POA: Diagnosis not present

## 2022-08-03 DIAGNOSIS — M3501 Sicca syndrome with keratoconjunctivitis: Secondary | ICD-10-CM | POA: Diagnosis not present

## 2022-08-03 LAB — SEDIMENTATION RATE: Sed Rate: 9 mm/h (ref 0–30)

## 2022-08-03 NOTE — Telephone Encounter (Signed)
Appointment has been canceled.

## 2022-08-04 LAB — SJOGRENS SYNDROME-A EXTRACTABLE NUCLEAR ANTIBODY: SSA (Ro) (ENA) Antibody, IgG: 6.4 AI — AB

## 2022-08-04 LAB — C-REACTIVE PROTEIN: CRP: <3 mg/L (ref <8.0)

## 2022-08-04 LAB — C3 AND C4
C3 Complement: 116 mg/dL (ref 83–193)
C4 Complement: 19 mg/dL (ref 15–57)

## 2022-08-06 ENCOUNTER — Ambulatory Visit: Payer: PPO | Admitting: Sports Medicine

## 2022-08-07 DIAGNOSIS — H35373 Puckering of macula, bilateral: Secondary | ICD-10-CM | POA: Diagnosis not present

## 2022-08-07 DIAGNOSIS — H25813 Combined forms of age-related cataract, bilateral: Secondary | ICD-10-CM | POA: Diagnosis not present

## 2022-08-07 DIAGNOSIS — R519 Headache, unspecified: Secondary | ICD-10-CM | POA: Diagnosis not present

## 2022-08-07 DIAGNOSIS — M3500 Sicca syndrome, unspecified: Secondary | ICD-10-CM | POA: Diagnosis not present

## 2022-08-07 DIAGNOSIS — H10413 Chronic giant papillary conjunctivitis, bilateral: Secondary | ICD-10-CM | POA: Diagnosis not present

## 2022-08-10 DIAGNOSIS — J3089 Other allergic rhinitis: Secondary | ICD-10-CM | POA: Diagnosis not present

## 2022-08-10 DIAGNOSIS — J3081 Allergic rhinitis due to animal (cat) (dog) hair and dander: Secondary | ICD-10-CM | POA: Diagnosis not present

## 2022-08-10 DIAGNOSIS — J301 Allergic rhinitis due to pollen: Secondary | ICD-10-CM | POA: Diagnosis not present

## 2022-08-11 ENCOUNTER — Ambulatory Visit: Payer: PPO | Admitting: Internal Medicine

## 2022-08-12 ENCOUNTER — Encounter: Payer: Self-pay | Admitting: Neurology

## 2022-08-12 ENCOUNTER — Telehealth (INDEPENDENT_AMBULATORY_CARE_PROVIDER_SITE_OTHER): Payer: PPO | Admitting: Neurology

## 2022-08-12 ENCOUNTER — Telehealth: Payer: Self-pay | Admitting: Neurology

## 2022-08-12 DIAGNOSIS — G43909 Migraine, unspecified, not intractable, without status migrainosus: Secondary | ICD-10-CM

## 2022-08-12 DIAGNOSIS — G43109 Migraine with aura, not intractable, without status migrainosus: Secondary | ICD-10-CM

## 2022-08-12 MED ORDER — ZAVZPRET 10 MG/ACT NA SOLN
1.0000 | Freq: Every day | NASAL | 11 refills | Status: AC | PRN
Start: 2022-08-12 — End: ?

## 2022-08-12 MED ORDER — ZAVZPRET 10 MG/ACT NA SOLN: 10.0000 mg | Freq: Every day | NASAL | 0 refills | Status: AC | PRN

## 2022-08-12 NOTE — Progress Notes (Signed)
[facility]    Provider:  Dr Lucia Gaskins Requesting Provider: Myrlene Broker, * Primary Care Provider:  Myrlene Broker, MD  CC:  vision changes  Virtual Visit via Video Note  I connected with Sheri Andrews on 08/12/22 at  9:30 AM EDT by a video enabled telemedicine application and verified that I am speaking with the correct person using two identifiers.  Location: Patient: home Provider: office   I discussed the limitations of evaluation and management by telemedicine and the availability of in person appointments. The patient expressed understanding and agreed to proceed.   Follow Up Instructions:    I discussed the assessment and treatment plan with the patient. The patient was provided an opportunity to ask questions and all were answered. The patient agreed with the plan and demonstrated an understanding of the instructions.   The patient was advised to call back or seek an in-person evaluation if the symptoms worsen or if the condition fails to improve as anticipated.  I provided over 40 minutes of non-face-to-face time during this encounter.   This included previsit chart review, lab review, study imaging review outside of appointment, order entry, electronic health record documentation, patient education on the different diagnostic and therapeutic options, counseling and coordination of care, risks and benefits of management, compliance, or risk factor reduction    Anson Fret, MD   Follow-up August 12, 2022: We saw patient several months ago for headaches and vision changes.  At that time my interpretation was likely migraine with aura however given new symptoms of monocular vision loss and new onset headache after 49 we ordered an MRI of the brain and an MRA of the head as well as carotid Dopplers and blood work, we treated her with his abs are present in the clinic and gave her some Ubrelvy.  Carotid Dopplers were also fine along with  the MRI of the brain and the MRA of the head.  I reviewed her chart since we last saw her, she saw sports medicine for some back and hip problems, she also saw her pulmonologist and PFTs were within normal limits in March of this year and she stated she was feeling well, she also saw her cardiologist for chest pain and family history of CAD which would make triptans contraindicated  but tried imitrex and maxalt in the past.  No more vision changes. She has had a few migraines and loves the nasal spray. She loves the zavzpret. 4 migraines in a month and spray works great immediately, < 6 total headache days a month.   Reviewed images and findings from MRI of the brain and MRI of the head which were unremarkable as below.   MRI of the brain with and without and MRA head July 09, 2022: The brain parenchyma shows mild age related changes of chronic small vessel disease and generalized cerebral atrophy. DWI imaging is negative for acute ischemia. SWI images do not show significant microhemorrhages.The subarachnoid spaces and ventricular system appear normal. Cortical sulci and guyri appear normal.Extraaxial brain structures appear normal. Calvarium shows no abnormalities. Orbits appear normal. Paranasal sinuses show mild inflammatory changes and mucus retension cyst /polyp in right maxillary antrum.Pituitary gland and cerebellar tonsils appear normal. Visualized portion of upper cervical spine appears normal. Flow voids of large vessels of intracranial circulation appear normal. Post contrast images do not show any abnormal enhancement.    MRA head IMPRESSION:  Unremarkable MRA of brain wo contrast showing no significant narrowing of large  and medium size intracranial arteries.    Recent Results (from the past 2160 hour(s))  Sedimentation rate     Status: None   Collection Time: 06/17/22 12:14 PM  Result Value Ref Range   Sed Rate 3 0 - 40 mm/hr  C-reactive protein     Status: None   Collection Time:  06/17/22 12:14 PM  Result Value Ref Range   CRP <1 0 - 10 mg/L  CBC with Differential/Platelets     Status: None   Collection Time: 06/17/22 12:14 PM  Result Value Ref Range   WBC 6.1 3.4 - 10.8 x10E3/uL   RBC 4.57 3.77 - 5.28 x10E6/uL   Hemoglobin 14.2 11.1 - 15.9 g/dL   Hematocrit 16.1 09.6 - 46.6 %   MCV 92 79 - 97 fL   MCH 31.1 26.6 - 33.0 pg   MCHC 33.8 31.5 - 35.7 g/dL   RDW 04.5 40.9 - 81.1 %   Platelets 266 150 - 450 x10E3/uL   Neutrophils 53 Not Estab. %   Lymphs 38 Not Estab. %   Monocytes 7 Not Estab. %   Eos 1 Not Estab. %   Basos 0 Not Estab. %   Neutrophils Absolute 3.3 1.4 - 7.0 x10E3/uL   Lymphocytes Absolute 2.3 0.7 - 3.1 x10E3/uL   Monocytes Absolute 0.4 0.1 - 0.9 x10E3/uL   EOS (ABSOLUTE) 0.1 0.0 - 0.4 x10E3/uL   Basophils Absolute 0.0 0.0 - 0.2 x10E3/uL   Immature Granulocytes 1 Not Estab. %   Immature Grans (Abs) 0.0 0.0 - 0.1 x10E3/uL  Comprehensive metabolic panel     Status: None   Collection Time: 06/17/22 12:14 PM  Result Value Ref Range   Glucose 98 70 - 99 mg/dL   BUN 16 8 - 27 mg/dL   Creatinine, Ser 9.14 0.57 - 1.00 mg/dL   eGFR 60 >78 GN/FAO/1.30   BUN/Creatinine Ratio 16 12 - 28   Sodium 142 134 - 144 mmol/L   Potassium 4.4 3.5 - 5.2 mmol/L   Chloride 104 96 - 106 mmol/L   CO2 25 20 - 29 mmol/L   Calcium 9.9 8.7 - 10.3 mg/dL   Total Protein 7.0 6.0 - 8.5 g/dL   Albumin 4.6 3.8 - 4.8 g/dL   Globulin, Total 2.4 1.5 - 4.5 g/dL   Albumin/Globulin Ratio 1.9 1.2 - 2.2   Bilirubin Total 0.3 0.0 - 1.2 mg/dL   Alkaline Phosphatase 50 44 - 121 IU/L   AST 21 0 - 40 IU/L   ALT 15 0 - 32 IU/L  TSH Rfx on Abnormal to Free T4     Status: None   Collection Time: 06/17/22 12:15 PM  Result Value Ref Range   TSH 2.120 0.450 - 4.500 uIU/mL  Pulmonary function test     Status: None   Collection Time: 07/07/22  9:03 AM  Result Value Ref Range   FVC-Pre 2.55 L   FVC-%Pred-Pre 91 %   FVC-Post 2.60 L   FVC-%Pred-Post 92 %   FVC-%Change-Post 1 %    FEV1-Pre 2.03 L   FEV1-%Pred-Pre 96 %   FEV1-Post 2.07 L   FEV1-%Pred-Post 98 %   FEV1-%Change-Post 2 %   FEV6-Pre 2.54 L   FEV6-%Pred-Pre 94 %   FEV6-Post 2.60 L   FEV6-%Pred-Post 97 %   FEV6-%Change-Post 2 %   Pre FEV1/FVC ratio 80 %   FEV1FVC-%Pred-Pre 105 %   Post FEV1/FVC ratio 80 %   FEV1FVC-%Change-Post 0 %   Pre FEV6/FVC Ratio 99 %  FEV6FVC-%Pred-Pre 104 %   Post FEV6/FVC ratio 100 %   FEV6FVC-%Pred-Post 104 %   FEV6FVC-%Change-Post 0 %   FEF 25-75 Pre 2.03 L/sec   FEF2575-%Pred-Pre 116 %   FEF 25-75 Post 1.83 L/sec   FEF2575-%Pred-Post 104 %   FEF2575-%Change-Post -9 %   RV 2.11 L   RV % pred 96 %   TLC 5.02 L   TLC % pred 102 %   DLCO unc 21.16 ml/min/mmHg   DLCO unc % pred 113 %   DLCO cor 20.67 ml/min/mmHg   DLCO cor % pred 110 %   DL/VA 9.60 ml/min/mmHg/L   DL/VA % pred 454 %  C-reactive protein     Status: None   Collection Time: 08/03/22  2:55 PM  Result Value Ref Range   CRP <3.0 <8.0 mg/L  Sjogrens syndrome-A extractable nuclear antibody     Status: Abnormal   Collection Time: 08/03/22  2:55 PM  Result Value Ref Range   SSA (Ro) (ENA) Antibody, IgG 6.4 POS (A) <1.0 NEG AI  C3 and C4     Status: None   Collection Time: 08/03/22  2:55 PM  Result Value Ref Range   C3 Complement 116 83 - 193 mg/dL   C4 Complement 19 15 - 57 mg/dL  Sedimentation rate     Status: None   Collection Time: 08/03/22  2:55 PM  Result Value Ref Range   Sed Rate 9 0 - 30 mm/h      IMPRESSION:  MRI Brain w/wo contrast showing age related changes of chronic small vessel disease and generalized cerebral atrophy.   Patient complains of symptoms per HPI as well as the following symptoms: migraine . Pertinent negatives and positives per HPI. All others negative   HPI 06/17/2022:  Sheri Andrews is a 72 y.o. female here as requested by Myrlene Broker, * for headaches and vision changes. She started having triangles move from one eye to the other last fall and got bigger  and bigger and bigger, headache followed, bad headache migrainous. She has a remote hx of migraines after red wine. Happened 3 times. About 30 minutes prior to headache. Behind the eyes, pulsating/pounding/throbbing, light sensitivity, no nausea, hurt to move, the vision changes resolved in an hour as did the headache but unclear because she fell asleep. Last one was unilateral vision changes stayed white complete monocular vision loss without a headache. Then went away but was was very different than the other episodes. Has not happened in a few months. She is having new pain in her right leg lower leg aching and numbness and right leg weakness trouble walking. Still having right leg pain radiating lateral leg and up to the hip. Still having pressure and pain in the right temporal area, continuous, pressure, behind the right eye. No other focal neurologic deficits, associated symptoms, inciting events or modifiable factors.  Reviewed bloodwork Also reviewed notes from ophthalmology, npo BRAO or other suspected etiology     Latest Ref Rng & Units 06/17/2022   12:14 PM 08/29/2021    1:15 PM 06/19/2021   12:39 PM  CBC  WBC 3.4 - 10.8 x10E3/uL 6.1  7.7  4.9   Hemoglobin 11.1 - 15.9 g/dL 09.8  11.9  14.7   Hematocrit 34.0 - 46.6 % 42.0  38.0  39.2   Platelets 150 - 450 x10E3/uL 266  224  239.0       Latest Ref Rng & Units 06/17/2022   12:14 PM 11/19/2021  1:04 PM 09/19/2021   11:01 AM  CMP  Glucose 70 - 99 mg/dL 98  94  95   BUN 8 - 27 mg/dL Creatinine 0.57 - 1.00 mg/dL 4.09  8.11  9.14   Sodium 134 - 144 mmol/L 142  140  139   Potassium 3.5 - 5.2 mmol/L 4.4  3.8  3.6   Chloride 96 - 106 mmol/L 104  103  106   CO2 20 - 29 mmol/L Calcium 8.7 - 10.3 mg/dL 9.9  9.3  9.3   Total Protein 6.0 - 8.5 g/dL 7.0  7.2  7.3   Total Bilirubin 0.0 - 1.2 mg/dL 0.3  0.5  0.4   Alkaline Phos 44 - 121 IU/L 50  40  47   AST 0 - 40 IU/L ALT 0 - 32 IU/L Review  of Systems: Patient complains of symptoms per HPI as well as the following symptoms monocular vision loss. Pertinent negatives and positives per HPI. All others negative.   Social History   Socioeconomic History   Marital status: Married    Spouse name: Not on file   Number of children: 2   Years of education: Not on file   Highest education level: Not on file  Occupational History   Occupation: retired  Tobacco Use   Smoking status: Never    Passive exposure: Never   Smokeless tobacco: Never  Vaping Use   Vaping Use: Never used  Substance and Sexual Activity   Alcohol use: Yes    Alcohol/week: 1.0 standard drink of alcohol    Types: 1 Standard drinks or equivalent per week    Comment: rare twice month   Drug use: No   Sexual activity: Yes    Partners: Male  Other Topics Concern   Not on file  Social History Narrative   Caffiene 2 cups daily.    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[employer]   Social Determinants of Corporate investment banker Strain: Low Risk  (02/12/2022)   Overall Financial Resource Strain (CARDIA)    Difficulty of Paying Living Expenses: Not hard at all  Food Insecurity: No Food Insecurity (02/12/2022)   Hunger Vital Sign    Worried About Running Out of Food in the Last Year: Never true    Ran Out of Food in the Last Year: Never true  Transportation Needs: No Transportation Needs (02/12/2022)   PRAPARE - Administrator, Civil Service (Medical): No    Lack of Transportation (Non-Medical): No  Physical Activity: Sufficiently Active (02/12/2022)   Exercise Vital Sign    Days of Exercise per Week: 7 days    Minutes of Exercise per Session: 30 min  Stress: No Stress Concern Present (02/12/2022)   [employer] of Occupational Health - Occupational Stress Questionnaire    Feeling of Stress : Not at all  Social Connections: Socially Integrated (02/12/2022)   Social Connection and Isolation Panel [NHANES]    Frequency of Communication with Friends and  Family: More than three times a week    Frequency of Social Gatherings with Friends and Family: More than three times a week    Attends Religious Services: More than 4 times per year    Active Member of [employer] or Organizations: Yes    Attends Banker Meetings: More than 4 times per year  Marital Status: Married  Catering manager Violence: Not At Risk (02/12/2022)   Humiliation, Afraid, Rape, and Kick questionnaire    Fear of Current or Ex-Partner: No    Emotionally Abused: No    Physically Abused: No    Sexually Abused: No    Family History  Problem Relation Age of Onset   Heart disease Mother    Diabetes Mother    Heart disease Father    Diabetes Sister    Heart disease Sister    Diabetes Brother    Heart disease Brother    Heart disease Brother    Cancer Maternal Uncle        type unknown   Throat cancer Paternal Uncle    Colon cancer Neg Hx    Stomach cancer Neg Hx    Rectal cancer Neg Hx    Pancreatic cancer Neg Hx    Migraines Neg Hx     Past Medical History:  Diagnosis Date   Acid reflux disease    Allergic rhinitis    Anxiety    Asthma    Barrett esophagus    Dyspnea    Dysthymia    Hypercholesterolemia    Hypertension    Irritable bowel syndrome    Nephrolithiasis    Osteopenia    Osteoporosis    Other chest pain    Pulmonary fibrosis    Sjogren's disease    Status post dilation of esophageal narrowing     Patient Active Problem List   Diagnosis Date Noted   Allergies 05/14/2022   COVID-19 virus infection 05/13/2022   Plantar fasciitis 11/25/2021   Foamy urine 08/29/2021   Hormone replacement therapy 06/27/2021   Abnormal cervical Papanicolaou smear 06/27/2021   Anemia 06/27/2021   Colitis 06/27/2021   Depressive disorder 06/27/2021   Insomnia 06/27/2021   Sjogren's syndrome 06/27/2021   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   High risk medication use 06/27/2021   ILD (interstitial lung disease) 05/12/2021   Left foot pain 01/24/2021    Right epiretinal membrane 01/03/2020   Nuclear sclerotic cataract of right eye 01/03/2020   Nuclear sclerotic cataract of left eye 01/03/2020   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   Anatomical narrow angle of right eye 01/03/2020   GAVE (gastric antral vascular ectasia)    Iron deficiency anemia due to chronic blood loss    Chronic pansinusitis 06/22/2018   Nasal turbinate hypertrophy 06/22/2018   Asthma 05/25/2018   Essential hypertension 06/29/2017   Routine general medical examination at a health care facility 05/15/2016   Family history of early CAD 03/09/2014   Osteopenia 02/01/2012   BARRETTS ESOPHAGUS 10/08/2008   NEPHROLITHIASIS 04/30/2007   OCD (obsessive compulsive disorder) 04/11/2007   HYPERCHOLESTEROLEMIA 04/08/2007   Gastroesophageal reflux disease 04/08/2007   IRRITABLE BOWEL SYNDROME 04/08/2007    Past Surgical History:  Procedure Laterality Date   ESOPHAGOGASTRODUODENOSCOPY (EGD) WITH PROPOFOL N/A 05/02/2019   Procedure: ESOPHAGOGASTRODUODENOSCOPY (EGD) WITH PROPOFOL;  Surgeon: Napoleon Form, MD;  Location: [facility];  Service: Endoscopy;  Laterality: N/A;  needing APC available   ESOPHAGOGASTRODUODENOSCOPY (EGD) WITH PROPOFOL N/A 08/11/2021   Procedure: ESOPHAGOGASTRODUODENOSCOPY (EGD) WITH PROPOFOL;  Surgeon: Napoleon Form, MD;  Location: [facility];  Service: Gastroenterology;  Laterality: N/A;   FACIAL COSMETIC SURGERY     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 6 each 11   albuterol (VENTOLIN HFA) 108 (90 Base) MCG/ACT inhaler 1-2 puff as needed 18 g 3   Artificial Tear Solution (TEARS NATURALE OP) Place 1 drop into both eyes See admin instructions. Use 1 drop into both eyes (scheduled) in the morning & may use 3 times  daily if needed for dry /irritated eyes.     atorvastatin (LIPITOR) 40 MG tablet Take 1 tablet (40 mg total) by mouth daily. 90 tablet 2   azelastine (OPTIVAR) 0.05 % ophthalmic solution INSTILL 1 DROP IN BOTH EYES TWICE DAILY AS NEEDED 18 mL 0   Azelastine HCl 0.15 % SOLN USE 2 SPRAYS IN EACH NOSTRIL IN THE MORNING AND AT BEDTIME 30 mL 0   benzonatate (TESSALON PERLES) 100 MG capsule Take 1 capsule (100 mg total) by mouth 3 (three) times daily as needed. 30 capsule 0   clonazePAM (KLONOPIN) 0.25 MG disintegrating tablet DISSOLVE ONE TABLET BY MOUTH EVERY DAY AS NEEDED 30 tablet 3   EPIPEN 2-PAK 0.3 MG/0.3ML SOAJ injection Inject 0.3 mg into the muscle as needed for anaphylaxis.   1   escitalopram (LEXAPRO) 20 MG tablet Take 1 tablet (20 mg total) by mouth daily. 90 tablet 3   estrogen, conjugated,-medroxyprogesterone (PREMPRO) 0.45-1.5 MG per tablet Take 1 tablet by mouth at bedtime.      fluticasone (FLONASE) 50 MCG/ACT nasal spray Place 2 sprays into both nostrils in the morning and at bedtime.     ipratropium (ATROVENT) 0.03 % nasal spray Place 2 sprays into both nostrils every 12 (twelve) hours. 30 mL 12   lansoprazole (PREVACID) 30 MG capsule TAKE 1 CAPSULE(30 MG) BY MOUTH TWICE DAILY BEFORE A MEAL 60 capsule 6   losartan (COZAAR) 50 MG tablet Take 1 tablet (50 mg total) by mouth daily. 90 tablet 3   meloxicam (MOBIC) 15 MG tablet Take 1 tablet (15 mg total) by mouth daily as needed for pain. 30 tablet 0   montelukast (SINGULAIR) 10 MG tablet Take 1 tablet (10 mg total) by mouth at bedtime. 90 tablet 3   Multiple Vitamin (MULTIVITAMIN) capsule Take 1 capsule by mouth daily.     Probiotic Product (PROBIOTIC COLON SUPPORT PO) Take 1 capsule by mouth at bedtime.      Spacer/Aero-Holding Chambers (VALVED HOLDING CHAMBER) DEVI SMARTSIG:Inhalation Via Inhaler As Directed     TRELEGY ELLIPTA 200-62.5-25 MCG/ACT AEPB Take 1 puff by mouth daily.     Ubrogepant (UBRELVY) 100 MG TABS Take 1 tablet (100  mg total) by mouth in the morning and at bedtime. Maximum 200mg  a day. 4 tablet 0   No current facility-administered medications for this visit.    Allergies as of 08/12/2022 - Review Complete 07/14/2022  Allergen Reaction Noted   Amoxicillin Other (See Comments) 08/05/2021   Wellbutrin [bupropion] Swelling 06/27/2021   Doxycycline hyclate Other (See Comments) 02/25/2005   Erythromycin ethylsuccinate Other (See Comments) 02/25/2005    Vitals: There were no vitals taken for this visit. Last Weight:  Wt Readings from Last 1 Encounters:  07/14/22 143 lb 6.4 oz (65 kg)   Last Height:   Ht Readings from Last 1 Encounters:  07/14/22 5\' 3"  (1.6 m)    Physical exam: Exam: Gen: NAD, conversant      CV: attempted, Could not perform over Web Video. Denies palpitations or chest pain or SOB. VS: Breathing at a normal rate. . Not febrile. Eyes: Conjunctivae clear without exudates or hemorrhage  Neuro: Detailed Neurologic Exam  Speech:    Speech is normal; fluent and spontaneous with normal comprehension.  Cognition:    The patient is oriented to person, place, and time;     recent and remote memory intact;     language fluent;     normal attention, concentration,     fund of knowledge Cranial Nerves:    The pupils are equal, round, and reactive to light. Cannot perform fundoscopic exam. Visual fields are full to finger confrontation. Extraocular movements are intact.  The face is symmetric with normal sensation. The palate elevates in the midline. Hearing intact. Voice is normal. Shoulder shrug is normal. The tongue has normal motion without fasciculations.   Coordination:    Normal finger to nose  Gait:    Normal native gait  Motor Observation:   no involuntary movements noted. Tone:    Appears normal  Posture:    Posture is normal. normal erect    Strength:    Strength is anti-gravity and symmetric in the upper and lower limbs.      Sensation: intact to LT          Assessment/Plan:  Likely migraine with aura however concerning symptoms monocular vision loss, right continuous temporal headache, new onset >50, new vision changes, intractable headache right temporal, vision changes right eye. Need MRI of the brain and MRA of the head and carotid dopplers.   We saw patient several months ago for headaches and vision changes.  At that time my interpretation was likely migraine with aura however given new symptoms of monocular vision loss and new onset headache after 50 we ordered labs and MRI of the brain and an MRA of the head as well as carotid Dopplers and blood work, we treated her with zavzpret in the clinic and gave her some Ubrelvy.  Carotid Dopplers were also fine along with the MRI of the brain and the MRA of the head.  I reviewed her chart since we last saw her, she saw sports medicine for some back and hip problems, she also saw her pulmonologist and PFTs were within normal limits in March of this year and she stated she was feeling well, she also saw her cardiologist for chest pain and history of CAD which would make triptans contraindicated.  No more vision changes. She has had a few migraines and loves the nasal spray. She loves the zavzpret.   4 migraines in a month and spray works great immediately, < 6 total headache days a month. Tried imitrex and maxalt in the past. But triptans now contraindicated due to CAD. Prescribe zavzpret.  Left samples for her until we get priot auth reviewed, also sent telephone call to front staff to get her a follow up  Meds ordered this encounter  Medications   Zavegepant HCl (ZAVZPRET) 10 MG/ACT SOLN    Sig: Place 1 spray into the nose daily as needed.    Dispense:  6 each    Refill:  11    4 migraines in a month and zavzpret works great immediately, < 6 total headache days a month. Tried imitrex and maxalt in the past. But triptans now contraindicated due to CAD.    Cc: Demetrius Revel, MD  Naomie Dean, MD  [facility] [address] [place], [place] [address]  Phone [phone] Fax [phone]

## 2022-08-12 NOTE — Patient Instructions (Signed)
Zavegepant Nasal Spray What is this medication? ZAVEGEPANT (za VE je pant) treats migraines. It works by blocking a substance in the body that causes migraines. It is not used to prevent migraines. This medicine may be used for other purposes; ask your health care provider or pharmacist if you have questions. COMMON BRAND NAME(S): ZAVZPRET What should I tell my care team before I take this medication? They need to know if you have any of these conditions: Kidney disease Liver disease An unusual or allergic reaction to zavegepant, other medications, foods, dyes, or preservatives Pregnant or trying to get pregnant Breast-feeding How should I use this medication? This medication is for use in the nose. Take it as directed on the prescription label. Do not use it more often than directed. Make sure that you are using your nasal spray correctly. Ask your care team if you have any questions. Talk to your care team about the use of this medication in children. Special care may be needed. Overdosage: If you think you have taken too much of this medicine contact a poison control center or emergency room at once. NOTE: This medicine is only for you. Do not share this medicine with others. What if I miss a dose? This does not apply. This medication is not for regular use. It should only be used as needed. What may interact with this medication? Decongestant nasal sprays This medication may affect how other medications work, and other medications may affect the way this medication works. Talk with your care team about all of the medications you take. They may suggest changes to your treatment plan to lower the risk of side effects and to make sure your medications work as intended. This list may not describe all possible interactions. Give your health care provider a list of all the medicines, herbs, non-prescription drugs, or dietary supplements you use. Also tell them if you smoke, drink alcohol, or use  illegal drugs. Some items may interact with your medicine. What should I watch for while using this medication? Visit your care team for regular checks on your progress. Tell your care team if your symptoms do not start to get better or if they get worse. What side effects may I notice from receiving this medication? Side effects that you should report to your care team as soon as possible: Allergic reactions--skin rash, itching, hives, swelling of the face, lips, tongue, or throat Side effects that usually do not require medical attention (report to your care team if they continue or are bothersome): Change in taste Dryness or irritation inside the nose Nausea Vomiting This list may not describe all possible side effects. Call your doctor for medical advice about side effects. You may report side effects to FDA at 1-800-FDA-1088. Where should I keep my medication? Keep out of the reach of children and pets. Store at room temperature between 20 and 25 degrees C (68 and 77 degrees F). Do not freeze. Get rid of any unused medication after the expiration date. To get rid of medications that are no longer needed or have expired: Take the medication to a medication take-back program. Check with your pharmacy or law enforcement to find a location. If you cannot return the medication, ask your pharmacist or care team how to get rid of this medication safely. NOTE: This sheet is a summary. It may not cover all possible information. If you have questions about this medicine, talk to your doctor, pharmacist, or health care provider.  2023 Elsevier/Gold Standard (  2021-07-02 00:00:00)  

## 2022-08-12 NOTE — Telephone Encounter (Signed)
Please call patient, to follow up with NP megan, Sheri Andrews or amy in 1 year I already explained she will be having an NP call her not a physician unless something changes thanks

## 2022-08-12 NOTE — Telephone Encounter (Signed)
Pt scheduled to see Sarah for 08/17/23 at 2:15pm

## 2022-08-12 NOTE — Telephone Encounter (Signed)
Per Dr. Lucia Gaskins gave 4 boxes of Zavzpret(samples) Samples placed at front desk for patient

## 2022-08-13 ENCOUNTER — Ambulatory Visit: Payer: PPO | Admitting: Gastroenterology

## 2022-08-16 NOTE — Progress Notes (Unsigned)
Office Visit Note  Patient: Sheri Andrews             Date of Birth: [birth year]           MRN: [id number]             PCP: Myrlene Broker, MD Referring: Myrlene Broker, * Visit Date: 08/17/2022   Subjective:  No chief complaint on file.   History of Present Illness: Sheri Andrews is a 72 y.o. female here for follow up ***   Previous HPI 05/26/22 Sheri Andrews is a 72 y.o. female here for follow up for Sjogren's syndrome with arthralgias, sicca symptoms, and associated ILD lung changes.  She had repeat CT scan in December that looked good with no progression of pulmonary changes on imaging.  Also had pulmonary clinic follow-up in December that looked good off maintenance treatment.  She was ill with COVID again treated with a course of Paxlovid also some complication with sinus infection to treated with Augmentin prednisone and single dose of Diflucan.  Did not get his sick as with her previous COVID illness but still has some lingering symptoms.  At this point she is back to doing just her inhaler therapy off any steroids.  Does have some left-sided jaw pain has not noticed any particular lesions in her mouth or jaw swelling.   Previous HPI 11/25/21 Sheri Andrews is a 72 y.o. female here for follow up sjogren's syndrome with arthralgias, sicca symptoms, and ILD.  Since her last visit she noticed foamy appearance changes to her urine and had greater than 1 g proteinuria.  Hydroxychloroquine was discontinued with improvement of the proteinuria.  However despite this improving her ongoing GI intolerance including a large weight loss this caused her to discontinue the Ofev.  Currently planning to wait a month before starting alternative medication for ILD according to pulmonology planning. Has not had any return of swelling or symptoms bothering her around the salivary glands or preauricular cyst since seeing ENT for this problem earlier.   Previous HPI 08/20/2021 Sheri Andrews is a 72 y.o. 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 Since our last visit she is doing about the same starting Ofev treatment with Dr. Isaiah Serge. She saw Dr. Annalee Genta completed course of antibiotics and had CT imaging of her head no evidence of swelling involvement to her salivary glands or lymph nodes appears more consistent for preauricular cyst.   Previous HPI 06/27/21 Sheri Andrews is a 72 y.o. female here for evaluation of Sjogren's syndrome. She had recent positive ANA and SSA Abs checked associated with new diagnosis of ILD. She has a previous history of moderate persistent asthma, GAVE, and barrett's esophagus. She had December visit with Dr. Francine Graven for moderate persistent asthma with obstructive PFT findings and HRCT demonstrated definite changes compared to 2021. So far PFTs have not demonstrated any significant restrictive changes. Besides pulmonary issues she has a longstanding history of dry eyes and mouth complaints. She attributed this to her chronic allergic rhinitis and conjunctivitis and frequent sinusitis infections. She has taken allergy shots and frequent antihistamine medication for these problems. She sees ophthalmology recent exam with cataracts and also has some epiretinal membrane changes in both eyes. She mostly treats dry eyes with artificial tears/systane/refresh. She estimates dry mouth symptoms are since about 20 years ago. Just drinks a lot of fluid to treat this. She has numerous crowns no particular mouth  ulcers or lesions. She has had oral thrush but was attributed to steroid inhaler for asthma. She has dysphagia reports inability to eat without drinking fluids between solid foods. 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 Lavon Paganini for this problem. She takes PPI long term for heartburn and cough. She has mild severity of  joint pain increased in her hands within the past few years. Does not have prolonged morning stiffness. Sometimes swelling in bilateral hands not associated with numbness or decreased strength. She notices some redness and nodules in distal finger joints. Intermittently swelling in the feet and ankles nothing persistently. She has some progressive 1st MTP bunions and left lateral foot tendonitis. Right cyst in front of right ear this is very chronic she discussed surgical removal with dermatologist a year ago. She sometimes expresses drainage from this during increased swelling. Recently worse with tenderness, swelling, and erythema. She denies any lymph node swelling, fevers, skin rashes, or raynaud's symptoms. Most recent metabolic panel eGFR 59 no history of problems. Most recent CBC slightly elevated relative lymphocytes 52.6% normal total level.   Labs reviewed ANA 1:40 speckled SSA 6.4 dsDNA, SSB, Scl-70 neg RF neg CCP neg ANCA neg CBC 52.6% lymphs     No Rheumatology ROS completed.   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   High risk medication use 06/27/2021   ILD (interstitial lung disease) (HCC) 05/12/2021   Left foot pain 01/24/2021   Right epiretinal membrane 01/03/2020   Nuclear sclerotic cataract of right eye 01/03/2020   Nuclear sclerotic cataract of left eye 01/03/2020   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   Anatomical narrow angle of right eye 01/03/2020   GAVE (gastric antral vascular ectasia)    Iron deficiency anemia due to chronic  blood loss    Chronic pansinusitis 06/22/2018   Nasal turbinate hypertrophy 06/22/2018   Asthma 05/25/2018   Essential hypertension 06/29/2017   Routine general medical examination at a health care facility 05/15/2016   Family history of early CAD 03/09/2014   Osteopenia 02/01/2012   BARRETTS ESOPHAGUS 10/08/2008   NEPHROLITHIASIS 04/30/2007   OCD (obsessive compulsive disorder) 04/11/2007   HYPERCHOLESTEROLEMIA 04/08/2007   Gastroesophageal reflux disease 04/08/2007   IRRITABLE BOWEL SYNDROME 04/08/2007    Past Medical History:  Diagnosis Date   Acid reflux disease    Allergic rhinitis    Anxiety    Asthma    Barrett esophagus    Dyspnea    Dysthymia    Hypercholesterolemia    Hypertension    Irritable bowel syndrome    Nephrolithiasis    Osteopenia    Osteoporosis    Other chest pain    Pulmonary fibrosis (HCC)    Sjogren's disease (HCC)    Status post dilation of esophageal narrowing     Family History  Problem Relation Age of Onset   Heart disease Mother    Diabetes Mother    Heart disease Father    Diabetes Sister    Heart disease Sister    Diabetes  Brother    Heart disease Brother    Heart disease Brother    Cancer Maternal Uncle        type unknown   Throat cancer Paternal Uncle    Colon cancer Neg Hx    Stomach cancer Neg Hx    Rectal cancer Neg Hx    Pancreatic cancer Neg Hx    Migraines Neg Hx    Past Surgical History:  Procedure Laterality Date   ESOPHAGOGASTRODUODENOSCOPY (EGD) WITH PROPOFOL N/A 05/02/2019   Procedure: ESOPHAGOGASTRODUODENOSCOPY (EGD) WITH PROPOFOL;  Surgeon: Napoleon Form, MD;  Location: [facility];  Service: Endoscopy;  Laterality: N/A;  needing APC available   ESOPHAGOGASTRODUODENOSCOPY (EGD) WITH PROPOFOL N/A 08/11/2021   Procedure: ESOPHAGOGASTRODUODENOSCOPY (EGD) WITH PROPOFOL;  Surgeon: Napoleon Form, MD;  Location: [facility];  Service: Gastroenterology;  Laterality: N/A;   FACIAL COSMETIC SURGERY      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  Administered Date(s) Administered   Fluad Quad(high Dose 65+) 01/21/2021, 02/12/2022   Influenza Split 02/01/2012   Influenza Whole 01/31/2009   Influenza, High Dose Seasonal PF 02/17/2016, 01/25/2017, 01/23/2019, 01/30/2020, 02/11/2021   Influenza,inj,Quad PF,6+ Mos 03/28/2015   Influenza,inj,quad, With Preservative 01/19/2019   Influenza-Unspecified 02/01/2017   PFIZER(Purple Top)SARS-COV-2 Vaccination 05/25/2019, 06/21/2019, 02/27/2020   Pneumococcal Conjugate-13 03/28/2015   Pneumococcal Polysaccharide-23 11/11/2015, 05/14/2016, 01/30/2020, 02/11/2021   Tdap 08/17/2011   Zoster Recombinat (Shingrix) 07/14/2017, 11/11/2017     Objective: Vital Signs: There were no vitals taken for this visit.   Physical Exam   Musculoskeletal Exam: ***  CDAI Exam: CDAI Score: -- Patient Global: --; Provider Global: -- Swollen: --; Tender: -- Joint Exam 08/17/2022   No joint exam has been documented for this visit   There is currently no information documented on the homunculus. Go to the Rheumatology activity and complete the homunculus joint exam.  Investigation: No additional findings.  Imaging: No results found.  Recent Labs: Lab Results  Component Value Date   WBC 6.1 06/17/2022   HGB 14.2 06/17/2022   PLT 266 06/17/2022   NA 142 06/17/2022   K 4.4 06/17/2022   CL 104 06/17/2022   CO2 25 06/17/2022   GLUCOSE 98 06/17/2022   BUN 16 06/17/2022   CREATININE 1.00 06/17/2022   BILITOT 0.3 06/17/2022   ALKPHOS 50 06/17/2022   AST 21 06/17/2022   ALT 15 06/17/2022   PROT 7.0 06/17/2022   ALBUMIN 4.6 06/17/2022   CALCIUM 9.9 06/17/2022   GFRAA  08/11/2008    >60        The eGFR has been calculated using the MDRD equation. This calculation has not been validated in  all clinical situations. eGFR's persistently <60 mL/min signify possible Chronic Kidney Disease.    Speciality Comments: No specialty comments available.  Procedures:  No procedures performed Allergies: Amoxicillin, Wellbutrin [bupropion], Doxycycline hyclate, and Erythromycin ethylsuccinate   Assessment / Plan:     Visit Diagnoses: No diagnosis found.  ***  Orders: No orders of the defined types were placed in this encounter.  No orders of the defined types were placed in this encounter.    Follow-Up Instructions: No follow-ups on file.   Fuller Plan, MD  Note - This record has been created using [employer].  Chart creation errors have been  sought, but may not always  have been located. Such creation errors do not reflect on  the standard of medical care.

## 2022-08-17 ENCOUNTER — Telehealth: Payer: Self-pay | Admitting: *Deleted

## 2022-08-17 ENCOUNTER — Encounter: Payer: Self-pay | Admitting: Internal Medicine

## 2022-08-17 ENCOUNTER — Ambulatory Visit: Payer: PPO | Attending: Internal Medicine | Admitting: Internal Medicine

## 2022-08-17 VITALS — BP 140/84 | HR 79 | Resp 16 | Ht 63.0 in | Wt 141.0 lb

## 2022-08-17 DIAGNOSIS — Z79899 Other long term (current) drug therapy: Secondary | ICD-10-CM | POA: Diagnosis not present

## 2022-08-17 DIAGNOSIS — J849 Interstitial pulmonary disease, unspecified: Secondary | ICD-10-CM | POA: Diagnosis not present

## 2022-08-17 DIAGNOSIS — M3501 Sicca syndrome with keratoconjunctivitis: Secondary | ICD-10-CM

## 2022-08-17 DIAGNOSIS — R809 Proteinuria, unspecified: Secondary | ICD-10-CM

## 2022-08-17 MED ORDER — HYDROXYCHLOROQUINE SULFATE 200 MG PO TABS
200.0000 mg | ORAL_TABLET | Freq: Every day | ORAL | 2 refills | Status: DC
Start: 2022-08-17 — End: 2022-11-17

## 2022-08-17 NOTE — Telephone Encounter (Signed)
Completed Zavzpret PA on CMM. Key: [id number]. Awaiting determination from [employer].

## 2022-08-18 LAB — PROTEIN / CREATININE RATIO, URINE
Creatinine, Urine: 84 mg/dL (ref 20–275)
Protein/Creat Ratio: 95 mg/g creat (ref 24–184)
Protein/Creatinine Ratio: 0.095 mg/mg creat (ref 0.024–0.184)
Total Protein, Urine: 8 mg/dL (ref 5–24)

## 2022-08-18 NOTE — Progress Notes (Signed)
Urine protein test is normal.

## 2022-08-19 ENCOUNTER — Ambulatory Visit: Payer: PPO | Admitting: Cardiovascular Disease

## 2022-09-02 DIAGNOSIS — J301 Allergic rhinitis due to pollen: Secondary | ICD-10-CM | POA: Diagnosis not present

## 2022-09-02 DIAGNOSIS — J3089 Other allergic rhinitis: Secondary | ICD-10-CM | POA: Diagnosis not present

## 2022-09-02 DIAGNOSIS — J3081 Allergic rhinitis due to animal (cat) (dog) hair and dander: Secondary | ICD-10-CM | POA: Diagnosis not present

## 2022-09-04 ENCOUNTER — Encounter: Payer: Self-pay | Admitting: Internal Medicine

## 2022-09-14 NOTE — Telephone Encounter (Signed)
Pharmacy Patient Advocate Encounter  Received notification from [employer] that the request for prior authorization for Zavzpret 10MG /ACT solution has been denied due to see below.      Please be advised we currently do not have a Pharmacist to review denials, therefore you will need to process appeals accordingly as needed. Thanks for your support at this time.  There was no denial letter sent or attached to the denial in CMM-There is an Eappeal on CMM available.

## 2022-09-16 NOTE — Telephone Encounter (Addendum)
Spoke with the patient. She states the Zavzpret works very well and very quickly for her. Sheri Andrews was ok but not as effective as Zavzpret. Pt is happy for [place] to try to appeal decision.   I have an appeal pending on [employer]. I faxed office notes to [employer]s to attach to the KEY: [facility]

## 2022-09-16 NOTE — Telephone Encounter (Signed)
We should appeal. Triptans are contraindicated but patient has tried Imitrex and Maxalt in the past. She has also been given Bernita Raisin. I sent her a message to see how it worked for her so we can include that in the appeal letter. She was only given a few samples. I don't see an active prescription.

## 2022-09-17 NOTE — Telephone Encounter (Signed)
Appeal sent to [employer] via [employer]. Key: Estill Batten is still the same key for the case. Will update when we hear back from insurance.

## 2022-09-19 ENCOUNTER — Other Ambulatory Visit (HOSPITAL_COMMUNITY): Payer: Self-pay

## 2022-09-19 NOTE — Telephone Encounter (Signed)
Pharmacy Patient Advocate Encounter  Prior Authorization for Zavzpret 10MG /ACT solution has been APPROVED by [employer] from 09/17/2022 to 04/20/2023.   PA # PA Case ID #: [id number]  Copay is $100.00 per [facility] Test claim.

## 2022-09-25 ENCOUNTER — Encounter: Payer: Self-pay | Admitting: Family Medicine

## 2022-09-25 ENCOUNTER — Ambulatory Visit (INDEPENDENT_AMBULATORY_CARE_PROVIDER_SITE_OTHER): Payer: PPO | Admitting: Family Medicine

## 2022-09-25 VITALS — BP 122/82 | HR 80 | Temp 97.6°F | Ht 63.0 in | Wt 141.0 lb

## 2022-09-25 DIAGNOSIS — L03211 Cellulitis of face: Secondary | ICD-10-CM | POA: Diagnosis not present

## 2022-09-25 DIAGNOSIS — L239 Allergic contact dermatitis, unspecified cause: Secondary | ICD-10-CM | POA: Diagnosis not present

## 2022-09-25 MED ORDER — METHYLPREDNISOLONE ACETATE 80 MG/ML IJ SUSP
80.0000 mg | Freq: Once | INTRAMUSCULAR | Status: AC
Start: 2022-09-25 — End: 2022-09-25
  Administered 2022-09-25: 80 mg via INTRAMUSCULAR

## 2022-09-25 MED ORDER — SULFAMETHOXAZOLE-TRIMETHOPRIM 800-160 MG PO TABS
1.0000 | ORAL_TABLET | Freq: Two times a day (BID) | ORAL | 0 refills | Status: DC
Start: 2022-09-25 — End: 2022-11-17

## 2022-09-25 MED ORDER — MUPIROCIN 2 % EX OINT
1.0000 | TOPICAL_OINTMENT | Freq: Two times a day (BID) | CUTANEOUS | 0 refills | Status: AC
Start: 2022-09-25 — End: ?

## 2022-09-25 MED ORDER — PREDNISONE 20 MG PO TABS
40.0000 mg | ORAL_TABLET | Freq: Every day | ORAL | 0 refills | Status: DC
Start: 2022-09-25 — End: 2022-11-17

## 2022-09-25 NOTE — Progress Notes (Unsigned)
Subjective:     Patient ID: Sheri Andrews, female    DOB: 26-Apr-1950, 72 y.o.   MRN: [id number]  Chief Complaint  Patient presents with   Rash    Rash on nose, starting to peel. Thinks may be [place]    HPI  Discussed the use of AI scribe software for clinical note transcription with the patient, who gave verbal consent to proceed.  History of Present Illness          C/o painful rash on nose and swelling of nasal passages x 4-5 days after coming in contact with a vine in her yard.    Health Maintenance Due  Topic Date Due   DTaP/Tdap/Td (2 - Td or Tdap) 08/16/2021   MAMMOGRAM  07/31/2022    Past Medical History:  Diagnosis Date   Acid reflux disease    Allergic rhinitis    Anxiety    Asthma    Barrett esophagus    Dyspnea    Dysthymia    Hypercholesterolemia    Hypertension    Irritable bowel syndrome    Nephrolithiasis    Osteopenia    Osteoporosis    Other chest pain    Pulmonary fibrosis (HCC)    Sjogren's disease (HCC)    Status post dilation of esophageal narrowing     Past Surgical History:  Procedure Laterality Date   ESOPHAGOGASTRODUODENOSCOPY (EGD) WITH PROPOFOL N/A 05/02/2019   Procedure: ESOPHAGOGASTRODUODENOSCOPY (EGD) WITH PROPOFOL;  Surgeon: Napoleon Form, MD;  Location: [facility];  Service: Endoscopy;  Laterality: N/A;  needing APC available   ESOPHAGOGASTRODUODENOSCOPY (EGD) WITH PROPOFOL N/A 08/11/2021   Procedure: ESOPHAGOGASTRODUODENOSCOPY (EGD) WITH PROPOFOL;  Surgeon: Napoleon Form, MD;  Location: [facility];  Service: Gastroenterology;  Laterality: N/A;   FACIAL COSMETIC SURGERY     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   TUBAL LIGATION  1986    Family History  Problem Relation Age of Onset   Heart disease Mother    Diabetes Mother    Heart disease Father    Diabetes Sister    Heart disease Sister    Diabetes Brother    Heart disease Brother    Heart disease Brother    Cancer  Maternal Uncle        type unknown   Throat cancer Paternal Uncle    Colon cancer Neg Hx    Stomach cancer Neg Hx    Rectal cancer Neg Hx    Pancreatic cancer Neg Hx    Migraines Neg Hx     Social History   Socioeconomic History   Marital status: Married    Spouse name: Not on file   Number of children: 2   Years of education: Not on file   Highest education level: Not on file  Occupational History   Occupation: retired  Tobacco Use   Smoking status: Never    Passive exposure: Never   Smokeless tobacco: Never  Vaping Use   Vaping Use: Never used  Substance and Sexual Activity   Alcohol use: Yes    Alcohol/week: 1.0 standard drink of alcohol    Types: 1 Standard drinks or equivalent per week    Comment: rare twice month   Drug use: No   Sexual activity: Yes    Partners: Male  Other Topics Concern   Not on file  Social History Narrative   Caffiene 2 cups daily.    Working retired Chief of Staff   Social  Determinants of Health   Financial Resource Strain: Low Risk  (02/12/2022)   Overall Financial Resource Strain (CARDIA)    Difficulty of Paying Living Expenses: Not hard at all  Food Insecurity: No Food Insecurity (02/12/2022)   Hunger Vital Sign    Worried About Running Out of Food in the Last Year: Never true    Ran Out of Food in the Last Year: Never true  Transportation Needs: No Transportation Needs (02/12/2022)   PRAPARE - Administrator, Civil Service (Medical): No    Lack of Transportation (Non-Medical): No  Physical Activity: Sufficiently Active (02/12/2022)   Exercise Vital Sign    Days of Exercise per Week: 7 days    Minutes of Exercise per Session: 30 min  Stress: No Stress Concern Present (02/12/2022)   [employer] of Occupational Health - Occupational Stress Questionnaire    Feeling of Stress : Not at all  Social Connections: Socially Integrated (02/12/2022)   Social Connection and Isolation Panel [NHANES]    Frequency of  Communication with Friends and Family: More than three times a week    Frequency of Social Gatherings with Friends and Family: More than three times a week    Attends Religious Services: More than 4 times per year    Active Member of [employer] or Organizations: Yes    Attends Engineer, structural: More than 4 times per year    Marital Status: Married  Catering manager Violence: Not At Risk (02/12/2022)   Humiliation, Afraid, Rape, and Kick questionnaire    Fear of Current or Ex-Partner: No    Emotionally Abused: No    Physically Abused: No    Sexually Abused: No    Outpatient Medications Prior to Visit  Medication Sig Dispense Refill   albuterol (VENTOLIN HFA) 108 (90 Base) MCG/ACT inhaler 1-2 puff as needed 18 g 3   Artificial Tear Solution (TEARS NATURALE OP) Place 1 drop into both eyes See admin instructions. Use 1 drop into both eyes (scheduled) in the morning & may use 3 times daily if needed for dry /irritated eyes.     atorvastatin (LIPITOR) 40 MG tablet Take 1 tablet (40 mg total) by mouth daily. 90 tablet 2   azelastine (OPTIVAR) 0.05 % ophthalmic solution INSTILL 1 DROP IN BOTH EYES TWICE DAILY AS NEEDED 18 mL 0   Azelastine HCl 0.15 % SOLN USE 2 SPRAYS IN EACH NOSTRIL IN THE MORNING AND AT BEDTIME 30 mL 0   benzonatate (TESSALON PERLES) 100 MG capsule Take 1 capsule (100 mg total) by mouth 3 (three) times daily as needed. 30 capsule 0   clonazePAM (KLONOPIN) 0.25 MG disintegrating tablet DISSOLVE ONE TABLET BY MOUTH EVERY DAY AS NEEDED 30 tablet 3   EPIPEN 2-PAK 0.3 MG/0.3ML SOAJ injection Inject 0.3 mg into the muscle as needed for anaphylaxis.   1   escitalopram (LEXAPRO) 20 MG tablet Take 1 tablet (20 mg total) by mouth daily. 90 tablet 3   estrogen, conjugated,-medroxyprogesterone (PREMPRO) 0.45-1.5 MG per tablet Take 1 tablet by mouth at bedtime.      fluticasone (FLONASE) 50 MCG/ACT nasal spray Place 2 sprays into both nostrils in the morning and at bedtime.      hydroxychloroquine (PLAQUENIL) 200 MG tablet Take 1 tablet (200 mg total) by mouth daily. 30 tablet 2   ipratropium (ATROVENT) 0.03 % nasal spray Place 2 sprays into both nostrils every 12 (twelve) hours. 30 mL 12   lansoprazole (PREVACID) 30 MG capsule TAKE 1  CAPSULE(30 MG) BY MOUTH TWICE DAILY BEFORE A MEAL 60 capsule 6   losartan (COZAAR) 50 MG tablet Take 1 tablet (50 mg total) by mouth daily. 90 tablet 3   meloxicam (MOBIC) 15 MG tablet Take 1 tablet (15 mg total) by mouth daily as needed for pain. 30 tablet 0   montelukast (SINGULAIR) 10 MG tablet Take 1 tablet (10 mg total) by mouth at bedtime. 90 tablet 3   Multiple Vitamin (MULTIVITAMIN) capsule Take 1 capsule by mouth daily.     Olopatadine HCl 0.7 % SOLN [place] 0.7 % eye drops INSTILL 1 DROP INTO OU QD     Probiotic Product (PROBIOTIC COLON SUPPORT PO) Take 1 capsule by mouth at bedtime.      TRELEGY ELLIPTA 200-62.5-25 MCG/ACT AEPB Take 1 puff by mouth daily.     Ubrogepant (UBRELVY) 100 MG TABS Take 1 tablet (100 mg total) by mouth in the morning and at bedtime. Maximum 200mg  a day. 4 tablet 0   Zavegepant HCl (ZAVZPRET) 10 MG/ACT SOLN Place 1 spray into the nose daily as needed. 6 each 11   Zavegepant HCl (ZAVZPRET) 10 MG/ACT SOLN Place 10 mg into the nose daily as needed. Deliver one spray in one nostril daily as  needed for Migraine Max 8 sprays monthly 4 each 0   Spacer/Aero-Holding Chambers (VALVED HOLDING CHAMBER) DEVI SMARTSIG:Inhalation Via Inhaler As Directed (Patient not taking: Reported on 08/17/2022)     No facility-administered medications prior to visit.    Allergies  Allergen Reactions   Amoxicillin Other (See Comments)    Upset stomach   Wellbutrin [Bupropion] Swelling   Doxycycline Hyclate Other (See Comments)    upset  stomach   Erythromycin Ethylsuccinate Other (See Comments)     upset stomach    Review of Systems  Constitutional:  Negative for chills and fever.  HENT:  Positive for congestion. Negative  for sinus pain and sore throat.   Respiratory:  Negative for cough and shortness of breath.   Gastrointestinal:  Negative for nausea and vomiting.  Skin:  Positive for rash.  Neurological:  Negative for dizziness, sensory change and headaches.       Objective:    Physical Exam Constitutional:      General: She is not in acute distress.    Appearance: She is not ill-appearing.  HENT:     Nose: Nasal tenderness and congestion present.      Comments: Area of erythema and skin peeling on tip of nose. Honey crusted dried discharge also noted. Mild edema.     Mouth/Throat:     Mouth: Mucous membranes are moist.     Pharynx: Oropharynx is clear.  Eyes:     Extraocular Movements: Extraocular movements intact.     Conjunctiva/sclera: Conjunctivae normal.  Cardiovascular:     Rate and Rhythm: Normal rate.  Pulmonary:     Effort: Pulmonary effort is normal.  Musculoskeletal:     Cervical back: Normal range of motion and neck supple.  Skin:    General: Skin is warm and dry.  Neurological:     General: No focal deficit present.     Mental Status: She is alert and oriented to person, place, and time.  Psychiatric:        Mood and Affect: Mood normal.        Behavior: Behavior normal.        Thought Content: Thought content normal.      BP 122/82 (BP Location: Left Arm, Patient Position:  Sitting, Cuff Size: Large)   Pulse 80   Temp 97.6 F (36.4 C) (Temporal)   Ht 5\' 3"  (1.6 m)   Wt 141 lb (64 kg)   SpO2 98%   BMI 24.98 kg/m  Wt Readings from Last 3 Encounters:  09/25/22 141 lb (64 kg)  08/17/22 141 lb (64 kg)  07/14/22 143 lb 6.4 oz (65 kg)       Assessment & Plan:   Problem List Items Addressed This Visit   None Visit Diagnoses     Cellulitis of face    -  Primary   Relevant Medications   sulfamethoxazole-trimethoprim (BACTRIM DS) 800-160 MG tablet   mupirocin ointment (BACTROBAN) 2 %   methylPREDNISolone acetate (DEPO-MEDROL) injection 80 mg (Completed)    Allergic contact dermatitis of face       Relevant Medications   predniSONE (DELTASONE) 20 MG tablet   methylPREDNISolone acetate (DEPO-MEDROL) injection 80 mg (Completed)      No acute distress.  Depo-Medrol 80 mg IM injection given in office.  Bactrim and Bactroban topical as well oral prednisone (start tomorrow).  Stay hydrated. Follow up if worsening or not improving in the next 2-3 days.   I have discontinued Bryanne Gangi. Font's Valved [employer]. I am also having her start on predniSONE, sulfamethoxazole-trimethoprim, and mupirocin ointment. Additionally, I am having her maintain her Probiotic Product (PROBIOTIC COLON SUPPORT PO), estrogen (conjugated)-medroxyprogesterone, EpiPen 2-Pak, Artificial Tear Solution (TEARS NATURALE OP), fluticasone, ipratropium, Azelastine HCl, albuterol, benzonatate, azelastine, lansoprazole, clonazePAM, atorvastatin, montelukast, losartan, escitalopram, Trelegy Ellipta, multivitamin, Ubrelvy, meloxicam, Zavzpret, Zavzpret, Olopatadine HCl, and hydroxychloroquine. We administered methylPREDNISolone acetate.  Meds ordered this encounter  Medications   predniSONE (DELTASONE) 20 MG tablet    Sig: Take 2 tablets (40 mg total) by mouth daily with breakfast.    Dispense:  10 tablet    Refill:  0    Order Specific Question:   Supervising Provider    Answer:   Hillard Danker A [[id number]]   sulfamethoxazole-trimethoprim (BACTRIM DS) 800-160 MG tablet    Sig: Take 1 tablet by mouth 2 (two) times daily.    Dispense:  14 tablet    Refill:  0    Order Specific Question:   Supervising Provider    Answer:   Hillard Danker A [[id number]]   mupirocin ointment (BACTROBAN) 2 %    Sig: Apply 1 Application topically 2 (two) times daily.    Dispense:  22 g    Refill:  0    Order Specific Question:   Supervising Provider    Answer:   Hillard Danker A [[id number]]   methylPREDNISolone acetate (DEPO-MEDROL) injection 80 mg

## 2022-09-25 NOTE — Patient Instructions (Addendum)
You received a Depo-Medrol 80 mg IM injection in the office today.  Start the oral steroids tomorrow with breakfast.  Start the antibiotic today with food and drink plenty of water.   Use the topical antibiotic as prescribed as well.  Warm compresses as discussed.  Follow-up if you have any new or worsening symptoms or if you are not improving in the next 4 to 5 days.

## 2022-09-30 ENCOUNTER — Other Ambulatory Visit: Payer: Self-pay | Admitting: *Deleted

## 2022-09-30 DIAGNOSIS — R809 Proteinuria, unspecified: Secondary | ICD-10-CM

## 2022-10-01 LAB — PROTEIN / CREATININE RATIO, URINE
Creatinine, Urine: 157 mg/dL (ref 20–275)
Protein/Creat Ratio: 127 mg/g creat (ref 24–184)
Protein/Creatinine Ratio: 0.127 mg/mg creat (ref 0.024–0.184)
Total Protein, Urine: 20 mg/dL (ref 5–24)

## 2022-10-05 ENCOUNTER — Other Ambulatory Visit: Payer: Self-pay | Admitting: Internal Medicine

## 2022-10-12 DIAGNOSIS — J301 Allergic rhinitis due to pollen: Secondary | ICD-10-CM | POA: Diagnosis not present

## 2022-10-12 DIAGNOSIS — J3089 Other allergic rhinitis: Secondary | ICD-10-CM | POA: Diagnosis not present

## 2022-10-12 DIAGNOSIS — J3081 Allergic rhinitis due to animal (cat) (dog) hair and dander: Secondary | ICD-10-CM | POA: Diagnosis not present

## 2022-10-13 DIAGNOSIS — H2511 Age-related nuclear cataract, right eye: Secondary | ICD-10-CM | POA: Diagnosis not present

## 2022-10-19 DIAGNOSIS — J3089 Other allergic rhinitis: Secondary | ICD-10-CM | POA: Diagnosis not present

## 2022-10-19 DIAGNOSIS — J301 Allergic rhinitis due to pollen: Secondary | ICD-10-CM | POA: Diagnosis not present

## 2022-10-19 DIAGNOSIS — J3081 Allergic rhinitis due to animal (cat) (dog) hair and dander: Secondary | ICD-10-CM | POA: Diagnosis not present

## 2022-10-26 DIAGNOSIS — J3081 Allergic rhinitis due to animal (cat) (dog) hair and dander: Secondary | ICD-10-CM | POA: Diagnosis not present

## 2022-10-26 DIAGNOSIS — J301 Allergic rhinitis due to pollen: Secondary | ICD-10-CM | POA: Diagnosis not present

## 2022-10-26 DIAGNOSIS — J3089 Other allergic rhinitis: Secondary | ICD-10-CM | POA: Diagnosis not present

## 2022-11-02 DIAGNOSIS — J3089 Other allergic rhinitis: Secondary | ICD-10-CM | POA: Diagnosis not present

## 2022-11-02 DIAGNOSIS — J3081 Allergic rhinitis due to animal (cat) (dog) hair and dander: Secondary | ICD-10-CM | POA: Diagnosis not present

## 2022-11-02 DIAGNOSIS — J301 Allergic rhinitis due to pollen: Secondary | ICD-10-CM | POA: Diagnosis not present

## 2022-11-03 DIAGNOSIS — H1045 Other chronic allergic conjunctivitis: Secondary | ICD-10-CM | POA: Diagnosis not present

## 2022-11-03 DIAGNOSIS — J301 Allergic rhinitis due to pollen: Secondary | ICD-10-CM | POA: Diagnosis not present

## 2022-11-03 DIAGNOSIS — J4530 Mild persistent asthma, uncomplicated: Secondary | ICD-10-CM | POA: Diagnosis not present

## 2022-11-03 DIAGNOSIS — J3089 Other allergic rhinitis: Secondary | ICD-10-CM | POA: Diagnosis not present

## 2022-11-06 ENCOUNTER — Telehealth: Payer: Self-pay | Admitting: Gastroenterology

## 2022-11-06 NOTE — Telephone Encounter (Signed)
PT woke up this morning and saw bright red blood in stool. Requesting call back to discuss symptoms.

## 2022-11-06 NOTE — Telephone Encounter (Signed)
Spoke with the patient. She tells me she is in a restaurant right now and cannot talk. Asks if she can be worked in to see Dr Lavon Paganini. Algis Downs her the openings with Dr Lavon Paganini are Debarah Crape. Patient will call her PCP to ask for evaluation.

## 2022-11-09 ENCOUNTER — Other Ambulatory Visit: Payer: Self-pay | Admitting: *Deleted

## 2022-11-09 DIAGNOSIS — J301 Allergic rhinitis due to pollen: Secondary | ICD-10-CM | POA: Diagnosis not present

## 2022-11-09 DIAGNOSIS — J3089 Other allergic rhinitis: Secondary | ICD-10-CM | POA: Diagnosis not present

## 2022-11-09 DIAGNOSIS — J3081 Allergic rhinitis due to animal (cat) (dog) hair and dander: Secondary | ICD-10-CM | POA: Diagnosis not present

## 2022-11-09 DIAGNOSIS — Z79899 Other long term (current) drug therapy: Secondary | ICD-10-CM | POA: Diagnosis not present

## 2022-11-09 LAB — CBC WITH DIFFERENTIAL/PLATELET
Absolute Monocytes: 475 cells/uL (ref 200–950)
Eosinophils Absolute: 49 cells/uL (ref 15–500)
Eosinophils Relative: 0.9 %
HCT: 37.7 % (ref 35.0–45.0)
MCHC: 34.2 g/dL (ref 32.0–36.0)
Monocytes Relative: 8.8 %
Neutro Abs: 2317 cells/uL (ref 1500–7800)
Platelets: 193 10*3/uL (ref 140–400)
RDW: 13.1 % (ref 11.0–15.0)
Total Lymphocyte: 47.2 %
WBC: 5.4 10*3/uL (ref 3.8–10.8)

## 2022-11-10 LAB — CBC WITH DIFFERENTIAL/PLATELET
Basophils Absolute: 11 cells/uL (ref 0–200)
Basophils Relative: 0.2 %
Hemoglobin: 12.9 g/dL (ref 11.7–15.5)
Lymphs Abs: 2549 cells/uL (ref 850–3900)
MCH: 31.2 pg (ref 27.0–33.0)
MCV: 91.3 fL (ref 80.0–100.0)
MPV: 8.9 fL (ref 7.5–12.5)
Neutrophils Relative %: 42.9 %
RBC: 4.13 10*6/uL (ref 3.80–5.10)

## 2022-11-10 LAB — COMPLETE METABOLIC PANEL WITH GFR
AG Ratio: 1.6 (calc) (ref 1.0–2.5)
ALT: 14 U/L (ref 6–29)
AST: 21 U/L (ref 10–35)
Albumin: 4.1 g/dL (ref 3.6–5.1)
Alkaline phosphatase (APISO): 38 U/L (ref 37–153)
BUN: 14 mg/dL (ref 7–25)
CO2: 27 mmol/L (ref 20–32)
Calcium: 9.4 mg/dL (ref 8.6–10.4)
Chloride: 106 mmol/L (ref 98–110)
Creat: 0.97 mg/dL (ref 0.60–1.00)
Globulin: 2.6 g/dL (calc) (ref 1.9–3.7)
Glucose, Bld: 92 mg/dL (ref 65–99)
Potassium: 3.9 mmol/L (ref 3.5–5.3)
Sodium: 141 mmol/L (ref 135–146)
Total Bilirubin: 0.4 mg/dL (ref 0.2–1.2)
Total Protein: 6.7 g/dL (ref 6.1–8.1)
eGFR: 62 mL/min/{1.73_m2} (ref >=60)

## 2022-11-11 NOTE — Progress Notes (Signed)
Office Visit Note  Patient: Sheri Andrews             Date of Birth: 1951-04-07           MRN: [id number]             PCP: Myrlene Broker, MD Referring: Myrlene Broker, * Visit Date: 11/17/2022   Subjective:  Follow-up (Patient states she wants to talk about HCQ. Patient states she is moving to [place] and needs a referral to a new Rheumatologist. Patient recently had cataract surgery.  )   History of Present Illness: Sheri Andrews is a 72 y.o. female here for follow up for Sjogren's syndrome with arthralgias sicca and ILD.  Now on hydroxychloroquine 200 mg daily has not noticed any side effect with resuming the medication.  About 2 weeks after starting repeated urine protein creatinine ratio test this was normal.  She has not yet seen the ophthalmologist for retinal toxicity screening exam due to having recent right-sided cataract surgery with planned upcoming left cataract surgery.  Has some increased joint pain and stiffness in multiple areas but attributes this to change in her usual activity with less regular walking for exercise and a lot more lifting and moving items as they just moved to [place] last week.  Also plans to reestablish with local rheumatology and pulmonology providers for this.   Previous HPI 08/17/2022 Sheri Andrews is a 72 y.o. female here for follow up follow-up for Sjogren's syndrome with arthralgias sicca and ILD.  Since last visit she has not been on any specific disease modifying medication regimen and is concerned whether we need to be doing more for management of the Sjogren's especially with lung involvement.  She is not having any severe progression or worsening of status although does still have some joint pain and stiffness lasting for part of every day notices this with her hands pretty often.   Previous HPI 05/26/22 Sheri Andrews is a 72 y.o. female here for follow up for Sjogren's syndrome with arthralgias, sicca symptoms, and  associated ILD lung changes.  She had repeat CT scan in December that looked good with no progression of pulmonary changes on imaging.  Also had pulmonary clinic follow-up in December that looked good off maintenance treatment.  She was ill with COVID again treated with a course of Paxlovid also some complication with sinus infection to treated with Augmentin prednisone and single dose of Diflucan.  Did not get his sick as with her previous COVID illness but still has some lingering symptoms.  At this point she is back to doing just her inhaler therapy off any steroids.  Does have some left-sided jaw pain has not noticed any particular lesions in her mouth or jaw swelling.   Previous HPI 11/25/21 Sheri Andrews is a 72 y.o. female here for follow up sjogren's syndrome with arthralgias, sicca symptoms, and ILD.  Since her last visit she noticed foamy appearance changes to her urine and had greater than 1 g proteinuria.  Hydroxychloroquine was discontinued with improvement of the proteinuria.  However despite this improving her ongoing GI intolerance including a large weight loss this caused her to discontinue the Ofev.  Currently planning to wait a month before starting alternative medication for ILD according to pulmonology planning. Has not had any return of swelling or symptoms bothering her around the salivary glands or preauricular cyst since seeing ENT for this problem earlier.   Previous HPI 08/20/2021 Sheri Andrews  is a 72 y.o. 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 Since our last visit she is doing about the same starting Ofev treatment with Dr. Isaiah Serge. She saw Dr. Annalee Genta completed course of antibiotics and had CT imaging of her head no evidence of swelling involvement to her salivary glands or lymph nodes appears more consistent for preauricular cyst.   Previous HPI 06/27/21 Sheri Andrews is a 72 y.o. female here for  evaluation of Sjogren's syndrome. She had recent positive ANA and SSA Abs checked associated with new diagnosis of ILD. She has a previous history of moderate persistent asthma, GAVE, and barrett's esophagus. She had December visit with Dr. Francine Graven for moderate persistent asthma with obstructive PFT findings and HRCT demonstrated definite changes compared to 2021. So far PFTs have not demonstrated any significant restrictive changes. Besides pulmonary issues she has a longstanding history of dry eyes and mouth complaints. She attributed this to her chronic allergic rhinitis and conjunctivitis and frequent sinusitis infections. She has taken allergy shots and frequent antihistamine medication for these problems. She sees ophthalmology recent exam with cataracts and also has some epiretinal membrane changes in both eyes. She mostly treats dry eyes with artificial tears/systane/refresh. She estimates dry mouth symptoms are since about 20 years ago. Just drinks a lot of fluid to treat this. She has numerous crowns no particular mouth ulcers or lesions. She has had oral thrush but was attributed to steroid inhaler for asthma. She has dysphagia reports inability to eat without drinking fluids between solid foods. 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 Lavon Paganini for this problem. She takes PPI long term for heartburn and cough. She has mild severity of joint pain increased in her hands within the past few years. Does not have prolonged morning stiffness. Sometimes swelling in bilateral hands not associated with numbness or decreased strength. She notices some redness and nodules in distal finger joints. Intermittently swelling in the feet and ankles nothing persistently. She has some progressive 1st MTP bunions and left lateral foot tendonitis. Right cyst in front of right ear this is very chronic she discussed surgical removal with  dermatologist a year ago. She sometimes expresses drainage from this during increased swelling. Recently worse with tenderness, swelling, and erythema. She denies any lymph node swelling, fevers, skin rashes, or raynaud's symptoms. Most recent metabolic panel eGFR 59 no history of problems. Most recent CBC slightly elevated relative lymphocytes 52.6% normal total level.   Labs reviewed ANA 1:40 speckled SSA 6.4 dsDNA, SSB, Scl-70 neg RF neg CCP neg ANCA neg CBC 52.6% lymphs   Review of Systems  Constitutional:  Positive for fatigue.  HENT:  Positive for mouth dryness. Negative for mouth sores.   Eyes:  Positive for dryness.  Respiratory:  Positive for shortness of breath.   Cardiovascular:  Negative for chest pain and palpitations.  Gastrointestinal:  Positive for constipation and diarrhea. Negative for blood in stool.  Endocrine: Negative for increased urination.  Genitourinary:  Negative for involuntary urination.  Musculoskeletal:  Positive for joint pain, joint pain, myalgias, morning stiffness, muscle tenderness and myalgias. Negative for gait problem, joint swelling and muscle weakness.  Skin:  Positive for sensitivity to sunlight. Negative for color change, rash and hair loss.  Allergic/Immunologic: Negative for susceptible to infections.  Neurological:  Positive for headaches. Negative for dizziness.  Hematological:  Negative for swollen glands.  Psychiatric/Behavioral:  Positive for sleep disturbance. Negative for depressed mood. The patient is not nervous/anxious.     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   ILD (interstitial lung disease) (HCC) 05/12/2021   Right epiretinal membrane 01/03/2020   Nuclear sclerotic cataract of right eye 01/03/2020   Nuclear sclerotic cataract of left eye 01/03/2020   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   Anatomical narrow angle of right eye 01/03/2020   GAVE (gastric antral vascular ectasia)    Iron deficiency anemia due to chronic blood loss    Chronic pansinusitis 06/22/2018   Nasal turbinate hypertrophy 06/22/2018   Asthma 05/25/2018   Essential hypertension 06/29/2017   Routine general medical examination at a health care facility 05/15/2016   Family history of early CAD 03/09/2014   Osteopenia 02/01/2012   BARRETTS ESOPHAGUS 10/08/2008   NEPHROLITHIASIS 04/30/2007   OCD (obsessive compulsive disorder) 04/11/2007   HYPERCHOLESTEROLEMIA 04/08/2007   Gastroesophageal reflux disease 04/08/2007   IRRITABLE BOWEL SYNDROME 04/08/2007    Past Medical History:  Diagnosis Date   Acid reflux disease    Allergic rhinitis    Anxiety    Asthma    Barrett esophagus    Dyspnea    Dysthymia    Hypercholesterolemia    Hypertension    Irritable bowel syndrome    Nephrolithiasis    Osteopenia    Osteoporosis    Other chest pain    Pulmonary fibrosis (HCC)    Sjogren's disease (HCC)    Status post dilation of esophageal narrowing     Family History  Problem Relation Age of Onset   Heart disease Mother    Diabetes Mother    Heart disease Father    Diabetes Sister    Heart disease Sister    Diabetes Brother    Heart disease Brother    Heart disease Brother    Cancer Maternal Uncle        type unknown   Throat cancer Paternal Uncle    Colon cancer Neg Hx    Stomach cancer Neg Hx    Rectal cancer Neg Hx    Pancreatic cancer Neg Hx    Migraines Neg Hx    Past Surgical History:  Procedure Laterality Date   CATARACT EXTRACTION Right    ESOPHAGOGASTRODUODENOSCOPY (EGD)  WITH PROPOFOL N/A 05/02/2019   Procedure: ESOPHAGOGASTRODUODENOSCOPY (EGD) WITH PROPOFOL;  Surgeon: Napoleon Form, MD;  Location: [facility];  Service: Endoscopy;  Laterality: N/A;  needing APC available   ESOPHAGOGASTRODUODENOSCOPY (EGD) WITH PROPOFOL N/A 08/11/2021   Procedure: ESOPHAGOGASTRODUODENOSCOPY (EGD) WITH PROPOFOL;  Surgeon: Napoleon Form, MD;  Location: [facility];  Service: Gastroenterology;  Laterality: N/A;   FACIAL COSMETIC SURGERY     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  Administered Date(s) Administered   Fluad Quad(high Dose 65+) 01/21/2021, 02/12/2022   Influenza Split 02/01/2012   Influenza Whole 01/31/2009   Influenza,  High Dose Seasonal PF 02/17/2016, 01/25/2017, 01/23/2019, 01/30/2020, 02/11/2021   Influenza,inj,Quad PF,6+ Mos 03/28/2015   Influenza,inj,quad, With Preservative 01/19/2019   Influenza-Unspecified 02/01/2017   PFIZER(Purple Top)SARS-COV-2 Vaccination 05/25/2019, 06/21/2019, 02/27/2020   Pneumococcal Conjugate-13 03/28/2015   Pneumococcal Polysaccharide-23 11/11/2015, 05/14/2016, 01/30/2020, 02/11/2021   Tdap 08/17/2011   Zoster Recombinant(Shingrix) 07/14/2017, 11/11/2017     Objective: Vital Signs: BP 122/80 (BP Location: Left Arm, Patient Position: Sitting, Cuff Size: Normal)   Pulse 80   Resp 12   Ht 5\' 3"  (1.6 m)   Wt 147 lb (66.7 kg)   BMI 26.04 kg/m    Physical Exam HENT:     Mouth/Throat:     Mouth: Mucous membranes are moist.     Pharynx: Oropharynx is clear.  Eyes:     Conjunctiva/sclera: Conjunctivae normal.  Cardiovascular:     Rate and Rhythm: Normal rate and regular rhythm.  Pulmonary:     Effort: Pulmonary effort is normal.     Breath sounds: Normal breath sounds.  Musculoskeletal:     Right lower leg: No edema.     Left lower leg: No  edema.  Lymphadenopathy:     Cervical: No cervical adenopathy.  Skin:    General: Skin is warm and dry.     Findings: No rash.  Neurological:     Mental Status: She is alert.  Psychiatric:        Mood and Affect: Mood normal.      Musculoskeletal Exam:  Elbows full ROM no tenderness or swelling Wrists full ROM no tenderness or swelling Fingers full ROM, no swelling, DIP heberdon's nodes present mildly tender to pressure Knees full ROM no tenderness or swelling  Investigation: No additional findings.  Imaging: No results found.  Recent Labs: Lab Results  Component Value Date   WBC 5.4 11/09/2022   HGB 12.9 11/09/2022   PLT 193 11/09/2022   NA 141 11/09/2022   K 3.9 11/09/2022   CL 106 11/09/2022   CO2 27 11/09/2022   GLUCOSE 92 11/09/2022   BUN 14 11/09/2022   CREATININE 0.97 11/09/2022   BILITOT 0.4 11/09/2022   ALKPHOS 50 06/17/2022   AST 21 11/09/2022   ALT 14 11/09/2022   PROT 6.7 11/09/2022   ALBUMIN 4.6 06/17/2022   CALCIUM 9.4 11/09/2022   GFRAA  08/11/2008    >60        The eGFR has been calculated using the MDRD equation. This calculation has not been validated in all clinical situations. eGFR's persistently <60 mL/min signify possible Chronic Kidney Disease.    Speciality Comments: No specialty comments available.  Procedures:  No procedures performed Allergies: Amoxicillin, Wellbutrin [bupropion], Doxycycline hyclate, and Erythromycin ethylsuccinate   Assessment / Plan:     Visit Diagnoses: Sjogren's syndrome with keratoconjunctivitis sicca (HCC) - Plan: Sedimentation rate, C-reactive protein, C3 and C4, hydroxychloroquine (PLAQUENIL) 200 MG tablet, Ambulatory referral to Rheumatology  No obvious disease activity today has persistent dryness but is complicated by the ongoing cataract surgeries.  No new visible photosensitive rashes joint pain looks more consistent with osteoarthritis could have component of arthralgias.  Will recheck sed  rate CRP and serum complements for evidence of systemic inflammatory activity.  Plan to continue hydroxychloroquine 200 mg daily.  Discussed possibility of trying titration to 300 mg if she has increased symptoms.  Sent referral to [facility] although no strong provider preference regarding sjogren syndrome expertise.  High risk medication use - hydroxychloroquine 200 mg daily.  Meloxicam 15 mg prescribed by Dr. Jean Rosenthal.Patient had a regular eye exam last year, not yet repeat this year. Pending recovery after cataract surgery but has close ophthalmology follow up.  ILD (interstitial lung disease) (HCC)  Faint exam findings no new DOE cough or chest pain.  Proteinuria, unspecified type - Plan: Protein / creatinine ratio, urine  Recheck urine protein creatinine ratio screening now on hydroxychloroquine.  Looks like the previous abnormal result was associated with Ofev treatment but is just monitoring progress with pulmonology for now.  Orders: Orders Placed This Encounter  Procedures   Sedimentation rate   C-reactive protein   Protein / creatinine ratio, urine   C3 and C4   Ambulatory referral to Rheumatology   Meds ordered this encounter  Medications   hydroxychloroquine (PLAQUENIL) 200 MG tablet    Sig: Take 1 tablet (200 mg total) by mouth daily.    Dispense:  90 tablet    Refill:  1     Follow-Up Instructions: No follow-ups on file.   Fuller Plan, MD  Note - This record has been created using [employer].  Chart creation errors have been sought, but may not always  have been located. Such creation errors do not reflect on  the standard of medical care.

## 2022-11-12 ENCOUNTER — Other Ambulatory Visit: Payer: Self-pay | Admitting: Internal Medicine

## 2022-11-12 DIAGNOSIS — M3501 Sicca syndrome with keratoconjunctivitis: Secondary | ICD-10-CM

## 2022-11-12 NOTE — Telephone Encounter (Signed)
Last Fill: 04/29/20224  Eye exam: not on file   Labs: 11/09/2022 normal  Next Visit: 11/17/2022  Last Visit: 08/17/2022  DX: Sjogren's syndrome with keratoconjunctivitis sicca    Current Dose per office note 08/17/2022: hydroxychloroquine 200 mg daily   Attempted to contact the patient and left a message to call the office back regarding a PLQ eye exam.   There is a regular eye exam from [facility] on 02/10/2022. Was not able to find a PLQ eye exam.   Okay to refill Plaquenil?

## 2022-11-17 ENCOUNTER — Encounter: Payer: Self-pay | Admitting: Internal Medicine

## 2022-11-17 ENCOUNTER — Ambulatory Visit: Payer: PPO | Attending: Internal Medicine | Admitting: Internal Medicine

## 2022-11-17 VITALS — BP 122/80 | HR 80 | Resp 12 | Ht 63.0 in | Wt 147.0 lb

## 2022-11-17 DIAGNOSIS — R809 Proteinuria, unspecified: Secondary | ICD-10-CM | POA: Diagnosis not present

## 2022-11-17 DIAGNOSIS — J849 Interstitial pulmonary disease, unspecified: Secondary | ICD-10-CM

## 2022-11-17 DIAGNOSIS — M3501 Sicca syndrome with keratoconjunctivitis: Secondary | ICD-10-CM | POA: Diagnosis not present

## 2022-11-17 DIAGNOSIS — Z79899 Other long term (current) drug therapy: Secondary | ICD-10-CM | POA: Diagnosis not present

## 2022-11-17 MED ORDER — HYDROXYCHLOROQUINE SULFATE 200 MG PO TABS
200.0000 mg | ORAL_TABLET | Freq: Every day | ORAL | 1 refills | Status: DC
Start: 2022-11-17 — End: 2023-06-30

## 2022-11-18 ENCOUNTER — Encounter: Payer: Self-pay | Admitting: Internal Medicine

## 2022-11-20 ENCOUNTER — Telehealth: Payer: Self-pay

## 2022-11-20 NOTE — Telephone Encounter (Signed)
Pt will Like Dr. Francine Graven recommendations for Pulmonologist is CLT, pls advise

## 2022-11-20 NOTE — Telephone Encounter (Signed)
Unable to reach patient.  Message copied and sent via mychart.

## 2022-11-20 NOTE — Telephone Encounter (Signed)
I do not have any personal recommendations but when I did a search for interstitial lung disease specialists in [place] Dr. Amada Kingfisher seems like a good option.  Luke A. Amada Kingfisher, MD  [facility] [address] [place], [place] [address]  [phone]  Thanks, JD

## 2022-11-25 ENCOUNTER — Telehealth: Payer: Self-pay | Admitting: Internal Medicine

## 2022-11-25 ENCOUNTER — Other Ambulatory Visit: Payer: Self-pay | Admitting: *Deleted

## 2022-11-25 DIAGNOSIS — Z79899 Other long term (current) drug therapy: Secondary | ICD-10-CM

## 2022-11-25 DIAGNOSIS — R809 Proteinuria, unspecified: Secondary | ICD-10-CM

## 2022-11-25 DIAGNOSIS — J849 Interstitial pulmonary disease, unspecified: Secondary | ICD-10-CM

## 2022-11-25 DIAGNOSIS — H1013 Acute atopic conjunctivitis, bilateral: Secondary | ICD-10-CM

## 2022-11-25 DIAGNOSIS — M3501 Sicca syndrome with keratoconjunctivitis: Secondary | ICD-10-CM

## 2022-11-25 DIAGNOSIS — M3500 Sicca syndrome, unspecified: Secondary | ICD-10-CM

## 2022-11-25 NOTE — Telephone Encounter (Signed)
Patient called requesting a referral to Dr. Amalia Hailey at [facility] in [place]. Fax #[phone]

## 2022-11-25 NOTE — Telephone Encounter (Signed)
Referral faxed, pending appt ?

## 2022-12-01 NOTE — Progress Notes (Deleted)
CARDIOLOGY CONSULT NOTE       Patient ID: Sheri Andrews MRN: [id number] DOB/AGE: 1950-08-04 72 y.o.  Admit date: (Not on file) Referring Physician: Okey Andrews Primary Physician: Sheri Broker, MD Primary Cardiologist: Sheri Andrews   HPI:  72 y.o. referred by Dr Sheri Andrews 01/26/20 for chest pain and family history of premature CAD 3/4 siblings have had MI She is being Rx with statin for HLD LDL 109 She had a normal Myovue in 2010 and normal ETT in March 2019 achieving 7 Mets and 102% PMHR She has had significant reflux and barrett's with esophageal stretching Rx with calcium blocker for HTN and carries a diagnosis of asthma with no smoking history   Coronary calcium score 8 in 2015 isolated to mid/distal LAD Coronary calcium score 72 01/25/20 70 th percentile  Myouve 05/29/21 normal no ischemia EF 62%   Likely had COVID in [place] and was sick for weeks travels a lot And was in [place] and [place]   ILD/Asthma  - sees Sheri Andrews feels better on Symbicort rather than Breztri 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   Retired Runner, broadcasting/film/video Worked in [employer] as well Considering move to [place] to be near her kids Normal Cr and started on Losartan for BP 02/12/22   No complaints happy her lungs are stable She does not want to try Cellcept until PFTls or CT look worse  Having right temporal headaches Seen by neurology Dr Sheri Andrews no acute abnormalities MRA of brain also normal   ***  ROS All other systems reviewed and negative except as noted above  Past Medical History:  Diagnosis Date   Acid reflux disease    Allergic rhinitis    Anxiety    Asthma    Barrett esophagus    Dyspnea    Dysthymia    Hypercholesterolemia    Hypertension    Irritable bowel syndrome    Nephrolithiasis    Osteopenia    Osteoporosis    Other chest pain    Pulmonary fibrosis (HCC)    Sjogren's disease (HCC)    Status post dilation of esophageal narrowing     Family History  Problem  Relation Age of Onset   Heart disease Mother    Diabetes Mother    Heart disease Father    Diabetes Sister    Heart disease Sister    Diabetes Brother    Heart disease Brother    Heart disease Brother    Cancer Maternal Uncle        type unknown   Throat cancer Paternal Uncle    Colon cancer Neg Hx    Stomach cancer Neg Hx    Rectal cancer Neg Hx    Pancreatic cancer Neg Hx    Migraines Neg Hx     Social History   Socioeconomic History   Marital status: Married    Spouse name: Not on file   Number of children: 2   Years of education: Not on file   Highest education level: Not on file  Occupational History   Occupation: retired  Tobacco Use   Smoking status: Never    Passive exposure: Never   Smokeless tobacco: Never  Vaping Use   Vaping status: Never Used  Substance and Sexual Activity   Alcohol use: Yes    Alcohol/week: 1.0 standard drink of alcohol    Types: 1 Standard drinks or equivalent per week    Comment: rare twice month   Drug use: No   Sexual  activity: Yes    Partners: Male  Other Topics Concern   Not on file  Social History Narrative   Caffiene 2 cups daily.    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[employer]   Social Determinants of Corporate investment banker Strain: Low Risk  (02/12/2022)   Overall Financial Resource Strain (CARDIA)    Difficulty of Paying Living Expenses: Not hard at all  Food Insecurity: No Food Insecurity (02/12/2022)   Hunger Vital Sign    Worried About Running Out of Food in the Last Year: Never true    Ran Out of Food in the Last Year: Never true  Transportation Needs: No Transportation Needs (02/12/2022)   PRAPARE - Administrator, Civil Service (Medical): No    Lack of Transportation (Non-Medical): No  Physical Activity: Sufficiently Active (02/12/2022)   Exercise Vital Sign    Days of Exercise per Week: 7 days    Minutes of Exercise per Session: 30 min  Stress: No Stress Concern Present (02/12/2022)   [employer] of  Occupational Health - Occupational Stress Questionnaire    Feeling of Stress : Not at all  Social Connections: Socially Integrated (02/12/2022)   Social Connection and Isolation Panel [NHANES]    Frequency of Communication with Friends and Family: More than three times a week    Frequency of Social Gatherings with Friends and Family: More than three times a week    Attends Religious Services: More than 4 times per year    Active Member of [employer] or Organizations: Yes    Attends Engineer, structural: More than 4 times per year    Marital Status: Married  Catering manager Violence: Not At Risk (02/12/2022)   Humiliation, Afraid, Rape, and Kick questionnaire    Fear of Current or Ex-Partner: No    Emotionally Abused: No    Physically Abused: No    Sexually Abused: No    Past Surgical History:  Procedure Laterality Date   CATARACT EXTRACTION Right    ESOPHAGOGASTRODUODENOSCOPY (EGD) WITH PROPOFOL N/A 05/02/2019   Procedure: ESOPHAGOGASTRODUODENOSCOPY (EGD) WITH PROPOFOL;  Surgeon: Sheri Form, MD;  Location: [facility];  Service: Endoscopy;  Laterality: N/A;  needing APC available   ESOPHAGOGASTRODUODENOSCOPY (EGD) WITH PROPOFOL N/A 08/11/2021   Procedure: ESOPHAGOGASTRODUODENOSCOPY (EGD) WITH PROPOFOL;  Surgeon: Sheri Form, MD;  Location: [facility];  Service: Gastroenterology;  Laterality: N/A;   FACIAL COSMETIC SURGERY     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   TUBAL LIGATION  1986      Current Outpatient Medications:    albuterol (VENTOLIN HFA) 108 (90 Base) MCG/ACT inhaler, 1-2 puff as needed, Disp: 18 g, Rfl: 3   Artificial Tear Solution (TEARS NATURALE OP), Place 1 drop into both eyes See admin instructions. Use 1 drop into both eyes (scheduled) in the morning & may use 3 times daily if needed for dry /irritated eyes., Disp: , Rfl:    atorvastatin (LIPITOR) 40 MG tablet, Take 1 tablet (40 mg total) by mouth daily. ANNUAL APPOINTMENT  DUE MUST SEE PROVIDER FOR REFILLS, Disp: 90 tablet, Rfl: 0   azelastine (OPTIVAR) 0.05 % ophthalmic solution, INSTILL 1 DROP IN BOTH EYES TWICE DAILY AS NEEDED, Disp: 18 mL, Rfl: 0   Azelastine HCl 0.15 % SOLN, USE 2 SPRAYS IN EACH NOSTRIL IN THE MORNING AND AT BEDTIME, Disp: 30 mL, Rfl: 0   benzonatate (TESSALON PERLES) 100 MG capsule, Take 1 capsule (100 mg total) by mouth 3 (three)  times daily as needed., Disp: 30 capsule, Rfl: 0   clonazePAM (KLONOPIN) 0.25 MG disintegrating tablet, Take 1 tablet (0.25 mg total) by mouth daily as needed for seizure. DISSOLVE 1 TABLET ON THE TONGUE EVERY DAY AS NEEDED, Disp: 15 tablet, Rfl: 0   EPIPEN 2-PAK 0.3 MG/0.3ML SOAJ injection, Inject 0.3 mg into the muscle as needed for anaphylaxis. , Disp: , Rfl: 1   escitalopram (LEXAPRO) 20 MG tablet, Take 1 tablet (20 mg total) by mouth daily., Disp: 90 tablet, Rfl: 3   estrogen, conjugated,-medroxyprogesterone (PREMPRO) 0.45-1.5 MG per tablet, Take 1 tablet by mouth at bedtime. , Disp: , Rfl:    fexofenadine (ALLEGRA ALLERGY) 180 MG tablet, 1 tablet Orally Twice a day x 7days then decrease to once a day, Disp: , Rfl:    fluticasone (FLONASE) 50 MCG/ACT nasal spray, Place 2 sprays into both nostrils in the morning and at bedtime., Disp: , Rfl:    hydroxychloroquine (PLAQUENIL) 200 MG tablet, Take 1 tablet (200 mg total) by mouth daily., Disp: 90 tablet, Rfl: 1   ipratropium (ATROVENT) 0.03 % nasal spray, Place 2 sprays into both nostrils every 12 (twelve) hours., Disp: 30 mL, Rfl: 12   lansoprazole (PREVACID) 30 MG capsule, TAKE 1 CAPSULE(30 MG) BY MOUTH TWICE DAILY BEFORE A MEAL, Disp: 60 capsule, Rfl: 6   losartan (COZAAR) 50 MG tablet, Take 1 tablet (50 mg total) by mouth daily., Disp: 90 tablet, Rfl: 3   meloxicam (MOBIC) 15 MG tablet, Take 1 tablet (15 mg total) by mouth daily as needed for pain., Disp: 30 tablet, Rfl: 0   montelukast (SINGULAIR) 10 MG tablet, Take 1 tablet (10 mg total) by mouth at bedtime.,  Disp: 90 tablet, Rfl: 3   Multiple Vitamin (MULTIVITAMIN) capsule, Take 1 capsule by mouth daily., Disp: , Rfl:    mupirocin ointment (BACTROBAN) 2 %, Apply 1 Application topically 2 (two) times daily., Disp: 22 g, Rfl: 0   Olopatadine HCl 0.7 % SOLN, [place] 0.7 % eye drops INSTILL 1 DROP INTO OU QD, Disp: , Rfl:    pantoprazole (PROTONIX) 40 MG tablet, 1 tablet Orally Once a day for 30 day(s), Disp: , Rfl:    Probiotic Product (PROBIOTIC COLON SUPPORT PO), Take 1 capsule by mouth at bedtime. , Disp: , Rfl:    TRELEGY ELLIPTA 200-62.5-25 MCG/ACT AEPB, Take 1 puff by mouth daily., Disp: , Rfl:    Ubrogepant (UBRELVY) 100 MG TABS, Take 1 tablet (100 mg total) by mouth in the morning and at bedtime. Maximum 200mg  a day., Disp: 4 tablet, Rfl: 0   Zavegepant HCl (ZAVZPRET) 10 MG/ACT SOLN, Place 1 spray into the nose daily as needed., Disp: 6 each, Rfl: 11   Zavegepant HCl (ZAVZPRET) 10 MG/ACT SOLN, Place 10 mg into the nose daily as needed. Deliver one spray in one nostril daily as  needed for Migraine Max 8 sprays monthly (Patient not taking: Reported on 11/17/2022), Disp: 4 each, Rfl: 0    Physical Exam: There were no vitals taken for this visit.   Affect appropriate Healthy:  appears stated age HEENT: normal Neck supple with no adenopathy JVP normal no bruits no thyromegaly Lungs clear with no wheezing and good diaphragmatic motion Heart:  S1/S2 no murmur, no rub, gallop or click PMI normal Abdomen: benighn, BS positve, no tenderness, no AAA no bruit.  No HSM or HJR Distal pulses intact with no bruits No edema Neuro non-focal Skin warm and dry No muscular weakness   Labs:  Lab Results  Component Value Date   WBC 5.4 11/09/2022   HGB 12.9 11/09/2022   HCT 37.7 11/09/2022   MCV 91.3 11/09/2022   PLT 193 11/09/2022   No results for input(s): "NA", "K", "CL", "CO2", "BUN", "CREATININE", "CALCIUM", "PROT", "BILITOT", "ALKPHOS", "ALT", "AST", "GLUCOSE" in the last 168  hours.  Invalid input(s): "LABALBU" Lab Results  Component Value Date   CKTOTAL 79 08/11/2008   CKMB 0.7 08/11/2008   TROPONINI <0.01        NO INDICATION OF MYOCARDIAL INJURY. 08/11/2008    Lab Results  Component Value Date   CHOL 129 08/28/2021   CHOL 158 09/04/2020   CHOL 148 04/26/2020   Lab Results  Component Value Date   HDL 40.70 08/28/2021   HDL 39.50 09/04/2020   HDL 32 (L) 04/26/2020   Lab Results  Component Value Date   LDLCALC 53 08/28/2021   LDLCALC 94 09/04/2020   LDLCALC 89 04/26/2020   Lab Results  Component Value Date   TRIG 176.0 (H) 08/28/2021   TRIG 124.0 09/04/2020   TRIG 153 (H) 04/26/2020   Lab Results  Component Value Date   CHOLHDL 3 08/28/2021   CHOLHDL 4 09/04/2020   CHOLHDL 4.6 (H) 04/26/2020   Lab Results  Component Value Date   LDLDIRECT 102.0 09/06/2018   LDLDIRECT 149.1 02/01/2012   LDLDIRECT 140.2 12/19/2009      Radiology: No results found.   EKG: SR rate 89 normal 01/25/20 12/01/2022 SR rate 94 normal    ASSESSMENT AND PLAN:   1. HLD:  Premature family history of CAD Calcium score 70 th percentile on statin target LDL < 70   2. Chest Pain:  Normal ETT in 2019 CRF;s HTN, HLD, and premature family history see above Normal myovue 05/29/21 no ischemia Observe   3. ILD/Asthma:  No active wheezing continue symbicort PFT;s ok f/u Dr Wynonia Hazard   4. GERD:  With esophageal dilatation low carb diet Continue pepcid and protonix   5. Rheum:  positive Sjogrens antibody   6. HTN:  improved on losartan   F/U pulmonary  F/U cards in a year   Signed: Charlton Haws 12/01/2022, 1:02 PM

## 2022-12-10 ENCOUNTER — Ambulatory Visit: Payer: PPO | Admitting: Cardiovascular Disease

## 2022-12-26 ENCOUNTER — Other Ambulatory Visit: Payer: Self-pay | Admitting: Gastroenterology

## 2023-02-20 ENCOUNTER — Other Ambulatory Visit: Payer: Self-pay | Admitting: Internal Medicine

## 2023-03-16 ENCOUNTER — Other Ambulatory Visit: Payer: Self-pay | Admitting: Internal Medicine

## 2023-03-17 ENCOUNTER — Encounter: Payer: Self-pay | Admitting: Internal Medicine

## 2023-03-22 ENCOUNTER — Other Ambulatory Visit: Payer: Self-pay | Admitting: Internal Medicine

## 2023-04-09 ENCOUNTER — Other Ambulatory Visit (HOSPITAL_COMMUNITY): Payer: PPO

## 2023-05-09 ENCOUNTER — Encounter: Payer: Self-pay | Admitting: Pulmonary Disease

## 2023-06-29 ENCOUNTER — Telehealth: Payer: Self-pay | Admitting: Internal Medicine

## 2023-06-29 ENCOUNTER — Other Ambulatory Visit: Payer: Self-pay | Admitting: Internal Medicine

## 2023-06-29 DIAGNOSIS — M3501 Sicca syndrome with keratoconjunctivitis: Secondary | ICD-10-CM

## 2023-06-29 NOTE — Telephone Encounter (Signed)
 Contacted Sheri Andrews to schedule their annual wellness visit. Patient declined to schedule AWV at this time. Transferred care to a PCP in [place], [place].  [facility] Care Guide [facility] AWV TEAM Direct Dial: [phone]

## 2023-06-30 NOTE — Telephone Encounter (Signed)
 Last Fill: 11/17/2022  Eye exam: not on file   Labs: 03/29/2023  MPV 6.7  Next Visit: No follow-ups on file.   Last Visit: 11/17/2022  [id number] syndrome with keratoconjunctivitis sicca (HCC)   Current Dose per office note 11/17/2022: hydroxychloroquine 200 mg daily  Attempted to contact the patient regarding a PLQ eye exam. Left a message to call the office back.    If patient needs to schedule a follow up appointment, please advise when the appointment should be scheduled for.  Okay to refill Plaquenil?

## 2023-06-30 NOTE — Telephone Encounter (Signed)
 Should follow up any time now/next available, on long term hydroxychloroquine

## 2023-07-01 NOTE — Telephone Encounter (Signed)
LMOM for patient to call and schedule follow-up appointment.   °

## 2023-08-16 NOTE — Progress Notes (Deleted)
 Patient: Sheri Andrews Date of Birth: 1950/10/30  Reason for Visit: Follow up History from: Patient Primary Neurologist: Tresia Fruit    ASSESSMENT AND PLAN 73 y.o. year old female with likely migraine with aura.  Originally concerning symptoms of monocular vision loss, and new onset headache.  MRI of the brain, MRA of the head, carotid Dopplers were unremarkable.  Triptans are contraindicated due to history of CAD.   HISTORY OF PRESENT ILLNESS: Today 08/16/23 Last saw Dr. Tresia Fruit April 2024, few migraines, excellent benefit with Zavzpret . Had to be appealed with insurance. Ubrelvy  was not as helpful.   HISTORY  Follow-up August 12, 2022: We saw patient several months ago for headaches and vision changes.  At that time my interpretation was likely migraine with aura however given new symptoms of monocular vision loss and new onset headache after 49 we ordered an MRI of the brain and an MRA of the head as well as carotid Dopplers and blood work, we treated her with his abs are present in the clinic and gave her some Ubrelvy .  Carotid Dopplers were also fine along with the MRI of the brain and the MRA of the head.  I reviewed her chart since we last saw her, she saw sports medicine for some back and hip problems, she also saw her pulmonologist and PFTs were within normal limits in March of this year and she stated she was feeling well, she also saw her cardiologist for chest pain and family history of CAD which would make triptans contraindicated  but tried imitrex and maxalt in the past.   No more vision changes. She has had a few migraines and loves the nasal spray. She loves the zavzpret . 4 migraines in a month and spray works great immediately, < 6 total headache days a month.    Reviewed images and findings from MRI of the brain and MRI of the head which were unremarkable as below.    MRI of the brain with and without and MRA head July 09, 2022: The brain parenchyma shows mild age related  changes of chronic small vessel disease and generalized cerebral atrophy. DWI imaging is negative for acute ischemia. SWI images do not show significant microhemorrhages.The subarachnoid spaces and ventricular system appear normal. Cortical sulci and guyri appear normal.Extraaxial brain structures appear normal. Calvarium shows no abnormalities. Orbits appear normal. Paranasal sinuses show mild inflammatory changes and mucus retension cyst /polyp in right maxillary antrum.Pituitary gland and cerebellar tonsils appear normal. Visualized portion of upper cervical spine appears normal. Flow voids of large vessels of intracranial circulation appear normal. Post contrast images do not show any abnormal enhancement.    MRA head IMPRESSION:  Unremarkable MRA of brain wo contrast showing no significant narrowing of large and medium size intracranial arteries.  REVIEW OF SYSTEMS: Out of a complete 14 system review of symptoms, the patient complains only of the following symptoms, and all other reviewed systems are negative.  See HPI  ALLERGIES: Allergies  Allergen Reactions   Amoxicillin  Other (See Comments)    Upset stomach   Wellbutrin [Bupropion] Swelling   Doxycycline Hyclate Other (See Comments)    upset  stomach   Erythromycin Ethylsuccinate Other (See Comments)     upset stomach    HOME MEDICATIONS: Outpatient Medications Prior to Visit  Medication Sig Dispense Refill   albuterol  (VENTOLIN  HFA) 108 (90 Base) MCG/ACT inhaler 1-2 puff as needed 18 g 3   Artificial Tear Solution (TEARS NATURALE OP) Place 1 drop into  both eyes See admin instructions. Use 1 drop into both eyes (scheduled) in the morning & may use 3 times daily if needed for dry /irritated eyes.     atorvastatin  (LIPITOR) 40 MG tablet TAKE ONE TABLET BY MOUTH DAILY. ANNUAL APPOINTMENT DUE MUST SEE PROVIDER. 90 tablet 0   azelastine  (OPTIVAR ) 0.05 % ophthalmic solution INSTILL 1 DROP IN BOTH EYES TWICE DAILY AS NEEDED 18 mL 0    Azelastine  HCl 0.15 % SOLN USE 2 SPRAYS IN EACH NOSTRIL IN THE MORNING AND AT BEDTIME 30 mL 0   benzonatate  (TESSALON  PERLES) 100 MG capsule Take 1 capsule (100 mg total) by mouth 3 (three) times daily as needed. 30 capsule 0   clonazePAM  (KLONOPIN ) 0.25 MG disintegrating tablet Take 1 tablet (0.25 mg total) by mouth daily as needed for seizure. DISSOLVE 1 TABLET ON THE TONGUE EVERY DAY AS NEEDED 15 tablet 0   EPIPEN 2-PAK 0.3 MG/0.3ML SOAJ injection Inject 0.3 mg into the muscle as needed for anaphylaxis.   1   escitalopram  (LEXAPRO ) 20 MG tablet TAKE 1 TABLET(20 MG) BY MOUTH DAILY 90 tablet 3   estrogen, conjugated,-medroxyprogesterone (PREMPRO) 0.45-1.5 MG per tablet Take 1 tablet by mouth at bedtime.      fexofenadine (ALLEGRA ALLERGY) 180 MG tablet 1 tablet Orally Twice a day x 7days then decrease to once a day     fluticasone (FLONASE) 50 MCG/ACT nasal spray Place 2 sprays into both nostrils in the morning and at bedtime.     hydroxychloroquine  (PLAQUENIL ) 200 MG tablet TAKE 1 TABLET(200 MG) BY MOUTH DAILY 30 tablet 0   ipratropium (ATROVENT ) 0.03 % nasal spray Place 2 sprays into both nostrils every 12 (twelve) hours. 30 mL 12   lansoprazole  (PREVACID ) 30 MG capsule TAKE 1 CAPSULE(30 MG) BY MOUTH TWICE DAILY BEFORE A MEAL 60 capsule 6   losartan  (COZAAR ) 50 MG tablet TAKE 1 TABLET(50 MG) BY MOUTH DAILY 90 tablet 3   meloxicam  (MOBIC ) 15 MG tablet Take 1 tablet (15 mg total) by mouth daily as needed for pain. 30 tablet 0   montelukast  (SINGULAIR ) 10 MG tablet Take 1 tablet (10 mg total) by mouth at bedtime. 90 tablet 3   Multiple Vitamin (MULTIVITAMIN) capsule Take 1 capsule by mouth daily.     mupirocin  ointment (BACTROBAN ) 2 % Apply 1 Application topically 2 (two) times daily. 22 g 0   Olopatadine HCl 0.7 % SOLN Pazeo 0.7 % eye drops INSTILL 1 DROP INTO OU QD     pantoprazole  (PROTONIX ) 40 MG tablet 1 tablet Orally Once a day for 30 day(s)     Probiotic Product (PROBIOTIC COLON SUPPORT PO)  Take 1 capsule by mouth at bedtime.      TRELEGY ELLIPTA 200-62.5-25 MCG/ACT AEPB Take 1 puff by mouth daily.     Ubrogepant  (UBRELVY ) 100 MG TABS Take 1 tablet (100 mg total) by mouth in the morning and at bedtime. Maximum 200mg  a day. 4 tablet 0   Zavegepant HCl (ZAVZPRET ) 10 MG/ACT SOLN Place 1 spray into the nose daily as needed. 6 each 11   Zavegepant HCl (ZAVZPRET ) 10 MG/ACT SOLN Place 10 mg into the nose daily as needed. Deliver one spray in one nostril daily as  needed for Migraine Max 8 sprays monthly (Patient not taking: Reported on 11/17/2022) 4 each 0   No facility-administered medications prior to visit.    PAST MEDICAL HISTORY: Past Medical History:  Diagnosis Date   Acid reflux disease    Allergic rhinitis  Anxiety    Asthma    Barrett esophagus    Dyspnea    Dysthymia    Hypercholesterolemia    Hypertension    Irritable bowel syndrome    Nephrolithiasis    Osteopenia    Osteoporosis    Other chest pain    Pulmonary fibrosis (HCC)    Sjogren's disease (HCC)    Status post dilation of esophageal narrowing     PAST SURGICAL HISTORY: Past Surgical History:  Procedure Laterality Date   CATARACT EXTRACTION Right    ESOPHAGOGASTRODUODENOSCOPY (EGD) WITH PROPOFOL  N/A 05/02/2019   Procedure: ESOPHAGOGASTRODUODENOSCOPY (EGD) WITH PROPOFOL ;  Surgeon: Sergio Dandy, MD;  Location: [facility];  Service: Endoscopy;  Laterality: N/A;  needing APC available   ESOPHAGOGASTRODUODENOSCOPY (EGD) WITH PROPOFOL  N/A 08/11/2021   Procedure: ESOPHAGOGASTRODUODENOSCOPY (EGD) WITH PROPOFOL ;  Surgeon: Sergio Dandy, MD;  Location: [facility];  Service: Gastroenterology;  Laterality: N/A;   FACIAL COSMETIC SURGERY     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   TUBAL LIGATION  1986    FAMILY HISTORY: Family History  Problem Relation Age of Onset   Heart disease Mother    Diabetes Mother    Heart disease Father    Diabetes Sister    Heart disease  Sister    Diabetes Brother    Heart disease Brother    Heart disease Brother    Cancer Maternal Uncle        type unknown   Throat cancer Paternal Uncle    Colon cancer Neg Hx    Stomach cancer Neg Hx    Rectal cancer Neg Hx    Pancreatic cancer Neg Hx    Migraines Neg Hx     SOCIAL HISTORY: Social History   Socioeconomic History   Marital status: Married    Spouse name: Not on file   Number of children: 2   Years of education: Not on file   Highest education level: Not on file  Occupational History   Occupation: retired  Tobacco Use   Smoking status: Never    Passive exposure: Never   Smokeless tobacco: Never  Vaping Use   Vaping status: Never Used  Substance and Sexual Activity   Alcohol use: Yes    Alcohol/week: 1.0 standard drink of alcohol    Types: 1 Standard drinks or equivalent per week    Comment: rare twice month   Drug use: No   Sexual activity: Yes    Partners: Male  Other Topics Concern   Not on file  Social History Narrative   Caffiene 2 cups daily.    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PR   Social Drivers of Corporate investment banker Strain: Low Risk  (02/12/2022)   Overall Financial Resource Strain (CARDIA)    Difficulty of Paying Living Expenses: Not hard at all  Food Insecurity: No Food Insecurity (02/12/2022)   Hunger Vital Sign    Worried About Running Out of Food in the Last Year: Never true    Ran Out of Food in the Last Year: Never true  Transportation Needs: No Transportation Needs (02/12/2022)   PRAPARE - Administrator, Civil Service (Medical): No    Lack of Transportation (Non-Medical): No  Physical Activity: Sufficiently Active (02/12/2022)   Exercise Vital Sign    Days of Exercise per Week: 7 days    Minutes of Exercise per Session: 30 min  Stress: No Stress Concern Present (02/12/2022)   [employer] of  Occupational Health - Occupational Stress Questionnaire    Feeling of Stress : Not at all  Social Connections:  Socially Integrated (02/12/2022)   Social Connection and Isolation Panel [NHANES]    Frequency of Communication with Friends and Family: More than three times a week    Frequency of Social Gatherings with Friends and Family: More than three times a week    Attends Religious Services: More than 4 times per year    Active Member of [employer] or Organizations: Yes    Attends Engineer, structural: More than 4 times per year    Marital Status: Married  Catering manager Violence: Not At Risk (02/12/2022)   Humiliation, Afraid, Rape, and Kick questionnaire    Fear of Current or Ex-Partner: No    Emotionally Abused: No    Physically Abused: No    Sexually Abused: No    PHYSICAL EXAM  There were no vitals filed for this visit. There is no height or weight on file to calculate BMI.  Generalized: Well developed, in no acute distress  Neurological examination  Mentation: Alert oriented to time, place, history taking. Follows all commands speech and language fluent Cranial nerve II-XII: Pupils were equal round reactive to light. Extraocular movements were full, visual field were full on confrontational test. Facial sensation and strength were normal. Uvula tongue midline. Head turning and shoulder shrug  were normal and symmetric. Motor: The motor testing reveals 5 over 5 strength of all 4 extremities. Good symmetric motor tone is noted throughout.  Sensory: Sensory testing is intact to soft touch on all 4 extremities. No evidence of extinction is noted.  Coordination: Cerebellar testing reveals good finger-nose-finger and heel-to-shin bilaterally.  Gait and station: Gait is normal. Tandem gait is normal. Romberg is negative. No drift is seen.  Reflexes: Deep tendon reflexes are symmetric and normal bilaterally.   DIAGNOSTIC DATA (LABS, IMAGING, TESTING) - I reviewed patient records, labs, notes, testing and imaging myself where available.  Lab Results  Component Value Date   WBC 5.4  11/09/2022   HGB 12.9 11/09/2022   HCT 37.7 11/09/2022   MCV 91.3 11/09/2022   PLT 193 11/09/2022      Component Value Date/Time   NA 141 11/09/2022 1425   NA 142 06/17/2022 1214   K 3.9 11/09/2022 1425   CL 106 11/09/2022 1425   CO2 27 11/09/2022 1425   GLUCOSE 92 11/09/2022 1425   BUN 14 11/09/2022 1425   BUN 16 06/17/2022 1214   CREATININE 0.97 11/09/2022 1425   CALCIUM  9.4 11/09/2022 1425   PROT 6.7 11/09/2022 1425   PROT 7.0 06/17/2022 1214   ALBUMIN 4.6 06/17/2022 1214   AST 21 11/09/2022 1425   ALT 14 11/09/2022 1425   ALKPHOS 50 06/17/2022 1214   BILITOT 0.4 11/09/2022 1425   BILITOT 0.3 06/17/2022 1214   GFRNONAA >60 08/29/2021 1315   GFRAA  08/11/2008 0542    >60        The eGFR has been calculated using the MDRD equation. This calculation has not been validated in all clinical situations. eGFR's persistently <60 mL/min signify possible Chronic Kidney Disease.   Lab Results  Component Value Date   CHOL 129 08/28/2021   HDL 40.70 08/28/2021   LDLCALC 53 08/28/2021   LDLDIRECT 102.0 09/06/2018   TRIG 176.0 (H) 08/28/2021   CHOLHDL 3 08/28/2021   Lab Results  Component Value Date   HGBA1C 5.9 08/28/2021   No results found for: "VITAMINB12" Lab Results  Component Value Date   TSH 2.120 06/17/2022    Jeanmarie Millet, AGNP-C, DNP 08/16/2023, 4:20 PM [facility] [address] [place], [place] [address] [phone]

## 2023-08-17 ENCOUNTER — Ambulatory Visit: Payer: PPO | Admitting: Neurology

## 2023-08-17 ENCOUNTER — Encounter: Payer: Self-pay | Admitting: Neurology

## 2023-09-06 IMAGING — CT CT CHEST HIGH RESOLUTION
2 of 7 series · 14 of 36 positions shown, 17 images · non-contrast
Comparison: Cardiac CT 01/25/2020.  Chest CTA 08/10/2008.

CLINICAL DATA: 70-year-old female with history of shortness of
breath. Abnormal pulmonary function tests. Asthma exacerbation.

EXAM:
CT CHEST WITHOUT CONTRAST
TECHNIQUE: Multidetector CT imaging of the chest was performed following the
standard protocol without intravenous contrast. High resolution
imaging of the lungs, as well as inspiratory and expiratory imaging,
was performed.

[Series 4: high resolution · axial · 0.55mm/px · z∈[-199,+53]mm · 11 of 304 slices shown, 14 images]
[im 26/304  mediastinal]
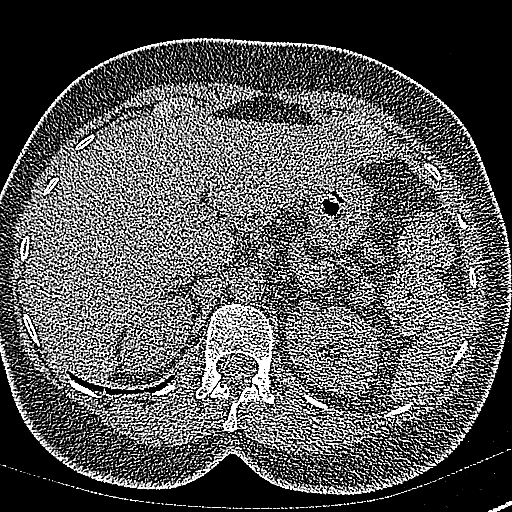
[im 26/304  lung]
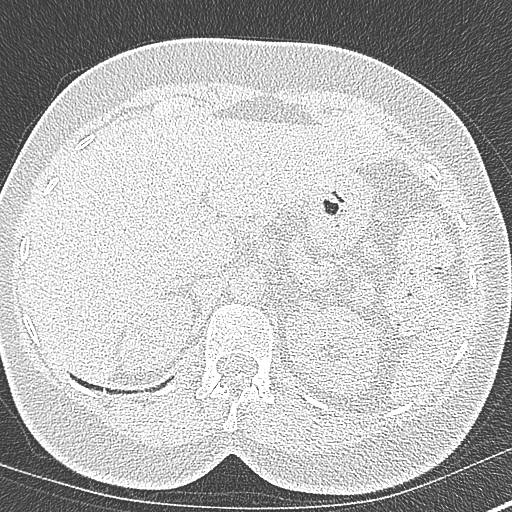
[im 51/304  lung]
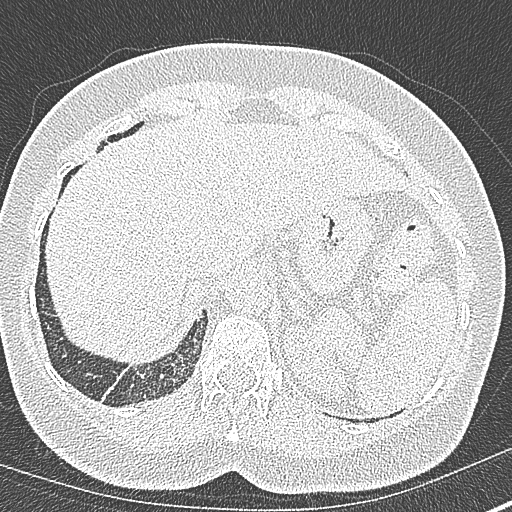
[im 76/304  lung]
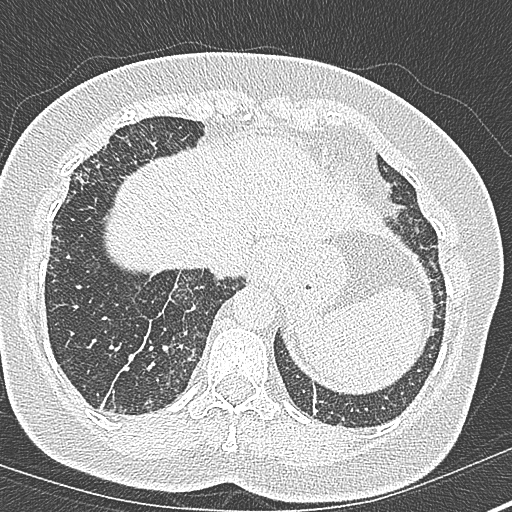
[im 102/304  lung]
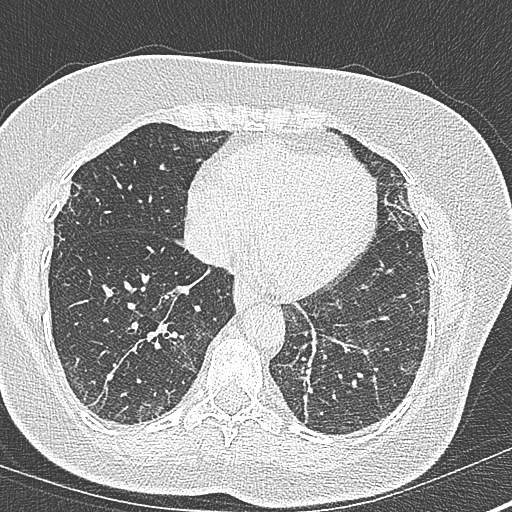
[im 127/304  mediastinal]
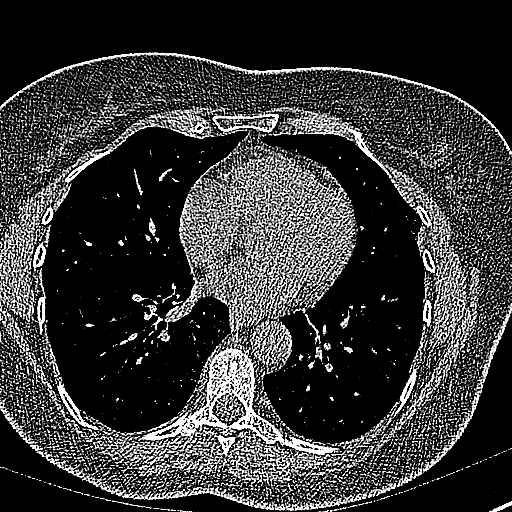
[im 127/304  lung]
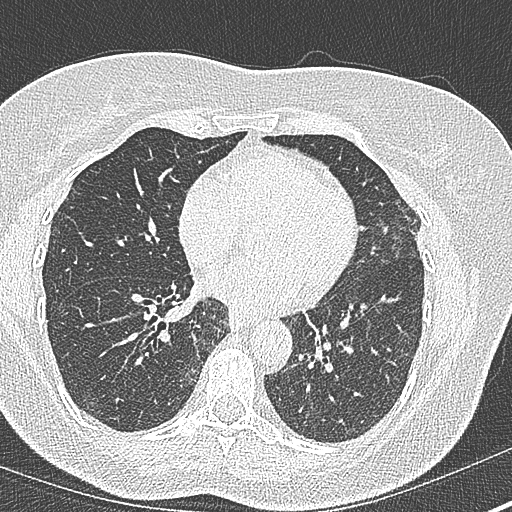
[im 152/304  lung]
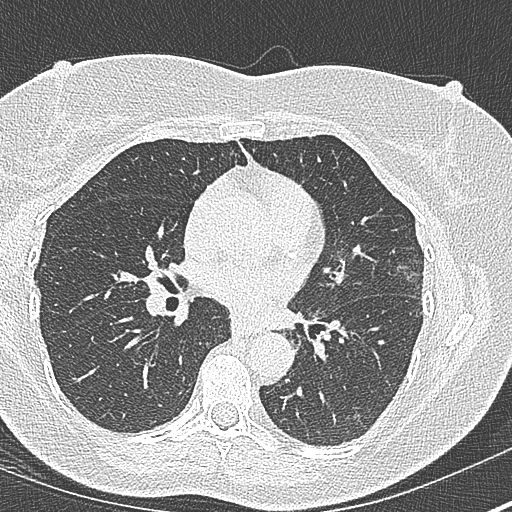
[im 177/304  lung]
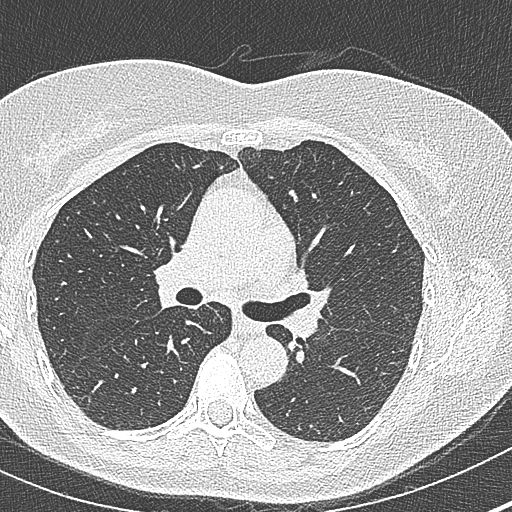
[im 203/304  lung]
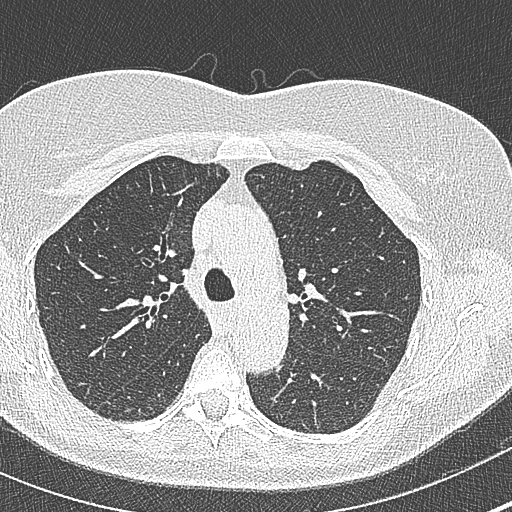
[im 228/304  mediastinal]
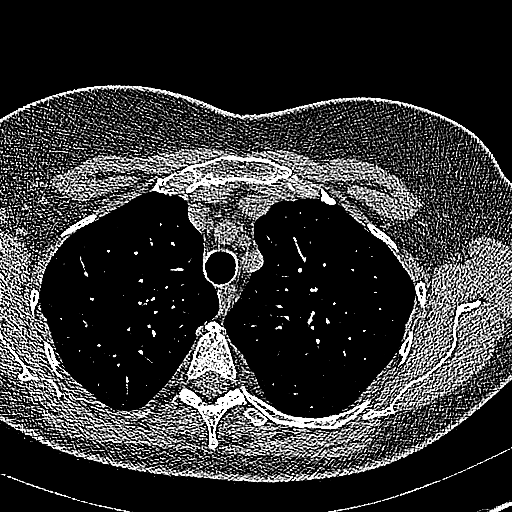
[im 228/304  lung]
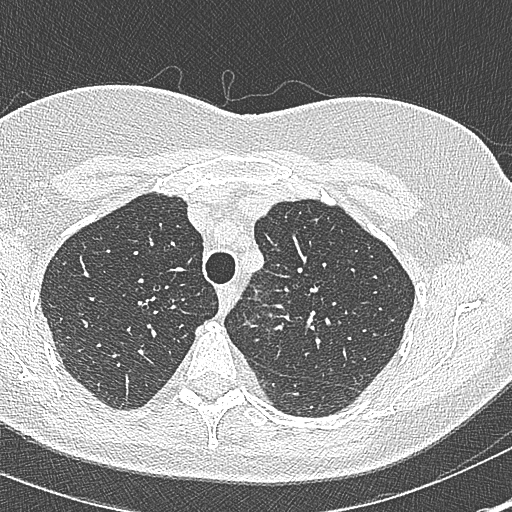
[im 253/304  lung]
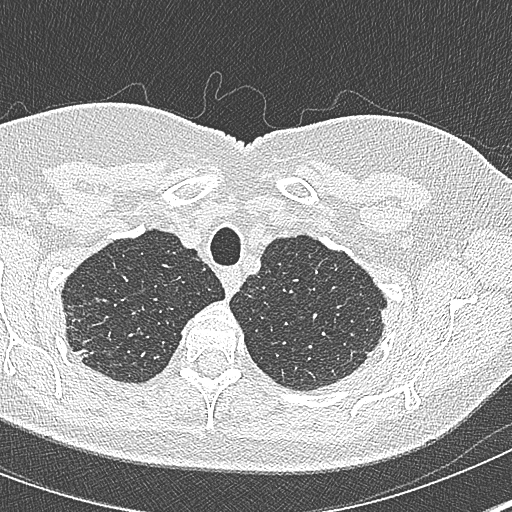
[im 278/304  lung]
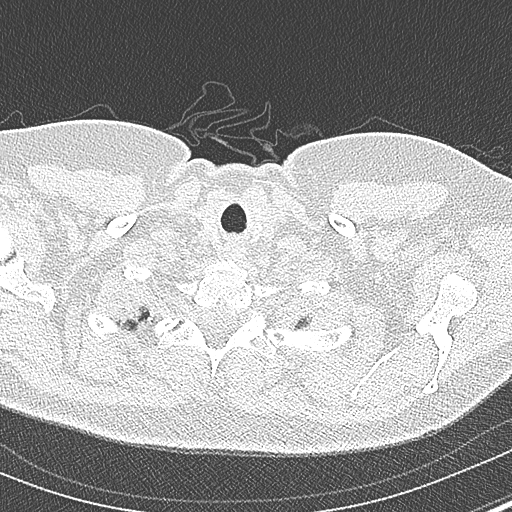

[Series 6: coronal · coronal · 0.59mm/px · 3 of 113 slices shown]
[im 23/113  lung]
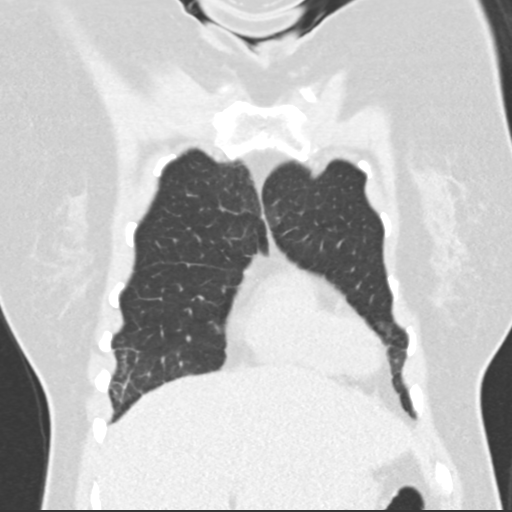
[im 45/113  lung]
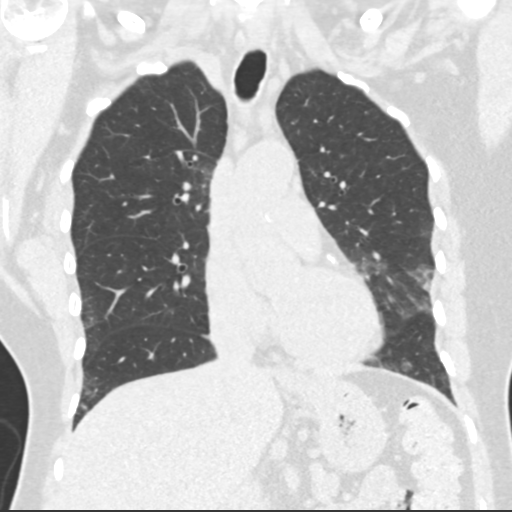
[im 68/113  lung]
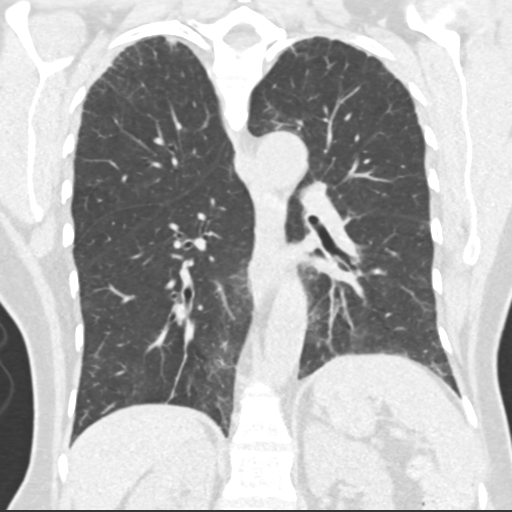

[14 of 36 positions shown; findings below may reference images not displayed]

FINDINGS: Cardiovascular: Heart size is normal. There is no significant
pericardial fluid, thickening or pericardial calcification. There is
aortic atherosclerosis, as well as atherosclerosis of the great
vessels of the mediastinum and the coronary arteries, including
calcified atherosclerotic plaque in the left main, left anterior
descending and right coronary arteries.

Mediastinum/Nodes: No pathologically enlarged mediastinal or hilar
lymph nodes. Please note that accurate exclusion of hilar adenopathy
is limited on noncontrast CT scans. Esophagus is unremarkable in
appearance. No axillary lymphadenopathy.

Lungs/Pleura: High-resolution images demonstrates some patchy areas
of ground-glass attenuation, septal thickening and parenchymal
banding in the lungs bilaterally, most evident in the mid to lower
lungs. Very mild cylindrical traction bronchiectasis and peripheral
bronchiolectasis is also noted. No frank honeycombing. Inspiratory
and expiratory imaging demonstrates some mild air trapping
indicative of mild small airways disease. No acute consolidative
airspace disease. No pleural effusions. No definite suspicious
appearing pulmonary nodules or masses are noted.

Upper Abdomen: Aortic atherosclerosis.

Musculoskeletal: There are no aggressive appearing lytic or blastic
lesions noted in the visualized portions of the skeleton.
IMPRESSION: 1. The appearance of the lungs is compatible with interstitial lung
disease, with a spectrum of findings considered probable usual
interstitial pneumonia (UIP) per current ATS guidelines. These
findings are clearly progressive compared to prior cardiac CT
01/25/2020. Repeat high-resolution chest CT is suggested in 12
months to assess for temporal changes in the appearance of the lung
parenchyma.
2. Aortic atherosclerosis, in addition to left main and 2 vessel
coronary artery disease. Please note that although the presence of
coronary artery calcium documents the presence of coronary artery
disease, the severity of this disease and any potential stenosis
cannot be assessed on this non-gated CT examination. Assessment for
potential risk factor modification, dietary therapy or pharmacologic
therapy may be warranted, if clinically indicated.

Aortic Atherosclerosis ([id number]).

## 2023-11-22 ENCOUNTER — Other Ambulatory Visit: Payer: Self-pay | Admitting: Neurology

## 2023-11-22 DIAGNOSIS — G43909 Migraine, unspecified, not intractable, without status migrainosus: Secondary | ICD-10-CM

## 2023-11-22 DIAGNOSIS — G43109 Migraine with aura, not intractable, without status migrainosus: Secondary | ICD-10-CM
